# Patient Record
Sex: Female | Born: 1975 | Race: Black or African American | Hispanic: No | Smoking: Former smoker
Health system: Southern US, Community
[De-identification: ages and names within clinical notes are randomized; demographics above are authoritative.]

## PROBLEM LIST (undated history)

## (undated) DIAGNOSIS — G959 Disease of spinal cord, unspecified: Secondary | ICD-10-CM

## (undated) DIAGNOSIS — J189 Pneumonia, unspecified organism: Secondary | ICD-10-CM

## (undated) DIAGNOSIS — M1990 Unspecified osteoarthritis, unspecified site: Secondary | ICD-10-CM

## (undated) DIAGNOSIS — F419 Anxiety disorder, unspecified: Secondary | ICD-10-CM

## (undated) DIAGNOSIS — M5412 Radiculopathy, cervical region: Secondary | ICD-10-CM

## (undated) DIAGNOSIS — M5030 Other cervical disc degeneration, unspecified cervical region: Secondary | ICD-10-CM

## (undated) DIAGNOSIS — I10 Essential (primary) hypertension: Secondary | ICD-10-CM

## (undated) DIAGNOSIS — M797 Fibromyalgia: Secondary | ICD-10-CM

## (undated) DIAGNOSIS — E119 Type 2 diabetes mellitus without complications: Secondary | ICD-10-CM

## (undated) DIAGNOSIS — L501 Idiopathic urticaria: Secondary | ICD-10-CM

## (undated) DIAGNOSIS — G4730 Sleep apnea, unspecified: Secondary | ICD-10-CM

## (undated) DIAGNOSIS — M1380 Other specified arthritis, unspecified site: Secondary | ICD-10-CM

## (undated) DIAGNOSIS — Z9889 Other specified postprocedural states: Secondary | ICD-10-CM

## (undated) DIAGNOSIS — E785 Hyperlipidemia, unspecified: Secondary | ICD-10-CM

## (undated) DIAGNOSIS — M064 Inflammatory polyarthropathy: Secondary | ICD-10-CM

## (undated) DIAGNOSIS — Z8489 Family history of other specified conditions: Secondary | ICD-10-CM

## (undated) DIAGNOSIS — J45909 Unspecified asthma, uncomplicated: Secondary | ICD-10-CM

## (undated) DIAGNOSIS — R112 Nausea with vomiting, unspecified: Secondary | ICD-10-CM

## (undated) DIAGNOSIS — T8859XA Other complications of anesthesia, initial encounter: Secondary | ICD-10-CM

## (undated) DIAGNOSIS — F32A Depression, unspecified: Secondary | ICD-10-CM

## (undated) HISTORY — PX: WISDOM TOOTH EXTRACTION: SHX21

## (undated) HISTORY — PX: ABDOMINAL HYSTERECTOMY: SHX81

## (undated) HISTORY — DX: Inflammatory polyarthropathy: M06.4

## (undated) HISTORY — DX: Unspecified osteoarthritis, unspecified site: M19.90

## (undated) HISTORY — DX: Other specified arthritis, unspecified site: M13.80

## (undated) HISTORY — PX: DILATION AND CURETTAGE OF UTERUS: SHX78

## (undated) HISTORY — DX: Fibromyalgia: M79.7

---

## 1998-09-30 HISTORY — PX: TONSILLECTOMY: SUR1361

## 2014-03-14 DIAGNOSIS — D219 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, unspecified: Secondary | ICD-10-CM

## 2014-03-14 HISTORY — DX: 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, unspecified: D21.9

## 2014-07-25 DIAGNOSIS — F411 Generalized anxiety disorder: Secondary | ICD-10-CM | POA: Insufficient documentation

## 2016-09-30 HISTORY — PX: ABDOMINAL HYSTERECTOMY: SHX81

## 2017-09-28 DIAGNOSIS — D569 Thalassemia, unspecified: Secondary | ICD-10-CM

## 2017-09-28 DIAGNOSIS — I10 Essential (primary) hypertension: Secondary | ICD-10-CM | POA: Insufficient documentation

## 2017-09-28 DIAGNOSIS — J45909 Unspecified asthma, uncomplicated: Secondary | ICD-10-CM | POA: Insufficient documentation

## 2017-09-28 DIAGNOSIS — E119 Type 2 diabetes mellitus without complications: Secondary | ICD-10-CM | POA: Insufficient documentation

## 2017-09-28 HISTORY — DX: Thalassemia, unspecified: D56.9

## 2017-09-29 DIAGNOSIS — R7989 Other specified abnormal findings of blood chemistry: Secondary | ICD-10-CM | POA: Insufficient documentation

## 2017-09-29 DIAGNOSIS — D72829 Elevated white blood cell count, unspecified: Secondary | ICD-10-CM | POA: Insufficient documentation

## 2019-10-11 DIAGNOSIS — M48061 Spinal stenosis, lumbar region without neurogenic claudication: Secondary | ICD-10-CM | POA: Insufficient documentation

## 2019-10-11 DIAGNOSIS — E785 Hyperlipidemia, unspecified: Secondary | ICD-10-CM | POA: Insufficient documentation

## 2019-10-11 DIAGNOSIS — F3341 Major depressive disorder, recurrent, in partial remission: Secondary | ICD-10-CM | POA: Insufficient documentation

## 2019-10-11 DIAGNOSIS — Z794 Long term (current) use of insulin: Secondary | ICD-10-CM | POA: Insufficient documentation

## 2019-10-11 DIAGNOSIS — L501 Idiopathic urticaria: Secondary | ICD-10-CM | POA: Insufficient documentation

## 2019-10-11 DIAGNOSIS — M5136 Other intervertebral disc degeneration, lumbar region: Secondary | ICD-10-CM | POA: Insufficient documentation

## 2019-10-11 DIAGNOSIS — M1990 Unspecified osteoarthritis, unspecified site: Secondary | ICD-10-CM | POA: Insufficient documentation

## 2019-10-11 DIAGNOSIS — E119 Type 2 diabetes mellitus without complications: Secondary | ICD-10-CM | POA: Insufficient documentation

## 2020-06-02 ENCOUNTER — Other Ambulatory Visit: Payer: Self-pay

## 2020-06-02 ENCOUNTER — Telehealth: Payer: Self-pay | Admitting: Registered Nurse

## 2020-06-02 ENCOUNTER — Ambulatory Visit: Payer: Self-pay | Admitting: Medical

## 2020-06-02 ENCOUNTER — Encounter: Payer: Self-pay | Admitting: Registered Nurse

## 2020-06-02 ENCOUNTER — Ambulatory Visit: Payer: Self-pay

## 2020-06-02 VITALS — HR 95 | Resp 16

## 2020-06-02 DIAGNOSIS — J45901 Unspecified asthma with (acute) exacerbation: Secondary | ICD-10-CM

## 2020-06-02 DIAGNOSIS — F411 Generalized anxiety disorder: Secondary | ICD-10-CM

## 2020-06-02 DIAGNOSIS — Z794 Long term (current) use of insulin: Secondary | ICD-10-CM

## 2020-06-02 DIAGNOSIS — F3341 Major depressive disorder, recurrent, in partial remission: Secondary | ICD-10-CM

## 2020-06-02 DIAGNOSIS — Z20822 Contact with and (suspected) exposure to covid-19: Secondary | ICD-10-CM

## 2020-06-02 DIAGNOSIS — O24419 Gestational diabetes mellitus in pregnancy, unspecified control: Secondary | ICD-10-CM | POA: Insufficient documentation

## 2020-06-02 DIAGNOSIS — J019 Acute sinusitis, unspecified: Secondary | ICD-10-CM

## 2020-06-02 DIAGNOSIS — E785 Hyperlipidemia, unspecified: Secondary | ICD-10-CM

## 2020-06-02 DIAGNOSIS — I10 Essential (primary) hypertension: Secondary | ICD-10-CM

## 2020-06-02 DIAGNOSIS — E119 Type 2 diabetes mellitus without complications: Secondary | ICD-10-CM

## 2020-06-02 LAB — POC COVID19 BINAXNOW: SARS Coronavirus 2 Ag: NEGATIVE

## 2020-06-02 MED ORDER — LISINOPRIL 10 MG PO TABS
10.0000 mg | ORAL_TABLET | Freq: Every day | ORAL | 0 refills | Status: DC
Start: 2020-06-02 — End: 2022-07-16

## 2020-06-02 MED ORDER — SALINE SPRAY 0.65 % NA SOLN: 2.0000 | NASAL | 0 refills | Status: DC

## 2020-06-02 MED ORDER — FLUTICASONE PROPIONATE 50 MCG/ACT NA SUSP
1.0000 | Freq: Two times a day (BID) | NASAL | 0 refills | Status: DC
Start: 2020-06-02 — End: 2021-11-12

## 2020-06-02 MED ORDER — SERTRALINE HCL 100 MG PO TABS: 200.0000 mg | ORAL_TABLET | Freq: Every day | ORAL | 0 refills | Status: DC

## 2020-06-02 MED ORDER — ATORVASTATIN CALCIUM 40 MG PO TABS
40.0000 mg | ORAL_TABLET | Freq: Every day | ORAL | 0 refills | Status: DC
Start: 2020-06-02 — End: 2024-03-19

## 2020-06-02 MED ORDER — TRULICITY 0.75 MG/0.5ML ~~LOC~~ SOAJ
0.7500 mg | SUBCUTANEOUS | 0 refills | Status: DC
Start: 2020-06-02 — End: 2021-01-19

## 2020-06-02 NOTE — Progress Notes (Signed)
Subjective:    Patient ID: Eileen Chaney, female    DOB: Jul 22, 1976, 44 y.o.   MRN: [id number]  44y/o female new patient here for covid testing due to symptoms chest tightness, rhinitis, swollen glands, feeling hot, chills, and body aches.  Fully covid vaccinated second pfizer vaccine 12/27/2019 per patient.  Went to UC yesterday due to leg swelling denied fever/rash stated swelling was making it difficult to walk.  Provider felt it was due to hyperglycemia and refilled her metformin but not her trulicity stating provider could not fill injectables.  Patient reported she is also running low on lisinopril, atorvastatin and zoloft and would like refills today if possible.  Labs last completed with [facility] Jan 2021 [facility].  Last Hgba1c 7.4  Not using her continuous glucose monitor as not getting accurate readings per patient. She has not established care with new PCM after starting new job at [employer].  She was scheduled to receive her covid booster vaccine today dose 3 and she is wondering if she should reschedule.  Patient did not take temperature at home could not find thermometer due to recently moved still unpacking.  Felt like she had fever.  Feeling hot/cold chills.  Denied thoughts of HI/SI states her zoloft has been working well and wondering if she can stop it now as not feeling depressed/anxious.  Denied PMHx gout, insect bite, trauma to right leg, clothes touching swollen leg not painful, no rash observed, skin warm dry and pink and only one leg affected.  Patient had rapid POC covid [employer] testing with RN Maisie Fus in parking lot prior to telehealth visit with me.  Per RN Maisie Fus A&Ox3, respirations even and unlabored skin warm dry and pink and patient easily performed nasal swab for covid testing coordination intact.  Per RN Maisie Fus patient out of car in parking lot but she did not observe gait.     Review of Systems  Constitutional: Positive for chills and fever. Negative  for activity change, appetite change, diaphoresis and fatigue.  HENT: Positive for postnasal drip and rhinorrhea. Negative for facial swelling, trouble swallowing and voice change.   Eyes: Negative for photophobia and visual disturbance.  Respiratory: Positive for cough and chest tightness. Negative for shortness of breath, wheezing and stridor.   Cardiovascular: Positive for leg swelling.  Gastrointestinal: Negative for diarrhea, nausea and vomiting.  Endocrine: Negative for polydipsia, polyphagia and polyuria.  Genitourinary: Negative for difficulty urinating.  Musculoskeletal: Positive for myalgias. Negative for arthralgias, gait problem, neck pain and neck stiffness.  Skin: Negative for color change, rash and wound.  Allergic/Immunologic: Positive for immunocompromised state. Negative for food allergies.  Neurological: Negative for dizziness, tremors, seizures, syncope, facial asymmetry, speech difficulty, weakness, light-headedness, numbness and headaches.  Hematological: Positive for adenopathy.  Psychiatric/Behavioral: Negative for agitation, confusion and sleep disturbance.       Objective:   Physical Exam Vitals and nursing note reviewed.  Constitutional:      General: She is awake. She is not in acute distress.    Appearance: Normal appearance. She is well-developed and well-groomed. She is morbidly obese. She is not ill-appearing, toxic-appearing or diaphoretic.  HENT:     Head: Normocephalic and atraumatic.     Jaw: There is normal jaw occlusion.     Right Ear: Hearing and external ear normal.     Left Ear: Hearing and external ear normal.     Nose: Nose normal.     Mouth/Throat:     Mouth: No angioedema.  Pharynx: Oropharynx is clear.  Eyes:     General: Lids are normal. Vision grossly intact. Gaze aligned appropriately. No scleral icterus.       Right eye: No discharge.        Left eye: No discharge.     Extraocular Movements: Extraocular movements intact.      Conjunctiva/sclera: Conjunctivae normal.     Pupils: Pupils are equal, round, and reactive to light.  Neck:     Trachea: Trachea and phonation normal.  Cardiovascular:     Rate and Rhythm: Normal rate and regular rhythm.  Pulmonary:     Effort: Pulmonary effort is normal. No respiratory distress.     Breath sounds: Normal breath sounds and air entry. No stridor or transmitted upper airway sounds. No wheezing.     Comments: Spoke full sentences without difficulty; no cough audible during telephone conversation Musculoskeletal:        General: No deformity or signs of injury. Normal range of motion.     Right shoulder: No swelling, deformity or laceration. Normal range of motion.     Left shoulder: No swelling, deformity or laceration. Normal range of motion.     Right elbow: No swelling, deformity or lacerations. Normal range of motion.     Left elbow: No swelling, deformity or lacerations. Normal range of motion.     Right hand: No swelling, deformity or lacerations. Normal range of motion.     Left hand: No swelling, deformity or lacerations. Normal range of motion.     Cervical back: Normal range of motion. No swelling, edema, deformity, erythema, signs of trauma, lacerations or rigidity. Normal range of motion.     Thoracic back: No swelling, edema, deformity, signs of trauma or lacerations. Normal range of motion.  Skin:    General: Skin is warm and dry.     Coloration: Skin is not ashen, cyanotic, jaundiced, mottled, pale or sallow.     Findings: No abrasion, abscess, acne, bruising, burn, ecchymosis, erythema, signs of injury, laceration, lesion, petechiae, rash or wound.     Nails: There is no clubbing.  Neurological:     General: No focal deficit present.     Mental Status: She is alert and oriented to person, place, and time. Mental status is at baseline.     GCS: GCS eye subscore is 4. GCS verbal subscore is 5. GCS motor subscore is 6.     Cranial Nerves: No cranial nerve  deficit, dysarthria or facial asymmetry.     Motor: Motor function is intact. No weakness, tremor, atrophy, abnormal muscle tone or seizure activity.     Coordination: Coordination is intact. Coordination normal.     Comments: Bilateral hand grasp equal  Psychiatric:        Attention and Perception: Attention and perception normal.        Mood and Affect: Mood and affect normal.        Speech: Speech normal.        Behavior: Behavior normal. Behavior is cooperative.        Thought Content: Thought content normal.        Cognition and Memory: Cognition and memory normal.        Judgment: Judgment normal.      Specimen:  Blood  Ref Range & Units 7 mo ago Comments  Hemoglobin A1c 4.8 - 5.6 % 7.4High      Prediabetes: 5.7 - 6.4      Diabetes: >6.4  Glycemic control for adults with diabetes: <7.0      Resulting Agency  [facility]   Narrative Performed by [facility] Performed at: [address]  [address], [place], [place] [id number]  Lab Director: Rush Farmer MD, Phone: [phone] Specimen Collected: 10/11/19 9:30 AM Last Resulted: 10/12/19 7:37 AM  Received From: [facility]  Result Received: 06/02/20 10:06 AM   Specimen:  Blood  Ref Range & Units 6 mo ago Comments  Na 136 - 146 mmol/L 140        Potassium 3.7 - 5.4 mmol/L 4.1        Cl 97 - 108 mmol/L 103        CO2 20 - 32 mmol/L 26        Glucose 65 - 99 mg/dL 97        BUN 6 - 24 mg/dL 15        Creatinine 0.57 - 1.00 mg/dL 0.73        Ca 8.7 - 10.2 mg/dL 9.9        ALK PHOS 25 - 150 U/L 136        T Bili 0.00 - 1.20 mg/dL 0.19        Total Protein 6.0 - 8.5 gm/dL 8.2        Alb 3.5 - 5.5 gm/dL 4.6        GLOBULIN 1.5 - 4.5 gm/dL 3.6        ALBUMIN/GLOBULIN RATIO 1.1 - 2.5  1.3        BUN/CREAT RATIO 11.0 - 26.0  20.5        ALT 0 - 40 U/L 14        AST 0 - 40 U/L 14        GFR AFRICAN AMERICAN mL/min/1.5m2 115  African-American:  Normal GFR (glomerular  filtration rate) > 60 mL/min/1.73 meters squared. < 60 may include impaired kidney function based on creatinine, age, legal sex, and race normalized to accepted average body surface area      GFR Non African American mL/min/1.62m2 100  Non African American:  Normal GFR (glomerular filtration rate) > 60 mL/min/1.73 meters squared. < 60 may include impaired kidney function based on creatinine, age, legal sex, and race normalized to accepted average body surface area.      AGAP 7 - 16 mmol/L 11        Resulting Agency  [facility]   Specimen Collected: 11/18/19 11:33 PM Last Resulted: 11/19/19 12:09 AM  Received From: [facility]  Result Received: 06/02/20 10:06 AM  Encounter Summary  Recent Data from [facility] to Comprehensive Metabolic Panel Component 17/61/60 10/11/19  Na 140 --  Potassium 4.1 4.4  Cl 103 --  CO2 26 21  Glucose 97 103High  BUN 15 10  Creatinine 0.73 0.72  Ca 9.9 --  ALK PHOS 136 --  T Bili 0.19 --  Total Protein 8.2 7.4  Alb 4.6 --  GLOBULIN 3.6 --  ALBUMIN/GLOBULIN RATIO 1.3 --  BUN/CREAT RATIO 20.5 --  ALT 14 --  AST 14 19  GFR AFRICAN AMERICAN 115  --  GFR Non African American 100  --  AGAP 11 --  ALT (SGPT) -- 12  Albumin, Serum -- 4.4  Albumin/Globulin Ratio -- 1.5  Alkaline Phosphatase -- 140High  BUN/Creatinine Ratio -- 14  CALCIUM -- 9.7  Chloride -- 103  Globulin, Total -- 3.0  Sodium -- 138  Total Bilirubin -- 0.3  eGFR  If African American -- 117  eGFR If NonAfrican American -- 101    Ref Range & Units 7 mo ago  Cholesterol, Total 100 - 199 mg/dL 241High      Triglycerides 0 - 149 mg/dL 114      HDL >39 mg/dL 50      VLDL Cholesterol Cal 5 - 40 mg/dL 20      LDL 0 - 99 mg/dL 171High      Resulting Agency  [facility]  Narrative Performed by [facility] Performed at: [address]  [address], [place], [place] [id number]  Lab Director: Rush Farmer MD, Phone: [phone] Specimen  Collected: 10/11/19 9:30 AM Last Resulted: 10/12/19 7:37 AM  Received From: [facility]  Result Received: 06/02/20 10:06 AM         Assessment & Plan:  A-covid testing, acute rhinosinusitis, hyperlipidemia, hypertension, type II diabetes with insulin use, GAD, depression major, mild intermittent asthma with exacerbation, leg swelling, morbid obesity  P-Patient notified via telephone negative Covid rapid test result.  PCR test ordered, collected under observation by RN Maisie Fus and sent to [place].  Patient to continue quarantine at home due to symptoms, do not return to work at this time as possible fever and URI symptoms in past 24 hours.  Fully vaccinated.  Continue to wear mask when out in public and [place] policy to wear while at work.  Refer to [employer] if further questions regarding covid prevention policies when on [employer].  Monitor for symptoms of covid e.g. runny nose, sore throat, headache, loss of taste/smell, nausea/vomiting/diarrhea, fever/chills, body aches, cough, shortness of breath/dyspnea.  Patient to notify clinic staff if new symptoms develop.  Discussed repeat testing may be indicated if new symptoms develop.  Discussed current research has shown that breakthrough covid infections are occurring with the delta variant but typically do not require hospitalization for fully vaccinated immunocompetent patients.  Discussed with patient expected 8 month covid booster vaccinatations to start late September 2021 but she will not be due until end of November/beginning of December with current guidance.  Patient reported she had already scheduled her covid booster for today but wondering if she needs to reschedule.  Discussed with patient if fever greater than 100.5/chills it is recommended she reschedule vaccination to a day she is afebrile.  Patient has been unable to find thermometer at home so hasn't actually had known fever.  Discussed with patient if feeling hot  this weekend to take her temperature with thermoeter.  Allergic to tylenol may use OTC motrin $RemoveBe'800mg'ofUQiRMXP$  po BID prn pain take with food.  NSAIDS can counteract her blood pressure medication and can cause kidney strain so to use sparingly. Discussed with patient covid spread and incidence in community high.  Continue to protect self with mask wear, hand sanitizing washing, avoid touching face and maintaining social distancing 6 feet or greater.  Exitcare handouts on covid FAQ and covid testing. If patient has any further questions or concerns she can send me message via mychart also.  [facility] open M-F.  Patient verbalized understanding of information/instructions, agreed with plan of care and had no further questions at this time.    Patient may use normal saline nasal spray 2 sprays each nostril q2h wa as needed. flonase 78mcg 1 spray each nostril BID #1 RF0.  Patient denied personal or family history of ENT cancer.  OTC antihistamine of choice zyrtec $RemoveBefor'10mg'hTrzRkEroucN$  po daily.  Avoid triggers if possible.  Shower prior to  bedtime if exposed to triggers.  If allergic dust/dust mites recommend mattress/pillow covers/encasements; washing linens, vacuuming, sweeping, dusting weekly.  Call or return to clinic as needed if these symptoms worsen or fail to improve as anticipated.   Exitcare handouts on allergic rhinitis, nonallergic rhinitis and sinus rinse.  Patient verbalized understanding of instructions, agreed with plan of care and had no further questions at this time.  P2:  Avoidance and hand washing.  Some cough nonproductive and chest tightness prior to cough that relieves with cough.  Consider prednisone burst 10mg  sig 3 tabs daily po with breakfast x 4 days as history of mild intermittent asthma and type II diabetes lower prednisone dose.  Post nasal drip irritation? bronchitis  Discussed possible side effects increased/decreased appetite, difficulty sleeping, increased blood sugar, increased blood  pressure and heart rate.  Take advair diskus 100/37mcg 1 puff BID on regular schedule not prn this week at home doesn't need refill.  Albuterol MDI 17mcg 1-2 puffs po q4-6h prn protracted cough/wheeze (at home doesn't have refill) side effect increased heart rate/hand tremors. Bronchitis simple, community acquired, may have started as viral (probably respiratory syncytial, parainfluenza, influenza, or adenovirus), but now evidence of acute purulent bronchitis with resultant bronchial edema and mucus formation.  Viruses are the most common cause of bronchial inflammation in otherwise healthy adults with acute bronchitis.  The appearance of sputum is not predictive of whether a bacterial infection is present.  Purulent sputum is most often caused by viral infections.  There are a small portion of those caused by non-viral agents being Mycoplama pneumonia.  Microscopic examination or C&S of sputum in the healthy adult with acute bronchitis is generally not helpful (usually negative or normal respiratory flora) other considerations being cough from upper respiratory tract infections, sinusitis or allergic syndromes (mild asthma or viral pneumonia).  Differential Diagnoses:  reactive airway disease (asthma, allergic aspergillosis (eosinophilia), chronic bronchitis, respiratory infection (sinusitis, common cold, pneumonia), congestive heart failure, reflux esophagitis, bronchogenic tumor, aspiration syndromes and/or exposure to pulmonary irritants/smoke.  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  Without high fever, severe dyspnea, lack of physical findings or other risk factors, I will hold on a chest radiograph and CBC at this time.  I discussed that approximately 50% of patients with acute bronchitis have a cough that lasts up to three weeks, and 25% for over a month.  Exitcare handouts on asthma attack prevention and inhaler use.  ER if hemopthysis, SOB, worst chest pain of  life.   Patient instructed to follow up if symptoms worsen.  Patient verbalized agreement and understanding of treatment plan and had no further questions at this time.  P2:  hand washing and cover cough  Continue atorvastatin 40mg  po daily #90 RF0 sent electronically to his pharmacy of choice today.   Discussed need to establish care with new PCM and annual labs due Jan 2022 for lipids.  Patient stopped omega 3/fish oil supplement.  Encouraged lifestyle modification re diet to decrease white starches/sugars e.g. potatoes/bread increase omega 3 sources nuts, fish options add more fruits and vegetables and lean protein choices such as hummus or yogurt. Exitcare handout on high cholesterol. Consider weight loss and increased dietary fiber.  Patient agreed with plan of care and verbalized understanding of information/instructions and had no further questions at this time.   Continue current medications as directed. Electronic Rx lisinopril 10mg  po daily #90 RF0 to her pharmacy of choice.  Establish care with  new PCM.  Consider weight loss.  Consider low sodium/DASH diet and exercise program 150 minutes exercise/activity per week.  Recommend weight loss  Reurn to the clinic if any new symptoms/notify clinic staff if visual changes, frequent headache, chest pain or dyspnea on mild or  minimal exertion. Exitcare handout on managing hypertension.  Patient verbalized agreement and understanding of treatment plan and had no further questions at this time. P2: Diet and Exercise specific for HTN  Refilled zoloft 100mg  take 2 tabs po daily #90 RF0 electronic Rx to her pharmacy of choice.  Establish with new PCM.  Do not stop medication cold [place] must be tapered off.  Patient stated anxiety/depression under good control, does not cause problems at work and able to complete all duties. She is thinking she may be able to stop her medication.  Denied S/I or H/I, plan, or ideation. Patient is reliable. Exitcare handouts on  living with depression and anxiety. Return to the clinic if any new or worsening symptoms. Patient verbalized understanding of information/instructions, agreed with plan of care and had no further questions at this time. P2: Diet and Exercise. Stress reduction.  Follow up overdue for Hgba1c (previous PCM wanted to see her again in April 2021 last appt Jan 2021  Hgba1c 7.4.)  Needs microalbumin and repeat Hgba1c.   Normal renal function, alk phos slightly elevated and history of hepatic steatosis on CT abdomen per chart review. Diabetic eye exam due Aug 2021.  Urgent care recently refilled her metformin but would not refill her injectable trulicity 0.75mg  weekly.  Electronic Rx sent to her pharmacy of choice today.  Patient had stopped using continuous glucose monitor as not working well for her.  To discuss with new PCM other options.  I recommend weight loss over the next year but emphasized healthy diet and activity daily main goals.  Continue metformin XR 500mg   Take 4 tabs po daily. Ensure staying hydrated drink water to keep urine pale yellow clear and voiding every 4 hours when awake.  Avoid dehydration. Avoid added sugars in diet/non-nutritional greater than 25gms/5 teaspoons per day.  Exitcare handout on diabetes sick day management.  Notify optometry provider diabetes diagnosis.  Also notify dentist of diabetes diagnosis as more frequent cleanings/services may be covered by insurance with diabetes.  Apply emollient to feet each night e.g. vaseline/aquaphor/eucerin (squeeze or scoop container not pump as pump has alcohol in it and more drying).  Do visual foot inspection every day when putting on socks/shoes check for redness/sores.  Discussed high blood sugars can affect nerve function/altered sensation and may not noticed hot spots (red areas)/blisters or cuts especially between toes.  Patient verbalized understanding information/instructions, agreed with plan of care and had no further questions at  this time.  Elevate leg when sitting monitor for redness, rash, worsening swelling or pain.  Consider compression stockings.  Seek same day re-evaluation if these symptoms occur red streaks, rash, new or worsening pain/swelling not improving with plan of care.  DDx gout, cellulitis, DVT, varicose veins  Exitcare handouts on DVT, leg swelling. Previous leg [place] negative for DVT on epic care everywhere review. Patient verbalized understanding information/instructions and had no further questions at this time.

## 2020-06-02 NOTE — Patient Instructions (Addendum)
How to Use a Metered Dose Inhaler A metered dose inhaler is a handheld device for taking medicine tha e can be used to deliver a variety of inhaled medicines, including:  Quick relief or rescue medicines, such as bronchodilators.  Controller medicines, such as corticosteroids. The medicine is delivered by pushing down on a metal canister to release a preset amount of spray and medicine. Each device contains the amount of medicine that is needed for a preset number of uses (inhalations). Your health care provider may recommend that you use a spacer with your inhaler to help you take the medicine more effectively. A spacer is a plastic tube with a mouthpiece on one end and an opening that connects to the inhaler on the other end. A spacer holds the medicine in a tube for a short time, which allows you to inhale more medicine. What are the risks? If you do not use your inhaler correctly, medicine might not reach your lungs to help you breathe. Inhaler medicine can cause side effects, such as:  Mouth or throat infection.  Cough.  Hoarseness.  Headache.  Nausea and vomiting.  Lung infection (pneumonia) in people who have a lung condition called COPD. How to use a metered dose inhaler without a spacer  1. Remove the cap from the inhaler. 2. If you are using the inhaler for the first time, shake it for 5 seconds, turn it away from your face, then release 4 puffs into the air. This is called priming. 3. Shake the inhaler for 5 seconds. 4. Position the inhaler so the top of the canister faces up. 5. Put your index finger on the top of the medicine canister. Support the bottom of the inhaler with your thumb. 6. Breathe out normally and as completely as possible, away from the inhaler. 7. Either place the inhaler between your teeth and close your lips tightly around the mouthpiece, or hold the inhaler 1-2 inches (2.5-5 cm) away from your open mouth. Keep your tongue down out of the way. If you  are unsure which technique to use, ask your health care provider. 8. Press the canister down with your index finger to release the medicine, then inhale deeply and slowly through your mouth (not your nose) until your lungs are completely filled. Inhaling should take 4-6 seconds. 9. Hold the medicine in your lungs for 5-10 seconds (10 seconds is best). This helps the medicine get into the small airways of your lungs. 10. With your lips in a tight circle (pursed), breathe out slowly. 11. Repeat steps 3-10 until you have taken the number of puffs that your health care provider directed. Wait about 1 minute between puffs or as directed. 12. Put the cap on the inhaler. 13. If you are using a steroid inhaler, rinse your mouth with water, gargle, and spit out the water. Do not swallow the water. How to use a metered dose inhaler with a spacer  1. Remove the cap from the inhaler. 2. If you are using the inhaler for the first time, shake it for 5 seconds, turn it away from your face, then release 4 puffs into the air. This is called priming. 3. Shake the inhaler for 5 seconds. 4. Place the open end of the spacer onto the inhaler mouthpiece. 5. Position the inhaler so the top of the canister faces up and the spacer mouthpiece faces you. 6. Put your index finger on the top of the medicine canister. Support the bottom of the inhaler and the  spacer with your thumb. 7. Breathe out normally and as completely as possible, away from the spacer. 8. Place the spacer between your teeth and close your lips tightly around it. Keep your tongue down out of the way. 9. Press the canister down with your index finger to release the medicine, then inhale deeply and slowly through your mouth (not your nose) until your lungs are completely filled. Inhaling should take 4-6 seconds. 10. Hold the medicine in your lungs for 5-10 seconds (10 seconds is best). This helps the medicine get into the small airways of your  lungs. 11. With your lips in a tight circle (pursed), breathe out slowly. 12. Repeat steps 3-11 until you have taken the number of puffs that your health care provider directed. Wait about 1 minute between puffs or as directed. 13. Remove the spacer from the inhaler and put the cap on the inhaler. 14. If you are using a steroid inhaler, rinse your mouth with water, gargle, and spit out the water. Do not swallow the water. Follow these instructions at home:  Take your inhaled medicine only as told by your health care provider. Do not use the inhaler more than directed by your health care provider.  Keep all follow-up visits as told by your health care provider. This is important.  If your inhaler has a counter, you can check it to determine how full your inhaler is. If your inhaler does not have a counter, ask your health care provider when you will need to refill your inhaler and write the refill date on a calendar or on your inhaler canister. Note that you cannot know when an inhaler is empty by shaking it.  Follow directions on the package insert for care and cleaning of your inhaler and spacer. Contact a health care provider if:  Symptoms are only partially relieved with your inhaler.  You are having trouble using your inhaler.  You have an increase in phlegm.  You have headaches. Get help right away if:  You feel little or no relief after using your inhaler.  You have dizziness.  You have a fast heart rate.  You have chills or a fever.  You have night sweats.  There is blood in your phlegm. Summary  A metered dose inhaler is a handheld device for taking medicine that must be breathed into the lungs (inhaled).  The medicine is delivered by pushing down on a metal canister to release a preset amount of spray and medicine.  Each device contains the amount of medicine that is needed for a preset number of uses (inhalations). This information is not intended to replace  advice given to you by your health care provider. Make sure you discuss any questions you have with your health care provider. Document Revised: 08/29/2017 Document Reviewed: 08/06/2016 Elsevier Patient Education  2020 [place].  COVID-19: What Your Test Results Mean If you test positive for COVID-19 Take steps to help prevent the spread of COVID-19 Stay home.  Do not leave your home, except to get medical care. Do not visit public areas. Get rest and stay hydrated. Take over-the-counter medicines, such as acetaminophen, to help you feel better. Stay in touch with your doctor. Separate yourself from other people.  As much as possible, stay in a specific room and away from other people and pets in your home. If you test negative for COVID-19  You probably were not infected at the time your sample was collected.  However, that does not mean  you will not get sick.  It is possible that you were very early in your infection when your sample was collected and that you could test positive later. A negative test result does not mean you won't get sick later. [email or web address] 02/27/2019 This information is not intended to replace advice given to you by your health care provider. Make sure you discuss any questions you have with your health care provider. Document Revised: 09/02/2019 Document Reviewed: 09/02/2019 Elsevier Patient Education  [place].  COVID-19 Frequently Asked Questions COVID-19 (coronavirus disease) is an infection that is caused by a large family of viruses. Some viruses cause illness in people and others cause illness in animals like camels, cats, and bats. In some cases, the viruses that cause illness in animals can spread to humans. Where did the coronavirus come from? In December 2019, [place] told the [facility]) of several cases of lung disease (human respiratory illness). These cases were linked to an open seafood and livestock market  in the city of [place]. The link to the seafood and livestock market suggests that the virus may have spread from animals to humans. However, since that first outbreak in December, the virus has also been shown to spread from person to person. What is the name of the disease and the virus? Disease name Early on, this disease was called novel coronavirus. This is because scientists determined that the disease was caused by a new (novel) respiratory virus. The World Health Organization [facility]) has now named the disease COVID-19, or coronavirus disease. Virus name The virus that causes the disease is called severe acute respiratory syndrome coronavirus 2 (SARS-CoV-2). More information on disease and virus naming World Health Organization [facility]): www.who.int/emergencies/diseases/novel-coronavirus-2019/technical-guidance/naming-the-coronavirus-disease-(covid-2019)-and-the-virus-that-causes-it Who is at risk for complications from coronavirus disease? Some people may be at higher risk for complications from coronavirus disease. This includes older adults and people who have chronic diseases, such as heart disease, diabetes, and lung disease. If you are at higher risk for complications, take these extra precautions:  Stay home as much as possible.  Avoid social gatherings and travel.  Avoid close contact with others. Stay at least 6 ft (2 m) away from others, if possible.  Wash your hands often with soap and water for at least 20 seconds.  Avoid touching your face, mouth, nose, or eyes.  Keep supplies on hand at home, such as food, medicine, and cleaning supplies.  If you must go out in public, wear a cloth face covering or face mask. Make sure your mask covers your nose and mouth. How does coronavirus disease spread? The virus that causes coronavirus disease spreads easily from person to person (is contagious). You may catch the virus by:  Breathing in droplets from an infected person. Droplets can  be spread by a person breathing, speaking, singing, coughing, or sneezing.  Touching something, like a table or a doorknob, that was exposed to the virus (contaminated) and then touching your mouth, nose, or eyes. Can I get the virus from touching surfaces or objects? There is still a lot that we do not know about the virus that causes coronavirus disease. Scientists are basing a lot of information on what they know about similar viruses, such as:  Viruses cannot generally survive on surfaces for long. They need a human body (host) to survive.  It is more likely that the virus is spread by close contact with people who are sick (direct contact), such as through: ? Shaking hands or hugging. ? Breathing  in respiratory droplets that travel through the air. Droplets can be spread by a person breathing, speaking, singing, coughing, or sneezing.  It is less likely that the virus is spread when a person touches a surface or object that has the virus on it (indirect contact). The virus may be able to enter the body if the person touches a surface or object and then touches his or her face, eyes, nose, or mouth. Can a person spread the virus without having symptoms of the disease? It may be possible for the virus to spread before a person has symptoms of the disease, but this is most likely not the main way the virus is spreading. It is more likely for the virus to spread by being in close contact with people who are sick and breathing in the respiratory droplets spread by a person breathing, speaking, singing, coughing, or sneezing. What are the symptoms of coronavirus disease? Symptoms vary from person to person and can range from mild to severe. Symptoms may include:  Fever or chills.  Cough.  Difficulty breathing or feeling short of breath.  Headaches, body aches, or muscle aches.  Runny or stuffy (congested) nose.  Sore throat.  New loss of taste or smell.  Nausea, vomiting, or  diarrhea. These symptoms can appear anywhere from 2 to 14 days after you have been exposed to the virus. Some people may not have any symptoms. If you develop symptoms, call your health care provider. People with severe symptoms may need hospital care. Should I be tested for this virus? Your health care provider will decide whether to test you based on your symptoms, history of exposure, and your risk factors. How does a health care provider test for this virus? Health care providers will collect samples to send for testing. Samples may include:  Taking a swab of fluid from the back of your nose and throat, your nose, or your throat.  Taking fluid from the lungs by having you cough up mucus (sputum) into a sterile cup.  Taking a blood sample. Is there a treatment or vaccine for this virus? Currently, there is no vaccine to prevent coronavirus disease. Also, there are no medicines like antibiotics or antivirals to treat the virus. A person who becomes sick is given supportive care, which means rest and fluids. A person may also relieve his or her symptoms by using over-the-counter medicines that treat sneezing, coughing, and runny nose. These are the same medicines that a person takes for the common cold. If you develop symptoms, call your health care provider. People with severe symptoms may need hospital care. What can I do to protect myself and my family from this virus?     You can protect yourself and your family by taking the same actions that you would take to prevent the spread of other viruses. Take the following actions:  Wash your hands often with soap and water for at least 20 seconds. If soap and water are not available, use alcohol-based hand sanitizer.  Avoid touching your face, mouth, nose, or eyes.  Cough or sneeze into a tissue, sleeve, or elbow. Do not cough or sneeze into your hand or the air. ? If you cough or sneeze into a tissue, throw it away immediately and wash your  hands.  Disinfect objects and surfaces that you frequently touch every day.  Stay away from people who are sick.  Avoid going out in public, follow guidance from your state and local health authorities.  Avoid  crowded indoor spaces. Stay at least 6 ft (2 m) away from others.  If you must go out in public, wear a cloth face covering or face mask. Make sure your mask covers your nose and mouth.  Stay home if you are sick, except to get medical care. Call your health care provider before you get medical care. Your health care provider will tell you how long to stay home.  Make sure your vaccines are up to date. Ask your health care provider what vaccines you need. What should I do if I need to travel? Follow travel recommendations from your local health authority, the CDC, and WHO. Travel information and advice  Centers for Disease Control and Prevention (CDC): BodyEditor.hu  World Health Organization [facility]): ThirdIncome.ca Know the risks and take action to protect your health  You are at higher risk of getting coronavirus disease if you are traveling to areas with an outbreak or if you are exposed to travelers from areas with an outbreak.  Wash your hands often and practice good hygiene to lower the risk of catching or spreading the virus. What should I do if I am sick? General instructions to stop the spread of infection  Wash your hands often with soap and water for at least 20 seconds. If soap and water are not available, use alcohol-based hand sanitizer.  Cough or sneeze into a tissue, sleeve, or elbow. Do not cough or sneeze into your hand or the air.  If you cough or sneeze into a tissue, throw it away immediately and wash your hands.  Stay home unless you must get medical care. Call your health care provider or local health authority before you get medical care.  Avoid public  areas. Do not take public transportation, if possible.  If you can, wear a mask if you must go out of the house or if you are in close contact with someone who is not sick. Make sure your mask covers your nose and mouth. Keep your home clean  Disinfect objects and surfaces that are frequently touched every day. This may include: ? Counters and tables. ? Doorknobs and light switches. ? Sinks and faucets. ? Electronics such as phones, remote controls, keyboards, computers, and tablets.  Wash dishes in hot, soapy water or use a dishwasher. Air-dry your dishes.  Wash laundry in hot water. Prevent infecting other household members  Let healthy household members care for children and pets, if possible. If you have to care for children or pets, wash your hands often and wear a mask.  Sleep in a different bedroom or bed, if possible.  Do not share personal items, such as razors, toothbrushes, deodorant, combs, brushes, towels, and washcloths. Where to find more information Centers for Disease Control and Prevention (CDC)  Information and news updates: https://www.butler-gonzalez.com/ World Health Organization [facility])  Information and news updates: MissExecutive.com.ee  Coronavirus health topic: https://www.castaneda.info/  Questions and answers on COVID-19: OpportunityDebt.at  Global tracker: who.sprinklr.com American Academy of Pediatrics (AAP)  Information for families: www.healthychildren.org/English/health-issues/conditions/chest-lungs/Pages/2019-Novel-Coronavirus.aspx The coronavirus situation is changing rapidly. Check your local health authority website or the CDC and [facility] websites for updates and news. When should I contact a health care provider?  Contact your health care provider if you have symptoms of an infection, such as fever or cough, and you: ? Have been near anyone who is known to have  coronavirus disease. ? Have come into contact with a person who is suspected to have coronavirus disease. ? Have traveled to an  area where there is an outbreak of COVID-19. When should I get emergency medical care?  Get help right away by calling your local emergency services (911 in the [place] if you have: ? Trouble breathing. ? Pain or pressure in your chest. ? Confusion. ? Blue-tinged lips and fingernails. ? Difficulty waking from sleep. ? Symptoms that get worse. Let the emergency medical personnel know if you think you have coronavirus disease. Summary  A new respiratory virus is spreading from person to person and causing COVID-19 (coronavirus disease).  The virus that causes COVID-19 appears to spread easily. It spreads from one person to another through droplets from breathing, speaking, singing, coughing, or sneezing.  Older adults and those with chronic diseases are at higher risk of disease. If you are at higher risk for complications, take extra precautions.  There is currently no vaccine to prevent coronavirus disease. There are no medicines, such as antibiotics or antivirals, to treat the virus.  You can protect yourself and your family by washing your hands often, avoiding touching your face, and covering your coughs and sneezes. This information is not intended to replace advice given to you by your health care provider. Make sure you discuss any questions you have with your health care provider. Document Revised: 07/16/2019 Document Reviewed: 01/12/2019 Elsevier Patient Education  [place] Prevention, Adult Although you may not be able to control the fact that you have asthma, you can take actions to prevent episodes of asthma (asthma attacks). These actions include:  Creating a written plan for managing and treating your asthma attacks (asthma action plan).  Monitoring your asthma.  Avoiding things that can irritate your airways or make your  asthma symptoms worse (asthma triggers).  Taking your medicines as directed.  Acting quickly if you have signs or symptoms of an asthma attack. What are some ways to prevent an asthma attack? Create a plan Work with your health care provider to create an asthma action plan. This plan should include:  A list of your asthma triggers and how to avoid them.  A list of symptoms that you experience during an asthma attack.  Information about when to take medicine and how much medicine to take.  Information to help you understand your peak flow measurements.  Contact information for your health care providers.  Daily actions that you can take to control asthma. Monitor your asthma To monitor your asthma:  Use your peak flow meter every morning and every evening for 2-3 weeks. Record the results in a journal. A drop in your peak flow numbers on one or more days may mean that you are starting to have an asthma attack, even if you are not having symptoms.  When you have asthma symptoms, write them down in a journal.  Avoid asthma triggers Work with your health care provider to find out what your asthma triggers are. This can be done by:  Being tested for allergies.  Keeping a journal that notes when asthma attacks occur and what may have contributed to them.  Asking your health care provider whether other medical conditions make your asthma worse. Common asthma triggers include:  Dust.  Smoke. This includes campfire smoke and secondhand smoke from tobacco products.  Pet dander.  Trees, grasses or pollens.  Very cold, dry, or humid air.  Mold.  Foods that contain high amounts of sulfites.  Strong smells.  Engine exhaust and air pollution.  Aerosol sprays and fumes from household cleaners.  Household pests  and their droppings, including dust mites and cockroaches.  Certain medicines, including NSAIDs. Once you have determined your asthma triggers, take steps to avoid  them. Depending on your triggers, you may be able to reduce the chance of an asthma attack by:  Keeping your home clean. Have someone dust and vacuum your home for you 1 or 2 times a week. If possible, have them use a high-efficiency particulate arrestance (HEPA) vacuum.  Washing your sheets weekly in hot water.  Using allergy-proof mattress covers and casings on your bed.  Keeping pets out of your home.  Taking care of mold and water problems in your home.  Avoiding areas where people smoke.  Avoiding using strong perfumes or odor sprays.  Avoid spending a lot of time outdoors when pollen counts are high and on very windy days.  Talking with your health care provider before stopping or starting any new medicines. Medicines Take over-the-counter and prescription medicines only as told by your health care provider. Many asthma attacks can be prevented by carefully following your medicine schedule. Taking your medicines correctly is especially important when you cannot avoid certain asthma triggers. Even if you are doing well, do not stop taking your medicine and do not take less medicine. Act quickly If an asthma attack happens, acting quickly can decrease how severe it is and how long it lasts. Take these actions:  Pay attention to your symptoms. If you are coughing, wheezing, or having difficulty breathing, do not wait to see if your symptoms go away on their own. Follow your asthma action plan.  If you have followed your asthma action plan and your symptoms are not improving, call your health care provider or seek immediate medical care at the nearest hospital. It is important to write down how often you need to use your fast-acting rescue inhaler. You can track how often you use an inhaler in your journal. If you are using your rescue inhaler more often, it may mean that your asthma is not under control. Adjusting your asthma treatment plan may help you to prevent future asthma attacks  and help you to gain better control of your condition. How can I prevent an asthma attack when I exercise? Exercise is a common asthma trigger. To prevent asthma attacks during exercise:  Follow advice from your health care provider about whether you should use your fast-acting inhaler before exercising. Many people with asthma experience exercise-induced bronchoconstriction (EIB). This condition often worsens during vigorous exercise in cold, humid, or dry environments. Usually, people with EIB can stay very active by using a fast-acting inhaler before exercising.  Avoid exercising outdoors in very cold or humid weather.  Avoid exercising outdoors when pollen counts are high.  Warm up and cool down when exercising.  Stop exercising right away if asthma symptoms start. Consider taking part in exercises that are less likely to cause asthma symptoms such as:  Indoor swimming.  Biking.  Walking.  Hiking.  Playing football. This information is not intended to replace advice given to you by your health care provider. Make sure you discuss any questions you have with your health care provider. Document Revised: 08/29/2017 Document Reviewed: 03/02/2016 Elsevier Patient Education  [place]. Deep Vein Thrombosis  Deep vein thrombosis (DVT) is a condition in which a blood clot forms in a deep vein, such as a lower leg, thigh, or arm vein. A clot is blood that has thickened into a gel or solid. This condition is dangerous. It can lead  to serious and even life-threatening complications if the clot travels to the lungs and causes a blockage (pulmonary embolism). It can also damage veins in the leg. This can result in leg pain, swelling, discoloration, and sores (post-thrombotic syndrome). What are the causes? This condition may be caused by:  A slowdown of blood flow.  Damage to a vein.  A condition that causes blood to clot more easily, such as an inherited clotting disorder. What  increases the risk? The following factors may make you more likely to develop this condition:  Being overweight.  Being older, especially over age 14.  Sitting or lying down for more than four hours.  Being in the hospital.  Lack of physical activity (sedentary lifestyle).  Pregnancy, being in childbirth, or having recently given birth.  Taking medicines that contain estrogen, such as medicines to prevent pregnancy.  Smoking.  A history of any of the following: ? Blood clots or a blood clotting disease. ? Peripheral vascular disease. ? Inflammatory bowel disease. ? Cancer. ? Heart disease. ? Genetic conditions that affect how your blood clots, such as Factor V Leiden mutation. ? Neurological diseases that affect your legs (leg paresis). ? A recent injury, such as a car accident. ? Major or lengthy surgery. ? A central line placed inside a large vein. What are the signs or symptoms? Symptoms of this condition include:  Swelling, pain, or tenderness in an arm or leg.  Warmth, redness, or discoloration in an arm or leg. If the clot is in your leg, symptoms may be more noticeable or worse when you stand or walk. Some people may not develop any symptoms. How is this diagnosed? This condition is diagnosed with:  A medical history and physical exam.  Tests, such as: ? Blood tests. These are done to check how well your blood clots. ? Ultrasound. This is done to check for clots. ? Venogram. For this test, contrast dye is injected into a vein and X-rays are taken to check for any clots. How is this treated? Treatment for this condition depends on:  The cause of your DVT.  Your risk for bleeding or developing more clots.  Any other medical conditions that you have. Treatment may include:  Taking a blood thinner (anticoagulant). This type of medicine prevents clots from forming. It may be taken by mouth, injected under the skin, or injected through an IV  (catheter).  Injecting clot-dissolving medicines into the affected vein (catheter-directed thrombolysis).  Having surgery. Surgery may be done to: ? Remove the clot. ? Place a filter in a large vein to catch blood clots before they reach the lungs. Some treatments may be continued for up to six months. Follow these instructions at home: If you are taking blood thinners:  Take the medicine exactly as told by your health care provider. Some blood thinners need to be taken at the same time every day. Do not skip a dose.  Talk with your health care provider before you take any medicines that contain aspirin or NSAIDs. These medicines increase your risk for dangerous bleeding.  Ask your health care provider about foods and drugs that could change the way the medicine works (may interact). Avoid those things if your health care provider tells you to do so.  Blood thinners can cause easy bruising and may make it difficult to stop bleeding. Because of this: ? Be very careful when using knives, scissors, or other sharp objects. ? Use an electric razor instead of a blade. ?  Avoid activities that could cause injury or bruising, and follow instructions about how to prevent falls.  Wear a medical alert bracelet or carry a card that lists what medicines you take. General instructions  Take over-the-counter and prescription medicines only as told by your health care provider.  Return to your normal activities as told by your health care provider. Ask your health care provider what activities are safe for you.  Wear compression stockings if recommended by your health care provider.  Keep all follow-up visits as told by your health care provider. This is important. How is this prevented? To lower your risk of developing this condition again:  For 30 or more minutes every day, do an activity that: ? Involves moving your arms and legs. ? Increases your heart rate.  When traveling for longer than  four hours: ? Exercise your arms and legs every hour. ? Drink plenty of water. ? Avoid drinking alcohol.  Avoid sitting or lying for a long time without moving your legs.  If you have surgery or you are hospitalized, ask about ways to prevent blood clots. These may include taking frequent walks or using anticoagulants.  Stay at a healthy weight.  If you are a woman who is older than age 53, avoid unnecessary use of medicines that contain estrogen, such as some birth control pills.  Do not use any products that contain nicotine or tobacco, such as cigarettes and e-cigarettes. This is especially important if you take estrogen medicines. If you need help quitting, ask your health care provider. Contact a health care provider if:  You miss a dose of your blood thinner.  Your menstrual period is heavier than usual.  You have unusual bruising. Get help right away if:  You have: ? New or increased pain, swelling, or redness in an arm or leg. ? Numbness or tingling in an arm or leg. ? Shortness of breath. ? Chest pain. ? A rapid or irregular heartbeat. ? A severe headache or confusion. ? A cut that will not stop bleeding.  There is blood in your vomit, stool, or urine.  You have a serious fall or accident, or you hit your head.  You feel light-headed or dizzy.  You cough up blood. These symptoms may represent a serious problem that is an emergency. Do not wait to see if the symptoms will go away. Get medical help right away. Call your local emergency services (911 in the [place] Do not drive yourself to the hospital. Summary  Deep vein thrombosis (DVT) is a condition in which a blood clot forms in a deep vein, such as a lower leg, thigh, or arm vein.  Symptoms can include swelling, warmth, pain, and redness in your leg or arm.  This condition may be treated with a blood thinner (anticoagulant medicine), medicine that is injected to dissolve blood clots,compression stockings, or  surgery.  If you are prescribed blood thinners, take them exactly as told. This information is not intended to replace advice given to you by your health care provider. Make sure you discuss any questions you have with your health care provider. Document Revised: 08/29/2017 Document Reviewed: 02/14/2017 Elsevier Patient Education  [place]. Edema  Edema is an abnormal buildup of fluids in the body tissues and under the skin. Swelling of the legs, feet, and ankles is a common symptom that becomes more likely as you get older. Swelling is also common in looser tissues, like around the eyes. When the affected area is  squeezed, the fluid may move out of that spot and leave a dent for a few moments. This dent is called pitting edema. There are many possible causes of edema. Eating too much salt (sodium) and being on your feet or sitting for a long time can cause edema in your legs, feet, and ankles. Hot weather may make edema worse. Common causes of edema include:  Heart failure.  Liver or kidney disease.  Weak leg blood vessels.  Cancer.  An injury.  Pregnancy.  Medicines.  Being obese.  Low protein levels in the blood. Edema is usually painless. Your skin may look swollen or shiny. Follow these instructions at home:  Keep the affected body part raised (elevated) above the level of your heart when you are sitting or lying down.  Do not sit still or stand for long periods of time.  Do not wear tight clothing. Do not wear garters on your upper legs.  Exercise your legs to get your circulation going. This helps to move the fluid back into your blood vessels, and it may help the swelling go down.  Wear elastic bandages or support stockings to reduce swelling as told by your health care provider.  Eat a low-salt (low-sodium) diet to reduce fluid as told by your health care provider.  Depending on the cause of your swelling, you may need to limit how much fluid you drink  (fluid restriction).  Take over-the-counter and prescription medicines only as told by your health care provider. Contact a health care provider if:  Your edema does not get better with treatment.  You have heart, liver, or kidney disease and have symptoms of edema.  You have sudden and unexplained weight gain. Get help right away if:  You develop shortness of breath or chest pain.  You cannot breathe when you lie down.  You develop pain, redness, or warmth in the swollen areas.  You have heart, liver, or kidney disease and suddenly get edema.  You have a fever and your symptoms suddenly get worse. Summary  Edema is an abnormal buildup of fluids in the body tissues and under the skin.  Eating too much salt (sodium) and being on your feet or sitting for a long time can cause edema in your legs, feet, and ankles.  Keep the affected body part raised (elevated) above the level of your heart when you are sitting or lying down. This information is not intended to replace advice given to you by your health care provider. Make sure you discuss any questions you have with your health care provider. Document Revised: 02/03/2019 Document Reviewed: 10/19/2016 Elsevier Patient Education  [place]. Sinusitis, Adult Sinusitis is inflammation of your sinuses. Sinuses are hollow spaces in the bones around your face. Your sinuses are located:  Around your eyes.  In the middle of your forehead.  Behind your nose.  In your cheekbones. Mucus normally drains out of your sinuses. When your nasal tissues become inflamed or swollen, mucus can become trapped or blocked. This allows bacteria, viruses, and fungi to grow, which leads to infection. Most infections of the sinuses are caused by a virus. Sinusitis can develop quickly. It can last for up to 4 weeks (acute) or for more than 12 weeks (chronic). Sinusitis often develops after a cold. What are the causes? This condition is caused by  anything that creates swelling in the sinuses or stops mucus from draining. This includes:  Allergies.  Asthma.  Infection from bacteria or viruses.  Deformities or blockages in your nose or sinuses.  Abnormal growths in the nose (nasal polyps).  Pollutants, such as chemicals or irritants in the air.  Infection from fungi (rare). What increases the risk? You are more likely to develop this condition if you:  Have a weak body defense system (immune system).  Do a lot of swimming or diving.  Overuse nasal sprays.  Smoke. What are the signs or symptoms? The main symptoms of this condition are pain and a feeling of pressure around the affected sinuses. Other symptoms include:  Stuffy nose or congestion.  Thick drainage from your nose.  Swelling and warmth over the affected sinuses.  Headache.  Upper toothache.  A cough that may get worse at night.  Extra mucus that collects in the throat or the back of the nose (postnasal drip).  Decreased sense of smell and taste.  Fatigue.  A fever.  Sore throat.  Bad breath. How is this diagnosed? This condition is diagnosed based on:  Your symptoms.  Your medical history.  A physical exam.  Tests to find out if your condition is acute or chronic. This may include: ? Checking your nose for nasal polyps. ? Viewing your sinuses using a device that has a light (endoscope). ? Testing for allergies or bacteria. ? Imaging tests, such as an MRI or CT scan. In rare cases, a bone biopsy may be done to rule out more serious types of fungal sinus disease. How is this treated? Treatment for sinusitis depends on the cause and whether your condition is chronic or acute.  If caused by a virus, your symptoms should go away on their own within 10 days. You may be given medicines to relieve symptoms. They include: ? Medicines that shrink swollen nasal passages (topical intranasal decongestants). ? Medicines that treat allergies  (antihistamines). ? A spray that eases inflammation of the nostrils (topical intranasal corticosteroids). ? Rinses that help get rid of thick mucus in your nose (nasal saline washes).  If caused by bacteria, your health care provider may recommend waiting to see if your symptoms improve. Most bacterial infections will get better without antibiotic medicine. You may be given antibiotics if you have: ? A severe infection. ? A weak immune system.  If caused by narrow nasal passages or nasal polyps, you may need to have surgery. Follow these instructions at home: Medicines  Take, use, or apply over-the-counter and prescription medicines only as told by your health care provider. These may include nasal sprays.  If you were prescribed an antibiotic medicine, take it as told by your health care provider. Do not stop taking the antibiotic even if you start to feel better. Hydrate and humidify   Drink enough fluid to keep your urine pale yellow. Staying hydrated will help to thin your mucus.  Use a cool mist humidifier to keep the humidity level in your home above 50%.  Inhale steam for 10-15 minutes, 3-4 times a day, or as told by your health care provider. You can do this in the bathroom while a hot shower is running.  Limit your exposure to cool or dry air. Rest  Rest as much as possible.  Sleep with your head raised (elevated).  Make sure you get enough sleep each night. General instructions   Apply a warm, moist washcloth to your face 3-4 times a day or as told by your health care provider. This will help with discomfort.  Wash your hands often with soap and water to reduce  your exposure to germs. If soap and water are not available, use hand sanitizer.  Do not smoke. Avoid being around people who are smoking (secondhand smoke).  Keep all follow-up visits as told by your health care provider. This is important. Contact a health care provider if:  You have a fever.  Your  symptoms get worse.  Your symptoms do not improve within 10 days. Get help right away if:  You have a severe headache.  You have persistent vomiting.  You have severe pain or swelling around your face or eyes.  You have vision problems.  You develop confusion.  Your neck is stiff.  You have trouble breathing. Summary  Sinusitis is soreness and inflammation of your sinuses. Sinuses are hollow spaces in the bones around your face.  This condition is caused by nasal tissues that become inflamed or swollen. The swelling traps or blocks the flow of mucus. This allows bacteria, viruses, and fungi to grow, which leads to infection.  If you were prescribed an antibiotic medicine, take it as told by your health care provider. Do not stop taking the antibiotic even if you start to feel better.  Keep all follow-up visits as told by your health care provider. This is important. This information is not intended to replace advice given to you by your health care provider. Make sure you discuss any questions you have with your health care provider. Document Revised: 02/16/2018 Document Reviewed: 02/16/2018 Elsevier Patient Education  2020 [place]. Nonallergic Rhinitis Nonallergic rhinitis is a condition that causes symptoms that affect the nose, such as a runny nose and a stuffed-up nose (nasal congestion) that can make it hard to breathe through the nose. This condition is different from having an allergy (allergic rhinitis). Allergic rhinitis occurs when the body's defense system (immune system) reacts to a substance that you are allergic to (allergen), such as pollen, pet dander, mold, or dust. Nonallergic rhinitis has many similar symptoms, but it is not caused by allergens. Nonallergic rhinitis can be a short-term or long-term problem. What are the causes? This condition can be caused by many different things. Some common types of nonallergic rhinitis include: Infectious  rhinitis  This is usually due to an infection in the upper respiratory tract. Vasomotor rhinitis  This is the most common type of long-term nonallergic rhinitis.  It is caused by too much blood flow through the nose, which makes the tissue inside of the nose swell.  Symptoms are often triggered by strong odors, cold air, stress, drinking alcohol, cigarette smoke, or changes in the weather. Occupational rhinitis  This type is caused by triggers in the workplace, such as chemicals, dusts, animal dander, or air pollution. Hormonal rhinitis  This type occurs in women as a result of an increase in the female hormone estrogen.  It may occur during pregnancy, puberty, and menstrual cycles.  Symptoms improve when estrogen levels drop. Drug-induced rhinitis Several drugs can cause nonallergic rhinitis, including:  Medicines that are used to treat high blood pressure, heart disease, and Parkinson disease.  Aspirin and NSAIDs.  Over-the-counter nasal decongestant sprays. These can cause a type of nonallergic rhinitis (rhinitis medicamentosa) when they are used for more than a few days. Nonallergic rhinitis with eosinophilia syndrome (NARES)  This type is caused by having too much of a certain type of white blood cell (eosinophil). Nonallergic rhinitis can also be caused by a reaction to eating hot or spicy foods. This does not usually cause long-term symptoms. In some cases,  the cause of nonallergic rhinitis is not known. What increases the risk? You are more likely to develop this condition if:  You are 101-106 years of age.  You are a woman. Women are twice as likely to have this condition. What are the signs or symptoms? Common symptoms of this condition include:  Nasal congestion.  Runny nose.  The feeling of mucus going down the back of the throat (postnasal drip).  Trouble sleeping at night and daytime sleepiness. Less common symptoms  include:  Sneezing.  Coughing.  Itchy nose.  Bloodshot eyes. How is this diagnosed? This condition may be diagnosed based on:  Your symptoms and medical history.  A physical exam.  Allergy testing to rule out allergic rhinitis. You may have skin tests or blood tests. In some cases, the health care provider may take a swab of nasal secretions to look for an increased number of eosinophils. This would be done to confirm a diagnosis of NARES. How is this treated? Treatment for this condition depends on the cause. No single treatment works for everyone. Work with your health care provider to find the best treatment for you. Treatment may include:  Avoiding the things that trigger your symptoms.  Using medicines to relieve congestion, such as: ? Steroid nasal spray. There are many types. You may need to try a few to find out which one works best. ? Decongestant medicine. This may be an oral medicine or a nasal spray. These medicines are only used for a short time.  Using medicines to relieve a runny nose. These may include antihistamine medicines or anticholinergic nasal sprays.  Surgery to remove tissue from inside the nose may be needed in severe cases if the condition has not improved after 6-12 months of medical treatment. Follow these instructions at home:  Take or use over-the-counter and prescription medicines only as told by your health care provider. Do not stop using your medicine even if you start to feel better.  Use salt-water (saline) rinses or other solutions (nasal washes or irrigations) to wash or rinse out the inside of your nose as told by your health care provider.  Do not take NSAIDs or medicines that contain aspirin if they make your symptoms worse.  Do not drink alcohol if it makes your symptoms worse.  Do not use any tobacco products, such as cigarettes, chewing tobacco, and e-cigarettes. If you need help quitting, ask your health care provider.  Avoid  secondhand smoke.  Get some exercise every day. Exercise may help reduce symptoms of nonallergic rhinitis for some people. Ask your health care provider how much exercise and what types of exercise are safe for you.  Sleep with the head of your bed raised (elevated). This may reduce nighttime nasal congestion.  Keep all follow-up visits as told by your health care provider. This is important. Contact a health care provider if:  You have a fever.  Your symptoms are getting worse at home.  Your symptoms are not responding to medicine.  You develop new symptoms, especially a headache or nosebleed. This information is not intended to replace advice given to you by your health care provider. Make sure you discuss any questions you have with your health care provider. Document Revised: 08/29/2017 Document Reviewed: 12/07/2015 Elsevier Patient Education  [place]. Allergic Rhinitis, Adult Allergic rhinitis is an allergic reaction that affects the mucous membrane inside the nose. It causes sneezing, a runny or stuffy nose, and the feeling of mucus going down the  back of the throat (postnasal drip). Allergic rhinitis can be mild to severe. There are two types of allergic rhinitis:  Seasonal. This type is also called hay fever. It happens only during certain seasons.  Perennial. This type can happen at any time of the year. What are the causes? This condition happens when the body's defense system (immune system) responds to certain harmless substances called allergens as though they were germs.  Seasonal allergic rhinitis is triggered by pollen, which can come from grasses, trees, and weeds. Perennial allergic rhinitis may be caused by:  House dust mites.  Pet dander.  Mold spores. What are the signs or symptoms? Symptoms of this condition include:  Sneezing.  Runny or stuffy nose (nasal congestion).  Postnasal drip.  Itchy nose.  Tearing of the eyes.  Trouble  sleeping.  Daytime sleepiness. How is this diagnosed? This condition may be diagnosed based on:  Your medical history.  A physical exam.  Tests to check for related conditions, such as: ? Asthma. ? Pink eye. ? Ear infection. ? Upper respiratory infection.  Tests to find out which allergens trigger your symptoms. These may include skin or blood tests. How is this treated? There is no cure for this condition, but treatment can help control symptoms. Treatment may include:  Taking medicines that block allergy symptoms, such as antihistamines. Medicine may be given as a shot, nasal spray, or pill.  Avoiding the allergen.  Desensitization. This treatment involves getting ongoing shots until your body becomes less sensitive to the allergen. This treatment may be done if other treatments do not help.  If taking medicine and avoiding the allergen does not work, new, stronger medicines may be prescribed. Follow these instructions at home:  Find out what you are allergic to. Common allergens include smoke, dust, and pollen.  Avoid the things you are allergic to. These are some things you can do to help avoid allergens: ? Replace carpet with wood, tile, or vinyl flooring. Carpet can trap dander and dust. ? Do not smoke. Do not allow smoking in your home. ? Change your heating and air conditioning filter at least once a month. ? During allergy season:  Keep windows closed as much as possible.  Plan outdoor activities when pollen counts are lowest. This is usually during the evening hours.  When coming indoors, change clothing and shower before sitting on furniture or bedding.  Take over-the-counter and prescription medicines only as told by your health care provider.  Keep all follow-up visits as told by your health care provider. This is important. Contact a health care provider if:  You have a fever.  You develop a persistent cough.  You make whistling sounds when you  breathe (you wheeze).  Your symptoms interfere with your normal daily activities. Get help right away if:  You have shortness of breath. Summary  This condition can be managed by taking medicines as directed and avoiding allergens.  Contact your health care provider if you develop a persistent cough or fever.  During allergy season, keep windows closed as much as possible. This information is not intended to replace advice given to you by your health care provider. Make sure you discuss any questions you have with your health care provider. Document Revised: 08/29/2017 Document Reviewed: 10/24/2016 Elsevier Patient Education  [place], Adult After being diagnosed with an anxiety disorder, you may be relieved to know why you have felt or behaved a certain way. You may also feel overwhelmed  about the treatment ahead and what it will mean for your life. With care and support, you can manage this condition and recover from it. How to manage lifestyle changes Managing stress and anxiety  Stress is your body's reaction to life changes and events, both good and bad. Most stress will last just a few hours, but stress can be ongoing and can lead to more than just stress. Although stress can play a major role in anxiety, it is not the same as anxiety. Stress is usually caused by something external, such as a deadline, test, or competition. Stress normally passes after the triggering event has ended.  Anxiety is caused by something internal, such as imagining a terrible outcome or worrying that something will go wrong that will devastate you. Anxiety often does not go away even after the triggering event is over, and it can become long-term (chronic) worry. It is important to understand the differences between stress and anxiety and to manage your stress effectively so that it does not lead to an anxious response. Talk with your health care provider or a counselor to learn  more about reducing anxiety and stress. He or she may suggest tension reduction techniques, such as:  Music therapy. This can include creating or listening to music that you enjoy and that inspires you.  Mindfulness-based meditation. This involves being aware of your normal breaths while not trying to control your breathing. It can be done while sitting or walking.  Centering prayer. This involves focusing on a word, phrase, or sacred image that means something to you and brings you peace.  Deep breathing. To do this, expand your stomach and inhale slowly through your nose. Hold your breath for 3-5 seconds. Then exhale slowly, letting your stomach muscles relax.  Self-talk. This involves identifying thought patterns that lead to anxiety reactions and changing those patterns.  Muscle relaxation. This involves tensing muscles and then relaxing them. Choose a tension reduction technique that suits your lifestyle and personality. These techniques take time and practice. Set aside 5-15 minutes a day to do them. Therapists can offer counseling and training in these techniques. The training to help with anxiety may be covered by some insurance plans. Other things you can do to manage stress and anxiety include:  Keeping a stress/anxiety diary. This can help you learn what triggers your reaction and then learn ways to manage your response.  Thinking about how you react to certain situations. You may not be able to control everything, but you can control your response.  Making time for activities that help you relax and not feeling guilty about spending your time in this way.  Visual imagery and yoga can help you stay calm and relax.  Medicines Medicines can help ease symptoms. Medicines for anxiety include:  Anti-anxiety drugs.  Antidepressants. Medicines are often used as a primary treatment for anxiety disorder. Medicines will be prescribed by a health care provider. When used together,  medicines, psychotherapy, and tension reduction techniques may be the most effective treatment. Relationships Relationships can play a big part in helping you recover. Try to spend more time connecting with trusted friends and family members. Consider going to couples counseling, taking family education classes, or going to family therapy. Therapy can help you and others better understand your condition. How to recognize changes in your anxiety Everyone responds differently to treatment for anxiety. Recovery from anxiety happens when symptoms decrease and stop interfering with your daily activities at home or work. This may mean  that you will start to:  Have better concentration and focus. Worry will interfere less in your daily thinking.  Sleep better.  Be less irritable.  Have more energy.  Have improved memory. It is important to recognize when your condition is getting worse. Contact your health care provider if your symptoms interfere with home or work and you feel like your condition is not improving. Follow these instructions at home: Activity  Exercise. Most adults should do the following: ? Exercise for at least 150 minutes each week. The exercise should increase your heart rate and make you sweat (moderate-intensity exercise). ? Strengthening exercises at least twice a week.  Get the right amount and quality of sleep. Most adults need 7-9 hours of sleep each night. Lifestyle   Eat a healthy diet that includes plenty of vegetables, fruits, whole grains, low-fat dairy products, and lean protein. Do not eat a lot of foods that are high in solid fats, added sugars, or salt.  Make choices that simplify your life.  Do not use any products that contain nicotine or tobacco, such as cigarettes, e-cigarettes, and chewing tobacco. If you need help quitting, ask your health care provider.  Avoid caffeine, alcohol, and certain over-the-counter cold medicines. These may make you feel  worse. Ask your pharmacist which medicines to avoid. General instructions  Take over-the-counter and prescription medicines only as told by your health care provider.  Keep all follow-up visits as told by your health care provider. This is important. Where to find support You can get help and support from these sources:  Self-help groups.  Online and [employer].  A trusted spiritual leader.  Couples counseling.  Family education classes.  Family therapy. Where to find more information You may find that joining a support group helps you deal with your anxiety. The following sources can help you locate counselors or support groups near you:  [place]: www.mentalhealthamerica.net  Anxiety and Depression Association of [place] (ADAA): https://www.clark.net/  National Alliance on Mental Illness (NAMI): www.nami.org Contact a health care provider if you:  Have a hard time staying focused or finishing daily tasks.  Spend many hours a day feeling worried about everyday life.  Become exhausted by worry.  Start to have headaches, feel tense, or have nausea.  Urinate more than normal.  Have diarrhea. Get help right away if you have:  A racing heart and shortness of breath.  Thoughts of hurting yourself or others. If you ever feel like you may hurt yourself or others, or have thoughts about taking your own life, get help right away. You can go to your nearest emergency department or call:  Your local emergency services (911 in the [place]  A suicide crisis helpline, such as the [place] at (902)069-6401. This is open 24 hours a day. Summary  Taking steps to learn and use tension reduction techniques can help calm you and help prevent triggering an anxiety reaction.  When used together, medicines, psychotherapy, and tension reduction techniques may be the most effective treatment.  Family, friends, and partners can play a big  part in helping you recover from an anxiety disorder. This information is not intended to replace advice given to you by your health care provider. Make sure you discuss any questions you have with your health care provider. Document Revised: 02/16/2019 Document Reviewed: 02/16/2019 Elsevier Patient Education  [place] With Depression Everyone experiences occasional disappointment, sadness, and loss in their lives. When you are feeling down,  blue, or sad for at least 2 weeks in a row, it may mean that you have depression. Depression can affect your thoughts and feelings, relationships, daily activities, and physical health. It is caused by changes in the way your brain functions. If you receive a diagnosis of depression, your health care provider will tell you which type of depression you have and what treatment options are available to you. If you are living with depression, there are ways to help you recover from it and also ways to prevent it from coming back. How to cope with lifestyle changes Coping with stress     Stress is your body's reaction to life changes and events, both good and bad. Stressful situations may include:  Getting married.  The death of a spouse.  Losing a job.  Retiring.  Having a baby. Stress can last just a few hours or it can be ongoing. Stress can play a major role in depression, so it is important to learn both how to cope with stress and how to think about it differently. Talk with your health care provider or a counselor if you would like to learn more about stress reduction. He or she may suggest some stress reduction techniques, such as:  Music therapy. This can include creating music or listening to music. Choose music that you enjoy and that inspires you.  Mindfulness-based meditation. This kind of meditation can be done while sitting or walking. It involves being aware of your normal breaths, rather than trying to control your  breathing.  Centering prayer. This is a kind of meditation that involves focusing on a spiritual word or phrase. Choose a word, phrase, or sacred image that is meaningful to you and that brings you peace.  Deep breathing. To do this, expand your stomach and inhale slowly through your nose. Hold your breath for 3-5 seconds, then exhale slowly, allowing your stomach muscles to relax.  Muscle relaxation. This involves intentionally tensing muscles then relaxing them. Choose a stress reduction technique that fits your lifestyle and personality. Stress reduction techniques take time and practice to develop. Set aside 5-15 minutes a day to do them. Therapists can offer training in these techniques. The training may be covered by some insurance plans. Other things you can do to manage stress include:  Keeping a stress diary. This can help you learn what triggers your stress and ways to control your response.  Understanding what your limits are and saying no to requests or events that lead to a schedule that is too full.  Thinking about how you respond to certain situations. You may not be able to control everything, but you can control how you react.  Adding humor to your life by watching funny films or TV shows.  Making time for activities that help you relax and not feeling guilty about spending your time this way.  Medicines Your health care provider may suggest certain medicines if he or she feels that they will help improve your condition. Avoid using alcohol and other substances that may prevent your medicines from working properly (may interact). It is also important to:  Talk with your pharmacist or health care provider about all the medicines that you take, their possible side effects, and what medicines are safe to take together.  Make it your goal to take part in all treatment decisions (shared decision-making). This includes giving input on the side effects of medicines. It is best if  shared decision-making with your health care provider is  part of your total treatment plan. If your health care provider prescribes a medicine, you may not notice the full benefits of it for 4-8 weeks. Most people who are treated for depression need to be on medicine for at least 6-12 months after they feel better. If you are taking medicines as part of your treatment, do not stop taking medicines without first talking to your health care provider. You may need to have the medicine slowly decreased (tapered) over time to decrease the risk of harmful side effects. Relationships Your health care provider may suggest family therapy along with individual therapy and drug therapy. While there may not be family problems that are causing you to feel depressed, it is still important to make sure your family learns as much as they can about your mental health. Having your family's support can help make your treatment successful. How to recognize changes in your condition Everyone has a different response to treatment for depression. Recovery from major depression happens when you have not had signs of major depression for two months. This may mean that you will start to:  Have more interest in doing activities.  Feel less hopeless than you did 2 months ago.  Have more energy.  Overeat less often, or have better or improving appetite.  Have better concentration. Your health care provider will work with you to decide the next steps in your recovery. It is also important to recognize when your condition is getting worse. Watch for these signs:  Having fatigue or low energy.  Eating too much or too little.  Sleeping too much or too little.  Feeling restless, agitated, or hopeless.  Having trouble concentrating or making decisions.  Having unexplained physical complaints.  Feeling irritable, angry, or aggressive. Get help as soon as you or your family members notice these symptoms coming back. How  to get support and help from others How to talk with friends and family members about your condition  Talking to friends and family members about your condition can provide you with one way to get support and guidance. Reach out to trusted friends or family members, explain your symptoms to them, and let them know that you are working with a health care provider to treat your depression. Financial resources Not all insurance plans cover mental health care, so it is important to check with your insurance carrier. If paying for co-pays or counseling services is a problem, search for a local or county mental health care center. They may be able to offer public mental health care services at low or no cost when you are not able to see a private health care provider. If you are taking medicine for depression, you may be able to get the generic form, which may be less expensive. Some makers of prescription medicines also offer help to patients who cannot afford the medicines they need. Follow these instructions at home:   Get the right amount and quality of sleep.  Cut down on using caffeine, tobacco, alcohol, and other potentially harmful substances.  Try to exercise, such as walking or lifting small weights.  Take over-the-counter and prescription medicines only as told by your health care provider.  Eat a healthy diet that includes plenty of vegetables, fruits, whole grains, low-fat dairy products, and lean protein. Do not eat a lot of foods that are high in solid fats, added sugars, or salt.  Keep all follow-up visits as told by your health care provider. This is important. Contact a health  care provider if:  You stop taking your antidepressant medicines, and you have any of these symptoms: ? Nausea. ? Headache. ? Feeling lightheaded. ? Chills and body aches. ? Not being able to sleep (insomnia).  You or your friends and family think your depression is getting worse. Get help right away  if:  You have thoughts of hurting yourself or others. If you ever feel like you may hurt yourself or others, or have thoughts about taking your own life, get help right away. You can go to your nearest emergency department or call:  Your local emergency services (911 in the [place]  A suicide crisis helpline, such as the [place] at (442)750-4755. This is open 24-hours a day. Summary  If you are living with depression, there are ways to help you recover from it and also ways to prevent it from coming back.  Work with your health care team to create a management plan that includes counseling, stress management techniques, and healthy lifestyle habits. This information is not intended to replace advice given to you by your health care provider. Make sure you discuss any questions you have with your health care provider. Document Revised: 01/08/2019 Document Reviewed: 08/19/2016 Elsevier Patient Education  [place]. Managing Your Hypertension Hypertension is commonly called high blood pressure. This is when the force of your blood pressing against the walls of your arteries is too strong. Arteries are blood vessels that carry blood from your heart throughout your body. Hypertension forces the heart to work harder to pump blood, and may cause the arteries to become narrow or stiff. Having untreated or uncontrolled hypertension can cause heart attack, stroke, kidney disease, and other problems. What are blood pressure readings? A blood pressure reading consists of a higher number over a lower number. Ideally, your blood pressure should be below 120/80. The first ("top") number is called the systolic pressure. It is a measure of the pressure in your arteries as your heart beats. The second ("bottom") number is called the diastolic pressure. It is a measure of the pressure in your arteries as the heart relaxes. What does my blood pressure reading mean? Blood  pressure is classified into four stages. Based on your blood pressure reading, your health care provider may use the following stages to determine what type of treatment you need, if any. Systolic pressure and diastolic pressure are measured in a unit called mm Hg. Normal  Systolic pressure: below 295.  Diastolic pressure: below 80. Elevated  Systolic pressure: 621-308.  Diastolic pressure: below 80. Hypertension stage 1  Systolic pressure: 657-846.  Diastolic pressure: 96-29. Hypertension stage 2  Systolic pressure: 528 or above.  Diastolic pressure: 90 or above. What health risks are associated with hypertension? Managing your hypertension is an important responsibility. Uncontrolled hypertension can lead to:  A heart attack.  A stroke.  A weakened blood vessel (aneurysm).  Heart failure.  Kidney damage.  Eye damage.  Metabolic syndrome.  Memory and concentration problems. What changes can I make to manage my hypertension? Hypertension can be managed by making lifestyle changes and possibly by taking medicines. Your health care provider will help you make a plan to bring your blood pressure within a normal range. Eating and drinking   Eat a diet that is high in fiber and potassium, and low in salt (sodium), added sugar, and fat. An example eating plan is called the DASH (Dietary Approaches to Stop Hypertension) diet. To eat this way: ? Eat plenty of  fresh fruits and vegetables. Try to fill half of your plate at each meal with fruits and vegetables. ? Eat whole grains, such as whole wheat pasta, brown rice, or whole grain bread. Fill about one quarter of your plate with whole grains. ? Eat low-fat diary products. ? Avoid fatty cuts of meat, processed or cured meats, and poultry with skin. Fill about one quarter of your plate with lean proteins such as fish, chicken without skin, beans, eggs, and tofu. ? Avoid premade and processed foods. These tend to be higher in  sodium, added sugar, and fat.  Reduce your daily sodium intake. Most people with hypertension should eat less than 1,500 mg of sodium a day.  Limit alcohol intake to no more than 1 drink a day for nonpregnant women and 2 drinks a day for men. One drink equals 12 oz of beer, 5 oz of wine, or 1 oz of hard liquor. Lifestyle  Work with your health care provider to maintain a healthy body weight, or to lose weight. Ask what an ideal weight is for you.  Get at least 30 minutes of exercise that causes your heart to beat faster (aerobic exercise) most days of the week. Activities may include walking, swimming, or biking.  Include exercise to strengthen your muscles (resistance exercise), such as weight lifting, as part of your weekly exercise routine. Try to do these types of exercises for 30 minutes at least 3 days a week.  Do not use any products that contain nicotine or tobacco, such as cigarettes and e-cigarettes. If you need help quitting, ask your health care provider.  Control any long-term (chronic) conditions you have, such as high cholesterol or diabetes. Monitoring  Monitor your blood pressure at home as told by your health care provider. Your personal target blood pressure may vary depending on your medical conditions, your age, and other factors.  Have your blood pressure checked regularly, as often as told by your health care provider. Working with your health care provider  Review all the medicines you take with your health care provider because there may be side effects or interactions.  Talk with your health care provider about your diet, exercise habits, and other lifestyle factors that may be contributing to hypertension.  Visit your health care provider regularly. Your health care provider can help you create and adjust your plan for managing hypertension. Will I need medicine to control my blood pressure? Your health care provider may prescribe medicine if lifestyle changes  are not enough to get your blood pressure under control, and if:  Your systolic blood pressure is 130 or higher.  Your diastolic blood pressure is 80 or higher. Take medicines only as told by your health care provider. Follow the directions carefully. Blood pressure medicines must be taken as prescribed. The medicine does not work as well when you skip doses. Skipping doses also puts you at risk for problems. Contact a health care provider if:  You think you are having a reaction to medicines you have taken.  You have repeated (recurrent) headaches.  You feel dizzy.  You have swelling in your ankles.  You have trouble with your vision. Get help right away if:  You develop a severe headache or confusion.  You have unusual weakness or numbness, or you feel faint.  You have severe pain in your chest or abdomen.  You vomit repeatedly.  You have trouble breathing. Summary  Hypertension is when the force of blood pumping through your arteries is  too strong. If this condition is not controlled, it may put you at risk for serious complications.  Your personal target blood pressure may vary depending on your medical conditions, your age, and other factors. For most people, a normal blood pressure is less than 120/80.  Hypertension is managed by lifestyle changes, medicines, or both. Lifestyle changes include weight loss, eating a healthy, low-sodium diet, exercising more, and limiting alcohol. This information is not intended to replace advice given to you by your health care provider. Make sure you discuss any questions you have with your health care provider. Document Revised: 01/08/2019 Document Reviewed: 08/14/2016 Elsevier Patient Education  [place]. High Cholesterol  High cholesterol is a condition in which the blood has high levels of a white, waxy, fat-like substance (cholesterol). The human body needs small amounts of cholesterol. The liver makes all the cholesterol  that the body needs. Extra (excess) cholesterol comes from the food that we eat. Cholesterol is carried from the liver by the blood through the blood vessels. If you have high cholesterol, deposits (plaques) may build up on the walls of your blood vessels (arteries). Plaques make the arteries narrower and stiffer. Cholesterol plaques increase your risk for heart attack and stroke. Work with your health care provider to keep your cholesterol levels in a healthy range. What increases the risk? This condition is more likely to develop in people who:  Eat foods that are high in animal fat (saturated fat) or cholesterol.  Are overweight.  Are not getting enough exercise.  Have a family history of high cholesterol. What are the signs or symptoms? There are no symptoms of this condition. How is this diagnosed? This condition may be diagnosed from the results of a blood test.  If you are older than age 41, your health care provider may check your cholesterol every 4-6 years.  You may be checked more often if you already have high cholesterol or other risk factors for heart disease. The blood test for cholesterol measures:  "Bad" cholesterol (LDL cholesterol). This is the main type of cholesterol that causes heart disease. The desired level for LDL is less than 100.  "Good" cholesterol (HDL cholesterol). This type helps to protect against heart disease by cleaning the arteries and carrying the LDL away. The desired level for HDL is 60 or higher.  Triglycerides. These are fats that the body can store or burn for energy. The desired number for triglycerides is lower than 150.  Total cholesterol. This is a measure of the total amount of cholesterol in your blood, including LDL cholesterol, HDL cholesterol, and triglycerides. A healthy number is less than 200. How is this treated? This condition is treated with diet changes, lifestyle changes, and medicines. Diet changes  This may include eating  more whole grains, fruits, vegetables, nuts, and fish.  This may also include cutting back on red meat and foods that have a lot of added sugar. Lifestyle changes  Changes may include getting at least 40 minutes of aerobic exercise 3 times a week. Aerobic exercises include walking, biking, and swimming. Aerobic exercise along with a healthy diet can help you maintain a healthy weight.  Changes may also include quitting smoking. Medicines  Medicines are usually given if diet and lifestyle changes have failed to reduce your cholesterol to healthy levels.  Your health care provider may prescribe a statin medicine. Statin medicines have been shown to reduce cholesterol, which can reduce the risk of heart disease. Follow these instructions at  home: Eating and drinking If told by your health care provider:  Eat chicken (without skin), fish, veal, shellfish, ground [place] breast, and round or loin cuts of red meat.  Do not eat fried foods or fatty meats, such as hot dogs and salami.  Eat plenty of fruits, such as apples.  Eat plenty of vegetables, such as broccoli, potatoes, and carrots.  Eat beans, peas, and lentils.  Eat grains such as barley, rice, couscous, and bulgur wheat.  Eat pasta without cream sauces.  Use skim or nonfat milk, and eat low-fat or nonfat yogurt and cheeses.  Do not eat or drink whole milk, cream, ice cream, egg yolks, or hard cheeses.  Do not eat stick margarine or tub margarines that contain trans fats (also called partially hydrogenated oils).  Do not eat saturated tropical oils, such as coconut oil and palm oil.  Do not eat cakes, cookies, crackers, or other baked goods that contain trans fats.  General instructions  Exercise as directed by your health care provider. Increase your activity level with activities such as gardening, walking, and taking the stairs.  Take over-the-counter and prescription medicines only as told by your health care  provider.  Do not use any products that contain nicotine or tobacco, such as cigarettes and e-cigarettes. If you need help quitting, ask your health care provider.  Keep all follow-up visits as told by your health care provider. This is important. Contact a health care provider if:  You are struggling to maintain a healthy diet or weight.  You need help to start on an exercise program.  You need help to stop smoking. Get help right away if:  You have chest pain.  You have trouble breathing. This information is not intended to replace advice given to you by your health care provider. Make sure you discuss any questions you have with your health care provider. Document Revised: 09/19/2017 Document Reviewed: 03/16/2016 Elsevier Patient Education  [place]. Diabetes Mellitus and Sick Day Management Blood sugar (glucose) can be difficult to control when you are sick. Common illnesses that can cause problems for people with diabetes (diabetes mellitus) include colds, fever, flu (influenza), nausea, vomiting, and diarrhea. These illnesses can cause stress and loss of body fluids (dehydration), and those issues can cause blood glucose levels to increase. Because of this, it is very important to take your insulin and diabetes medicines and eat some form of carbohydrate when you are sick. You should make a plan for days when you are sick (sick day plan) as part of your diabetes management plan. You and your health care provider should make this plan in advance. The following guidelines are intended to help you manage an illness that lasts for about 24 hours or less. Your health care provider may also give you more specific instructions. What do I need to do to manage my blood glucose?   Check your blood glucose every 2-4 hours, or as often as told by your health care provider.  Know your sick day treatment goals. Your target blood glucose levels may be different when you are sick.  If  you use insulin, take your usual dose. ? If your blood glucose continues to be too high, you may need to take an additional insulin dose as told by your health care provider.  If you use oral diabetes medicine, you may need to stop taking it if you are not able to eat or drink normally. Ask your health care provider about whether  you need to stop taking these medicines while you are sick.  If you use injectable hormone medicines other than insulin to control your diabetes, ask your health care provider about whether you need to stop taking these medicines while you are sick. What else can I do to manage my diabetes when I am sick? Check your ketones  If you have type 1 diabetes, check your urine ketones every 4 hours.  If you have type 2 diabetes, check your urine ketones as often as told by your health care provider. Drink fluids  Drink enough fluid to keep your urine clear or pale yellow. This is especially important if you have a fever, vomiting, or diarrhea. Those symptoms can lead to dehydration.  Follow any instructions from your health care provider about beverages to avoid. ? Do not drink alcohol, caffeine, or drinks that contain a lot of sugar. Take medicines as directed  Take-over-the-counter and prescription medicines only as told by your health care provider.  Check medicine labels for added sugars. Some medicines may contain sugar or types of sugars that can raise your blood glucose level. What foods can I eat when I am sick?  You need to eat some form of carbohydrates when you are sick. You should eat 45-50 grams (45-50 g) of carbohydrates every 3-4 hours until you feel better. All of the food choices below contain about 15 g of carbohydrates. Plan ahead and keep some of these foods around so you have them if you get sick.  4-6 oz (120-177 mL) carbonated beverage that contains sugar, such as regular (not diet) soda. You may be able to drink carbonated beverages more easily  if you open the beverage and let it sit at room temperature for a few minutes before drinking.   of a twin frozen ice pop.  4 oz (120 g) regular gelatin.  4 oz (120 mL) fruit juice.  4 oz (120 g) ice cream or frozen yogurt.  2 oz (60 g) sherbet.  8 oz (240 mL) clear broth or soup.  4 oz (120 g) regular custard.  4 oz (120 g) regular pudding.  8 oz (240 g) plain yogurt.  1 slice bread or toast.  6 saltine crackers.  5 vanilla wafers. Questions to ask your health care provider Consider asking the following questions so you know what to do on days when you are sick:  Should I adjust my diabetes medicines?  How often do I need to check my blood glucose?  What supplies do I need to manage my diabetes at home when I am sick?  What number can I call if I have questions?  What foods and drinks should I avoid? Contact a health care provider if:  You develop symptoms of diabetic ketoacidosis, such as: ? Fatigue. ? Weight loss. ? Excessive thirst. ? Light-headedness. ? Fruity or sweet-smelling breath. ? Excessive urination. ? Vision changes. ? Confusion or irritability. ? Nausea. ? Vomiting. ? Rapid breathing. ? Pain in the abdomen. ? Feeling flushed.  You are unable to drink fluids without vomiting.  You have any of the following for more than 6 hours: ? Nausea. ? Vomiting. ? Diarrhea.  Your blood glucose is at or above 240 mg/dL (13.3 mmol/L), even after you take an additional insulin dose.  You have a change in how you think, feel, or act (mental status).  You develop another serious illness.  You have been sick or have had a fever for 2 days or longer and  you are not getting better. Get help right away if:  Your blood glucose is lower than 54 mg/dL (3.0 mmol/L).  You have difficulty breathing.  You have moderate or high ketone levels in your urine.  You used emergency glucagon to treat low blood glucose. Summary  Blood sugar (glucose) can be  difficult to control when you are sick. Common illnesses that can cause problems for people with diabetes (diabetes mellitus) include colds, fever, flu (influenza), nausea, vomiting, and diarrhea.  Illnesses can cause stress and loss of body fluids (dehydration), and those issues can cause blood glucose levels to increase.  Make a plan for days when you are sick (sick day plan) as part of your diabetes management plan. You and your health care provider should make this plan in advance.  It is very important to take your insulin and diabetes medicines and to eat some form of carbohydrate when you are sick.  Contact your health care provider if have problems managing your blood glucose levels when you are sick, or if you have been sick or had a fever for 2 days or longer and are not getting better. This information is not intended to replace advice given to you by your health care provider. Make sure you discuss any questions you have with your health care provider. Document Revised: 06/14/2016 Document Reviewed: 06/14/2016 Elsevier Patient Education  [place].

## 2020-06-04 ENCOUNTER — Encounter: Payer: Self-pay | Admitting: Registered Nurse

## 2020-06-04 LAB — NOVEL CORONAVIRUS, NAA: SARS-CoV-2, NAA: NOT DETECTED

## 2020-06-04 NOTE — Progress Notes (Signed)
Negative covid PCR NAA test  patient notified via telephone and verbalized understanding.  Fully covid vaccinated.  She reported congestion worse.  Today no fever, less fatigue.  Felt hot yesterday still unable to find her thermometer.  NOT cleared to return to work yet as less than 24 hours since last "fever".  Pain in ankle right, right big toe and left hand joints now though and yesterday rashes developed. Friday glucose 235 fasting and forgot to take her metformin the night before.  Last blood sugar reading yesterday high 120s fasting and nonfasting.  Checking blood sugars once or twice a day.  Discussed healthy eating fresh/frozen fruits/vegetables 5 servings per day, lean protein and keeping added sugars under 25 gms per day.  Take her prescribed medications as written every day.  Drink water.  This rash different from her usual urticaria as it arises generally in response to temperature changes.  She took benadryl yesterday and rashes "red blotches" resolved none today. Discussed may use topical benadryl gel BID prn itching also.   Both legs 8/10 when pain at the worst today. Right leg minimally swollen today and left not at all. She has taken aleve 2 tabs yesterday and gabapentin which is prescribed for her DDD back doesn't take every day only if flare.  Currently 1/10 pain.  Previously right leg swelling Wed/Thursday earlier this week.  Traveled to [place] last week and remembers one of her feet swollen and unable to get her shoe on (air flight) right foot so wore flip flops didn't try left shoe.  Patient stated she remembers normal amount of fluids and food despite travel.  She has insect bites on ankles and itching also.  Denied red streaks from insect bites, purulent discharge or inflamed. Discussed ER or UC for leg [place] if knot/red streak/localized tenderness (rule out DVT).   Chest pressure and cough resolved.  Using nasal saline and flonase and benadryl/zyrtec.  Not using advair diskus or albuterol in  previous 48 hours.  Denied any bullseye rashes, ticks removed in previous month.  Discussed maximum dose aleve 500mg  po BID take with food.  Patient reported urine gold in toilet encouraged her to increase her noncaffeinated no added sugar fluid intake to keep urine pale yellow clear and urinating every 2-4 hours when awake.  Elevate legs when sitting.  Encouraged her to do heel pumps, walk in home hourly when awake.  Discussed symptoms of DVT.  Denied positive homan's sign/pain with flexion or extension of feet in her bilateral calves, denied knot in legs/hot spot/redness/localized tenderness.  Discussed prolonged sitting/lying down can worsen muskuloskeletal pain and gentle AROM/normal ADLS should be maintained.  Discussed pain studies have show back pain worsens with decreased activity.  Discussed may continue benadryl prn OTC or increase zyrtec to 10mg  po BID prn itching.  May use cryotherapy 15 minutes QID prn itching/swelling.  Discussed with patient I recommended re-evaluation this week and repeat covid testing if new or worsening symptoms.  Stay home until fever free off aleve x 24 hours.  Patient unable to still find thermometer at home recently moved.  Reminded patient ESW open tomorrow and PA Ethan working M, Tu, Wed am & Th and I am scheduled Wed pm and Fri this week.  Patient verbalized understanding information/instructions, agreed with plan of care and had no  further questions at this time.

## 2020-08-01 ENCOUNTER — Other Ambulatory Visit: Payer: Self-pay

## 2020-08-01 DIAGNOSIS — Z1231 Encounter for screening mammogram for malignant neoplasm of breast: Secondary | ICD-10-CM

## 2020-08-16 ENCOUNTER — Other Ambulatory Visit (HOSPITAL_COMMUNITY): Payer: Self-pay | Admitting: Podiatry

## 2020-08-16 ENCOUNTER — Other Ambulatory Visit: Payer: Self-pay | Admitting: Podiatry

## 2020-08-16 DIAGNOSIS — M2550 Pain in unspecified joint: Secondary | ICD-10-CM

## 2020-08-16 DIAGNOSIS — M722 Plantar fascial fibromatosis: Secondary | ICD-10-CM

## 2020-08-16 DIAGNOSIS — M25571 Pain in right ankle and joints of right foot: Secondary | ICD-10-CM

## 2020-08-16 DIAGNOSIS — E1142 Type 2 diabetes mellitus with diabetic polyneuropathy: Secondary | ICD-10-CM

## 2020-08-16 DIAGNOSIS — M79671 Pain in right foot: Secondary | ICD-10-CM

## 2020-08-16 DIAGNOSIS — M7671 Peroneal tendinitis, right leg: Secondary | ICD-10-CM

## 2020-08-16 DIAGNOSIS — M7661 Achilles tendinitis, right leg: Secondary | ICD-10-CM

## 2020-08-16 DIAGNOSIS — M792 Neuralgia and neuritis, unspecified: Secondary | ICD-10-CM

## 2020-08-21 ENCOUNTER — Other Ambulatory Visit: Payer: Self-pay

## 2020-08-21 ENCOUNTER — Ambulatory Visit (HOSPITAL_COMMUNITY)
Admission: RE | Admit: 2020-08-21 | Discharge: 2020-08-21 | Disposition: A | Discharge status: Home / Self Care | Payer: BC Managed Care – PPO | Source: Ambulatory Visit | Attending: Podiatry | Admitting: Podiatry

## 2020-08-21 DIAGNOSIS — M722 Plantar fascial fibromatosis: Secondary | ICD-10-CM | POA: Diagnosis present

## 2020-08-21 DIAGNOSIS — M7671 Peroneal tendinitis, right leg: Secondary | ICD-10-CM | POA: Diagnosis present

## 2020-08-21 DIAGNOSIS — M79671 Pain in right foot: Secondary | ICD-10-CM | POA: Diagnosis present

## 2020-08-21 DIAGNOSIS — E1142 Type 2 diabetes mellitus with diabetic polyneuropathy: Secondary | ICD-10-CM

## 2020-08-21 DIAGNOSIS — M792 Neuralgia and neuritis, unspecified: Secondary | ICD-10-CM | POA: Diagnosis present

## 2020-08-21 DIAGNOSIS — M2550 Pain in unspecified joint: Secondary | ICD-10-CM | POA: Diagnosis present

## 2020-08-21 DIAGNOSIS — M25571 Pain in right ankle and joints of right foot: Secondary | ICD-10-CM | POA: Insufficient documentation

## 2020-08-21 DIAGNOSIS — M7661 Achilles tendinitis, right leg: Secondary | ICD-10-CM | POA: Insufficient documentation

## 2020-09-07 ENCOUNTER — Ambulatory Visit: Payer: BC Managed Care – PPO

## 2020-09-21 ENCOUNTER — Ambulatory Visit: Payer: BC Managed Care – PPO

## 2020-09-28 ENCOUNTER — Ambulatory Visit: Payer: BC Managed Care – PPO

## 2020-09-30 HISTORY — PX: ANKLE SURGERY: SHX546

## 2020-10-05 ENCOUNTER — Ambulatory Visit: Payer: BC Managed Care – PPO

## 2020-10-12 ENCOUNTER — Ambulatory Visit: Payer: BC Managed Care – PPO

## 2020-10-19 ENCOUNTER — Ambulatory Visit: Payer: BC Managed Care – PPO

## 2020-10-26 ENCOUNTER — Ambulatory Visit: Payer: BC Managed Care – PPO

## 2020-11-02 ENCOUNTER — Ambulatory Visit: Payer: BC Managed Care – PPO

## 2020-11-09 ENCOUNTER — Other Ambulatory Visit: Payer: Self-pay

## 2020-11-09 ENCOUNTER — Ambulatory Visit: Payer: BC Managed Care – PPO

## 2020-11-09 DIAGNOSIS — M62838 Other muscle spasm: Secondary | ICD-10-CM | POA: Diagnosis present

## 2020-11-09 DIAGNOSIS — M533 Sacrococcygeal disorders, not elsewhere classified: Secondary | ICD-10-CM | POA: Insufficient documentation

## 2020-11-09 DIAGNOSIS — R293 Abnormal posture: Secondary | ICD-10-CM | POA: Diagnosis present

## 2020-11-09 NOTE — Therapy (Addendum)
[place] MAIN [facility] SERVICES [address] [place], [place], [address] Phone: [phone]   Fax:  [phone]  Physical Therapy Evaluation  The patient has been informed of current processes in place at Outpatient Rehab to protect patients from Covid-19 exposure including social distancing, schedule modifications, and new cleaning procedures. After discussing their particular risk with a therapist based on the patient's personal risk factors, the patient has decided to proceed with in-person therapy.   Patient Details  Name: Eileen Chaney MRN: [id number] Date of Birth: Dec 31, 1975 No data recorded  Encounter Date: 11/09/2020   PT End of Session - 11/13/20 0906    Visit Number 1    Number of Visits 12    Date for PT Re-Evaluation 02/02/21    Authorization Type BC[employer]    Authorization Time Period 11/09/20 through 02/02/21    Authorization - Visit Number 1    Authorization - Number of Visits 10    Progress Note Due on Visit 10    PT Start Time 0730    PT Stop Time 0830    PT Time Calculation (min) 60 min    Activity Tolerance Patient tolerated treatment well;No increased pain    Behavior During Therapy [facility] for tasks assessed/performed           No past medical history on file.  Past Surgical History:  Procedure Laterality Date  . ABDOMINAL HYSTERECTOMY     uterine fibroids 2018    There were no vitals filed for this visit.    Pelvic Floor Physical Therapy Evaluation and Assessment  SCREENING  Falls in last 6 mo: no   Red Flags:  Have you had any night sweats? no Unexplained weight loss? no Saddle anesthesia? no Unexplained changes in bowel or bladder habits? no  SUBJECTIVE  Patient reports: She had an abdominal hysterectomy in ~ 2018 due to fibroids and everything seems to have started then. This has limited ow much she drinks, has restricted her water intake to ~ 60 oz. Per day. She "tries to go" ~ every 1.5 to 2  hours between meetings. She is in the process of getting a divorce.   Has been in a boot since September has LBP that she was seen for 1 year ago. Everything feels tight right now. When she is lying down and "rolls a certain way" she has a sharp pain where her c-section scar is. Her R>L posterior hip gets really tight and when she stretches it it helps with the back pain.   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   Recent Procedures/Tests/Findings:  Had surgery on her R foot to debride a joint and re-attach a ligament from repetitive injury from years of playing soccer.  Obstetrical History: Had 3 c-sections.  Gynecological History: Fibroids leading to Abdominal Hysterectomy  Urinary History: Urinating ~ every 1.5 to 2 hours with UUI, Nocturia x1  Gastrointestinal History: Has a BM every 1-2 days but is straining and has hemmorhoids.    Sexual activity/pain: Was having pain with both initial penetration and deeper thrusting. Not currently sexually active.  Location of pain: LBP  Current pain:  2/10  Max pain:  2/10 Least pain:  1/10 Nature of pain: Dull ache   Patient Goals: To not feel tied to a bathroom and be able  to drink adequate water and exercise.   OBJECTIVE  Posture/Observations: Sitting:  feet reaching for the floor Standing: 5'8" and R handed, shoulders level anterior pelvic tilt   Palpation/Segmental Motion/Joint Play: TTP to diaphragm and PSIS B.  Special tests:   Scoliosis: none noted but test limited by Pt. Wearing a boot on LLE  Range of Motion/Flexibilty:  Spine: R SB WNL, L SB ~ 3 fingers from knee with pain on L. R ROT WNL, L ROT ~ 25% reduced. Forward bend fingertips to floor.  Hips:   Strength/MMT:  Deferred to  follow-up LE MMT  LE MMT Left Right  Hip flex:  (L2) /5 /5  Hip ext: /5 /5  Hip abd: /5 /5  Hip add: /5 /5  Hip IR /5 /5  Hip ER /5 /5     Abdominal:  Palpation: TTP at diaphragm Diastasis: ~ 3 finger separation at umbilicus  Pelvic Floor External Exam: Deferred to follow-up Introitus Appears:  Skin integrity:  Palpation: Cough: Prolapse visible?: Scar mobility:  Internal Vaginal Exam: Strength (PERF):  Symmetry: Palpation: Prolapse:   Internal Rectal Exam: Strength (PERF): Symmetry: Palpation: Prolapse:   Gait Analysis: Deferred to follow-up   Pelvic Floor Outcome Measures:  FOTO PFDI Prolapse: 42, Urinary Problem: 46, PFDI Urinary: 42   INTERVENTIONS THIS SESSION: Self-care: Educated on the structure and function of the pelvic floor in relation to their symptoms as well as the POC, and initial HEP in order to set patient expectations and understanding from which we will build on in the future sessions. Also educated on seated posture and self c-section scar release  Total time: 60 min.                  Objective measurements completed on examination: See above findings.                 PT Short Term Goals - 11/13/20 0914      PT SHORT TERM GOAL #1   Title Patient will demonstrate coordinated diaphragmatic breathing with pelvic tilts to demonstrate improved control of diaphragm and TA, to allow for further strengthening of core musculature and decreased pelvic floor spasm.    Baseline poor coordination of deep-core (3 finger diastasis)    Time 6    Period Weeks    Status New    Target Date 12/21/20      PT SHORT TERM GOAL #2   Title Patient will demonstrate improved sitting and standing posture to demonstrate learning and decrease stress on the pelvic floor with functional activity.    Baseline hyperlordotic/anterior pelvic tilt.    Time 6    Period Weeks    Status New    Target Date 12/21/20      PT SHORT TERM GOAL  #3   Title Patient will demonstrate HEP x1 in the clinic to demonstrate understanding and proper form to allow for further improvement.    Baseline Pt. lacks knowledge of which therapeutic exercises will decrease her Sx.    Time 6    Period Weeks    Status New    Target Date 12/21/20             PT Long Term Goals - 11/13/20 0918      PT LONG TERM GOAL #1   Title Patient will describe pain no greater than 1/10 during pelvic exam/intercourse and walking/standing for ~ 1 hr. to demonstrate improved functional ability.    Baseline has LBP with worsens when on  her feet for extended time, has been in a boot for ~ 5 months    Time 12    Period Weeks    Status New    Target Date 02/02/21      PT LONG TERM GOAL #2   Title Patient will report no episodes of UUI over the course of the prior two weeks to demonstrate improved functional ability.    Baseline She is having UUI daily    Time 12    Period Weeks    Status New    Target Date 02/02/21      PT LONG TERM GOAL #3   Title Pt. will improve in FOTO score by 10 points in each category to demonstrate improved function.    Baseline FOTO PFDI Prolapse: 42, Urinary Problem: 46, PFDI Urinary: 42    Time 12    Period Weeks    Status New    Target Date 02/02/21                  Plan - 11/13/20 0906    Clinical Impression Statement Pt. is a 45 y/o female who presents today with cheif c/o UUI and LBP. Her PMH is significant for Abdominal Hysterectomy, HTN, Diabetes, asthma, Lumbar DDD, Idiopathic Urticaria, GAD, HLD, Leukocytosis, Morbid Obesity, Depression, and Spinal stenosis of lumbar region. Her Clinical exam revealed significant c-section scar restriction, andterior pelvic tilt/hyperlordosis and hip-flexors through B hip-flexors and lumbar extensors. Her history also strongly suggests PFM spasms that have been exacerbated by increased stress. She will benefit from skilled pelvic health PT to address the noted deficits and to  continue to assess for and address any other potential causes of Sx.    Personal Factors and Comorbidities Comorbidity 3+    Comorbidities Abdominal Hysterectomy, HTN, Diabetes, asthma, Lumbar DDD, Idiopathic Urticaria, GAD, HLD, Leukocytosis, Morbid Obesity, Depression, Spinal stenosis of lumbar region    Examination-Activity Limitations Toileting;Continence    Examination-Participation Restrictions Interpersonal Relationship;Occupation;Community Activity    Stability/Clinical Decision Making Evolving/Moderate complexity    Clinical Decision Making Moderate    Rehab Potential Good    PT Frequency 1x / week    PT Duration Other (comment)   10 weeks   PT Treatment/Interventions ADLs/Self Care Home Management;Biofeedback;Electrical Stimulation;Moist Heat;Ultrasound;Therapeutic activities;Functional mobility training;Gait training;Therapeutic exercise;Neuromuscular re-education;Patient/family education;Manual techniques;Scar mobilization;Dry needling;Passive range of motion;Taping;Spinal Manipulations;Joint Manipulations    PT Next Visit Plan release B diaphragm and hip-flexors, teach deep-core recruitment, posture in walking and standing.    PT Home Exercise Plan self c-section release, seated posture.    Consulted and Agree with Plan of Care Patient           Patient will benefit from skilled therapeutic intervention in order to improve the following deficits and impairments:  Decreased activity tolerance,Increased fascial restricitons,Improper body mechanics,Pain,Obesity,Postural dysfunction,Increased muscle spasms,Decreased scar mobility,Decreased coordination  Visit Diagnosis: Sacrococcygeal disorders, not elsewhere classified  Abnormal posture  Other muscle spasm     Problem List Patient Active Problem List   Diagnosis Date Noted  . Gestational diabetes 06/02/2020  . Arthritis 10/11/2019  . DDD (degenerative disc disease), lumbar 10/11/2019  . Hyperlipidemia 10/11/2019  .  Idiopathic urticaria 10/11/2019  . Recurrent major depressive disorder, in partial remission ([place]) 10/11/2019  . Spinal stenosis of lumbar region without neurogenic claudication 10/11/2019  . Type 2 diabetes mellitus without complication, with long-term current use of insulin ([place]) 10/11/2019  . Elevated lactic acid level 09/29/2017  . Leukocytosis 09/29/2017  . Diabetes ([place]) 09/28/2017  . Hypertension  09/28/2017  . Mild asthma without complication 41/99/1444  . Morbid obesity ([place]) 09/28/2017  . Thalassemia 09/28/2017  . GAD (generalized anxiety disorder) 07/25/2014  . 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

## 2020-11-09 NOTE — Patient Instructions (Signed)
  When seated, you want to maintain pelvic neutral with the shoulders gently down and back and ears in line with your shoulders. A lumbar roll such as the one below or a home-made towel-roll can be used for this purpose. Even Olympic athletes can only maintain proper seated posture for about 10 minutes without support!    Pictured: The Original [employer] Lumbar Roll    Lay your hand flat with your pinky-side along your scar and scoop gently up toward your head pulling just the skin until you feel a slight discomfort/burning and take deep breaths for at least 2 min. (~ one song) Or until you feel the discomfort release.   Do this daily until you no longer feel discomfort with moderate tension in different directions.

## 2020-11-13 NOTE — Addendum Note (Signed)
Addended by: Letitia Libra T on: 11/13/2020 09:24 AM   Modules accepted: Orders

## 2020-11-14 ENCOUNTER — Ambulatory Visit: Payer: BC Managed Care – PPO

## 2020-11-14 ENCOUNTER — Other Ambulatory Visit: Payer: Self-pay

## 2020-11-14 DIAGNOSIS — M533 Sacrococcygeal disorders, not elsewhere classified: Secondary | ICD-10-CM

## 2020-11-14 DIAGNOSIS — R293 Abnormal posture: Secondary | ICD-10-CM

## 2020-11-14 DIAGNOSIS — M62838 Other muscle spasm: Secondary | ICD-10-CM

## 2020-11-14 NOTE — Therapy (Signed)
[name] [facility] SERVICES [address] [place], [place], [address] Phone: [phone]   Fax:  [phone]  Physical Therapy Treatment  The patient has been informed of current processes in place at Outpatient Rehab to protect patients from Covid-19 exposure including social distancing, schedule modifications, and new cleaning procedures. After discussing their particular risk with a therapist based on the patient's personal risk factors, the patient has decided to proceed with in-person therapy.   Patient Details  Name: Eileen Chaney MRN: [id number] Date of Birth: 23-May-1976 No data recorded  Encounter Date: 11/14/2020   PT End of Session - 11/14/20 0831    Visit Number 2    Number of Visits 12    Date for PT Re-Evaluation 02/02/21    Authorization Type BCBS    Authorization Time Period 11/09/20 through 02/02/21    Authorization - Visit Number 2    Authorization - Number of Visits 10    Progress Note Due on Visit 10    PT Start Time 0730    PT Stop Time 0830    PT Time Calculation (min) 60 min    Activity Tolerance Patient tolerated treatment well;No increased pain    Behavior During Therapy [facility] for tasks assessed/performed           No past medical history on file.  Past Surgical History:  Procedure Laterality Date  . ABDOMINAL HYSTERECTOMY     uterine fibroids 2018    There were no vitals filed for this visit.   Pelvic Floor Physical Therapy Treatment Note  SCREENING  Changes in medications, allergies, or medical history?: none    SUBJECTIVE  Patient reports: No changes. She bought a new office chair and lumbar support, has been doing the c-section scar release and feels release when she does.  Precautions:  Abdominal Hysterectomy, HTN, Diabetes, asthma, Lumbar DDD, Idiopathic Urticaria, GAD, HLD, Leukocytosis, Morbid Obesity, Depression, Spinal stenosis of lumbar region  Sexual activity/pain: Was having  pain with both initial penetration and deeper thrusting. Not currently sexually active.  Location of pain: LBP  Current pain: 2/10  Max pain: 2/10 Least pain: 1/10 Nature of pain:Dull ache   **0/10 pain following treatment.  Patient Goals: To not feel tied to a bathroom and be able to drink adequate water and exercise.   OBJECTIVE  Changes in: Posture/Observations:  ASIS and ankles appear nearly level in supine, further investigation needed.  Range of Motion/Flexibilty:    Strength/MMT:  LE MMT:  Pelvic floor:  Abdominal:   Palpation: TTP to B Iliacus and diaphragm.  Gait Analysis:  INTERVENTIONS THIS SESSION: Manual: Performed TP release and STM to B diaphragm and iliacus to decrease spasm and pain and allow for improved balance of musculature for improved function and decreased symptoms.  Dry-needle: Performed TPDN with a .30x174mm needle and standard approach as described below to decrease spasm and pain and allow for improved balance of musculature for improved function and decreased symptoms.  Therex: educated on and practiced diaphragmatic breathing, exertional breathing and seated pelvic tilts to improve strength of muscles opposing tight musculature to allow reciprocal inhibition to improve balance of musculature surrounding the pelvis and improve overall posture for optimal musculature length-tension relationship and function.  Total time: 60 min.                       Trigger Point Dry Needling - 11/14/20 0001    Consent Given? Yes    Education  Handout Provided No    Muscles Treated Back/Hip Iliacus    Dry Needling Comments Bilateral    Iliacus Response Twitch response elicited;Palpable increased muscle length                  PT Short Term Goals - 11/13/20 0914      PT SHORT TERM GOAL #1   Title Patient will demonstrate coordinated diaphragmatic breathing with pelvic tilts to demonstrate improved control of diaphragm  and TA, to allow for further strengthening of core musculature and decreased pelvic floor spasm.    Baseline poor coordination of deep-core (3 finger diastasis)    Time 6    Period Weeks    Status New    Target Date 12/21/20      PT SHORT TERM GOAL #2   Title Patient will demonstrate improved sitting and standing posture to demonstrate learning and decrease stress on the pelvic floor with functional activity.    Baseline hyperlordotic/anterior pelvic tilt.    Time 6    Period Weeks    Status New    Target Date 12/21/20      PT SHORT TERM GOAL #3   Title Patient will demonstrate HEP x1 in the clinic to demonstrate understanding and proper form to allow for further improvement.    Baseline Pt. lacks knowledge of which therapeutic exercises will decrease her Sx.    Time 6    Period Weeks    Status New    Target Date 12/21/20             PT Long Term Goals - 11/13/20 0918      PT LONG TERM GOAL #1   Title Patient will describe pain no greater than 1/10 during pelvic exam/intercourse and walking/standing for ~ 1 hr. to demonstrate improved functional ability.    Baseline has LBP with worsens when on her feet for extended time, has been in a boot for ~ 5 months    Time 12    Period Weeks    Status New    Target Date 02/02/21      PT LONG TERM GOAL #2   Title Patient will report no episodes of UUI over the course of the prior two weeks to demonstrate improved functional ability.    Baseline She is having UUI daily    Time 12    Period Weeks    Status New    Target Date 02/02/21      PT LONG TERM GOAL #3   Title Pt. will improve in FOTO score by 10 points in each category to demonstrate improved function.    Baseline FOTO PFDI Prolapse: 42, Urinary Problem: 46, PFDI Urinary: 42    Time 12    Period Weeks    Status New    Target Date 02/02/21                 Plan - 11/14/20 0831    Clinical Impression Statement Pt. Responded well to all interventions today,  demonstrating decreased spasm and pain and improved diaphragm excursion and breathing pattern, as well as understanding and correct performance of all education and exercises provided today. They will continue to benefit from skilled physical therapy to work toward remaining goals and maximize function as well as decrease likelihood of symptom increase or recurrence.    PT Next Visit Plan teach deep-core recruitment, posture in walking and standing.    PT Home Exercise Plan self c-section release, seated posture, seated pelvic  tilts, diaphragmatic breathing.    Consulted and Agree with Plan of Care Patient           Patient will benefit from skilled therapeutic intervention in order to improve the following deficits and impairments:     Visit Diagnosis: Sacrococcygeal disorders, not elsewhere classified  Abnormal posture  Other muscle spasm     Problem List Patient Active Problem List   Diagnosis Date Noted  . Gestational diabetes 06/02/2020  . Arthritis 10/11/2019  . DDD (degenerative disc disease), lumbar 10/11/2019  . Hyperlipidemia 10/11/2019  . Idiopathic urticaria 10/11/2019  . Recurrent major depressive disorder, in partial remission ([place]) 10/11/2019  . Spinal stenosis of lumbar region without neurogenic claudication 10/11/2019  . Type 2 diabetes mellitus without complication, with long-term current use of insulin ([place]) 10/11/2019  . Elevated lactic acid level 09/29/2017  . Leukocytosis 09/29/2017  . Diabetes ([place]) 09/28/2017  . Hypertension 09/28/2017  . Mild asthma without complication 75/44/9201  . Morbid obesity ([place]) 09/28/2017  . Thalassemia 09/28/2017  . GAD (generalized anxiety disorder) 07/25/2014  . 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 [place], [place], [address] Phone:  [phone]   Fax:  [phone]  Name: EVER HALBERG MRN: [id number] Date of Birth: [birth year]

## 2020-11-14 NOTE — Patient Instructions (Signed)
   Do 2 sets of 15 tilts per day. Breathe in when you tilt forward (A) and out when you tuck under (B).  Stabilization: Diaphragmatic Breathing    Lie with knees bent, feet flat. Place one hand on stomach, other on chest. Breathe deeply through nose, lifting belly hand without any motion of hand on chest. Practice for ~ 5 min. Per night and throughout the day if you feel yourself getting tense or notice that you are chest breathing.

## 2020-11-16 ENCOUNTER — Ambulatory Visit: Payer: BC Managed Care – PPO

## 2020-11-23 ENCOUNTER — Ambulatory Visit: Payer: BC Managed Care – PPO

## 2020-11-28 ENCOUNTER — Ambulatory Visit: Payer: BC Managed Care – PPO

## 2020-11-28 ENCOUNTER — Other Ambulatory Visit: Payer: Self-pay

## 2020-11-28 DIAGNOSIS — M62838 Other muscle spasm: Secondary | ICD-10-CM | POA: Diagnosis present

## 2020-11-28 DIAGNOSIS — R293 Abnormal posture: Secondary | ICD-10-CM | POA: Insufficient documentation

## 2020-11-28 DIAGNOSIS — M533 Sacrococcygeal disorders, not elsewhere classified: Secondary | ICD-10-CM

## 2020-11-28 NOTE — Therapy (Signed)
[place] MAIN [facility] SERVICES [address] [place], [place], [address] Phone: [phone]   Fax:  [phone]  Physical Therapy Treatment  The patient has been informed of current processes in place at Outpatient Rehab to protect patients from Covid-19 exposure including social distancing, schedule modifications, and new cleaning procedures. After discussing their particular risk with a therapist based on the patient's personal risk factors, the patient has decided to proceed with in-person therapy.   Patient Details  Name: Eileen Chaney MRN: [id number] Date of Birth: [birth year] No data recorded  Encounter Date: 11/28/2020   PT End of Session - 11/28/20 1221    Visit Number 3    Number of Visits 12    Date for PT Re-Evaluation 02/02/21    Authorization Type BCBS    Authorization Time Period 11/09/20 through 02/02/21    Authorization - Visit Number 3    Authorization - Number of Visits 10    Progress Note Due on Visit 10    PT Start Time 6812    PT Stop Time 0835    PT Time Calculation (min) 58 min    Activity Tolerance Patient tolerated treatment well;No increased pain    Behavior During Therapy [facility] for tasks assessed/performed           No past medical history on file.  Past Surgical History:  Procedure Laterality Date  . ABDOMINAL HYSTERECTOMY     uterine fibroids 2018    There were no vitals filed for this visit.   Pelvic Floor Physical Therapy Treatment Note  SCREENING  Changes in medications, allergies, or medical history?: none    SUBJECTIVE  Patient reports: Was having less urgency, went a whole week without having to wear a pad and then Sunday and Monday of this week have started to be an issue again. Got a cortisone shot in her shoulder over the weekend for her shoulder. She has had some return in difficulty of breathing.  Precautions:  Abdominal Hysterectomy, HTN, Diabetes, asthma, Lumbar DDD, Idiopathic  Urticaria, GAD, HLD, Leukocytosis, Morbid Obesity, Depression, Spinal stenosis of lumbar region  Sexual activity/pain: Was having pain with both initial penetration and deeper thrusting. Not currently sexually active.  Location of pain: LBP  Current pain: 0/10  Max pain: 2/10 Least pain: 0/10 Nature of pain:Dull ache   **0/10 pain following treatment in LB.  Patient Goals: To not feel tied to a bathroom and be able to drink adequate water and exercise.   OBJECTIVE  Changes in: Posture/Observations:  ASIS and ankles appear nearly level in supine, further investigation needed.  TODAY: hyperlordotic, PSIS appear level in standing  Range of Motion/Flexibilty:    Strength/MMT:  LE MMT:  Pelvic floor:  Abdominal:   Palpation: TTP to B diaphragm  Gait Analysis:  INTERVENTIONS THIS SESSION: Manual: Performed TP release to B diaphragm to decrease spasm and pain and allow for improved balance of musculature for improved function and decreased symptoms.  Theract: educated on the connection between stress/energy and PFM tension and how to use body mechanics to prevent storing of energy as muscular tension, how to do progressive relaxation to intentionally release extra energy to prevent pain/spasm and urinary frequency.  Therex: reviewed diaphragmatic breathing,and educated on 3-way wall stretch To maintain and improve muscle length and allow for improved balance of musculature for long-term symptom relief. Educated on how to walk on a treadmill or use ellyptical without increasing lordosis and negative impact on the LB and PFM.  Total time: 60 min.                               PT Short Term Goals - 11/13/20 0914      PT SHORT TERM GOAL #1   Title Patient will demonstrate coordinated diaphragmatic breathing with pelvic tilts to demonstrate improved control of diaphragm and TA, to allow for further strengthening of core musculature and  decreased pelvic floor spasm.    Baseline poor coordination of deep-core (3 finger diastasis)    Time 6    Period Weeks    Status New    Target Date 12/21/20      PT SHORT TERM GOAL #2   Title Patient will demonstrate improved sitting and standing posture to demonstrate learning and decrease stress on the pelvic floor with functional activity.    Baseline hyperlordotic/anterior pelvic tilt.    Time 6    Period Weeks    Status New    Target Date 12/21/20      PT SHORT TERM GOAL #3   Title Patient will demonstrate HEP x1 in the clinic to demonstrate understanding and proper form to allow for further improvement.    Baseline Pt. lacks knowledge of which therapeutic exercises will decrease her Sx.    Time 6    Period Weeks    Status New    Target Date 12/21/20             PT Long Term Goals - 11/13/20 0918      PT LONG TERM GOAL #1   Title Patient will describe pain no greater than 1/10 during pelvic exam/intercourse and walking/standing for ~ 1 hr. to demonstrate improved functional ability.    Baseline has LBP with worsens when on her feet for extended time, has been in a boot for ~ 5 months    Time 12    Period Weeks    Status New    Target Date 02/02/21      PT LONG TERM GOAL #2   Title Patient will report no episodes of UUI over the course of the prior two weeks to demonstrate improved functional ability.    Baseline She is having UUI daily    Time 12    Period Weeks    Status New    Target Date 02/02/21      PT LONG TERM GOAL #3   Title Pt. will improve in FOTO score by 10 points in each category to demonstrate improved function.    Baseline FOTO PFDI Prolapse: 42, Urinary Problem: 46, PFDI Urinary: 42    Time 12    Period Weeks    Status New    Target Date 02/02/21                 Plan - 11/28/20 1221    Clinical Impression Statement Pt. Responded well to all interventions today, demonstrating improved gait mechanics, ease of breathing, decreased  spasm, as well as understanding and correct performance of all education and exercises provided today. They will continue to benefit from skilled physical therapy to work toward remaining goals and maximize function as well as decrease likelihood of symptom increase or recurrence.    PT Next Visit Plan teach deep-core recruitment, posture in walking and standing.    PT Home Exercise Plan self c-section release, seated posture, seated pelvic tilts, diaphragmatic breathing,posture in walking and standing, back-relax, 3-way wall stretch, progressive relaxation technique.  Consulted and Agree with Plan of Care Patient           Patient will benefit from skilled therapeutic intervention in order to improve the following deficits and impairments:     Visit Diagnosis: Sacrococcygeal disorders, not elsewhere classified  Abnormal posture  Other muscle spasm     Problem List Patient Active Problem List   Diagnosis Date Noted  . Gestational diabetes 06/02/2020  . Arthritis 10/11/2019  . DDD (degenerative disc disease), lumbar 10/11/2019  . Hyperlipidemia 10/11/2019  . Idiopathic urticaria 10/11/2019  . Recurrent major depressive disorder, in partial remission ([place]) 10/11/2019  . Spinal stenosis of lumbar region without neurogenic claudication 10/11/2019  . Type 2 diabetes mellitus without complication, with long-term current use of insulin ([place]) 10/11/2019  . Elevated lactic acid level 09/29/2017  . Leukocytosis 09/29/2017  . Diabetes ([place]) 09/28/2017  . Hypertension 09/28/2017  . Mild asthma without complication 04/79/9872  . Morbid obesity ([place]) 09/28/2017  . Thalassemia 09/28/2017  . GAD (generalized anxiety disorder) 07/25/2014  . 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 [place], [place],  [address] Phone: [phone]   Fax:  [phone]  Name: ASHTYNN BERKE MRN: [id number] Date of Birth: Dec 09, 1975

## 2020-11-28 NOTE — Patient Instructions (Signed)
  Your "usual posture" looks like the kyphotic-lordotic picture above, we want it to look more like the first picture labeled "normal" Where the earlobe, tip of the shoulder, hip, and knee are all in a straight line.  Make sure that your knees are un-locked but not bent.  Draw up as if an imaginary string were tied to the crown of your head stretching you up tall. Gently pull shoulders back and down without "pushing" chest out. Pull chin in slightly as if you were a turtle pulling into your shell until ears line up with tips of shoulders.  On a treadmill, etc. Make sure you hinge forward at the hip to account for the angle of incline. DON'T HOLD ON!  3-Way Wall Stretches for Pelvic Floor Lengthening   Bring bottom close to the wall and gently press straighten knees to feel a stretch down the back of you thighs. Hold while taking 5 deep belly breaths and feeling the pelvic floor relax and lower on each inhale.    Let your legs fall to the side to feel a stretch on the inside of your thighs. If the stretch is too intense you can use a pillow to take some of the weight off by wedging it on the outside of your hips. Hold while taking 5 deep belly breaths and feeling the pelvic floor relax and lower on each inhale.    Slide feet down the wall and move hips slightly away from the wall and then let your knees fall to the sides so you feel a stretch on the inside of the thighs near your groin. Hold while taking 5 deep belly breaths and feeling the pelvic floor relax and lower on each inhale.     *Perform each stretch in sequence 3 times, once a day  Progressive relaxation technique:  Start by tensing all the muscles in your right foot, then calf, then upper leg, glute, low back and hold for 2 deep breaths, release the tension and let the limb melt into the table. Next start with the left leg and do the same progression, foot, lower leg, upper leg, glute, low back, then release. Then the Right hand  make a fist, tense the forearm, the upper arm, shoulder, hold for two breaths, and release letting it melt into the ground, repeat on the left. Finally, start with both feet, both calves, both upper legs, both glutes, lower abdomen, upper abdomen, make fists, tighten forearms, upper arms, shoulders, face. Hold for two breaths and then completely melt into the floor.

## 2020-12-07 ENCOUNTER — Ambulatory Visit: Payer: BC Managed Care – PPO

## 2020-12-14 ENCOUNTER — Other Ambulatory Visit: Payer: Self-pay

## 2020-12-14 ENCOUNTER — Ambulatory Visit: Payer: BC Managed Care – PPO

## 2020-12-14 DIAGNOSIS — R293 Abnormal posture: Secondary | ICD-10-CM

## 2020-12-14 DIAGNOSIS — M533 Sacrococcygeal disorders, not elsewhere classified: Secondary | ICD-10-CM

## 2020-12-14 DIAGNOSIS — M62838 Other muscle spasm: Secondary | ICD-10-CM

## 2020-12-14 NOTE — Therapy (Addendum)
[place] MAIN [facility] SERVICES [address] [place], [place], [address] Phone: [phone]   Fax:  [phone]  Physical Therapy Treatment  The patient has been informed of current processes in place at Outpatient Rehab to protect patients from Covid-19 exposure including social distancing, schedule modifications, and new cleaning procedures. After discussing their particular risk with a therapist based on the patient's personal risk factors, the patient has decided to proceed with in-person therapy.   Patient Details  Name: Eileen Chaney MRN: [id number] Date of Birth: 1976/08/09 No data recorded  Encounter Date: 12/14/2020   PT End of Session - 12/14/20 0821    Visit Number 4    Number of Visits 12    Date for PT Re-Evaluation 02/02/21    Authorization Type BCBS    Authorization Time Period 11/09/20 through 02/02/21    Authorization - Visit Number 4    Authorization - Number of Visits 10    Progress Note Due on Visit 10    PT Start Time 0745    PT Stop Time 0830    PT Time Calculation (min) 45 min    Activity Tolerance Patient tolerated treatment well;No increased pain    Behavior During Therapy [facility] for tasks assessed/performed           No past medical history on file.  Past Surgical History:  Procedure Laterality Date  . ABDOMINAL HYSTERECTOMY     uterine fibroids 2018    There were no vitals filed for this visit.  Pelvic Floor Physical Therapy Treatment Note  SCREENING  Changes in medications, allergies, or medical history?: none    SUBJECTIVE  Patient reports: Still not as bad as the worst point but not doing great. Has not increased water intake. She had a sinus infection and was drinking other things. Going to the bathroom ~ every 1.5 hours. Stress levels have been similar but she is managing them better. She is taking [place]hwagandah.  Precautions:  Abdominal Hysterectomy, HTN, Diabetes, asthma, Lumbar DDD,  Idiopathic Urticaria, GAD, HLD, Leukocytosis, Morbid Obesity, Depression, Spinal stenosis of lumbar region  Sexual activity/pain: Was having pain with both initial penetration and deeper thrusting. Not currently sexually active.  Location of pain: LBP (shoulder) Current pain: 0/10 (4/10) Max pain: 2/10 (5/10) Least pain: 0/10 (0/10) Nature of pain:stiffness (sharp, achy)  **0/10 pain following treatment in LB.  Patient Goals: To not feel tied to a bathroom and be able to drink adequate water and exercise.   OBJECTIVE  Changes in: Posture/Observations:  ASIS and ankles appear nearly level in supine, further investigation needed.  TODAY: hyperlordotic, PSIS appear level in standing  Range of Motion/Flexibilty:    Strength/MMT:  LE MMT:  Pelvic floor:  Abdominal:   Palpation: TTP to R supra and infraspinatus and B multifidus at T12-L1 and L5 bilaterally.   Gait Analysis:  INTERVENTIONS THIS SESSION: Manual: Performed TP release to R supra and infraspinatus and B lumbar multifidus/thoracic multifidus at T-12-L1 and L5 bilaterally to decrease spasm and pain and allow for improved balance of musculature for improved function and decreased symptoms.  Dry-needle: Performed TPDN with a .30x1mm needle and standard approach as described below to decrease spasm and pain and allow for improved balance of musculature for improved function and decreased symptoms.  Therex: Educated on and practiced low back stretch and seated chin-tucks and scapular retractions.  Total time: 45 min.  Trigger Point Dry Needling - 12/14/20 0001    Consent Given? Yes    Education Handout Provided No    Muscles Treated Upper Quadrant Supraspinatus;Infraspinatus    Muscles Treated Back/Hip Lumbar multifidi;Thoracic multifidi                  PT Short Term Goals - 11/13/20 0914      PT SHORT TERM GOAL #1   Title Patient will demonstrate  coordinated diaphragmatic breathing with pelvic tilts to demonstrate improved control of diaphragm and TA, to allow for further strengthening of core musculature and decreased pelvic floor spasm.    Baseline poor coordination of deep-core (3 finger diastasis)    Time 6    Period Weeks    Status New    Target Date 12/21/20      PT SHORT TERM GOAL #2   Title Patient will demonstrate improved sitting and standing posture to demonstrate learning and decrease stress on the pelvic floor with functional activity.    Baseline hyperlordotic/anterior pelvic tilt.    Time 6    Period Weeks    Status New    Target Date 12/21/20      PT SHORT TERM GOAL #3   Title Patient will demonstrate HEP x1 in the clinic to demonstrate understanding and proper form to allow for further improvement.    Baseline Pt. lacks knowledge of which therapeutic exercises will decrease her Sx.    Time 6    Period Weeks    Status New    Target Date 12/21/20             PT Long Term Goals - 11/13/20 0918      PT LONG TERM GOAL #1   Title Patient will describe pain no greater than 1/10 during pelvic exam/intercourse and walking/standing for ~ 1 hr. to demonstrate improved functional ability.    Baseline has LBP with worsens when on her feet for extended time, has been in a boot for ~ 5 months    Time 12    Period Weeks    Status New    Target Date 02/02/21      PT LONG TERM GOAL #2   Title Patient will report no episodes of UUI over the course of the prior two weeks to demonstrate improved functional ability.    Baseline She is having UUI daily    Time 12    Period Weeks    Status New    Target Date 02/02/21      PT LONG TERM GOAL #3   Title Pt. will improve in FOTO score by 10 points in each category to demonstrate improved function.    Baseline FOTO PFDI Prolapse: 42, Urinary Problem: 46, PFDI Urinary: 42    Time 12    Period Weeks    Status New    Target Date 02/02/21                 Plan -  12/14/20 2376    Clinical Impression Statement Pt. Responded well to all interventions today, demonstrating decreased spasm and TTP as well as understanding and correct performance of all education and exercises provided today. They will continue to benefit from skilled physical therapy to work toward remaining goals and maximize function as well as decrease likelihood of symptom increase or recurrence.    PT Next Visit Plan teach deep-core recruitment, posture in walking and standing.    PT Home Exercise Plan self c-section release, seated posture,  seated pelvic tilts, diaphragmatic breathing,posture in walking and standing, back-relax, 3-way wall stretch, progressive relaxation technique, low back stretch, scapular retractions and chin-tucks.    Consulted and Agree with Plan of Care Patient           Patient will benefit from skilled therapeutic intervention in order to improve the following deficits and impairments:     Visit Diagnosis: Sacrococcygeal disorders, not elsewhere classified  Abnormal posture  Other muscle spasm     Problem List Patient Active Problem List   Diagnosis Date Noted  . Gestational diabetes 06/02/2020  . Arthritis 10/11/2019  . DDD (degenerative disc disease), lumbar 10/11/2019  . Hyperlipidemia 10/11/2019  . Idiopathic urticaria 10/11/2019  . Recurrent major depressive disorder, in partial remission ([place]) 10/11/2019  . Spinal stenosis of lumbar region without neurogenic claudication 10/11/2019  . Type 2 diabetes mellitus without complication, with long-term current use of insulin ([place]) 10/11/2019  . Elevated lactic acid level 09/29/2017  . Leukocytosis 09/29/2017  . Diabetes ([place]) 09/28/2017  . Hypertension 09/28/2017  . Mild asthma without complication 89/38/1017  . Morbid obesity ([place]) 09/28/2017  . Thalassemia 09/28/2017  . GAD (generalized anxiety disorder) 07/25/2014  . 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

## 2020-12-14 NOTE — Patient Instructions (Addendum)
   Tuck your hips under, then keep the tuck as you lean forward so you feel a stretch through your low back. Hold for 5 deep breaths, repeat 2-3 times, 1-2 times per day.   Shoulder Retraction and Downward Rotation   Rotate the shoulder blades back and down as if you had to hold a pencil between them, holding for 1 full second each time. Repeat this _3x10_ times _1-2_ times per day.      Start with Shoulder Retraction and Downward Rotation pictures above, holding the position while pulling the chin straight back as if trying to make a "double chin".  Breathe in forward and breathe out as you pull back, repeating this _10x3__ times _1-2___ times per day.   Use just the weight of your upper arm to gently press the lower arm toward the bed. Hold for 2 min. While breathing deeply.

## 2020-12-21 ENCOUNTER — Ambulatory Visit: Payer: BC Managed Care – PPO

## 2020-12-21 ENCOUNTER — Other Ambulatory Visit: Payer: Self-pay

## 2020-12-21 DIAGNOSIS — R293 Abnormal posture: Secondary | ICD-10-CM

## 2020-12-21 DIAGNOSIS — M533 Sacrococcygeal disorders, not elsewhere classified: Secondary | ICD-10-CM

## 2020-12-21 DIAGNOSIS — M62838 Other muscle spasm: Secondary | ICD-10-CM

## 2020-12-21 NOTE — Therapy (Signed)
[place] MAIN [facility] SERVICES [address] [place], [place], [address] Phone: [phone]   Fax:  [phone]  Physical Therapy Treatment  The patient has been informed of current processes in place at Outpatient Rehab to protect patients from Covid-19 exposure including social distancing, schedule modifications, and new cleaning procedures. After discussing their particular risk with a therapist based on the patient's personal risk factors, the patient has decided to proceed with in-person therapy.  Patient Details  Name: Eileen Chaney MRN: [id number] Date of Birth: [birth year] No data recorded  Encounter Date: 12/21/2020   PT End of Session - 12/21/20 0812    Visit Number 5    Number of Visits 12    Date for PT Re-Evaluation 02/02/21    Authorization Type BCBS    Authorization Time Period 11/09/20 through 02/02/21    Authorization - Visit Number 5    Authorization - Number of Visits 10    Progress Note Due on Visit 10    PT Start Time 0733    PT Stop Time 0833    PT Time Calculation (min) 60 min    Activity Tolerance Patient tolerated treatment well;No increased pain    Behavior During Therapy [facility] for tasks assessed/performed           No past medical history on file.  Past Surgical History:  Procedure Laterality Date  . ABDOMINAL HYSTERECTOMY     uterine fibroids 2018    There were no vitals filed for this visit.  Pelvic Floor Physical Therapy Treatment Note  SCREENING  Changes in medications, allergies, or medical history?: none    SUBJECTIVE  Patient reports: She is having worse pain in her LB and shoulders as well as a lot of digestive "stuff" she has realized that she cannot do caffeine. Things felt great at the end of our last session but by the weekend it picked back up. She has inflammatory arthritis and it seems like when that is flared up that so is everything else. Her diet has changed ~ 3 weeks ago and so  she has had very little appetite. She has been eating less animal protein. Has been drinking a lot of smoothies and pea protein drinks and yogurt. She was also diagnosed with IBS/IBD.  Stools have been lose over the last week.  Precautions:  Has an inflammatory arthritis, Abdominal Hysterectomy, HTN, Diabetes, asthma, Lumbar DDD, Idiopathic Urticaria, GAD, HLD, Leukocytosis, Morbid Obesity, Depression, Spinal stenosis of lumbar region  Sexual activity/pain: Was having pain with both initial penetration and deeper thrusting. Not currently sexually active.  Location of pain: LBP (shoulder) Current pain: 6/10 (6/10) Max pain: 6/10 (6/10) Least pain: 0/10 (0/10) Nature of pain:stiffness (sharp, achy)  **0/10 pain following treatment in LB.  Patient Goals: To not feel tied to a bathroom and be able to drink adequate water and exercise.   OBJECTIVE  Changes in: Posture/Observations:  ASIS and ankles appear nearly level in supine, further investigation needed.  12-14-20: hyperlordotic, PSIS appear level in standing  TODAY:   Range of Motion/Flexibilty:  Decreased fascial mobility through entire back with greatest restriction at B lateral waist folds and midline in lumbar region.  Strength/MMT:  LE MMT:  Pelvic floor:  Abdominal:   Palpation: 12-14-20: TTP to R supra and infraspinatus and B multifidus at T12-L1 and L5 bilaterally.   TODAY: TTP to B lumbar erector spinae and multifidus at L4-5 and L serratus anterior.  Gait Analysis:  INTERVENTIONS THIS SESSION:  Manual: Performed TP release to B lumbar erector spinae and multifidus at L4-5 and L serratus anterior as well as MFR using static and dynamic cupping from ~T7 down and laterally to midline to decrease spasm and pain and allow for improved balance of musculature for improved function and decreased symptoms.  Dry-needle: Performed TPDN with a .30x121mm needle and standard approach as described below to decrease  spasm and pain and allow for improved balance of musculature for improved function and decreased symptoms.  Self-care: discussed keeping a food diary and pain diary to determine whether there is a food intolerance or other activity triggering an inflammatory response. Recommended Pt. See a functional medicine provider to determine cause of inflammation.   Therex: Given cat-cow with emphasis on pelvic tilt to maintain LB ROM.  Total time: 60 min.                       Trigger Point Dry Needling - 12/21/20 0001    Consent Given? Yes    Education Handout Provided No    Muscles Treated Back/Hip Lumbar multifidi;Erector spinae    Erector spinae Response Twitch response elicited;Palpable increased muscle length    Lumbar multifidi Response Twitch response elicited;Palpable increased muscle length                  PT Short Term Goals - 11/13/20 0914      PT SHORT TERM GOAL #1   Title Patient will demonstrate coordinated diaphragmatic breathing with pelvic tilts to demonstrate improved control of diaphragm and TA, to allow for further strengthening of core musculature and decreased pelvic floor spasm.    Baseline poor coordination of deep-core (3 finger diastasis)    Time 6    Period Weeks    Status New    Target Date 12/21/20      PT SHORT TERM GOAL #2   Title Patient will demonstrate improved sitting and standing posture to demonstrate learning and decrease stress on the pelvic floor with functional activity.    Baseline hyperlordotic/anterior pelvic tilt.    Time 6    Period Weeks    Status New    Target Date 12/21/20      PT SHORT TERM GOAL #3   Title Patient will demonstrate HEP x1 in the clinic to demonstrate understanding and proper form to allow for further improvement.    Baseline Pt. lacks knowledge of which therapeutic exercises will decrease her Sx.    Time 6    Period Weeks    Status New    Target Date 12/21/20             PT Long Term  Goals - 11/13/20 0918      PT LONG TERM GOAL #1   Title Patient will describe pain no greater than 1/10 during pelvic exam/intercourse and walking/standing for ~ 1 hr. to demonstrate improved functional ability.    Baseline has LBP with worsens when on her feet for extended time, has been in a boot for ~ 5 months    Time 12    Period Weeks    Status New    Target Date 02/02/21      PT LONG TERM GOAL #2   Title Patient will report no episodes of UUI over the course of the prior two weeks to demonstrate improved functional ability.    Baseline She is having UUI daily    Time 12    Period Weeks    Status  New    Target Date 02/02/21      PT LONG TERM GOAL #3   Title Pt. will improve in FOTO score by 10 points in each category to demonstrate improved function.    Baseline FOTO PFDI Prolapse: 42, Urinary Problem: 46, PFDI Urinary: 42    Time 12    Period Weeks    Status New    Target Date 02/02/21                 Plan - 12/21/20 1700    Clinical Impression Statement Pt. Responded well to all interventions today, demonstrating decreased spasm and TTP and increased fascial ROM, as well as understanding and correct performance of all education and exercises provided today. They will continue to benefit from skilled physical therapy to work toward remaining goals and maximize function as well as decrease likelihood of symptom increase or recurrence.    PT Next Visit Plan teach deep-core recruitment, posture in walking and standing.    PT Home Exercise Plan self c-section release, seated posture, seated pelvic tilts, diaphragmatic breathing,posture in walking and standing, back-relax, 3-way wall stretch, progressive relaxation technique, low back stretch, scapular retractions and chin-tucks.    Consulted and Agree with Plan of Care Patient           Patient will benefit from skilled therapeutic intervention in order to improve the following deficits and impairments:     Visit  Diagnosis: Sacrococcygeal disorders, not elsewhere classified  Abnormal posture  Other muscle spasm     Problem List Patient Active Problem List   Diagnosis Date Noted  . Gestational diabetes 06/02/2020  . Arthritis 10/11/2019  . DDD (degenerative disc disease), lumbar 10/11/2019  . Hyperlipidemia 10/11/2019  . Idiopathic urticaria 10/11/2019  . Recurrent major depressive disorder, in partial remission ([place]) 10/11/2019  . Spinal stenosis of lumbar region without neurogenic claudication 10/11/2019  . Type 2 diabetes mellitus without complication, with long-term current use of insulin ([place]) 10/11/2019  . Elevated lactic acid level 09/29/2017  . Leukocytosis 09/29/2017  . Diabetes ([place]) 09/28/2017  . Hypertension 09/28/2017  . Mild asthma without complication 17/49/4496  . Morbid obesity ([place]) 09/28/2017  . Thalassemia 09/28/2017  . GAD (generalized anxiety disorder) 07/25/2014  . 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 [place], [place], [address] Phone: [phone]   Fax:  [phone]  Name: Eileen Chaney MRN: [id number] Date of Birth: Aug 09, 1976

## 2020-12-21 NOTE — Patient Instructions (Signed)
Cat / Cow Flow    Exhale, press spine toward ceiling like a Halloween cat. Keeping strength in arms and abdominals, Inhale to soften spine through neutral and into cow pose. Open chest and arch back. Initiate movement between cat and cow at tailbone, one vertebrae at a time. Repeat __30__ times.

## 2020-12-28 ENCOUNTER — Other Ambulatory Visit: Payer: Self-pay

## 2020-12-28 ENCOUNTER — Ambulatory Visit: Payer: BC Managed Care – PPO

## 2020-12-28 DIAGNOSIS — M533 Sacrococcygeal disorders, not elsewhere classified: Secondary | ICD-10-CM | POA: Diagnosis not present

## 2020-12-28 DIAGNOSIS — M62838 Other muscle spasm: Secondary | ICD-10-CM

## 2020-12-28 DIAGNOSIS — R293 Abnormal posture: Secondary | ICD-10-CM

## 2020-12-28 NOTE — Therapy (Signed)
[address] MAIN [facility] SERVICES [address] [place], [place], [address] Phone: [phone]   Fax:  [phone]  Physical Therapy Treatment  The patient has been informed of current processes in place at Outpatient Rehab to protect patients from Covid-19 exposure including social distancing, schedule modifications, and new cleaning procedures. After discussing their particular risk with a therapist based on the patient's personal risk factors, the patient has decided to proceed with in-person therapy.  Patient Details  Name: Eileen Chaney MRN: [id number] Date of Birth: 1976/08/27 No data recorded  Encounter Date: 12/28/2020   PT End of Session - 12/28/20 1139    Visit Number 6    Number of Visits 12    Date for PT Re-Evaluation 02/02/21    Authorization Type BCBS    Authorization Time Period 11/09/20 through 02/02/21    Authorization - Visit Number 6    Authorization - Number of Visits 10    Progress Note Due on Visit 10    PT Start Time 0740    PT Stop Time 0840    PT Time Calculation (min) 60 min    Activity Tolerance Patient tolerated treatment well;No increased pain    Behavior During Therapy [facility] for tasks assessed/performed           No past medical history on file.  Past Surgical History:  Procedure Laterality Date  . ABDOMINAL HYSTERECTOMY     uterine fibroids 2018    There were no vitals filed for this visit.  Pelvic Floor Physical Therapy Treatment Note  SCREENING  Changes in medications, allergies, or medical history?: none    SUBJECTIVE  Patient reports: She felt pretty good after the last session but pain has increased some again over the last few days. UI is better than it was 6 months ago.  Precautions:  Has an inflammatory arthritis, Abdominal Hysterectomy, HTN, Diabetes, asthma, Lumbar DDD, Idiopathic Urticaria, GAD, HLD, Leukocytosis, Morbid Obesity, Depression, Spinal stenosis of lumbar  region  Sexual activity/pain: Was having pain with both initial penetration and deeper thrusting. Not currently sexually active.  Location of pain: LBP (shoulder) Current pain: 3/10 (3/10) Max pain: 3/10 (3/10) Least pain: 0/10 (0/10) Nature of pain:stiffness (sharp, achy)  **0/10 pain following treatment in LB but "achy" from TPDN.  Patient Goals: To not feel tied to a bathroom and be able to drink adequate water and exercise.   OBJECTIVE  Changes in: Posture/Observations:  ASIS and ankles appear nearly level in supine, further investigation needed.  12-14-20: hyperlordotic, PSIS appear level in standing  TODAY:  hyperlordotic, PSIS appear level in standing  Range of Motion/Flexibilty:  12/21/20: Decreased fascial mobility through entire back with greatest restriction at B lateral waist folds and midline in lumbar region.  TODAY: decreased fascial mobility and pain with MFR near R>L glute med. Region.  Strength/MMT:  LE MMT:  Pelvic floor:  Abdominal:   Palpation: 12-14-20: TTP to R supra and infraspinatus and B multifidus at T12-L1 and L5 bilaterally.   TODAY: TTP to B lumbar multifidus at L4-5 and B OI externally.  Gait Analysis:  INTERVENTIONS THIS SESSION: Manual: Performed TP release to B lumbar multifidus at L4-5 and B OI externally as well as MFR using static and dynamic cupping through B superior and lateral glute region to decrease spasm and pain and allow for improved balance of musculature for improved function and decreased symptoms.  Dry-needle: Performed TPDN with a .30x21mm needle and standard approach as described below to  decrease spasm and pain and allow for improved balance of musculature for improved function and decreased symptoms.  Therex: Educated on and practiced child's pose and happy-baby To maintain and improve muscle length and allow for improved balance of musculature for long-term symptom relief.  Total time: 60  min.                               PT Short Term Goals - 11/13/20 0914      PT SHORT TERM GOAL #1   Title Patient will demonstrate coordinated diaphragmatic breathing with pelvic tilts to demonstrate improved control of diaphragm and TA, to allow for further strengthening of core musculature and decreased pelvic floor spasm.    Baseline poor coordination of deep-core (3 finger diastasis)    Time 6    Period Weeks    Status New    Target Date 12/21/20      PT SHORT TERM GOAL #2   Title Patient will demonstrate improved sitting and standing posture to demonstrate learning and decrease stress on the pelvic floor with functional activity.    Baseline hyperlordotic/anterior pelvic tilt.    Time 6    Period Weeks    Status New    Target Date 12/21/20      PT SHORT TERM GOAL #3   Title Patient will demonstrate HEP x1 in the clinic to demonstrate understanding and proper form to allow for further improvement.    Baseline Pt. lacks knowledge of which therapeutic exercises will decrease her Sx.    Time 6    Period Weeks    Status New    Target Date 12/21/20             PT Long Term Goals - 11/13/20 0918      PT LONG TERM GOAL #1   Title Patient will describe pain no greater than 1/10 during pelvic exam/intercourse and walking/standing for ~ 1 hr. to demonstrate improved functional ability.    Baseline has LBP with worsens when on her feet for extended time, has been in a boot for ~ 5 months    Time 12    Period Weeks    Status New    Target Date 02/02/21      PT LONG TERM GOAL #2   Title Patient will report no episodes of UUI over the course of the prior two weeks to demonstrate improved functional ability.    Baseline She is having UUI daily    Time 12    Period Weeks    Status New    Target Date 02/02/21      PT LONG TERM GOAL #3   Title Pt. will improve in FOTO score by 10 points in each category to demonstrate improved function.    Baseline  FOTO PFDI Prolapse: 42, Urinary Problem: 46, PFDI Urinary: 42    Time 12    Period Weeks    Status New    Target Date 02/02/21                 Plan - 12/28/20 1139    Clinical Impression Statement Pt. Responded well to all interventions today, demonstrating decreased TTP and fascial restriction through the low back and posterior hips, as well as understanding and correct performance of all education and exercises provided today. They will continue to benefit from skilled physical therapy to work toward remaining goals and maximize function as well as decrease  likelihood of symptom increase or recurrence.    PT Next Visit Plan Review mini-marches and progress PRN, Assess internally, posture in walking and standing.    PT Home Exercise Plan self c-section release, seated posture, seated pelvic tilts, diaphragmatic breathing,posture in walking and standing, back-relax, 3-way wall stretch, progressive relaxation technique, low back stretch, scapular retractions and chin-tucks, mini-marches, Happy-baby/child's pose.    Consulted and Agree with Plan of Care Patient           Patient will benefit from skilled therapeutic intervention in order to improve the following deficits and impairments:     Visit Diagnosis: Sacrococcygeal disorders, not elsewhere classified  Abnormal posture  Other muscle spasm     Problem List Patient Active Problem List   Diagnosis Date Noted  . Gestational diabetes 06/02/2020  . Arthritis 10/11/2019  . DDD (degenerative disc disease), lumbar 10/11/2019  . Hyperlipidemia 10/11/2019  . Idiopathic urticaria 10/11/2019  . Recurrent major depressive disorder, in partial remission ([place]) 10/11/2019  . Spinal stenosis of lumbar region without neurogenic claudication 10/11/2019  . Type 2 diabetes mellitus without complication, with long-term current use of insulin ([place]) 10/11/2019  . Elevated lactic acid level 09/29/2017  . Leukocytosis 09/29/2017  .  Diabetes ([place]) 09/28/2017  . Hypertension 09/28/2017  . Mild asthma without complication 46/27/0350  . Morbid obesity ([place]) 09/28/2017  . Thalassemia 09/28/2017  . GAD (generalized anxiety disorder) 07/25/2014  . 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

## 2020-12-28 NOTE — Patient Instructions (Signed)
Mini-Marches    Exhale, drawing the lower tummy (TA) in toward the back bone and hold contraction while you lift one foot ~ 2 inches off the mat, then the other foot before relaxing and resetting. Try to keep your hips from rocking, using your hands to sense whether they are staying even as pictured.      Perform __10_ repetitions for _2__ sets. Do this _1-2_ times per day.  Child's Pose Pelvic Floor Lengthening    Sit in knee-chest position and reach arms forward. Separate knees for comfort. Hold position for _5__ breaths. Repeat _2-3__ times. Do _1-2__ times per day.  OR  Hold for 5 deep breaths, repeat 2-3 times, once daily.

## 2021-01-02 ENCOUNTER — Other Ambulatory Visit: Payer: Self-pay

## 2021-01-02 ENCOUNTER — Ambulatory Visit: Payer: BC Managed Care – PPO

## 2021-01-02 DIAGNOSIS — M533 Sacrococcygeal disorders, not elsewhere classified: Secondary | ICD-10-CM | POA: Insufficient documentation

## 2021-01-02 DIAGNOSIS — M62838 Other muscle spasm: Secondary | ICD-10-CM | POA: Diagnosis present

## 2021-01-02 DIAGNOSIS — R293 Abnormal posture: Secondary | ICD-10-CM

## 2021-01-02 NOTE — Therapy (Signed)
[place] MAIN [facility] SERVICES [address] [place], [place], [address] Phone: [phone]   Fax:  [phone]  Physical Therapy Treatment  The patient has been informed of current processes in place at Outpatient Rehab to protect patients from Covid-19 exposure including social distancing, schedule modifications, and new cleaning procedures. After discussing their particular risk with a therapist based on the patient's personal risk factors, the patient has decided to proceed with in-person therapy.  Patient Details  Name: Eileen Chaney MRN: [id number] Date of Birth: [birth year] No data recorded  Encounter Date: 01/02/2021    No past medical history on file.  Past Surgical History:  Procedure Laterality Date  . ABDOMINAL HYSTERECTOMY     uterine fibroids 2018    There were no vitals filed for this visit.   Pelvic Floor Physical Therapy Treatment Note  SCREENING  Changes in medications, allergies, or medical history?: none    SUBJECTIVE  Patient reports: She has been doing worse starting on ~ Saturday. She is having increased pain and tension. Has taken pain medicine so she is managing today but it has not been great. Immediately after last session felt great.  Precautions:  Has an inflammatory arthritis, Abdominal Hysterectomy, HTN, Diabetes, asthma, Lumbar DDD, Idiopathic Urticaria, GAD, HLD, Leukocytosis, Morbid Obesity, Depression, Spinal stenosis of lumbar region  Sexual activity/pain: Was having pain with both initial penetration and deeper thrusting. Not currently sexually active.  Location of pain: LBP/tailbone (shoulder) Current pain: 6/10 (4/10) Max pain: 8/10 (7/10) Least pain: 0/10 (0/10) Nature of pain:stiffness (sharp, achy)  **4/10 "a lot better, not sharp, more achy" following treatment.  Patient Goals: To not feel tied to a bathroom and be able to drink adequate water and  exercise.   OBJECTIVE  Changes in: Posture/Observations:  ASIS and ankles appear nearly level in supine, further investigation needed.  12-14-20: hyperlordotic, PSIS appear level in standing  TODAY:  hyperlordotic, PSIS appear level in standing  Range of Motion/Flexibilty:  12/21/20: Decreased fascial mobility through entire back with greatest restriction at B lateral waist folds and midline in lumbar region.  TODAY: decreased fascial mobility and pain with MFR near R glute med. Region.  Strength/MMT:  LE MMT:  Pelvic floor:  Abdominal:   Palpation: 12-14-20: TTP to R supra and infraspinatus and B multifidus at T12-L1 and L5 bilaterally.   TODAY: TTP to B lumbar multifidus at L5 and R sacral base.  Gait Analysis:  INTERVENTIONS THIS SESSION: Manual: Performed manual traction with legs in a 90/90 position  And 10 sec. On and 3 sec. Off for ~ 15 min. To decrease bulge and take pressure off of the nerve root causing pain and PFM dysfunction. Performed TP release to B lumbar multifidus at L5 and R sacral base as well as MFR using static and dynamic cupping through R superior and lateral glute region to decrease spasm and pain and allow for improved balance of musculature for improved function and decreased symptoms.  Self-care: Educated on the mechanism of pain from a herniated disk and how to maintain a neutral spine and self-traction as well as ice and anti-inflammatory as appropriate to help continue to reduce pain. Educated on how to use a tennis ball to perform self TP release to lumbar extensors and the importance of not going into flexion for a few days to prevent the bulge from worsening during the inflammatory phase.  Total time: 60 min.  PT Short Term Goals - 11/13/20 0914      PT SHORT TERM GOAL #1   Title Patient will demonstrate coordinated diaphragmatic breathing with pelvic tilts to demonstrate improved control of  diaphragm and TA, to allow for further strengthening of core musculature and decreased pelvic floor spasm.    Baseline poor coordination of deep-core (3 finger diastasis)    Time 6    Period Weeks    Status New    Target Date 12/21/20      PT SHORT TERM GOAL #2   Title Patient will demonstrate improved sitting and standing posture to demonstrate learning and decrease stress on the pelvic floor with functional activity.    Baseline hyperlordotic/anterior pelvic tilt.    Time 6    Period Weeks    Status New    Target Date 12/21/20      PT SHORT TERM GOAL #3   Title Patient will demonstrate HEP x1 in the clinic to demonstrate understanding and proper form to allow for further improvement.    Baseline Pt. lacks knowledge of which therapeutic exercises will decrease her Sx.    Time 6    Period Weeks    Status New    Target Date 12/21/20             PT Long Term Goals - 11/13/20 0918      PT LONG TERM GOAL #1   Title Patient will describe pain no greater than 1/10 during pelvic exam/intercourse and walking/standing for ~ 1 hr. to demonstrate improved functional ability.    Baseline has LBP with worsens when on her feet for extended time, has been in a boot for ~ 5 months    Time 12    Period Weeks    Status New    Target Date 02/02/21      PT LONG TERM GOAL #2   Title Patient will report no episodes of UUI over the course of the prior two weeks to demonstrate improved functional ability.    Baseline She is having UUI daily    Time 12    Period Weeks    Status New    Target Date 02/02/21      PT LONG TERM GOAL #3   Title Pt. will improve in FOTO score by 10 points in each category to demonstrate improved function.    Baseline FOTO PFDI Prolapse: 42, Urinary Problem: 46, PFDI Urinary: 42    Time 12    Period Weeks    Status New    Target Date 02/02/21                  Patient will benefit from skilled therapeutic intervention in order to improve the following  deficits and impairments:     Visit Diagnosis: No diagnosis found.     Problem List Patient Active Problem List   Diagnosis Date Noted  . Gestational diabetes 06/02/2020  . Arthritis 10/11/2019  . DDD (degenerative disc disease), lumbar 10/11/2019  . Hyperlipidemia 10/11/2019  . Idiopathic urticaria 10/11/2019  . Recurrent major depressive disorder, in partial remission ([place]) 10/11/2019  . Spinal stenosis of lumbar region without neurogenic claudication 10/11/2019  . Type 2 diabetes mellitus without complication, with long-term current use of insulin ([place]) 10/11/2019  . Elevated lactic acid level 09/29/2017  . Leukocytosis 09/29/2017  . Diabetes ([place]) 09/28/2017  . Hypertension 09/28/2017  . Mild asthma without complication 32/99/2426  . Morbid obesity ([place]) 09/28/2017  . Thalassemia 09/28/2017  .  GAD (generalized anxiety disorder) 07/25/2014  . 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

## 2021-01-04 ENCOUNTER — Ambulatory Visit: Payer: BC Managed Care – PPO

## 2021-01-11 ENCOUNTER — Ambulatory Visit: Payer: BC Managed Care – PPO

## 2021-01-11 ENCOUNTER — Other Ambulatory Visit: Payer: Self-pay

## 2021-01-11 DIAGNOSIS — M533 Sacrococcygeal disorders, not elsewhere classified: Secondary | ICD-10-CM

## 2021-01-11 DIAGNOSIS — R293 Abnormal posture: Secondary | ICD-10-CM

## 2021-01-11 DIAGNOSIS — M62838 Other muscle spasm: Secondary | ICD-10-CM

## 2021-01-11 NOTE — Therapy (Signed)
[place] MAIN [facility] SERVICES [address] [place], [place], [address] Phone: [phone]   Fax:  [phone]  Physical Therapy Treatment  The patient has been informed of current processes in place at Outpatient Rehab to protect patients from Covid-19 exposure including social distancing, schedule modifications, and new cleaning procedures. After discussing their particular risk with a therapist based on the patient's personal risk factors, the patient has decided to proceed with in-person therapy.   Patient Details  Name: Eileen Chaney MRN: [id number] Date of Birth: October 12, 1975 No data recorded  Encounter Date: 01/11/2021   PT End of Session - 01/11/21 1309    Visit Number 8    Number of Visits 12    Date for PT Re-Evaluation 02/02/21    Authorization Type BCBS    Authorization Time Period 11/09/20 through 02/02/21    Authorization - Visit Number 8    Authorization - Number of Visits 10    Progress Note Due on Visit 10    PT Start Time 0740    PT Stop Time 4481    PT Time Calculation (min) 55 min    Activity Tolerance Patient tolerated treatment well;No increased pain    Behavior During Therapy [facility] for tasks assessed/performed           No past medical history on file.  Past Surgical History:  Procedure Laterality Date  . ABDOMINAL HYSTERECTOMY     uterine fibroids 2018    There were no vitals filed for this visit.  Pelvic Floor Physical Therapy Treatment Note  SCREENING  Changes in medications, allergies, or medical history?: none    SUBJECTIVE  Patient reports: She is on a lot of pain medicine and muscle relaxers. Sitting right now she feels ok but being up seems to make it work. She got a great massage and did a float tank. She has had leakage every single day. She has been getting a lot of cramping through the groin B.   Precautions:  Has an inflammatory arthritis, Abdominal Hysterectomy, HTN, Diabetes, asthma,  Lumbar DDD, Idiopathic Urticaria, GAD, HLD, Leukocytosis, Morbid Obesity, Depression, Spinal stenosis of lumbar region  Sexual activity/pain: Was having pain with both initial penetration and deeper thrusting. Not currently sexually active.  Location of pain: LBP/tailbone (shoulder) Current pain: 0/10 (4/10) Max pain: 7/10 (5/10) Least pain: 0/10 (0/10) Nature of pain:stiffness (sharp, achy)  **no pain in the LB/hips following treatment  But some mid-back pain upon standing  Patient Goals: To not feel tied to a bathroom and be able to drink adequate water and exercise.   OBJECTIVE  Changes in: Posture/Observations:  ASIS and ankles appear nearly level in supine, further investigation needed.  12-14-20: hyperlordotic, PSIS appear level in standing  01/02/21:  hyperlordotic, PSIS appear level in standing  Range of Motion/Flexibilty:  12/21/20: Decreased fascial mobility through entire back with greatest restriction at B lateral waist folds and midline in lumbar region.  TODAY: decreased fascial mobility and pain with MFR near R glute med. Region.  Strength/MMT:  LE MMT:  Pelvic floor:  Abdominal:   Palpation: 12-14-20: TTP to R supra and infraspinatus and B multifidus at T12-L1 and L5 bilaterally.   TODAY: TTP to B Iliacus, sartorius and TFL.  Gait Analysis:  INTERVENTIONS THIS SESSION: Manual: Performed TP release and STM to B iliacus, Sartorius and TFL to decrease spasm and pain and allow for improved balance of musculature for improved function and decreased symptoms.  Self-care: Educated on the  potential signs of MCAD and given information on how to determine whether she feels that this aligns with what she feels and has noticed over the years.  Therex: Educated on prone press-ups in prone and standing to help decrease LBP from the bulging disk and on standing quad stretch to help maintain decreased tension through the TFL and sartorius and help prevent return of  spasm/pain.  Total time: 60 min.                                  PT Short Term Goals - 11/13/20 0914      PT SHORT TERM GOAL #1   Title Patient will demonstrate coordinated diaphragmatic breathing with pelvic tilts to demonstrate improved control of diaphragm and TA, to allow for further strengthening of core musculature and decreased pelvic floor spasm.    Baseline poor coordination of deep-core (3 finger diastasis)    Time 6    Period Weeks    Status New    Target Date 12/21/20      PT SHORT TERM GOAL #2   Title Patient will demonstrate improved sitting and standing posture to demonstrate learning and decrease stress on the pelvic floor with functional activity.    Baseline hyperlordotic/anterior pelvic tilt.    Time 6    Period Weeks    Status New    Target Date 12/21/20      PT SHORT TERM GOAL #3   Title Patient will demonstrate HEP x1 in the clinic to demonstrate understanding and proper form to allow for further improvement.    Baseline Pt. lacks knowledge of which therapeutic exercises will decrease her Sx.    Time 6    Period Weeks    Status New    Target Date 12/21/20             PT Long Term Goals - 11/13/20 0918      PT LONG TERM GOAL #1   Title Patient will describe pain no greater than 1/10 during pelvic exam/intercourse and walking/standing for ~ 1 hr. to demonstrate improved functional ability.    Baseline has LBP with worsens when on her feet for extended time, has been in a boot for ~ 5 months    Time 12    Period Weeks    Status New    Target Date 02/02/21      PT LONG TERM GOAL #2   Title Patient will report no episodes of UUI over the course of the prior two weeks to demonstrate improved functional ability.    Baseline She is having UUI daily    Time 12    Period Weeks    Status New    Target Date 02/02/21      PT LONG TERM GOAL #3   Title Pt. will improve in FOTO score by 10 points in each category to  demonstrate improved function.    Baseline FOTO PFDI Prolapse: 42, Urinary Problem: 46, PFDI Urinary: 42    Time 12    Period Weeks    Status New    Target Date 02/02/21                 Plan - 01/11/21 1310    Clinical Impression Statement Pt. Responded well to all interventions today, demonstrating decreased spasm, TTP, and pain as well as understanding and correct performance of all education and exercises provided today. They will continue  to benefit from skilled physical therapy to work toward remaining goals and maximize function as well as decrease likelihood of symptom increase or recurrence.    PT Next Visit Plan Review mini-marches and progress PRN, Assess internally, posture in walking and standing.    PT Home Exercise Plan self c-section release, seated posture, seated pelvic tilts, diaphragmatic breathing,posture in walking and standing, back-relax, 3-way wall stretch, progressive relaxation technique, low back stretch, scapular retractions and chin-tucks, mini-marches, Happy-baby/child's pose, standing quad stretch, Prone press ups, MCAD info.    Consulted and Agree with Plan of Care Patient           Patient will benefit from skilled therapeutic intervention in order to improve the following deficits and impairments:     Visit Diagnosis: Sacrococcygeal disorders, not elsewhere classified  Abnormal posture  Other muscle spasm     Problem List Patient Active Problem List   Diagnosis Date Noted  . Gestational diabetes 06/02/2020  . Arthritis 10/11/2019  . DDD (degenerative disc disease), lumbar 10/11/2019  . Hyperlipidemia 10/11/2019  . Idiopathic urticaria 10/11/2019  . Recurrent major depressive disorder, in partial remission ([place]) 10/11/2019  . Spinal stenosis of lumbar region without neurogenic claudication 10/11/2019  . Type 2 diabetes mellitus without complication, with long-term current use of insulin ([place]) 10/11/2019  . Elevated lactic acid level  09/29/2017  . Leukocytosis 09/29/2017  . Diabetes ([place]) 09/28/2017  . Hypertension 09/28/2017  . Mild asthma without complication 78/67/6720  . Morbid obesity ([place]) 09/28/2017  . Thalassemia 09/28/2017  . GAD (generalized anxiety disorder) 07/25/2014  . 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 [place], [place], [address] Phone: [phone]   Fax:  [phone]  Name: Eileen Chaney MRN: [id number] Date of Birth: Jan 28, 1976

## 2021-01-11 NOTE — Patient Instructions (Signed)
  Hold for ~ 5 deep breaths, repeat 2-3 times on each side. (can do this in standing if unable to relax hips/back well enough) Keep the hips relaxed and press up just to the point of discomfort 9 times the on the last on e press all the way up and exhale letting the hips drop toward the table.

## 2021-01-16 ENCOUNTER — Other Ambulatory Visit: Payer: Self-pay

## 2021-01-16 ENCOUNTER — Ambulatory Visit: Payer: BC Managed Care – PPO

## 2021-01-16 DIAGNOSIS — M62838 Other muscle spasm: Secondary | ICD-10-CM

## 2021-01-16 DIAGNOSIS — M533 Sacrococcygeal disorders, not elsewhere classified: Secondary | ICD-10-CM | POA: Diagnosis not present

## 2021-01-16 DIAGNOSIS — R293 Abnormal posture: Secondary | ICD-10-CM

## 2021-01-16 NOTE — Patient Instructions (Signed)
    Sit at the end of the foam roller or towel roll and lie back with your spine along the length of it. Bring your arms out to the side, palms up, and take _10__ belly breaths.  Next, slide arms up overhead doing a "snow angel" motion as you breathe in and down toward your hips as you breathe out. Do this for _10__ belly breaths.

## 2021-01-16 NOTE — Therapy (Signed)
[place] MAIN [facility] SERVICES [address] [place], [place], [address] Phone: [phone]   Fax:  [phone]  Physical Therapy Treatment  The patient has been informed of current processes in place at Outpatient Rehab to protect patients from Covid-19 exposure including social distancing, schedule modifications, and new cleaning procedures. After discussing their particular risk with a therapist based on the patient's personal risk factors, the patient has decided to proceed with in-person therapy.  Patient Details  Name: Eileen Chaney MRN: [id number] Date of Birth: Jul 28, 1976 No data recorded  Encounter Date: 01/16/2021   PT End of Session - 01/18/21 0735    Visit Number 9    Number of Visits 12    Date for PT Re-Evaluation 02/02/21    Authorization Type BCBS    Authorization Time Period 11/09/20 through 02/02/21    Authorization - Visit Number 9    Authorization - Number of Visits 10    Progress Note Due on Visit 10    PT Start Time 1630    PT Stop Time 1730    PT Time Calculation (min) 60 min    Activity Tolerance Patient tolerated treatment well;No increased pain    Behavior During Therapy [facility] for tasks assessed/performed           No past medical history on file.  Past Surgical History:  Procedure Laterality Date  . ABDOMINAL HYSTERECTOMY     uterine fibroids 2018    There were no vitals filed for this visit.   Pelvic Floor Physical Therapy Treatment Note  SCREENING  Changes in medications, allergies, or medical history?: none    SUBJECTIVE  Patient reports: She has days where the pain is better and days where it is worse. She can use less pain medicine than last week, occasionally can take none. Pain is mostly very low in the back on R>L and in the upper back which feels more like a burning pain Vs. Stabbing in the lower. Still not making it to the bathroom daily but still not as bad as last week.    Precautions:  Has an inflammatory arthritis, Abdominal Hysterectomy, HTN, Diabetes, asthma, Lumbar DDD, Idiopathic Urticaria, GAD, HLD, Leukocytosis, Morbid Obesity, Depression, Spinal stenosis of lumbar region  Sexual activity/pain: Was having pain with both initial penetration and deeper thrusting. Not currently sexually active.  Location of pain: LBP/tailbone (shoulder) Current pain: 4/10 (4/10) Max pain: 7/10 (4/10) Least pain: 0/10 (0/10) Nature of pain:stiffness (sharp, achy)  **some increased pain near mid-back from [facility] following treatment.  Patient Goals: To not feel tied to a bathroom and be able to drink adequate water and exercise.   OBJECTIVE  Changes in: Posture/Observations:  ASIS and ankles appear nearly level in supine, further investigation needed.  12-14-20: hyperlordotic, PSIS appear level in standing  01/02/21:  hyperlordotic, PSIS appear level in standing  Range of Motion/Flexibilty:  12/21/20: Decreased fascial mobility through entire back with greatest restriction at B lateral waist folds and midline in lumbar region.  01/11/21: decreased fascial mobility and pain with MFR near R glute med. Region.  TODAY: decreased spinal mobility and increased sensitivity with PIVM greatest at ~ T7  Strength/MMT:  LE MMT:  Pelvic floor:  Abdominal:   Palpation: 12-14-20: TTP to R supra and infraspinatus and B multifidus at T12-L1 and L5 bilaterally.   01/11/21: TTP to B Iliacus, sartorius and TFL.  TODAY: TTP to multifidus B at ~T7  Gait Analysis:  INTERVENTIONS THIS SESSION: Manual: Performed  TP release and STM to  multifidus B at ~T7 followed by grade 2-4 PA mobs along the thoracic spine to decrease spasm and pain and allow for improved balance of musculature for improved function and decreased symptoms.  Dry-needle: Performed TPDN with a .30x57mm needle and standard approach as described below to decrease spasm and pain and allow for improved balance  of musculature for improved function and decreased symptoms.  Therex: Educated on chest stretch along longitudinal foam roller/towel to gently mobilize the thoracic spine and help improve spinal alignment and To maintain and improve muscle length and allow for improved balance of musculature for long-term symptom relief.   Total time: 60 min.                             PT Short Term Goals - 11/13/20 0914      PT SHORT TERM GOAL #1   Title Patient will demonstrate coordinated diaphragmatic breathing with pelvic tilts to demonstrate improved control of diaphragm and TA, to allow for further strengthening of core musculature and decreased pelvic floor spasm.    Baseline poor coordination of deep-core (3 finger diastasis)    Time 6    Period Weeks    Status New    Target Date 12/21/20      PT SHORT TERM GOAL #2   Title Patient will demonstrate improved sitting and standing posture to demonstrate learning and decrease stress on the pelvic floor with functional activity.    Baseline hyperlordotic/anterior pelvic tilt.    Time 6    Period Weeks    Status New    Target Date 12/21/20      PT SHORT TERM GOAL #3   Title Patient will demonstrate HEP x1 in the clinic to demonstrate understanding and proper form to allow for further improvement.    Baseline Pt. lacks knowledge of which therapeutic exercises will decrease her Sx.    Time 6    Period Weeks    Status New    Target Date 12/21/20             PT Long Term Goals - 11/13/20 0918      PT LONG TERM GOAL #1   Title Patient will describe pain no greater than 1/10 during pelvic exam/intercourse and walking/standing for ~ 1 hr. to demonstrate improved functional ability.    Baseline has LBP with worsens when on her feet for extended time, has been in a boot for ~ 5 months    Time 12    Period Weeks    Status New    Target Date 02/02/21      PT LONG TERM GOAL #2   Title Patient will report no episodes  of UUI over the course of the prior two weeks to demonstrate improved functional ability.    Baseline She is having UUI daily    Time 12    Period Weeks    Status New    Target Date 02/02/21      PT LONG TERM GOAL #3   Title Pt. will improve in FOTO score by 10 points in each category to demonstrate improved function.    Baseline FOTO PFDI Prolapse: 42, Urinary Problem: 46, PFDI Urinary: 42    Time 12    Period Weeks    Status New    Target Date 02/02/21                 Plan -  01/18/21 0735    Clinical Impression Statement Pt. has had a complex response to treatment as it has shown improvement immediately with a delayed increase in pain following treatment by 1-2 days. It is likely due to increased inflammatory response from whatever is causing her urticaria. 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 She Will reqire more frequent treatment with less aggressive interventions to keep from over-stimulating the nervous system and increasing the inflammatory response through to much release of toxins with manual release. We will switch to 2x/week to allow for more frequent, less intense sessions as to not over-stimulate the sustem moving forwrd.    Personal Factors and Comorbidities Comorbidity 3+    Comorbidities Abdominal Hysterectomy, HTN, Diabetes, asthma, Lumbar DDD, Idiopathic Urticaria, GAD, HLD, Leukocytosis, Morbid Obesity, Depression, Spinal stenosis of lumbar region    Examination-Activity Limitations Toileting;Continence    Examination-Participation Restrictions Interpersonal Relationship;Occupation;Community Activity    Stability/Clinical Decision Making Evolving/Moderate complexity    Clinical Decision Making Moderate    Rehab Potential Good    PT Frequency 2x / week    PT Duration 12 weeks   12 weeks   PT Treatment/Interventions ADLs/Self Care Home Management;Biofeedback;Electrical Stimulation;Moist Heat;Ultrasound;Therapeutic  activities;Functional mobility training;Gait training;Therapeutic exercise;Neuromuscular re-education;Patient/family education;Manual techniques;Scar mobilization;Dry needling;Passive range of motion;Taping;Spinal Manipulations;Joint Manipulations    PT Next Visit Plan Review mini-marches and progress PRN, Assess internally, posture in walking and standing.    PT Home Exercise Plan self c-section release, seated posture, seated pelvic tilts, diaphragmatic breathing,posture in walking and standing, back-relax, 3-way wall stretch, progressive relaxation technique, low back stretch, scapular retractions and chin-tucks, mini-marches, Happy-baby/child's pose, standing quad stretch, Prone press ups, MCAD info, longitudinal chest stretch on foam roller.    Consulted and Agree with Plan of Care Patient           Patient will benefit from skilled therapeutic intervention in order to improve the following deficits and impairments:  Decreased activity tolerance,Increased fascial restricitons,Improper body mechanics,Pain,Obesity,Postural dysfunction,Increased muscle spasms,Decreased scar mobility,Decreased coordination  Visit Diagnosis: Sacrococcygeal disorders, not elsewhere classified  Abnormal posture  Other muscle spasm     Problem List Patient Active Problem List   Diagnosis Date Noted  . Gestational diabetes 06/02/2020  . Arthritis 10/11/2019  . DDD (degenerative disc disease), lumbar 10/11/2019  . Hyperlipidemia 10/11/2019  . Idiopathic urticaria 10/11/2019  . Recurrent major depressive disorder, in partial remission ([place]) 10/11/2019  . Spinal stenosis of lumbar region without neurogenic claudication 10/11/2019  . Type 2 diabetes mellitus without complication, with long-term current use of insulin ([place]) 10/11/2019  . Elevated lactic acid level 09/29/2017  . Leukocytosis 09/29/2017  . Diabetes ([place]) 09/28/2017  . Hypertension 09/28/2017  . Mild asthma without complication 38/93/7342  .  Morbid obesity ([place]) 09/28/2017  . Thalassemia 09/28/2017  . GAD (generalized anxiety disorder) 07/25/2014  . 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

## 2021-01-18 ENCOUNTER — Other Ambulatory Visit: Payer: Self-pay

## 2021-01-18 ENCOUNTER — Ambulatory Visit: Payer: BC Managed Care – PPO

## 2021-01-18 DIAGNOSIS — M533 Sacrococcygeal disorders, not elsewhere classified: Secondary | ICD-10-CM | POA: Diagnosis not present

## 2021-01-18 DIAGNOSIS — M62838 Other muscle spasm: Secondary | ICD-10-CM

## 2021-01-18 DIAGNOSIS — R293 Abnormal posture: Secondary | ICD-10-CM

## 2021-01-18 NOTE — Therapy (Signed)
[place] MAIN [facility] SERVICES [address] [place], [place], [address] Phone: [phone]   Fax:  [phone]  Physical Therapy Treatment  The patient has been informed of current processes in place at Outpatient Rehab to protect patients from Covid-19 exposure including social distancing, schedule modifications, and new cleaning procedures. After discussing their particular risk with a therapist based on the patient's personal risk factors, the patient has decided to proceed with in-person therapy.  Patient Details  Name: Eileen Chaney MRN: [id number] Date of Birth: September 19, 1976 No data recorded  Encounter Date: 01/18/2021   PT End of Session - 01/18/21 1227    Visit Number 10    Number of Visits 34    Date for PT Re-Evaluation 04/12/21    Authorization Type BCBS    Authorization Time Period 01/16/21 through 04/12/21    Authorization - Visit Number 10    Authorization - Number of Visits 10    Progress Note Due on Visit 10    PT Start Time 1030    PT Stop Time 1130    PT Time Calculation (min) 60 min    Activity Tolerance Patient tolerated treatment well;No increased pain    Behavior During Therapy [facility] for tasks assessed/performed           No past medical history on file.  Past Surgical History:  Procedure Laterality Date  . ABDOMINAL HYSTERECTOMY     uterine fibroids 2018    There were no vitals filed for this visit.  Pelvic Floor Physical Therapy Treatment Note  SCREENING  Changes in medications, allergies, or medical history?: none    SUBJECTIVE  Patient reports: She has had elevated pain that feels like it is everywhere from her shoulders to her knees. She got a massage on Wednesday and she could barely tolerate any pressure.   Precautions:  Has an inflammatory arthritis, Abdominal Hysterectomy, HTN, Diabetes, asthma, Lumbar DDD, Idiopathic Urticaria, GAD, HLD, Leukocytosis, Morbid Obesity, Depression, Spinal  stenosis of lumbar region  Sexual activity/pain: Was having pain with both initial penetration and deeper thrusting. Not currently sexually active.  Location of pain: LBP/tailbone (shoulder) Current pain: 7/10 (6/10) Max pain: 7/10 (6/10) Least pain: 0/10 (0/10) Nature of pain:stiffness (sharp, achy)  **Following treatment Pt. Reports "much less" pain but is feeling nausea and dizziness.  Patient Goals: To not feel tied to a bathroom and be able to drink adequate water and exercise.   OBJECTIVE  Changes in: Posture/Observations:  ASIS and ankles appear nearly level in supine, further investigation needed.  12-14-20: hyperlordotic, PSIS appear level in standing  01/02/21:  hyperlordotic, PSIS appear level in standing  Range of Motion/Flexibilty:  12/21/20: Decreased fascial mobility through entire back with greatest restriction at B lateral waist folds and midline in lumbar region.  01/11/21: decreased fascial mobility and pain with MFR near R glute med. Region.  01/16/21: decreased spinal mobility and increased sensitivity with PIVM greatest at ~ T7  TODAY: decreased mobility in L to R dicection through the cervical spine. Decreased PIVM in PA direction near the T/L junction.  Strength/MMT:  LE MMT:  Pelvic floor:  Abdominal:   Palpation: 12-14-20: TTP to R supra and infraspinatus and B multifidus at T12-L1 and L5 bilaterally.   01/11/21: TTP to B Iliacus, sartorius and TFL.  01/16/21: TTP to multifidus B at ~T7  TODAY: TTP to L>R cervical extensors and sub-occipitals.   Gait Analysis:  INTERVENTIONS THIS SESSION: Manual: Performed TP release  L>R cervical  extensors and sub-occipitals followed by grade 2-3 PA mobs surrounding the T/L junction and L to R mobs at C2-4 transverse process along the thoracic spine to decrease spasm and pain and allow for improved balance of musculature for improved function and decreased symptoms.  Therex: Educated on chest stretch  along longitudinal foam roller/towel to gently mobilize the thoracic spine and help improve spinal alignment and To maintain and improve muscle length and allow for improved balance of musculature for long-term symptom relief.   Total time: 60 min.                               PT Short Term Goals - 11/13/20 0914      PT SHORT TERM GOAL #1   Title Patient will demonstrate coordinated diaphragmatic breathing with pelvic tilts to demonstrate improved control of diaphragm and TA, to allow for further strengthening of core musculature and decreased pelvic floor spasm.    Baseline poor coordination of deep-core (3 finger diastasis)    Time 6    Period Weeks    Status New    Target Date 12/21/20      PT SHORT TERM GOAL #2   Title Patient will demonstrate improved sitting and standing posture to demonstrate learning and decrease stress on the pelvic floor with functional activity.    Baseline hyperlordotic/anterior pelvic tilt.    Time 6    Period Weeks    Status New    Target Date 12/21/20      PT SHORT TERM GOAL #3   Title Patient will demonstrate HEP x1 in the clinic to demonstrate understanding and proper form to allow for further improvement.    Baseline Pt. lacks knowledge of which therapeutic exercises will decrease her Sx.    Time 6    Period Weeks    Status New    Target Date 12/21/20             PT Long Term Goals - 11/13/20 0918      PT LONG TERM GOAL #1   Title Patient will describe pain no greater than 1/10 during pelvic exam/intercourse and walking/standing for ~ 1 hr. to demonstrate improved functional ability.    Baseline has LBP with worsens when on her feet for extended time, has been in a boot for ~ 5 months    Time 12    Period Weeks    Status New    Target Date 02/02/21      PT LONG TERM GOAL #2   Title Patient will report no episodes of UUI over the course of the prior two weeks to demonstrate improved functional ability.     Baseline She is having UUI daily    Time 12    Period Weeks    Status New    Target Date 02/02/21      PT LONG TERM GOAL #3   Title Pt. will improve in FOTO score by 10 points in each category to demonstrate improved function.    Baseline FOTO PFDI Prolapse: 42, Urinary Problem: 46, PFDI Urinary: 42    Time 12    Period Weeks    Status New    Target Date 02/02/21                 Plan - 01/18/21 1229    Clinical Impression Statement Pt. demonstrated significant reduction in pain from tpday's treatment but demonstrated a sympathetic response that  made her feel nausea and dizziness following treatment. Following rest in a dark room, diaphragmatic breathing, and consuming a mint she demonstrated decreased nausea and dizziness and was able to leave. Today's treatment was designed to be as low-impact as possible but stimulated the vagal nerve and likely triggered her inflammatory response further but she is expected to feel better as she continues to calm the nervous system through the discussed methods of breathing, avoiding triggers such as bright light or noise, and surround herself with "safety" through music and other calming enviroonmental factors.    PT Next Visit Plan Review mini-marches and progress PRN, Assess internally, posture in walking and standing.    PT Home Exercise Plan self c-section release, seated posture, seated pelvic tilts, diaphragmatic breathing,posture in walking and standing, back-relax, 3-way wall stretch, progressive relaxation technique, low back stretch, scapular retractions and chin-tucks, mini-marches, Happy-baby/child's pose, standing quad stretch, Prone press ups, MCAD info, longitudinal chest stretch on foam roller.    Consulted and Agree with Plan of Care Patient           Patient will benefit from skilled therapeutic intervention in order to improve the following deficits and impairments:     Visit Diagnosis: Sacrococcygeal disorders, not  elsewhere classified  Abnormal posture  Other muscle spasm     Problem List Patient Active Problem List   Diagnosis Date Noted  . Gestational diabetes 06/02/2020  . Arthritis 10/11/2019  . DDD (degenerative disc disease), lumbar 10/11/2019  . Hyperlipidemia 10/11/2019  . Idiopathic urticaria 10/11/2019  . Recurrent major depressive disorder, in partial remission ([place]) 10/11/2019  . Spinal stenosis of lumbar region without neurogenic claudication 10/11/2019  . Type 2 diabetes mellitus without complication, with long-term current use of insulin ([place]) 10/11/2019  . Elevated lactic acid level 09/29/2017  . Leukocytosis 09/29/2017  . Diabetes ([place]) 09/28/2017  . Hypertension 09/28/2017  . Mild asthma without complication 18/56/3149  . Morbid obesity ([place]) 09/28/2017  . Thalassemia 09/28/2017  . GAD (generalized anxiety disorder) 07/25/2014  . 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

## 2021-01-19 ENCOUNTER — Other Ambulatory Visit: Payer: Self-pay

## 2021-01-19 ENCOUNTER — Encounter: Payer: Self-pay | Admitting: Dietician

## 2021-01-19 ENCOUNTER — Encounter: Payer: BC Managed Care – PPO | Attending: Infectious Diseases | Admitting: Dietician

## 2021-01-19 VITALS — Ht 68.0 in | Wt 267.7 lb

## 2021-01-19 DIAGNOSIS — E119 Type 2 diabetes mellitus without complications: Secondary | ICD-10-CM | POA: Diagnosis not present

## 2021-01-19 DIAGNOSIS — Z794 Long term (current) use of insulin: Secondary | ICD-10-CM

## 2021-01-19 DIAGNOSIS — Z6841 Body Mass Index (BMI) 40.0 and over, adult: Secondary | ICD-10-CM | POA: Diagnosis not present

## 2021-01-19 NOTE — Patient Instructions (Addendum)
   Reduce some fat intake by limiting amount of creamy/ cheese sauces, gravies.  Shift to drinking more water and reducing sugar sweetened drinks.   Consider limiting potentially irritating high-fodmap foods and adopting Mediterranean style eating pattern.

## 2021-01-19 NOTE — Progress Notes (Signed)
Medical Nutrition Therapy: Visit start time: 1000  end time: 1100  Assessment:  Diagnosis: Type 2 DM, obesity Past medical history: HTN, IBS/ GI distress Psychosocial issues/ stress concerns: history of depression controlled with medication; high stress level and feels she is not dealing well with stress  Preferred learning method:  . Auditory   Current weight: 267.7lbs Height: 5'8" Medications, supplements: reconciled list in medical record  Progress and evaluation:   Patient reports her diabetes diagnosis was about 2 years ago; recent HbA1C was 7.4% recent HBGM results in normal range  Reports frequent GI symptoms of nausea, loose stools; unable to narrow down specific food/ cause. 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   Patient reports having Lactose intolerance, able to tolerate yogurt and cheese. Some intolerance to raw veg at times. Reactions to foods like crackers, cereal ie refined grains better when eating protein with the food.   Does not like most fruits, but does eat vegetables   Has had candidiasis issues for multiple years and limits refined grains/ yeast  Her goal is weight loss; she gained about 10lbs while wearing boot after ankle injury last fall, followed by surgery.  Physical activity: stretching 6x a week, gradually increasing activity as tolerated after ankle surgery  Dietary Intake:  Usual eating pattern includes 3 meals and 1-2 snacks per day. Dining out frequency: 8 meals per week.  Breakfast: protein shake, does not like breakfast Snack:  Lunch: about 1pm -- varies from full meal (2x a week) chicken/ fish + salad, to protein shake and yogurt, maybe fruit; protein bar cheese sticks; hummus Snack:  Supper: feels like less healthy choices-- some takeout food (tired) ie pizza; salad; tacos; if preparing a meal at home (usu daughter) -- pasta/ potatoes + chicken/  salmon + veg; often has dinner meetings with meals similar to lunch; 4/21 grilled chicken + 2 boiled eggs + vegetable drink Snack: recently weaned off gummy bears; often something unhealthy, stress related eating Beverages: water, some sweet tea, lemonade, juice  Nutrition Care Education: Topics covered:  Basic nutrition: basic food groups, appropriate nutrient balance, appropriate meal and snack schedule, general nutrition guidelines    Weight control: importance of low sugar and low fat choices; appropriate portions; provided guidance for balanced meals Diabetes: appropriate meal and snack schedule, appropriate carb intake and balance, healthy carb choices, role of fiber, protein, fat; Mediterranean eating pattern for BG control, heart health, anti-inflammatory benefit, and weight control. Other: fodmap diet for IBS/ GI symptoms; low fiber/ fat intake to control GI symptoms and promote GI healing   Nutritional Diagnosis:  [place]-2.2 Altered nutrition-related laboratory As related to Type 2 diabetes.  As evidenced by elevated HbA1C. [place]-1.4 Altered GI function As related to epioses of nausea and loose stools.  As evidenced by patient reported symptoms. [place]-3.3 Overweight/obesity As related to history of excess calories, inadequate physical activity, stress.  As evidenced by patient with current BMI of 40.6.  Intervention:  . Instruction and discussion as noted above. . Patient has been making positive diet changes gradually and is motivated to continue. . Established goals for additional change, with direction from patient.  Education Materials given:  . General diet guidelines for Diabetes . Plate Planner with food lists, sample meal pattern . Fodmap handout  . Get Healthy with Mediterranean Style Eating . Visit summary with goals/ instructions   Learner/ who was taught:  . Patient  Level of understanding: Marland Kitchen Verbalizes/ demonstrates competency  Demonstrated degree of understanding  via:   Teach back Learning barriers: . None  Willingness to learn/ readiness for change: . Eager, change in progress   Monitoring and Evaluation:  Dietary intake, exercise, BG control, GI symptoms, and body weight      follow up: 03/02/21 at 11:30am

## 2021-01-23 ENCOUNTER — Ambulatory Visit: Payer: BC Managed Care – PPO

## 2021-01-23 ENCOUNTER — Other Ambulatory Visit: Payer: Self-pay

## 2021-01-23 DIAGNOSIS — R293 Abnormal posture: Secondary | ICD-10-CM

## 2021-01-23 DIAGNOSIS — M62838 Other muscle spasm: Secondary | ICD-10-CM

## 2021-01-23 DIAGNOSIS — M533 Sacrococcygeal disorders, not elsewhere classified: Secondary | ICD-10-CM | POA: Diagnosis not present

## 2021-01-23 NOTE — Therapy (Signed)
[place] MAIN [facility] SERVICES [address] [place], [place], [address] Phone: [phone]   Fax:  [phone]  Physical Therapy Treatment  The patient has been informed of current processes in place at Outpatient Rehab to protect patients from Covid-19 exposure including social distancing, schedule modifications, and new cleaning procedures. After discussing their particular risk with a therapist based on the patient's personal risk factors, the patient has decided to proceed with in-person therapy.  Patient Details  Name: Eileen Chaney MRN: [id number] Date of Birth: [birth year] No data recorded  Encounter Date: 01/23/2021   PT End of Session - 01/25/21 1223    Visit Number 11    Number of Visits 34    Date for PT Re-Evaluation 04/12/21    Authorization Type BCBS    Authorization Time Period 01/16/21 through 04/12/21    Authorization - Visit Number 1    Authorization - Number of Visits 24    Progress Note Due on Visit 20    PT Start Time 1030    PT Stop Time 1130    PT Time Calculation (min) 60 min    Activity Tolerance Patient tolerated treatment well;No increased pain    Behavior During Therapy [facility] for tasks assessed/performed           No past medical history on file.  Past Surgical History:  Procedure Laterality Date  . ABDOMINAL HYSTERECTOMY     uterine fibroids 2018    There were no vitals filed for this visit.  Pelvic Floor Physical Therapy Treatment Note  SCREENING  Changes in medications, allergies, or medical history?: none    SUBJECTIVE  Patient reports: She is not having as much nausea but it lasted through Sunday. She is back to the baseline pain that was already elevated from where it was a couple months ago. 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 Today she has had one cup of water and a protein shake and she has not had a lot. The pain from the neck to knees did return but was gone  until at least mid-day on Friday.  Precautions:  Has an inflammatory arthritis, Abdominal Hysterectomy, HTN, Diabetes, asthma, Lumbar DDD, Idiopathic Urticaria, GAD, HLD, Leukocytosis, Morbid Obesity, Depression, Spinal stenosis of lumbar region  Sexual activity/pain: Was having pain with both initial penetration and deeper thrusting. Not currently sexually active.  Location of pain: LBP/tailbone (shoulder) Current pain: 7/10 (6/10) Max pain: 7/10 (6/10) Least pain: 0/10 (0/10) Nature of pain:stiffness (sharp, achy)  ** Pt. Reports less sensitivity to touch through the mid/upper back following treatment.  Patient Goals: To not feel tied to a bathroom and be able to drink adequate water and exercise.   OBJECTIVE  Changes in: Posture/Observations:  ASIS and ankles appear nearly level in supine, further investigation needed.  12-14-20: hyperlordotic, PSIS appear level in standing  01/02/21:  hyperlordotic, PSIS appear level in standing  Range of Motion/Flexibilty:  12/21/20: Decreased fascial mobility through entire back with greatest restriction at B lateral waist folds and midline in lumbar region.  01/11/21: decreased fascial mobility and pain with MFR near R glute med. Region.  01/16/21: decreased spinal mobility and increased sensitivity with PIVM greatest at ~ T7  01/18/21: decreased mobility in L to R dicection through the cervical spine. Decreased PIVM in PA direction near the T/L junction.  Strength/MMT:  LE MMT:  Pelvic floor:  Abdominal:   Palpation: 12-14-20: TTP to R supra and infraspinatus and B multifidus at T12-L1  and L5 bilaterally.   01/11/21: TTP to B Iliacus, sartorius and TFL.  01/16/21: TTP to multifidus B at ~T7  01/18/21: TTP to L>R cervical extensors and sub-occipitals.   TODAY: highly sensitive to T8-L2 region with poor tolerance to medium pressure near paraspinals and multifidus.   **following treatment Pt. Able to tolerate medium pressure  with far less sensitivity.  Gait Analysis:  INTERVENTIONS THIS SESSION: Manual: Performed gentle TP release to B paraspinals and MFR using silicone cupping near T-L junction to help decrease sensitivity without causing another flare to allow for decreased spasm and pain.   Self-care: Reviewed MCAD tool and discussed how she can begin to affect some change in her body using a graded approach and finding what her triggers are while she continues to work with her medical team to find the medical cause of her pain if it is still present. Discussed potential to use Heart math or Transcranial TENS as possible anxiety treatments that do not involve medication.  Total time: 60 min.                                PT Short Term Goals - 01/18/21 2043      PT SHORT TERM GOAL #1   Title Patient will demonstrate coordinated diaphragmatic breathing with pelvic tilts to demonstrate improved control of diaphragm and TA, to allow for further strengthening of core musculature and decreased pelvic floor spasm.    Baseline poor coordination of deep-core (3 finger diastasis)    Time 6    Period Weeks    Status On-going    Target Date 12/21/20      PT SHORT TERM GOAL #2   Title Patient will demonstrate improved sitting and standing posture to demonstrate learning and decrease stress on the pelvic floor with functional activity.    Baseline hyperlordotic/anterior pelvic tilt.    Time 6    Period Weeks    Status On-going    Target Date 12/21/20      PT SHORT TERM GOAL #3   Title Patient will demonstrate HEP x1 in the clinic to demonstrate understanding and proper form to allow for further improvement.    Baseline Pt. lacks knowledge of which therapeutic exercises will decrease her Sx.    Time 6    Period Weeks    Status Achieved    Target Date 12/21/20             PT Long Term Goals - 01/18/21 2045      PT LONG TERM GOAL #1   Title Patient will describe pain no  greater than 1/10 during pelvic exam/intercourse and walking/standing for ~ 1 hr. to demonstrate improved functional ability.    Baseline has LBP with worsens when on her feet for extended time, has been in a boot for ~ 5 months    Time 12    Period Weeks    Status On-going    Target Date 04/12/21      PT LONG TERM GOAL #2   Title Patient will report no episodes of UUI over the course of the prior two weeks to demonstrate improved functional ability.    Baseline She is having UUI daily    Time 12    Period Weeks    Status On-going    Target Date 04/12/21      PT LONG TERM GOAL #3   Title Pt. will improve in FOTO score  by 10 points in each category to demonstrate improved function.    Baseline FOTO PFDI Prolapse: 42, Urinary Problem: 46, PFDI Urinary: 42    Time 12    Period Weeks    Status On-going    Target Date 04/12/21                 Plan - 01/25/21 1226    Clinical Impression Statement Pt. Responded well to all interventions today, demonstrating improved sensitivity along the mid-back, as well as understanding and correct performance of all education and exercises provided today. They will continue to benefit from skilled physical therapy to work toward remaining goals and maximize function as well as decrease likelihood of symptom increase or recurrence.    PT Next Visit Plan Review mini-marches and progress PRN, release diaphragm and fascia surrounding hips, abdomen, andback. Assess internally, posture in walking and standing.    PT Home Exercise Plan self c-section release, seated posture, seated pelvic tilts, diaphragmatic breathing,posture in walking and standing, back-relax, 3-way wall stretch, progressive relaxation technique, low back stretch, scapular retractions and chin-tucks, mini-marches, Happy-baby/child's pose, standing quad stretch, Prone press ups, MCAD info, longitudinal chest stretch on foam roller.    Consulted and Agree with Plan of Care Patient            Patient will benefit from skilled therapeutic intervention in order to improve the following deficits and impairments:     Visit Diagnosis: Sacrococcygeal disorders, not elsewhere classified  Abnormal posture  Other muscle spasm     Problem List Patient Active Problem List   Diagnosis Date Noted  . Gestational diabetes 06/02/2020  . Arthritis 10/11/2019  . DDD (degenerative disc disease), lumbar 10/11/2019  . Hyperlipidemia 10/11/2019  . Idiopathic urticaria 10/11/2019  . Recurrent major depressive disorder, in partial remission ([place]) 10/11/2019  . Spinal stenosis of lumbar region without neurogenic claudication 10/11/2019  . Type 2 diabetes mellitus without complication, with long-term current use of insulin ([place]) 10/11/2019  . Elevated lactic acid level 09/29/2017  . Leukocytosis 09/29/2017  . Diabetes ([place]) 09/28/2017  . Hypertension 09/28/2017  . Mild asthma without complication 11/94/1740  . Morbid obesity ([place]) 09/28/2017  . Thalassemia 09/28/2017  . GAD (generalized anxiety disorder) 07/25/2014  . 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 [place], [place], [address] Phone: [phone]   Fax:  [phone]  Name: TIKESHA MORT MRN: [id number] Date of Birth: 09-Jan-1976

## 2021-01-25 ENCOUNTER — Ambulatory Visit: Payer: BC Managed Care – PPO

## 2021-01-25 DIAGNOSIS — M533 Sacrococcygeal disorders, not elsewhere classified: Secondary | ICD-10-CM

## 2021-01-25 DIAGNOSIS — M62838 Other muscle spasm: Secondary | ICD-10-CM

## 2021-01-25 DIAGNOSIS — R293 Abnormal posture: Secondary | ICD-10-CM

## 2021-01-25 NOTE — Therapy (Signed)
[place] MAIN [facility] SERVICES [address] [place], [place], [address] Phone: [phone]   Fax:  [phone]  Physical Therapy Treatment  The patient has been informed of current processes in place at Outpatient Rehab to protect patients from Covid-19 exposure including social distancing, schedule modifications, and new cleaning procedures. After discussing their particular risk with a therapist based on the patient's personal risk factors, the patient has decided to proceed with in-person therapy.   Patient Details  Name: Eileen Chaney MRN: [id number] Date of Birth: 1976-08-29 No data recorded  Encounter Date: 01/25/2021   PT End of Session - 01/25/21 1238    Visit Number 12    Number of Visits 34    Date for PT Re-Evaluation 04/12/21    Authorization Type BCBS    Authorization Time Period 01/16/21 through 04/12/21    Authorization - Visit Number 2    Authorization - Number of Visits 24    Progress Note Due on Visit 20    PT Start Time 1030    PT Stop Time 1130    PT Time Calculation (min) 60 min    Activity Tolerance Patient tolerated treatment well;No increased pain    Behavior During Therapy [facility] for tasks assessed/performed           No past medical history on file.  Past Surgical History:  Procedure Laterality Date  . ABDOMINAL HYSTERECTOMY     uterine fibroids 2018    There were no vitals filed for this visit.   Pelvic Floor Physical Therapy Treatment Note  SCREENING  Changes in medications, allergies, or medical history?: none    SUBJECTIVE  Patient reports: She is still taking Ibuprofen, has not taken any yet today. She is feeling a lot better than she was, not having pain at rest but does have pain in both the shoulder and back with motion. She decided to cancel her appointment with the rheumatologist until she can see functional medicine next week.   Precautions:  Has an inflammatory arthritis, Abdominal  Hysterectomy, HTN, Diabetes, asthma, Lumbar DDD, Idiopathic Urticaria, GAD, HLD, Leukocytosis, Morbid Obesity, Depression, Spinal stenosis of lumbar region  Sexual activity/pain: Was having pain with both initial penetration and deeper thrusting. Not currently sexually active.  Location of pain: LBP/tailbone (shoulder) Current pain: 0/10 (0/10) [4/10 with motion in either] Max pain: 4/10 (4/10) Least pain: 0/10 (0/10) Nature of pain:stiffness (sharp, achy)  **No increased nausea or pain at end of session.  Patient Goals: To not feel tied to a bathroom and be able to drink adequate water and exercise.   OBJECTIVE  Changes in: Posture/Observations:  ASIS and ankles appear nearly level in supine, further investigation needed.  12-14-20: hyperlordotic, PSIS appear level in standing  01/02/21:  hyperlordotic, PSIS appear level in standing  Range of Motion/Flexibilty:  12/21/20: Decreased fascial mobility through entire back with greatest restriction at B lateral waist folds and midline in lumbar region.  01/11/21: decreased fascial mobility and pain with MFR near R glute med. Region.  01/16/21: decreased spinal mobility and increased sensitivity with PIVM greatest at ~ T7  01/18/21: decreased mobility in L to R dicection through the cervical spine. Decreased PIVM in PA direction near the T/L junction.  TODAY: decreased fascial mobility along B lateral and anterior thighs and through R>L abdomen.  Strength/MMT:  LE MMT:  Pelvic floor:  Abdominal:   Palpation: 12-14-20: TTP to R supra and infraspinatus and B multifidus at T12-L1 and L5 bilaterally.  01/11/21: TTP to B Iliacus, sartorius and TFL.  01/16/21: TTP to multifidus B at ~T7  01/18/21: TTP to L>R cervical extensors and sub-occipitals.   01/23/21: highly sensitive to T8-L2 region with poor tolerance to medium pressure near paraspinals and multifidus.  **following treatment Pt. Able to tolerate medium pressure with far  less sensitivity.  TODAY: TTP to R diaphragm and superficial abdominal fascia/muscles.  Gait Analysis:  INTERVENTIONS THIS SESSION: Manual: Attempted to perform TP release to R diaphragm but tissue was too reactive causing nausea. Able to perform gentle MFR with open palm drawing fascia medially along inferior aspect of R ribcage gently with tolerable increase in discomfort/nausea that eventually faded. Performed static cupping along B anterolateral thighs to help decrease anterior pelvic tilt and decrease tension in the whole fascial system to allow for decreased pain and sensitivity.  Self-care: educated on potential for self cupping treatment and on how the whole fascial system can affect her pain and dysfunction.    Total time: 60 min.                              PT Short Term Goals - 01/18/21 2043      PT SHORT TERM GOAL #1   Title Patient will demonstrate coordinated diaphragmatic breathing with pelvic tilts to demonstrate improved control of diaphragm and TA, to allow for further strengthening of core musculature and decreased pelvic floor spasm.    Baseline poor coordination of deep-core (3 finger diastasis)    Time 6    Period Weeks    Status On-going    Target Date 12/21/20      PT SHORT TERM GOAL #2   Title Patient will demonstrate improved sitting and standing posture to demonstrate learning and decrease stress on the pelvic floor with functional activity.    Baseline hyperlordotic/anterior pelvic tilt.    Time 6    Period Weeks    Status On-going    Target Date 12/21/20      PT SHORT TERM GOAL #3   Title Patient will demonstrate HEP x1 in the clinic to demonstrate understanding and proper form to allow for further improvement.    Baseline Pt. lacks knowledge of which therapeutic exercises will decrease her Sx.    Time 6    Period Weeks    Status Achieved    Target Date 12/21/20             PT Long Term Goals - 01/18/21 2045       PT LONG TERM GOAL #1   Title Patient will describe pain no greater than 1/10 during pelvic exam/intercourse and walking/standing for ~ 1 hr. to demonstrate improved functional ability.    Baseline has LBP with worsens when on her feet for extended time, has been in a boot for ~ 5 months    Time 12    Period Weeks    Status On-going    Target Date 04/12/21      PT LONG TERM GOAL #2   Title Patient will report no episodes of UUI over the course of the prior two weeks to demonstrate improved functional ability.    Baseline She is having UUI daily    Time 12    Period Weeks    Status On-going    Target Date 04/12/21      PT LONG TERM GOAL #3   Title Pt. will improve in FOTO score by 10 points in  each category to demonstrate improved function.    Baseline FOTO PFDI Prolapse: 42, Urinary Problem: 46, PFDI Urinary: 42    Time 12    Period Weeks    Status On-going    Target Date 04/12/21                 Plan - 01/25/21 1238    Clinical Impression Statement Pt. Responded well to all interventions today, demonstrating improved tolerance to interventions and decreased fascial restriction, as well as understanding and correct performance of all education and exercises provided today. They will continue to benefit from skilled physical therapy to work toward remaining goals and maximize function as well as decrease likelihood of symptom increase or recurrence.    PT Next Visit Plan Review mini-marches and progress PRN, continue to gently release diaphragm and fascia surrounding hips, abdomen, and back. Assess internally, posture in walking and standing.    PT Home Exercise Plan self c-section release, seated posture, seated pelvic tilts, diaphragmatic breathing,posture in walking and standing, back-relax, 3-way wall stretch, progressive relaxation technique, low back stretch, scapular retractions and chin-tucks, mini-marches, Happy-baby/child's pose, standing quad stretch, Prone press ups,  MCAD info, longitudinal chest stretch on foam roller, self fascial release.    Consulted and Agree with Plan of Care Patient           Patient will benefit from skilled therapeutic intervention in order to improve the following deficits and impairments:     Visit Diagnosis: Sacrococcygeal disorders, not elsewhere classified  Abnormal posture  Other muscle spasm     Problem List Patient Active Problem List   Diagnosis Date Noted  . Gestational diabetes 06/02/2020  . Arthritis 10/11/2019  . DDD (degenerative disc disease), lumbar 10/11/2019  . Hyperlipidemia 10/11/2019  . Idiopathic urticaria 10/11/2019  . Recurrent major depressive disorder, in partial remission ([place]) 10/11/2019  . Spinal stenosis of lumbar region without neurogenic claudication 10/11/2019  . Type 2 diabetes mellitus without complication, with long-term current use of insulin ([place]) 10/11/2019  . Elevated lactic acid level 09/29/2017  . Leukocytosis 09/29/2017  . Diabetes ([place]) 09/28/2017  . Hypertension 09/28/2017  . Mild asthma without complication 16/60/6301  . Morbid obesity ([place]) 09/28/2017  . Thalassemia 09/28/2017  . GAD (generalized anxiety disorder) 07/25/2014  . 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 [place], [place], [address] Phone: [phone]   Fax:  [phone]  Name: HEPHZIBAH STREHLE MRN: [id number] Date of Birth: Jan 12, 1976

## 2021-01-30 ENCOUNTER — Other Ambulatory Visit: Payer: Self-pay

## 2021-01-30 ENCOUNTER — Ambulatory Visit: Payer: BC Managed Care – PPO

## 2021-01-30 DIAGNOSIS — M533 Sacrococcygeal disorders, not elsewhere classified: Secondary | ICD-10-CM | POA: Diagnosis not present

## 2021-01-30 DIAGNOSIS — R293 Abnormal posture: Secondary | ICD-10-CM | POA: Insufficient documentation

## 2021-01-30 DIAGNOSIS — M62838 Other muscle spasm: Secondary | ICD-10-CM | POA: Diagnosis present
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
## 2021-02-01 ENCOUNTER — Ambulatory Visit: Payer: BC Managed Care – PPO

## 2021-02-01 ENCOUNTER — Other Ambulatory Visit: Payer: Self-pay

## 2021-02-01 DIAGNOSIS — M533 Sacrococcygeal disorders, not elsewhere classified: Secondary | ICD-10-CM | POA: Diagnosis not present

## 2021-02-01 DIAGNOSIS — R293 Abnormal posture: Secondary | ICD-10-CM

## 2021-02-01 DIAGNOSIS — M62838 Other muscle spasm: Secondary | ICD-10-CM

## 2021-02-01 NOTE — Therapy (Signed)
[place] MAIN [facility] SERVICES [address] [place], [place], [address] Phone: [phone]   Fax:  [phone]  Physical Therapy Treatment  The patient has been informed of current processes in place at Outpatient Rehab to protect patients from Covid-19 exposure including social distancing, schedule modifications, and new cleaning procedures. After discussing their particular risk with a therapist based on the patient's personal risk factors, the patient has decided to proceed with in-person therapy.  Patient Details  Name: Eileen Chaney MRN: [id number] Date of Birth: 07-22-1976 No data recorded  Encounter Date: 02/01/2021   PT End of Session - 02/01/21 0733    Visit Number 14    Number of Visits 34    Date for PT Re-Evaluation 04/12/21    Authorization Type BCBS    Authorization Time Period 01/16/21 through 04/12/21    Authorization - Visit Number 4    Authorization - Number of Visits 24    Progress Note Due on Visit 20    PT Start Time 0734    Activity Tolerance Patient tolerated treatment well;No increased pain    Behavior During Therapy [facility] for tasks assessed/performed           No past medical history on file.  Past Surgical History:  Procedure Laterality Date  . ABDOMINAL HYSTERECTOMY     uterine fibroids 2018    There were no vitals filed for this visit.  Pelvic Floor Physical Therapy Treatment Note  SCREENING  Changes in medications, allergies, or medical history?: none    SUBJECTIVE  Patient reports: Wilburn Mylar was difficult but not terrible. She was sore globally but definitely more in the hip. Her pain was highest at night and when she woke up yesterday. The ribs are ~ 4/10 today. The spot under the bra strap is still hurting. She also had some stomach pain and loose stools after last treatment even though she had not eaten anything. She was very tired and went to bed early.   Precautions:  Has an inflammatory  arthritis, Abdominal Hysterectomy, HTN, Diabetes, asthma, Lumbar DDD, Idiopathic Urticaria, GAD, HLD, Leukocytosis, Morbid Obesity, Depression, Spinal stenosis of lumbar region  Sexual activity/pain: Was having pain with both initial penetration and deeper thrusting. Not currently sexually active.  Location of pain: LBP/tailbone (shoulder/torso) Current pain: 3/10 (0/10) [2/10 with motion in either] Max pain: 5/10 (6/10) Least pain: 0/10 (0/10) Nature of pain:stiffness (sharp, achy)  **No pain in the ribs following treatment.  Patient Goals: To not feel tied to a bathroom and be able to drink adequate water and exercise.   OBJECTIVE  Changes in: Posture/Observations:  ASIS and ankles appear nearly level in supine, further investigation needed.  12-14-20: hyperlordotic, PSIS appear level in standing  01/02/21:  hyperlordotic, PSIS appear level in standing  Range of Motion/Flexibilty:  12/21/20: Decreased fascial mobility through entire back with greatest restriction at B lateral waist folds and midline in lumbar region.  01/11/21: decreased fascial mobility and pain with MFR near R glute med. Region.  01/16/21: decreased spinal mobility and increased sensitivity with PIVM greatest at ~ T7  01/18/21: decreased mobility in L to R dicection through the cervical spine. Decreased PIVM in PA direction near the T/L junction.  01/25/21: decreased fascial mobility along B lateral and anterior thighs and through R>L abdomen.  TODAY: decreased fascial mobility through the R lateral abdomen along T10-12 nerve distribution.  Strength/MMT:  LE MMT:  Pelvic floor:  Abdominal:   Palpation: 12-14-20: TTP to R  supra and infraspinatus and B multifidus at T12-L1 and L5 bilaterally.   01/11/21: TTP to B Iliacus, sartorius and TFL.  01/16/21: TTP to multifidus B at ~T7  01/18/21: TTP to L>R cervical extensors and sub-occipitals.   01/23/21: highly sensitive to T8-L2 region with poor tolerance  to medium pressure near paraspinals and multifidus.  **following treatment Pt. Able to tolerate medium pressure with far less sensitivity.  01/25/21: TTP to R diaphragm and superficial abdominal fascia/muscles.  TODAY: TTP to multifidus and erector spinae near T1-12 with radiation of Sx along the lateral ribcage.  Gait Analysis:  INTERVENTIONS THIS SESSION: Manual: Performed TP release  to multifidus and erector spinae near T1-12 as well as MFR using static and skin-rolling manual techniques to the T10-12 nerve distribution along the lateral ribcage to decrease spasm and pain and allow for improved balance of musculature for improved function and decreased symptoms.  Self-care: Discussed Pt's appointment with functional medicine and the plan as well as how we will be as aggressive as is appropriate without causing extreme flares that would be anti-productive in her healing.    Total time: 60 min.                              PT Short Term Goals - 01/18/21 2043      PT SHORT TERM GOAL #1   Title Patient will demonstrate coordinated diaphragmatic breathing with pelvic tilts to demonstrate improved control of diaphragm and TA, to allow for further strengthening of core musculature and decreased pelvic floor spasm.    Baseline poor coordination of deep-core (3 finger diastasis)    Time 6    Period Weeks    Status On-going    Target Date 12/21/20      PT SHORT TERM GOAL #2   Title Patient will demonstrate improved sitting and standing posture to demonstrate learning and decrease stress on the pelvic floor with functional activity.    Baseline hyperlordotic/anterior pelvic tilt.    Time 6    Period Weeks    Status On-going    Target Date 12/21/20      PT SHORT TERM GOAL #3   Title Patient will demonstrate HEP x1 in the clinic to demonstrate understanding and proper form to allow for further improvement.    Baseline Pt. lacks knowledge of which therapeutic  exercises will decrease her Sx.    Time 6    Period Weeks    Status Achieved    Target Date 12/21/20             PT Long Term Goals - 01/18/21 2045      PT LONG TERM GOAL #1   Title Patient will describe pain no greater than 1/10 during pelvic exam/intercourse and walking/standing for ~ 1 hr. to demonstrate improved functional ability.    Baseline has LBP with worsens when on her feet for extended time, has been in a boot for ~ 5 months    Time 12    Period Weeks    Status On-going    Target Date 04/12/21      PT LONG TERM GOAL #2   Title Patient will report no episodes of UUI over the course of the prior two weeks to demonstrate improved functional ability.    Baseline She is having UUI daily    Time 12    Period Weeks    Status On-going    Target Date 04/12/21  PT LONG TERM GOAL #3   Title Pt. will improve in FOTO score by 10 points in each category to demonstrate improved function.    Baseline FOTO PFDI Prolapse: 42, Urinary Problem: 46, PFDI Urinary: 42    Time 12    Period Weeks    Status On-going    Target Date 04/12/21                 Plan - 02/01/21 1221    Clinical Impression Statement Pt. Responded well to all interventions today, demonstrating improved fascial mobility, decreased spasm, and resolution of rib-cage pain, as well as understanding and correct performance of all education and exercises provided today. They will continue to benefit from skilled physical therapy to work toward remaining goals and maximize function as well as decrease likelihood of symptom increase or recurrence.    PT Next Visit Plan Review mini-marches and progress PRN, continue to gently release diaphragm and fascia surrounding hips, abdomen, and back. Assess internally, posture in walking and standing.    PT Home Exercise Plan self c-section release, seated posture, seated pelvic tilts, diaphragmatic breathing,posture in walking and standing, back-relax, 3-way wall stretch,  progressive relaxation technique, low back stretch, scapular retractions and chin-tucks, mini-marches, Happy-baby/child's pose, standing quad stretch, Prone press ups, MCAD info, longitudinal chest stretch on foam roller, self fascial release.    Consulted and Agree with Plan of Care Patient           Patient will benefit from skilled therapeutic intervention in order to improve the following deficits and impairments:     Visit Diagnosis: Sacrococcygeal disorders, not elsewhere classified  Abnormal posture  Other muscle spasm     Problem List Patient Active Problem List   Diagnosis Date Noted  . Gestational diabetes 06/02/2020  . Arthritis 10/11/2019  . DDD (degenerative disc disease), lumbar 10/11/2019  . Hyperlipidemia 10/11/2019  . Idiopathic urticaria 10/11/2019  . Recurrent major depressive disorder, in partial remission ([place]) 10/11/2019  . Spinal stenosis of lumbar region without neurogenic claudication 10/11/2019  . Type 2 diabetes mellitus without complication, with long-term current use of insulin ([place]) 10/11/2019  . Elevated lactic acid level 09/29/2017  . Leukocytosis 09/29/2017  . Diabetes ([place]) 09/28/2017  . Hypertension 09/28/2017  . Mild asthma without complication 16/55/3748  . Morbid obesity ([place]) 09/28/2017  . Thalassemia 09/28/2017  . GAD (generalized anxiety disorder) 07/25/2014  . 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

## 2021-02-05 ENCOUNTER — Other Ambulatory Visit: Payer: Self-pay

## 2021-02-05 ENCOUNTER — Ambulatory Visit: Payer: BC Managed Care – PPO

## 2021-02-05 DIAGNOSIS — M533 Sacrococcygeal disorders, not elsewhere classified: Secondary | ICD-10-CM

## 2021-02-05 DIAGNOSIS — M62838 Other muscle spasm: Secondary | ICD-10-CM

## 2021-02-05 DIAGNOSIS — R293 Abnormal posture: Secondary | ICD-10-CM

## 2021-02-05 NOTE — Therapy (Signed)
[place] MAIN [facility] SERVICES [address] [place], [place], [address] Phone: [phone]   Fax:  [phone]  Physical Therapy Treatment  The patient has been informed of current processes in place at Outpatient Rehab to protect patients from Covid-19 exposure including social distancing, schedule modifications, and new cleaning procedures. After discussing their particular risk with a therapist based on the patient's personal risk factors, the patient has decided to proceed with in-person therapy.  Patient Details  Name: Eileen Chaney MRN: [id number] Date of Birth: 11-07-1975 No data recorded  Encounter Date: 02/05/2021   PT End of Session - 02/05/21 1631    Visit Number 15    Number of Visits 34    Date for PT Re-Evaluation 04/12/21    Authorization Type BCBS    Authorization Time Period 01/16/21 through 04/12/21    Authorization - Visit Number 5    Authorization - Number of Visits 24    Progress Note Due on Visit 20    PT Start Time 2706    PT Stop Time 1630    PT Time Calculation (min) 60 min    Activity Tolerance Patient tolerated treatment well;No increased pain    Behavior During Therapy [facility] for tasks assessed/performed           No past medical history on file.  Past Surgical History:  Procedure Laterality Date  . ABDOMINAL HYSTERECTOMY     uterine fibroids 2018    There were no vitals filed for this visit.  Pelvic Floor Physical Therapy Treatment Note  SCREENING  Changes in medications, allergies, or medical history?: none    SUBJECTIVE  Patient reports: The ribs felt better until about Saturday. Everything hurts today. The things that have been the worst are the hips/torso and LB. Does feel like stress was increased on Friday afternoon. Feeling like she is getting better at recognizing   Precautions:  Has an inflammatory arthritis, Abdominal Hysterectomy, HTN, Diabetes, asthma, Lumbar DDD, Idiopathic  Urticaria, GAD, HLD, Leukocytosis, Morbid Obesity, Depression, Spinal stenosis of lumbar region  Sexual activity/pain: Was having pain with both initial penetration and deeper thrusting. Not currently sexually active.  Location of pain: LBP/hips (shoulder/torso) Current pain: 3/10 (6/10) [2/10 with motion in either] Max pain: 7/10 (7/10) Least pain: 0/10 (0/10) Nature of pain:stiffness (sharp, achy)  **2/10 through torso/LB following treatment but some increased nausea noted.  Patient Goals: To not feel tied to a bathroom and be able to drink adequate water and exercise.   OBJECTIVE  Changes in: Posture/Observations:  ASIS and ankles appear nearly level in supine, further investigation needed.  12-14-20: hyperlordotic, PSIS appear level in standing  01/02/21:  hyperlordotic, PSIS appear level in standing  Range of Motion/Flexibilty:  12/21/20: Decreased fascial mobility through entire back with greatest restriction at B lateral waist folds and midline in lumbar region.  01/11/21: decreased fascial mobility and pain with MFR near R glute med. Region.  01/16/21: decreased spinal mobility and increased sensitivity with PIVM greatest at ~ T7  01/18/21: decreased mobility in L to R dicection through the cervical spine. Decreased PIVM in PA direction near the T/L junction.  01/25/21: decreased fascial mobility along B lateral and anterior thighs and through R>L abdomen.  02/01/21: decreased fascial mobility through the R lateral abdomen along T10-12 nerve distribution.  TODAY: decreased fascial mobility through lower half of back with greatest at R iliac crest posteriorly.  Strength/MMT:  LE MMT:  Pelvic floor:  Abdominal:   Palpation:  12-14-20: TTP to R supra and infraspinatus and B multifidus at T12-L1 and L5 bilaterally.   01/11/21: TTP to B Iliacus, sartorius and TFL.  01/16/21: TTP to multifidus B at ~T7  01/18/21: TTP to L>R cervical extensors and sub-occipitals.    01/23/21: highly sensitive to T8-L2 region with poor tolerance to medium pressure near paraspinals and multifidus.  **following treatment Pt. Able to tolerate medium pressure with far less sensitivity.  01/25/21: TTP to R diaphragm and superficial abdominal fascia/muscles.  02/01/21: TTP to multifidus and erector spinae near T1-12 with radiation of Sx along the lateral ribcage.  TODAY: TTP to multifidus and erector spinae near T1-12 on R>L and R>L QL  Gait Analysis:  INTERVENTIONS THIS SESSION: Manual: Performed TP release  to multifidus and erector spinae near T1-12 on R and R>L QL as well as MFR using static and dynamic cupping from ~ T7 down to S1 with greatest restriction at R posterior iliac crest to decrease spasm and pain and allow for improved balance of musculature for improved function and decreased symptoms.  Total time: 60 min.                              PT Short Term Goals - 01/18/21 2043      PT SHORT TERM GOAL #1   Title Patient will demonstrate coordinated diaphragmatic breathing with pelvic tilts to demonstrate improved control of diaphragm and TA, to allow for further strengthening of core musculature and decreased pelvic floor spasm.    Baseline poor coordination of deep-core (3 finger diastasis)    Time 6    Period Weeks    Status On-going    Target Date 12/21/20      PT SHORT TERM GOAL #2   Title Patient will demonstrate improved sitting and standing posture to demonstrate learning and decrease stress on the pelvic floor with functional activity.    Baseline hyperlordotic/anterior pelvic tilt.    Time 6    Period Weeks    Status On-going    Target Date 12/21/20      PT SHORT TERM GOAL #3   Title Patient will demonstrate HEP x1 in the clinic to demonstrate understanding and proper form to allow for further improvement.    Baseline Pt. lacks knowledge of which therapeutic exercises will decrease her Sx.    Time 6    Period Weeks     Status Achieved    Target Date 12/21/20             PT Long Term Goals - 01/18/21 2045      PT LONG TERM GOAL #1   Title Patient will describe pain no greater than 1/10 during pelvic exam/intercourse and walking/standing for ~ 1 hr. to demonstrate improved functional ability.    Baseline has LBP with worsens when on her feet for extended time, has been in a boot for ~ 5 months    Time 12    Period Weeks    Status On-going    Target Date 04/12/21      PT LONG TERM GOAL #2   Title Patient will report no episodes of UUI over the course of the prior two weeks to demonstrate improved functional ability.    Baseline She is having UUI daily    Time 12    Period Weeks    Status On-going    Target Date 04/12/21      PT LONG TERM GOAL #  3   Title Pt. will improve in FOTO score by 10 points in each category to demonstrate improved function.    Baseline FOTO PFDI Prolapse: 42, Urinary Problem: 46, PFDI Urinary: 42    Time 12    Period Weeks    Status On-going    Target Date 04/12/21                 Plan - 02/06/21 0726    Clinical Impression Statement Pt. Responded well to all interventions today, demonstrating improved fascial mobility, decreased spasm and pain reduction from 6/10 to 2/10, as well as understanding and correct performance of all education and exercises provided today. They will continue to benefit from skilled physical therapy to work toward remaining goals and maximize function as well as decrease likelihood of symptom increase or recurrence.    PT Next Visit Plan Review mini-marches and progress PRN, continue to gently release diaphragm and fascia surrounding hips, abdomen, and back. Assess internally, posture in walking and standing.    PT Home Exercise Plan self c-section release, seated posture, seated pelvic tilts, diaphragmatic breathing,posture in walking and standing, back-relax, 3-way wall stretch, progressive relaxation technique, low back stretch,  scapular retractions and chin-tucks, mini-marches, Happy-baby/child's pose, standing quad stretch, Prone press ups, MCAD info, longitudinal chest stretch on foam roller, self fascial release.    Consulted and Agree with Plan of Care Patient           Patient will benefit from skilled therapeutic intervention in order to improve the following deficits and impairments:     Visit Diagnosis: Sacrococcygeal disorders, not elsewhere classified  Abnormal posture  Other muscle spasm     Problem List Patient Active Problem List   Diagnosis Date Noted  . Gestational diabetes 06/02/2020  . Arthritis 10/11/2019  . DDD (degenerative disc disease), lumbar 10/11/2019  . Hyperlipidemia 10/11/2019  . Idiopathic urticaria 10/11/2019  . Recurrent major depressive disorder, in partial remission ([place]) 10/11/2019  . Spinal stenosis of lumbar region without neurogenic claudication 10/11/2019  . Type 2 diabetes mellitus without complication, with long-term current use of insulin ([place]) 10/11/2019  . Elevated lactic acid level 09/29/2017  . Leukocytosis 09/29/2017  . Diabetes ([place]) 09/28/2017  . Hypertension 09/28/2017  . Mild asthma without complication 26/83/4196  . Morbid obesity ([place]) 09/28/2017  . Thalassemia 09/28/2017  . GAD (generalized anxiety disorder) 07/25/2014  . 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 [place], [place], [address] Phone: [phone]   Fax:  [phone]  Name: YASLENE LINDAMOOD MRN: [id number] Date of Birth: 1976-08-01

## 2021-02-06 ENCOUNTER — Ambulatory Visit: Payer: BC Managed Care – PPO

## 2021-02-06 DIAGNOSIS — M62838 Other muscle spasm: Secondary | ICD-10-CM

## 2021-02-06 DIAGNOSIS — R293 Abnormal posture: Secondary | ICD-10-CM

## 2021-02-06 DIAGNOSIS — M533 Sacrococcygeal disorders, not elsewhere classified: Secondary | ICD-10-CM

## 2021-02-06 NOTE — Therapy (Signed)
[place] MAIN [facility] SERVICES [address] [place], [place], [address] Phone: [phone]   Fax:  [phone]  Physical Therapy Treatment  The patient has been informed of current processes in place at Outpatient Rehab to protect patients from Covid-19 exposure including social distancing, schedule modifications, and new cleaning procedures. After discussing their particular risk with a therapist based on the patient's personal risk factors, the patient has decided to proceed with in-person therapy.  Patient Details  Name: Eileen Chaney MRN: [id number] Date of Birth: [birth year] No data recorded  Encounter Date: 02/06/2021   PT End of Session - 02/13/21 1113    Visit Number 16    Number of Visits 34    Date for PT Re-Evaluation 04/12/21    Authorization Type BCBS    Authorization Time Period 01/16/21 through 04/12/21    Authorization - Visit Number 6    Authorization - Number of Visits 24    Progress Note Due on Visit 20    PT Start Time 1630    PT Stop Time 1730    PT Time Calculation (min) 60 min    Activity Tolerance Patient tolerated treatment well;No increased pain    Behavior During Therapy [facility] for tasks assessed/performed           No past medical history on file.  Past Surgical History:  Procedure Laterality Date  . ABDOMINAL HYSTERECTOMY     uterine fibroids 2018    There were no vitals filed for this visit.   Pelvic Floor Physical Therapy Treatment Note  SCREENING  Changes in medications, allergies, or medical history?: none    SUBJECTIVE  Patient reports: Her hip/ribs are feeling a lot better but her neck and tailbone are hurting.   Precautions:  Has an inflammatory arthritis, Abdominal Hysterectomy, HTN, Diabetes, asthma, Lumbar DDD, Idiopathic Urticaria, GAD, HLD, Leukocytosis, Morbid Obesity, Depression, Spinal stenosis of lumbar region  Sexual activity/pain: Was having pain with both initial  penetration and deeper thrusting. Not currently sexually active.  Location of pain: LBP/hips (shoulder/torso) Current pain: 3/10 (0/10) [3/10 with motion in either] Max pain: 7/10 (3/10) Least pain: 0/10 (0/10) Nature of pain:stiffness (sharp, achy)  **2/10 through torso/LB following treatment but some increased nausea noted.  Patient Goals: To not feel tied to a bathroom and be able to drink adequate water and exercise.   OBJECTIVE  Changes in: Posture/Observations:  ASIS and ankles appear nearly level in supine, further investigation needed.  12-14-20: hyperlordotic, PSIS appear level in standing  01/02/21:  hyperlordotic, PSIS appear level in standing  Range of Motion/Flexibilty:  12/21/20: Decreased fascial mobility through entire back with greatest restriction at B lateral waist folds and midline in lumbar region.  01/11/21: decreased fascial mobility and pain with MFR near R glute med. Region.  01/16/21: decreased spinal mobility and increased sensitivity with PIVM greatest at ~ T7  01/18/21: decreased mobility in L to R dicection through the cervical spine. Decreased PIVM in PA direction near the T/L junction.  01/25/21: decreased fascial mobility along B lateral and anterior thighs and through R>L abdomen.  02/01/21: decreased fascial mobility through the R lateral abdomen along T10-12 nerve distribution.  02/05/21: decreased fascial mobility through lower half of back with greatest at R iliac crest posteriorly.  Strength/MMT:  LE MMT:  Pelvic floor:  Abdominal:   Palpation: 12-14-20: TTP to R supra and infraspinatus and B multifidus at T12-L1 and L5 bilaterally.   01/11/21: TTP to B Iliacus, sartorius and  TFL.  01/16/21: TTP to multifidus B at ~T7  01/18/21: TTP to L>R cervical extensors and sub-occipitals.   01/23/21: highly sensitive to T8-L2 region with poor tolerance to medium pressure near paraspinals and multifidus.  **following treatment Pt. Able to tolerate  medium pressure with far less sensitivity.  01/25/21: TTP to R diaphragm and superficial abdominal fascia/muscles.  02/01/21: TTP to multifidus and erector spinae near T1-12 with radiation of Sx along the lateral ribcage.  02/05/21: TTP to multifidus and erector spinae near T1-12 on R>L and R>L QL  TODAY: TTP to B upper trapezius and sacral multifidus.  Gait Analysis:  INTERVENTIONS THIS SESSION: Manual: Performed TP release  to B upper Trapezius and sacral multifidus to decrease spasm and pain and allow for improved balance of musculature for improved function and decreased symptoms.  Self-care: Educated on the power of breakin gthe pain loop and using heat/ice/tens etc. rather than being "tough" to help decrease neural sensitization. Educated on lumbar support for flight/car ride.  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   Total time: 60 min.                             PT Short Term Goals - 01/18/21 2043      PT SHORT TERM GOAL #1   Title Patient will demonstrate coordinated diaphragmatic breathing with pelvic tilts to demonstrate improved control of diaphragm and TA, to allow for further strengthening of core musculature and decreased pelvic floor spasm.    Baseline poor coordination of deep-core (3 finger diastasis)    Time 6    Period Weeks    Status On-going    Target Date 12/21/20      PT SHORT TERM GOAL #2   Title Patient will demonstrate improved sitting and standing posture to demonstrate learning and decrease stress on the pelvic floor with functional activity.    Baseline hyperlordotic/anterior pelvic tilt.    Time 6    Period Weeks    Status On-going    Target Date 12/21/20      PT SHORT TERM GOAL #3   Title Patient will demonstrate HEP x1 in the clinic to demonstrate understanding and proper form  to allow for further improvement.    Baseline Pt. lacks knowledge of which therapeutic exercises will decrease her Sx.    Time 6    Period Weeks    Status Achieved    Target Date 12/21/20             PT Long Term Goals - 01/18/21 2045      PT LONG TERM GOAL #1   Title Patient will describe pain no greater than 1/10 during pelvic exam/intercourse and walking/standing for ~ 1 hr. to demonstrate improved functional ability.    Baseline has LBP with worsens when on her feet for extended time, has been in a boot for ~ 5 months    Time 12    Period Weeks    Status On-going    Target Date 04/12/21      PT LONG TERM GOAL #2   Title Patient will report no episodes of UUI over the course of the prior two weeks to demonstrate improved functional ability.    Baseline She is having UUI daily    Time 12    Period Weeks    Status  On-going    Target Date 04/12/21      PT LONG TERM GOAL #3   Title Pt. will improve in FOTO score by 10 points in each category to demonstrate improved function.    Baseline FOTO PFDI Prolapse: 42, Urinary Problem: 46, PFDI Urinary: 42    Time 12    Period Weeks    Status On-going    Target Date 04/12/21                 Plan - 02/13/21 1114    Clinical Impression Statement Pt. Responded well to all educational interventions today, with some increased nausea with manual release especially near the sub-occipitals. Nausea was improved when positioned in side-lying. She will continue to benefit from skilled physical therapy to work toward remaining goals and maximize function as well as decrease likelihood of symptom increase or recurrence.    PT Next Visit Plan Review mini-marches and progress PRN, continue to gently release diaphragm and fascia surrounding hips, abdomen, and back. Assess internally, posture in walking and standing.    PT Home Exercise Plan self c-section release, seated posture, seated pelvic tilts, diaphragmatic breathing,posture in walking  and standing, back-relax, 3-way wall stretch, progressive relaxation technique, low back stretch, scapular retractions and chin-tucks, mini-marches, Happy-baby/child's pose, standing quad stretch, Prone press ups, MCAD info, longitudinal chest stretch on foam roller, self fascial release.    Consulted and Agree with Plan of Care Patient           Patient will benefit from skilled therapeutic intervention in order to improve the following deficits and impairments:     Visit Diagnosis: Sacrococcygeal disorders, not elsewhere classified  Abnormal posture  Other muscle spasm     Problem List Patient Active Problem List   Diagnosis Date Noted  . Gestational diabetes 06/02/2020  . Arthritis 10/11/2019  . DDD (degenerative disc disease), lumbar 10/11/2019  . Hyperlipidemia 10/11/2019  . Idiopathic urticaria 10/11/2019  . Recurrent major depressive disorder, in partial remission ([place]) 10/11/2019  . Spinal stenosis of lumbar region without neurogenic claudication 10/11/2019  . Type 2 diabetes mellitus without complication, with long-term current use of insulin ([place]) 10/11/2019  . Elevated lactic acid level 09/29/2017  . Leukocytosis 09/29/2017  . Diabetes ([place]) 09/28/2017  . Hypertension 09/28/2017  . Mild asthma without complication 93/57/0177  . Morbid obesity ([place]) 09/28/2017  . Thalassemia 09/28/2017  . GAD (generalized anxiety disorder) 07/25/2014  . 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

## 2021-02-13 ENCOUNTER — Encounter: Payer: BC Managed Care – PPO | Admitting: Physical Therapy

## 2021-02-19 ENCOUNTER — Encounter: Payer: BC Managed Care – PPO | Admitting: Physical Therapy

## 2021-02-22 ENCOUNTER — Ambulatory Visit: Payer: BC Managed Care – PPO

## 2021-02-22 ENCOUNTER — Other Ambulatory Visit: Payer: Self-pay

## 2021-02-22 DIAGNOSIS — M62838 Other muscle spasm: Secondary | ICD-10-CM

## 2021-02-22 DIAGNOSIS — M533 Sacrococcygeal disorders, not elsewhere classified: Secondary | ICD-10-CM

## 2021-02-22 DIAGNOSIS — R293 Abnormal posture: Secondary | ICD-10-CM

## 2021-02-22 NOTE — Therapy (Signed)
[place] MAIN [facility] SERVICES [address] [place], [place], [address] Phone: [phone]   Fax:  [phone]  Physical Therapy Treatment  The patient has been informed of current processes in place at Outpatient Rehab to protect patients from Covid-19 exposure including social distancing, schedule modifications, and new cleaning procedures. After discussing their particular risk with a therapist based on the patient's personal risk factors, the patient has decided to proceed with in-person therapy.   Patient Details  Name: Eileen Chaney MRN: [id number] Date of Birth: [birth year] No data recorded  Encounter Date: 02/22/2021   PT End of Session - 02/22/21 1200    Visit Number 17    Number of Visits 34    Date for PT Re-Evaluation 04/12/21    Authorization Type BCBS    Authorization Time Period 01/16/21 through 04/12/21    Authorization - Visit Number 7    Authorization - Number of Visits 24    Progress Note Due on Visit 20    PT Start Time 0730    PT Stop Time 0830    PT Time Calculation (min) 60 min    Activity Tolerance Patient tolerated treatment well;No increased pain    Behavior During Therapy [facility] for tasks assessed/performed           No past medical history on file.  Past Surgical History:  Procedure Laterality Date  . ABDOMINAL HYSTERECTOMY     uterine fibroids 2018    There were no vitals filed for this visit.    Pelvic Floor Physical Therapy Treatment Note  SCREENING  Changes in medications, allergies, or medical history?: none    SUBJECTIVE  Patient reports: Her whole torso and the LB/hip area hurts today. She had a great trip to [facility] but had to be on prednisone because her jaw locked up the week before she went so she was on Prednisone the whole week. She is feeling now almost like her pain is worse than it was before the prednisone. She had had an allergic reaction to a band that they was around her  tooth. She had had a similar procedure the week before without the allergic reaction. She has been doing the scar release, stretches and mini-marches mostly.   Precautions:  Has an inflammatory arthritis, Abdominal Hysterectomy, HTN, Diabetes, asthma, Lumbar DDD, Idiopathic Urticaria, GAD, HLD, Leukocytosis, Morbid Obesity, Depression, Spinal stenosis of lumbar region  Sexual activity/pain: Was having pain with both initial penetration and deeper thrusting. Not currently sexually active.  Location of pain: LBP/hips (shoulder/torso) Current pain: 0/10 (0/10) [5/10 with motion in either] Max pain: 6/10 (3/10) Least pain: 0/10 (0/10) Nature of pain:stiffness (sharp, achy)  **3/10 through ribs/torso following treatment but some increased nausea noted. No pain in tailbone following treatment.  Patient Goals: To not feel tied to a bathroom and be able to drink adequate water and exercise.   OBJECTIVE  Changes in: Posture/Observations:  ASIS and ankles appear nearly level in supine, further investigation needed.  12-14-20: hyperlordotic, PSIS appear level in standing  01/02/21:  hyperlordotic, PSIS appear level in standing  02/22/21: L PSIS high, LLE long in supine, short in sitting  Range of Motion/Flexibilty:  12/21/20: Decreased fascial mobility through entire back with greatest restriction at B lateral waist folds and midline in lumbar region.  01/11/21: decreased fascial mobility and pain with MFR near R glute med. Region.  01/16/21: decreased spinal mobility and increased sensitivity with PIVM greatest at ~ T7  01/18/21: decreased mobility  in L to R dicection through the cervical spine. Decreased PIVM in PA direction near the T/L junction.  01/25/21: decreased fascial mobility along B lateral and anterior thighs and through R>L abdomen.  02/01/21: decreased fascial mobility through the R lateral abdomen along T10-12 nerve distribution.  02/05/21: decreased fascial mobility through  lower half of back with greatest at R iliac crest posteriorly.  Strength/MMT:  LE MMT:  Pelvic floor:  Abdominal:   Palpation: 12-14-20: TTP to R supra and infraspinatus and B multifidus at T12-L1 and L5 bilaterally.   01/11/21: TTP to B Iliacus, sartorius and TFL.  01/16/21: TTP to multifidus B at ~T7  01/18/21: TTP to L>R cervical extensors and sub-occipitals.   01/23/21: highly sensitive to T8-L2 region with poor tolerance to medium pressure near paraspinals and multifidus.  **following treatment Pt. Able to tolerate medium pressure with far less sensitivity.  01/25/21: TTP to R diaphragm and superficial abdominal fascia/muscles.  02/01/21: TTP to multifidus and erector spinae near T1-12 with radiation of Sx along the lateral ribcage.  02/05/21: TTP to multifidus and erector spinae near T1-12 on R>L and R>L QL  TODAY: TTP to B upper trapezius and sacral multifidus.  Gait Analysis:  INTERVENTIONS THIS SESSION: Manual: Performed TP release  to B upper Trapezius and sacral multifidus to decrease spasm and pain and allow for improved balance of musculature for improved function and decreased symptoms.  Self-care: Educated on the power of breakin gthe pain loop and using heat/ice/tens etc. rather than being "tough" to help decrease neural sensitization. Educated on lumbar support for flight/car ride.  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   Total time: 60 min.                               PT Short Term Goals - 01/18/21 2043      PT SHORT TERM GOAL #1   Title Patient will demonstrate coordinated diaphragmatic breathing with pelvic tilts to demonstrate improved control of diaphragm and TA, to allow for further strengthening of core musculature and decreased pelvic floor spasm.    Baseline poor coordination of  deep-core (3 finger diastasis)    Time 6    Period Weeks    Status On-going    Target Date 12/21/20      PT SHORT TERM GOAL #2   Title Patient will demonstrate improved sitting and standing posture to demonstrate learning and decrease stress on the pelvic floor with functional activity.    Baseline hyperlordotic/anterior pelvic tilt.    Time 6    Period Weeks    Status On-going    Target Date 12/21/20      PT SHORT TERM GOAL #3   Title Patient will demonstrate HEP x1 in the clinic to demonstrate understanding and proper form to allow for further improvement.    Baseline Pt. lacks knowledge of which therapeutic exercises will decrease her Sx.    Time 6    Period Weeks    Status Achieved    Target Date 12/21/20             PT Long Term Goals - 01/18/21 2045      PT LONG TERM GOAL #1   Title Patient will describe pain no greater than 1/10 during pelvic exam/intercourse and walking/standing for ~ 1 hr. to demonstrate improved  functional ability.    Baseline has LBP with worsens when on her feet for extended time, has been in a boot for ~ 5 months    Time 12    Period Weeks    Status On-going    Target Date 04/12/21      PT LONG TERM GOAL #2   Title Patient will report no episodes of UUI over the course of the prior two weeks to demonstrate improved functional ability.    Baseline She is having UUI daily    Time 12    Period Weeks    Status On-going    Target Date 04/12/21      PT LONG TERM GOAL #3   Title Pt. will improve in FOTO score by 10 points in each category to demonstrate improved function.    Baseline FOTO PFDI Prolapse: 42, Urinary Problem: 46, PFDI Urinary: 42    Time 12    Period Weeks    Status On-going    Target Date 04/12/21                 Plan - 02/22/21 1201    Clinical Impression Statement Pt. Responded well to all interventions today, demonstrating improved pelvic alignment, decreased spasm and resolution of LBP with decreased torso pain  as well as understanding and correct performance of all education and exercises provided today. They will continue to benefit from skilled physical therapy to work toward remaining goals and maximize function as well as decrease likelihood of symptom increase or recurrence.    PT Next Visit Plan Review mini-marches and progress PRN, continue to gently release diaphragm and fascia surrounding hips, abdomen, and back. Assess internally, posture in walking and standing.    PT Home Exercise Plan self c-section release, seated posture, seated pelvic tilts, diaphragmatic breathing,posture in walking and standing, back-relax, 3-way wall stretch, progressive relaxation technique, low back stretch, scapular retractions and chin-tucks, mini-marches, Happy-baby/child's pose, standing quad stretch, Prone press ups, MCAD info, longitudinal chest stretch on foam roller, self fascial release, MET for L anterior rotation.    Consulted and Agree with Plan of Care Patient           Patient will benefit from skilled therapeutic intervention in order to improve the following deficits and impairments:     Visit Diagnosis: Sacrococcygeal disorders, not elsewhere classified  Abnormal posture  Other muscle spasm     Problem List Patient Active Problem List   Diagnosis Date Noted  . Gestational diabetes 06/02/2020  . Arthritis 10/11/2019  . DDD (degenerative disc disease), lumbar 10/11/2019  . Hyperlipidemia 10/11/2019  . Idiopathic urticaria 10/11/2019  . Recurrent major depressive disorder, in partial remission ([place]) 10/11/2019  . Spinal stenosis of lumbar region without neurogenic claudication 10/11/2019  . Type 2 diabetes mellitus without complication, with long-term current use of insulin ([place]) 10/11/2019  . Elevated lactic acid level 09/29/2017  . Leukocytosis 09/29/2017  . Diabetes ([place]) 09/28/2017  . Hypertension 09/28/2017  . Mild asthma without complication 24/73/1924  . Morbid obesity ([place])  09/28/2017  . Thalassemia 09/28/2017  . GAD (generalized anxiety disorder) 07/25/2014  . 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

## 2021-03-01 ENCOUNTER — Ambulatory Visit: Payer: BC Managed Care – PPO

## 2021-03-01 ENCOUNTER — Other Ambulatory Visit: Payer: Self-pay

## 2021-03-01 DIAGNOSIS — M62838 Other muscle spasm: Secondary | ICD-10-CM | POA: Insufficient documentation

## 2021-03-01 DIAGNOSIS — R293 Abnormal posture: Secondary | ICD-10-CM

## 2021-03-01 DIAGNOSIS — M533 Sacrococcygeal disorders, not elsewhere classified: Secondary | ICD-10-CM | POA: Insufficient documentation

## 2021-03-01 NOTE — Patient Instructions (Signed)
  Exhale, engage deep core and then lift and extend one  Leg while keeping the LB pulled into the mat through the whole motion and only relaxing the core once the foot is firmly placed back on the mat. Repeat on the other side. Do 2x15 for each.

## 2021-03-01 NOTE — Therapy (Signed)
[place] MAIN [facility] SERVICES [address] [place], [place], [address] Phone: [phone]   Fax:  [phone]  Physical Therapy Treatment  The patient has been informed of current processes in place at Outpatient Rehab to protect patients from Covid-19 exposure including social distancing, schedule modifications, and new cleaning procedures. After discussing their particular risk with a therapist based on the patient's personal risk factors, the patient has decided to proceed with in-person therapy.  Patient Details  Name: Eileen Chaney MRN: [id number] Date of Birth: 12-16-1975 No data recorded  Encounter Date: 03/01/2021   PT End of Session - 03/01/21 0843    Visit Number 18    Number of Visits 34    Date for PT Re-Evaluation 04/12/21    Authorization Type BCBS    Authorization Time Period 01/16/21 through 04/12/21    Authorization - Visit Number 8    Authorization - Number of Visits 24    Progress Note Due on Visit 20    PT Start Time 0740    PT Stop Time 0840    PT Time Calculation (min) 60 min    Activity Tolerance Patient tolerated treatment well;No increased pain    Behavior During Therapy [facility] for tasks assessed/performed           No past medical history on file.  Past Surgical History:  Procedure Laterality Date  . ABDOMINAL HYSTERECTOMY     uterine fibroids 2018    There were no vitals filed for this visit.  Pelvic Floor Physical Therapy Treatment Note  SCREENING  Changes in medications, allergies, or medical history?: did her gut mapping Bacteriodis fragilis, bifido bacterium SPP,  Basilis SPP, morganella SPP, staphilococcus, streptococchus, citrobacter frundii, zonulin    SUBJECTIVE  Patient reports: She had been doing better over the weekend (averaging ~ 3/10) and was hopeful she was on the other side of things. She was abl to do more physical stuff. She did some gardening, walking up a small hill in her  parents yard etc. but then she has had a bad couple days for LBP/hip pain. Yesterday was really rough. Sitting in a "zero gravity chair" seems to help. Stress levels are still high, has been grinding her teeth. Her husband is trying to change custody. She feels like when things happened with her ankle it threw everything off. Before that she was walking more etc. Before. She had an infection that she had to be treated with antibiotics for and then she had an allergic reaction to the antibiotics. She got her GI map results and she had high levels of bacteria that correlate with ankylosing spondylitis and rheumatoid arthritis and leaky gut. She felt like her UI was getting better but has had several bad days that correlate with her increased LBP.  Precautions:  Has an inflammatory arthritis, Abdominal Hysterectomy, HTN, Diabetes, asthma, Lumbar DDD, Idiopathic Urticaria, GAD, HLD, Leukocytosis, Morbid Obesity, Depression, Spinal stenosis of lumbar region  Sexual activity/pain: Was having pain with both initial penetration and deeper thrusting. Not currently sexually active.  Location of pain: LBP/hips (shoulder/torso) Current pain: 5/10 (0/10) [6/10 with motion in either] Max pain: 8/10 (6/10) Least pain: 0/10 (0/10) Nature of pain:stiffness (sharp, achy)  **4/10 in LB following treatment "definitely less"  Patient Goals: To not feel tied to a bathroom and be able to drink adequate water and exercise.   OBJECTIVE  Changes in: Posture/Observations:  ASIS and ankles appear nearly level in supine, further investigation needed.  12-14-20:  hyperlordotic, PSIS appear level in standing  01/02/21:  hyperlordotic, PSIS appear level in standing  02/22/21: L PSIS high, LLE long in supine, short in sitting  Range of Motion/Flexibilty:  12/21/20: Decreased fascial mobility through entire back with greatest restriction at B lateral waist folds and midline in lumbar region.  01/11/21: decreased  fascial mobility and pain with MFR near R glute med. Region.  01/16/21: decreased spinal mobility and increased sensitivity with PIVM greatest at ~ T7  01/18/21: decreased mobility in L to R dicection through the cervical spine. Decreased PIVM in PA direction near the T/L junction.  01/25/21: decreased fascial mobility along B lateral and anterior thighs and through R>L abdomen.  02/01/21: decreased fascial mobility through the R lateral abdomen along T10-12 nerve distribution.  02/05/21: decreased fascial mobility through lower half of back with greatest at R iliac crest posteriorly.  Strength/MMT:  LE MMT:  Pelvic floor:  Abdominal:   Palpation: 12-14-20: TTP to R supra and infraspinatus and B multifidus at T12-L1 and L5 bilaterally.   01/11/21: TTP to B Iliacus, sartorius and TFL.  01/16/21: TTP to multifidus B at ~T7  01/18/21: TTP to L>R cervical extensors and sub-occipitals.   01/23/21: highly sensitive to T8-L2 region with poor tolerance to medium pressure near paraspinals and multifidus.  **following treatment Pt. Able to tolerate medium pressure with far less sensitivity.  01/25/21: TTP to R diaphragm and superficial abdominal fascia/muscles.  02/01/21: TTP to multifidus and erector spinae near T1-12 with radiation of Sx along the lateral ribcage.  02/05/21: TTP to multifidus and erector spinae near T1-12 on R>L and R>L QL  02/22/21: TTP to B upper trapezius and sacral multifidus.  TODAY:TTP to R>L QL and lumbar extensors with radiating pain into R hip anteriorly and posteriorly.  Gait Analysis:  INTERVENTIONS THIS SESSION: Manual: Performed TP release  to R QL and lumbar erector spinae to decrease spasm and pain and allow for improved balance of musculature for improved function and decreased symptoms. Attempted TP release to L multifidus at ~ L4-5 but Pt. Reported feeling like an allergic reaction was coming on/itching so we discontinued.  Self-care: Educated on the normal  healing timeline for a surgery and that it may be an indicator that there is something not right with her ankle that it is still swollen and painful. Discussed recent gut-mapping and importance of bringing up the ankle when she meets with the functional doc next week.   Theract: Educated when to/not to use heat and/or ice and what exercises to do when the LB pain starts incrementally increasing (disc issues) Vs. Which ones she does for "regular maintenance"   Therex: Reviewed and practiced mini-marches with cues to begin exhale just before starting the motion and to keep the LB pulled into the mat then up-graded to leg extension version.  Total time: 60 min.                               PT Short Term Goals - 01/18/21 2043      PT SHORT TERM GOAL #1   Title Patient will demonstrate coordinated diaphragmatic breathing with pelvic tilts to demonstrate improved control of diaphragm and TA, to allow for further strengthening of core musculature and decreased pelvic floor spasm.    Baseline poor coordination of deep-core (3 finger diastasis)    Time 6    Period Weeks    Status On-going    Target Date  12/21/20      PT SHORT TERM GOAL #2   Title Patient will demonstrate improved sitting and standing posture to demonstrate learning and decrease stress on the pelvic floor with functional activity.    Baseline hyperlordotic/anterior pelvic tilt.    Time 6    Period Weeks    Status On-going    Target Date 12/21/20      PT SHORT TERM GOAL #3   Title Patient will demonstrate HEP x1 in the clinic to demonstrate understanding and proper form to allow for further improvement.    Baseline Pt. lacks knowledge of which therapeutic exercises will decrease her Sx.    Time 6    Period Weeks    Status Achieved    Target Date 12/21/20             PT Long Term Goals - 01/18/21 2045      PT LONG TERM GOAL #1   Title Patient will describe pain no greater than 1/10 during  pelvic exam/intercourse and walking/standing for ~ 1 hr. to demonstrate improved functional ability.    Baseline has LBP with worsens when on her feet for extended time, has been in a boot for ~ 5 months    Time 12    Period Weeks    Status On-going    Target Date 04/12/21      PT LONG TERM GOAL #2   Title Patient will report no episodes of UUI over the course of the prior two weeks to demonstrate improved functional ability.    Baseline She is having UUI daily    Time 12    Period Weeks    Status On-going    Target Date 04/12/21      PT LONG TERM GOAL #3   Title Pt. will improve in FOTO score by 10 points in each category to demonstrate improved function.    Baseline FOTO PFDI Prolapse: 42, Urinary Problem: 46, PFDI Urinary: 42    Time 12    Period Weeks    Status On-going    Target Date 04/12/21                 Plan - 03/01/21 0844    Clinical Impression Statement Pt. Responded well to all interventions today, demonstrating improved coordination with deep-core, decreased LBP and spasm, as well as understanding and correct performance of all education and exercises provided today. They will continue to benefit from skilled physical therapy to work toward remaining goals and maximize function as well as decrease likelihood of symptom increase or recurrence.    PT Next Visit Plan Assess foot mechanics and begin ROM/strengthening. Review and expand/practice walking mechanics. Ask about functional medicine visit. continue to gently release diaphragm and fascia surrounding hips, abdomen, and back. Assess internally, posture in walking and standing.    PT Home Exercise Plan self c-section release, seated posture, seated pelvic tilts, diaphragmatic breathing,posture in walking and standing, back-relax, 3-way wall stretch, progressive relaxation technique, low back stretch, scapular retractions and chin-tucks, mini-marches, Happy-baby/child's pose, standing quad stretch, Prone press ups,  MCAD info, longitudinal chest stretch on foam roller, self fascial release, MET for L anterior rotation, mini-march with leg extensions.    Consulted and Agree with Plan of Care Patient           Patient will benefit from skilled therapeutic intervention in order to improve the following deficits and impairments:     Visit Diagnosis: Sacrococcygeal disorders, not elsewhere classified  Abnormal posture  Other muscle spasm     Problem List Patient Active Problem List   Diagnosis Date Noted  . Gestational diabetes 06/02/2020  . Arthritis 10/11/2019  . DDD (degenerative disc disease), lumbar 10/11/2019  . Hyperlipidemia 10/11/2019  . Idiopathic urticaria 10/11/2019  . Recurrent major depressive disorder, in partial remission ([place]) 10/11/2019  . Spinal stenosis of lumbar region without neurogenic claudication 10/11/2019  . Type 2 diabetes mellitus without complication, with long-term current use of insulin ([place]) 10/11/2019  . Elevated lactic acid level 09/29/2017  . Leukocytosis 09/29/2017  . Diabetes ([place]) 09/28/2017  . Hypertension 09/28/2017  . Mild asthma without complication 22/17/9810  . Morbid obesity ([place]) 09/28/2017  . Thalassemia 09/28/2017  . GAD (generalized anxiety disorder) 07/25/2014  . 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

## 2021-03-02 ENCOUNTER — Ambulatory Visit: Payer: BC Managed Care – PPO | Admitting: Dietician

## 2021-03-05 ENCOUNTER — Encounter: Payer: BC Managed Care – PPO | Admitting: Physical Therapy

## 2021-03-08 ENCOUNTER — Ambulatory Visit: Payer: BC Managed Care – PPO

## 2021-03-08 ENCOUNTER — Other Ambulatory Visit: Payer: Self-pay

## 2021-03-08 DIAGNOSIS — M533 Sacrococcygeal disorders, not elsewhere classified: Secondary | ICD-10-CM | POA: Diagnosis not present

## 2021-03-08 DIAGNOSIS — R293 Abnormal posture: Secondary | ICD-10-CM

## 2021-03-08 DIAGNOSIS — M62838 Other muscle spasm: Secondary | ICD-10-CM

## 2021-03-08 NOTE — Therapy (Addendum)
[place] MAIN [facility] SERVICES [address] [place], [place], [address] Phone: [phone]   Fax:  [phone]  Physical Therapy Treatment  The patient has been informed of current processes in place at Outpatient Rehab to protect patients from Covid-19 exposure including social distancing, schedule modifications, and new cleaning procedures. After discussing their particular risk with a therapist based on the patient's personal risk factors, the patient has decided to proceed with in-person therapy.  Patient Details  Name: Eileen Chaney MRN: [id number] Date of Birth: [birth year] No data recorded  Encounter Date: 03/08/2021   PT End of Session - 03/08/21 0844     Visit Number 19    Number of Visits 34    Date for PT Re-Evaluation 04/12/21    Authorization Type BCBS    Authorization Time Period 01/16/21 through 04/12/21    Authorization - Visit Number 9    Authorization - Number of Visits 24    Progress Note Due on Visit 20    PT Start Time 0730    PT Stop Time 0830    PT Time Calculation (min) 60 min    Activity Tolerance Patient tolerated treatment well;No increased pain    Behavior During Therapy [facility] for tasks assessed/performed             No past medical history on file.  Past Surgical History:  Procedure Laterality Date   ABDOMINAL HYSTERECTOMY     uterine fibroids 2018    There were no vitals filed for this visit.  Pelvic Floor Physical Therapy Treatment Note  SCREENING  Changes in medications, allergies, or medical history?: did her gut mapping Bacteriodis fragilis, bifido bacterium SPP,  Basilis SPP, morganella SPP, staphilococcus, streptococchus, citrobacter frundii, zonulin. On low-dose naltraxone (LDN)   SUBJECTIVE  Patient reports: She has noticed some things are a little better, others are not. She has felt like her overall pain is about a point lower. Her joint pain seems to be a little higher the last few  days. She had a yeast infection over the weekend and had to use an applicator to apply medication. It was very painful to insert and she has had increased urinary frequency this week. Her ankle and hand joints are what seems to be bothering her.   Precautions:  Has an inflammatory arthritis, Abdominal Hysterectomy, HTN, Diabetes, asthma, Lumbar DDD, Idiopathic Urticaria, GAD, HLD, Leukocytosis, Morbid Obesity, Depression, Spinal stenosis of lumbar region  Sexual activity/pain: Was having pain with both initial penetration and deeper thrusting. Not currently sexually active.   Location of pain: LBP/hips (shoulder/torso) Current pain:  3/10 (0/10) [2/10 with motion in either] Max pain:  4/10 (4/10) Least pain:  0/10 (0/10) Nature of pain: stiffness (sharp, achy)  ** 2/10 cramping following treatment. No L hip pain.  Patient Goals: To not feel tied to a bathroom and be able to drink adequate water and exercise.   OBJECTIVE  Changes in: Posture/Observations:  ASIS and ankles appear nearly level in supine, further investigation needed.  12-14-20: hyperlordotic, PSIS appear level in standing  01/02/21:  hyperlordotic, PSIS appear level in standing  02/22/21: L PSIS high, LLE long in supine, short in sitting  Range of Motion/Flexibilty:  12/21/20: Decreased fascial mobility through entire back with greatest restriction at B lateral waist folds and midline in lumbar region.  01/11/21: decreased fascial mobility and pain with MFR near R glute med. Region.  01/16/21: decreased spinal mobility and increased sensitivity with PIVM greatest at ~  T7  01/18/21: decreased mobility in L to R dicection through the cervical spine. Decreased PIVM in PA direction near the T/L junction.  01/25/21: decreased fascial mobility along B lateral and anterior thighs and through R>L abdomen.  02/01/21: decreased fascial mobility through the R lateral abdomen along T10-12 nerve distribution.  02/05/21: decreased  fascial mobility through lower half of back with greatest at R iliac crest posteriorly.  Strength/MMT:  LE MMT:   Pelvic Floor External Exam: Introitus Appears: elevated Skin integrity: dry Palpation: TTP to all externally except R IC Cough: minimal motion Prolapse visible?: no Scar mobility: restriction noted at posterior fourchette.  Internal Vaginal Exam: Strength (PERF):  Symmetry: Palpation: TTP to anterior PR/PC on R (others not assessed d/t time) Prolapse:   Internal Rectal Exam: Strength (PERF): Symmetry: Palpation: Prolapse:   Abdominal:   Palpation: 12-14-20: TTP to R supra and infraspinatus and B multifidus at T12-L1 and L5 bilaterally.   01/11/21: TTP to B Iliacus, sartorius and TFL.  01/16/21: TTP to multifidus B at ~T7  01/18/21: TTP to L>R cervical extensors and sub-occipitals.   01/23/21: highly sensitive to T8-L2 region with poor tolerance to medium pressure near paraspinals and multifidus.  **following treatment Pt. Able to tolerate medium pressure with far less sensitivity.  01/25/21: TTP to R diaphragm and superficial abdominal fascia/muscles.  02/01/21: TTP to multifidus and erector spinae near T1-12 with radiation of Sx along the lateral ribcage.  02/05/21: TTP to multifidus and erector spinae near T1-12 on R>L and R>L QL  02/22/21: TTP to B upper trapezius and sacral multifidus.  03/01/21:TTP to R>L QL and lumbar extensors with radiating pain into R hip anteriorly and posteriorly.  TODAY: TTP to B adductors.  Gait Analysis:  INTERVENTIONS THIS SESSION: Manual: Assessed and released 1st and 2nd layer PFM as well as R anterior PR/PC to decrease spasm and pain and allow for improved balance of musculature for improved function and decreased symptoms.  Self-care: Educated on false UTI Sx. And PFM tension as well as vaginal dryness causes and potential treatment, encouraged to ask physician about checking hormones or trial of premarin etc. Educated on  use of coconut/Vit E suppositories as option.  Total time: 60 min.                              PT Short Term Goals - 01/18/21 2043       PT SHORT TERM GOAL #1   Title Patient will demonstrate coordinated diaphragmatic breathing with pelvic tilts to demonstrate improved control of diaphragm and TA, to allow for further strengthening of core musculature and decreased pelvic floor spasm.    Baseline poor coordination of deep-core (3 finger diastasis)    Time 6    Period Weeks    Status On-going    Target Date 12/21/20      PT SHORT TERM GOAL #2   Title Patient will demonstrate improved sitting and standing posture to demonstrate learning and decrease stress on the pelvic floor with functional activity.    Baseline hyperlordotic/anterior pelvic tilt.    Time 6    Period Weeks    Status On-going    Target Date 12/21/20      PT SHORT TERM GOAL #3   Title Patient will demonstrate HEP x1 in the clinic to demonstrate understanding and proper form to allow for further improvement.    Baseline Pt. lacks knowledge of which therapeutic exercises will decrease her  Sx.    Time 6    Period Weeks    Status Achieved    Target Date 12/21/20               PT Long Term Goals - 01/18/21 2045       PT LONG TERM GOAL #1   Title Patient will describe pain no greater than 1/10 during pelvic exam/intercourse and walking/standing for ~ 1 hr. to demonstrate improved functional ability.    Baseline has LBP with worsens when on her feet for extended time, has been in a boot for ~ 5 months    Time 12    Period Weeks    Status On-going    Target Date 04/12/21      PT LONG TERM GOAL #2   Title Patient will report no episodes of UUI over the course of the prior two weeks to demonstrate improved functional ability.    Baseline She is having UUI daily    Time 12    Period Weeks    Status On-going    Target Date 04/12/21      PT LONG TERM GOAL #3   Title Pt. will  improve in FOTO score by 10 points in each category to demonstrate improved function.    Baseline FOTO PFDI Prolapse: 42, Urinary Problem: 46, PFDI Urinary: 42    Time 12    Period Weeks    Status On-going    Target Date 04/12/21                   Plan - 03/08/21 0844     Clinical Impression Statement Pt. Responded well to initial release through the PFM demonstrating decreased spasm and pain, improved PFM and lower abdominal relaxation. Unfortunately, her body exhibited a delayed guarding/trauma  response that has caused increased spasms and pain through the PFM as well as the hip-flexors and LB extensors. I believe she would benefit from vaginal valium used before her treatments to allow for an appropriate response without the delayed rebound pain. She demonstrated understanding and correct performance of all education and exercises provided today. They will continue to benefit from skilled physical therapy to work toward remaining goals and maximize function as well as decrease likelihood of symptom increase or recurrence.     PT Next Visit Plan Assess foot mechanics and begin ROM/strengthening. Review and expand/practice walking mechanics. Ask about functional medicine visit. continue to gently release diaphragm and fascia surrounding hips, abdomen, and back. Assess internally, posture in walking and standing.    PT Home Exercise Plan self c-section release, seated posture, seated pelvic tilts, diaphragmatic breathing,posture in walking and standing, back-relax, 3-way wall stretch, progressive relaxation technique, low back stretch, scapular retractions and chin-tucks, mini-marches, Happy-baby/child's pose, standing quad stretch, Prone press ups, MCAD info, longitudinal chest stretch on foam roller, self fascial release, MET for L anterior rotation, mini-march with leg extensions.    Consulted and Agree with Plan of Care Patient             Patient will benefit from skilled  therapeutic intervention in order to improve the following deficits and impairments:     Visit Diagnosis: Sacrococcygeal disorders, not elsewhere classified  Abnormal posture  Other muscle spasm     Problem List Patient Active Problem List   Diagnosis Date Noted   Gestational diabetes 06/02/2020   Arthritis 10/11/2019   DDD (degenerative disc disease), lumbar 10/11/2019   Hyperlipidemia 10/11/2019   Idiopathic urticaria 10/11/2019   Recurrent  major depressive disorder, in partial remission ([place]) 10/11/2019   Spinal stenosis of lumbar region without neurogenic claudication 10/11/2019   Type 2 diabetes mellitus without complication, with long-term current use of insulin ([place]) 10/11/2019   Elevated lactic acid level 09/29/2017   Leukocytosis 09/29/2017   Diabetes ([place]) 09/28/2017   Hypertension 09/28/2017   Mild asthma without complication 73/22/0254   Morbid obesity ([place]) 09/28/2017   Thalassemia 09/28/2017   GAD (generalized anxiety disorder) 07/25/2014   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

## 2021-03-08 NOTE — Patient Instructions (Signed)
   Look into female hormone panel. Ask about premarin etc.

## 2021-03-13 ENCOUNTER — Encounter: Payer: BC Managed Care – PPO | Admitting: Physical Therapy

## 2021-03-15 ENCOUNTER — Ambulatory Visit: Payer: BC Managed Care – PPO

## 2021-03-15 ENCOUNTER — Other Ambulatory Visit: Payer: Self-pay

## 2021-03-15 DIAGNOSIS — M62838 Other muscle spasm: Secondary | ICD-10-CM

## 2021-03-15 DIAGNOSIS — M533 Sacrococcygeal disorders, not elsewhere classified: Secondary | ICD-10-CM | POA: Diagnosis not present

## 2021-03-15 DIAGNOSIS — R293 Abnormal posture: Secondary | ICD-10-CM

## 2021-03-15 NOTE — Therapy (Signed)
[place] MAIN [facility] SERVICES [address] [place], [place], [address] Phone: [phone]   Fax:  [phone]  Physical Therapy Treatment  The patient has been informed of current processes in place at Outpatient Rehab to protect patients from Covid-19 exposure including social distancing, schedule modifications, and new cleaning procedures. After discussing their particular risk with a therapist based on the patient's personal risk factors, the patient has decided to proceed with in-person therapy.   Patient Details  Name: Eileen Chaney MRN: [id number] Date of Birth: November 12, 1975 No data recorded  Encounter Date: 03/15/2021   PT End of Session - 03/15/21 0825     Visit Number 20    Number of Visits 34    Date for PT Re-Evaluation 04/12/21    Authorization Type BCBS    Authorization Time Period 01/16/21 through 04/12/21    Authorization - Visit Number 10    Authorization - Number of Visits 24    Progress Note Due on Visit 20    PT Start Time 0730    PT Stop Time 0830    PT Time Calculation (min) 60 min    Activity Tolerance Patient tolerated treatment well;No increased pain    Behavior During Therapy [facility] for tasks assessed/performed             No past medical history on file.  Past Surgical History:  Procedure Laterality Date   ABDOMINAL HYSTERECTOMY     uterine fibroids 2018    There were no vitals filed for this visit.  Pelvic Floor Physical Therapy Treatment Note  SCREENING  Changes in medications, allergies, or medical history?: did her gut mapping Bacteriodis fragilis, bifido bacterium SPP,  Basilis SPP, morganella SPP, staphilococcus, streptococchus, citrobacter frundii, zonulin. On low-dose naltraxone (LDN)   SUBJECTIVE  Patient reports: Today is a high pain day, yesterday was too. She had been doing well Saturday and Sunday. Had an ear infection/went to the doctor on Monday. Started feeling better by the next  day. She has continued to have UUI and it worsened (leakage without sensory awareness) when the PFM were in spasm following the internal work last week but then normalized.  Precautions:  Has an inflammatory arthritis, Abdominal Hysterectomy, HTN, Diabetes, asthma, Lumbar DDD, Idiopathic Urticaria, GAD, HLD, Leukocytosis, Morbid Obesity, Depression, Spinal stenosis of lumbar region  Sexual activity/pain: Was having pain with both initial penetration and deeper thrusting. Not currently sexually active.   Location of pain: LBP/hips (shoulder/torso) Current pain:  8/10 (0/10) [6/10 with motion in either] Max pain:  8/10 (6/10) Least pain:  0/10 (0/10) Nature of pain: stiffness (sharp, achy)  ** 6/10 "achy not sharp now" pain following treatment.   Patient Goals: To not feel tied to a bathroom and be able to drink adequate water and exercise.   OBJECTIVE  Changes in: Posture/Observations:  ASIS and ankles appear nearly level in supine, further investigation needed.  12-14-20: hyperlordotic, PSIS appear level in standing  01/02/21:  hyperlordotic, PSIS appear level in standing  02/22/21: L PSIS high, LLE long in supine, short in sitting  Range of Motion/Flexibilty:  12/21/20: Decreased fascial mobility through entire back with greatest restriction at B lateral waist folds and midline in lumbar region.  01/11/21: decreased fascial mobility and pain with MFR near R glute med. Region.  01/16/21: decreased spinal mobility and increased sensitivity with PIVM greatest at ~ T7  01/18/21: decreased mobility in L to R dicection through the cervical spine. Decreased PIVM in PA direction  near the T/L junction.  01/25/21: decreased fascial mobility along B lateral and anterior thighs and through R>L abdomen.  02/01/21: decreased fascial mobility through the R lateral abdomen along T10-12 nerve distribution.  02/05/21: decreased fascial mobility through lower half of back with greatest at R iliac crest  posteriorly.  Strength/MMT:  LE MMT:   Pelvic Floor External Exam: Introitus Appears: elevated Skin integrity: dry Palpation: TTP to all externally except R IC Cough: minimal motion Prolapse visible?: no Scar mobility: restriction noted at posterior fourchette.  Internal Vaginal Exam: Strength (PERF):  Symmetry: Palpation: TTP to anterior PR/PC on R (others not assessed d/t time) Prolapse:   Internal Rectal Exam: Strength (PERF): Symmetry: Palpation: Prolapse:   Abdominal:   Palpation: 12-14-20: TTP to R supra and infraspinatus and B multifidus at T12-L1 and L5 bilaterally.   01/11/21: TTP to B Iliacus, sartorius and TFL.  01/16/21: TTP to multifidus B at ~T7  01/18/21: TTP to L>R cervical extensors and sub-occipitals.   01/23/21: highly sensitive to T8-L2 region with poor tolerance to medium pressure near paraspinals and multifidus.  **following treatment Pt. Able to tolerate medium pressure with far less sensitivity.  01/25/21: TTP to R diaphragm and superficial abdominal fascia/muscles.  02/01/21: TTP to multifidus and erector spinae near T1-12 with radiation of Sx along the lateral ribcage.  02/05/21: TTP to multifidus and erector spinae near T1-12 on R>L and R>L QL  02/22/21: TTP to B upper trapezius and sacral multifidus.  03/01/21:TTP to R>L QL and lumbar extensors with radiating pain into R hip anteriorly and posteriorly.  03/08/21: TTP to B adductors.  TODAY: TTP to B iliacus, psoas, lumbar and thoracic multifidus worst near T/L and L/S junctions.  Gait Analysis:  INTERVENTIONS THIS SESSION: Manual: Performed TP release and STM to B iliacus and multifidus near the T/L and L/S junctions to decrease spasm and pain and allow for improved balance of musculature for improved function and decreased symptoms.  Dry-needle: Performed TPDN with a .30x189m and a .30x716mneedle and standard approach as described below to decrease spasm and pain and allow for improved  balance of musculature for improved function and decreased symptoms.  Therex: reviewed and practiced posterior pelvic tilts, hip-flexor stretches and seated posture and explained why it is important for her to be practicing them regularly to prevent return of spasms.  Total time: 60 min.                         Trigger Point Dry Needling - 03/15/21 0001     Consent Given? Yes    Education Handout Provided No    Muscles Treated Back/Hip Lumbar multifidi;Iliacus    Iliacus Response Twitch response elicited;Palpable increased muscle length    Erector spinae Response Twitch response elicited;Palpable increased muscle length    Lumbar multifidi Response Twitch response elicited;Palpable increased muscle length                    PT Short Term Goals - 03/15/21 0825       PT SHORT TERM GOAL #1   Title Patient will demonstrate coordinated diaphragmatic breathing with pelvic tilts to demonstrate improved control of diaphragm and TA, to allow for further strengthening of core musculature and decreased pelvic floor spasm.    Baseline poor coordination of deep-core (3 finger diastasis)    Time 6    Period Weeks    Status Achieved    Target Date 12/21/20  PT SHORT TERM GOAL #2   Title Patient will demonstrate improved sitting and standing posture to demonstrate learning and decrease stress on the pelvic floor with functional activity.    Baseline hyperlordotic/anterior pelvic tilt.    Time 6    Period Weeks    Status Achieved    Target Date 12/21/20      PT SHORT TERM GOAL #3   Title Patient will demonstrate HEP x1 in the clinic to demonstrate understanding and proper form to allow for further improvement.    Baseline Pt. lacks knowledge of which therapeutic exercises will decrease her Sx.    Time 6    Period Weeks    Status Achieved    Target Date 12/21/20               PT Long Term Goals - 03/15/21 0826       PT LONG TERM GOAL #1   Title  Patient will describe pain no greater than 1/10 during pelvic exam/intercourse and walking/standing for ~ 1 hr. to demonstrate improved functional ability.    Baseline has LBP with worsens when on her feet for extended time, has been in a boot for ~ 5 months    Time 12    Period Weeks    Status On-going    Target Date 06/07/21      PT LONG TERM GOAL #2   Title Patient will report no episodes of UUI over the course of the prior two weeks to demonstrate improved functional ability.    Baseline She is having UUI daily    Time 12    Period Weeks    Status On-going    Target Date 06/07/21      PT LONG TERM GOAL #3   Title Pt. will improve in FOTO score by 10 points in each category to demonstrate improved function.    Baseline FOTO PFDI Prolapse: 42, Urinary Problem: 46, PFDI Urinary: 42    Time 12    Period Weeks    Status On-going    Target Date 06/07/21                   Plan - 03/15/21 0839     Clinical Impression Statement Pt. has met short-term goals and is making slow but steady progress toward her long-term goals as well. She had 2 days this week with little to no pain and was able to tolerate an internal exam last week though she did have rebound pain 2 days following the treatment. She responded well to all interventions today with no nausea and pain decreased from 8/10 to 3/10. The chronic and comlex nature of her systemic and musculoskeletal conditions are forcing a longer plan of care than originally expected but she is making progress and will continue to benefit from skilled pelvic health PT on a weekly basis for 12 more weeks.    Personal Factors and Comorbidities Comorbidity 3+    Comorbidities Abdominal Hysterectomy, HTN, Diabetes, asthma, Lumbar DDD, Idiopathic Urticaria, GAD, HLD, Leukocytosis, Morbid Obesity, Depression, Spinal stenosis of lumbar region    Examination-Activity Limitations Toileting;Continence    Examination-Participation Restrictions  Interpersonal Relationship;Occupation;Community Activity    Stability/Clinical Decision Making Unstable/Unpredictable    Clinical Decision Making High    Rehab Potential Good    PT Frequency 1x / week    PT Duration 12 weeks    PT Treatment/Interventions ADLs/Self Care Home Management;Biofeedback;Electrical Stimulation;Moist Heat;Ultrasound;Therapeutic activities;Functional mobility training;Gait training;Therapeutic exercise;Neuromuscular re-education;Patient/family education;Manual techniques;Scar mobilization;Dry needling;Passive range  of motion;Taping;Spinal Manipulations;Joint Manipulations    PT Next Visit Plan Assess foot mechanics and begin ROM/strengthening. Review and expand/practice walking mechanics. continue to gently release diaphragm and fascia surrounding hips, abdomen, and back. re-assess internally, review posture in walking and standing.    PT Home Exercise Plan self c-section release, seated posture, seated pelvic tilts, diaphragmatic breathing,posture in walking and standing, back-relax, 3-way wall stretch, progressive relaxation technique, low back stretch, scapular retractions and chin-tucks, mini-marches, Happy-baby/child's pose, standing quad stretch, Prone press ups, MCAD info, longitudinal chest stretch on foam roller, self fascial release, MET for L anterior rotation, mini-march with leg extensions.    Consulted and Agree with Plan of Care Patient             Patient will benefit from skilled therapeutic intervention in order to improve the following deficits and impairments:  Decreased activity tolerance, Increased fascial restricitons, Improper body mechanics, Pain, Obesity, Postural dysfunction, Increased muscle spasms, Decreased scar mobility, Decreased coordination  Visit Diagnosis: Sacrococcygeal disorders, not elsewhere classified  Abnormal posture  Other muscle spasm     Problem List Patient Active Problem List   Diagnosis Date Noted   Gestational  diabetes 06/02/2020   Arthritis 10/11/2019   DDD (degenerative disc disease), lumbar 10/11/2019   Hyperlipidemia 10/11/2019   Idiopathic urticaria 10/11/2019   Recurrent major depressive disorder, in partial remission ([place]) 10/11/2019   Spinal stenosis of lumbar region without neurogenic claudication 10/11/2019   Type 2 diabetes mellitus without complication, with long-term current use of insulin ([place]) 10/11/2019   Elevated lactic acid level 09/29/2017   Leukocytosis 09/29/2017   Diabetes ([place]) 09/28/2017   Hypertension 09/28/2017   Mild asthma without complication 93/73/4287   Morbid obesity ([place]) 09/28/2017   Thalassemia 09/28/2017   GAD (generalized anxiety disorder) 07/25/2014   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

## 2021-03-19 ENCOUNTER — Encounter: Payer: BC Managed Care – PPO | Admitting: Physical Therapy

## 2021-03-22 ENCOUNTER — Other Ambulatory Visit: Payer: Self-pay

## 2021-03-22 ENCOUNTER — Ambulatory Visit: Payer: BC Managed Care – PPO

## 2021-03-22 DIAGNOSIS — M62838 Other muscle spasm: Secondary | ICD-10-CM

## 2021-03-22 DIAGNOSIS — M533 Sacrococcygeal disorders, not elsewhere classified: Secondary | ICD-10-CM

## 2021-03-22 DIAGNOSIS — R293 Abnormal posture: Secondary | ICD-10-CM

## 2021-03-22 NOTE — Therapy (Signed)
[place] [facility] SERVICES [address] [place], [place], [address] Phone: [phone]   Fax:  [phone]  Physical Therapy Treatment  The patient has been informed of current processes in place at Outpatient Rehab to protect patients from Covid-19 exposure including social distancing, schedule modifications, and new cleaning procedures. After discussing their particular risk with a therapist based on the patient's personal risk factors, the patient has decided to proceed with in-person therapy.   Patient Details  Name: Eileen Chaney MRN: [id number] Date of Birth: 08-Apr-1976 No data recorded  Encounter Date: 03/22/2021   PT End of Session - 03/22/21 0851     Visit Number 21    Number of Visits 34    Date for PT Re-Evaluation 04/12/21    Authorization Type [employer]    Authorization Time Period 03/15/21 through 05/08/21    Authorization - Visit Number 1    Authorization - Number of Visits 12    Progress Note Due on Visit 30    PT Start Time 8466    PT Stop Time 0835    PT Time Calculation (min) 60 min    Activity Tolerance Patient tolerated treatment well;No increased pain    Behavior During Therapy [facility] for tasks assessed/performed             No past medical history on file.  Past Surgical History:  Procedure Laterality Date   ABDOMINAL HYSTERECTOMY     uterine fibroids 2018    There were no vitals filed for this visit.   Pelvic Floor Physical Therapy Treatment Note  SCREENING  Changes in medications, allergies, or medical history?: did her gut mapping Bacteriodis fragilis, bifido bacterium SPP,  Basilis SPP, morganella SPP, staphilococcus, streptococchus, citrobacter frundii, zonulin. On low-dose naltraxone (LDN)   SUBJECTIVE  Patient reports: Her RLE, LB and mid-back were hurting this week, she took more ibuprofen this week. She had a lot of stomach issues this week, having diarrhea. Stress is higher this week. Her  foot pain ranges from 2 to 5.   Precautions:  Has an inflammatory arthritis, Abdominal Hysterectomy, HTN, Diabetes, asthma, Lumbar DDD, Idiopathic Urticaria, GAD, HLD, Leukocytosis, Morbid Obesity, Depression, Spinal stenosis of lumbar region  Sexual activity/pain: Was having pain with both initial penetration and deeper thrusting. Not currently sexually active.   Location of pain: LBP/hips (shoulder/torso) Current pain:  0/10 (0/10) [4/10 with motion in either] Max pain:  8/10 (6/10) Least pain:  0/10 (0/10) Nature of pain: stiffness (sharp, achy)  ** not having pain in the hips in standing/with motion following treatment.  Patient Goals: To not feel tied to a bathroom and be able to drink adequate water and exercise.   OBJECTIVE  Changes in: Posture/Observations:  ASIS and ankles appear nearly level in supine, further investigation needed.  12-14-20: hyperlordotic, PSIS appear level in standing  01/02/21:  hyperlordotic, PSIS appear level in standing  02/22/21: L PSIS high, LLE long in supine, short in sitting  Range of Motion/Flexibilty:  12/21/20: Decreased fascial mobility through entire back with greatest restriction at B lateral waist folds and midline in lumbar region.  01/11/21: decreased fascial mobility and pain with MFR near R glute med. Region.  01/16/21: decreased spinal mobility and increased sensitivity with PIVM greatest at ~ T7  01/18/21: decreased mobility in L to R dicection through the cervical spine. Decreased PIVM in PA direction near the T/L junction.  01/25/21: decreased fascial mobility along B lateral and anterior thighs and through R>L  abdomen.  02/01/21: decreased fascial mobility through the R lateral abdomen along T10-12 nerve distribution.  02/05/21: decreased fascial mobility through lower half of back with greatest at R iliac crest posteriorly.  TODAY: Pt. Demonstrates compensatory motion to achieve "v-sit" stretch due to adductor  tightness.  **following treatment reports much less tension and demonstrates greater hip ABD ROM.  Strength/MMT:  LE MMT:   Pelvic Floor External Exam: Introitus Appears: elevated Skin integrity: dry Palpation: TTP to all externally except R IC Cough: minimal motion Prolapse visible?: no Scar mobility: restriction noted at posterior fourchette.  Internal Vaginal Exam: Strength (PERF):  Symmetry: Palpation: TTP to anterior PR/PC on R (others not assessed d/t time) Prolapse:   Internal Rectal Exam: Strength (PERF): Symmetry: Palpation: Prolapse:   Abdominal:  Pt. Was using breathing paradoxically with  mini-march exercise and not engaging the TA.  **able to demonstrate with appropriate coordination following review  Palpation: 12-14-20: TTP to R supra and infraspinatus and B multifidus at T12-L1 and L5 bilaterally.   01/11/21: TTP to B Iliacus, sartorius and TFL.  01/16/21: TTP to multifidus B at ~T7  01/18/21: TTP to L>R cervical extensors and sub-occipitals.   01/23/21: highly sensitive to T8-L2 region with poor tolerance to medium pressure near paraspinals and multifidus.  **following treatment Pt. Able to tolerate medium pressure with far less sensitivity.  01/25/21: TTP to R diaphragm and superficial abdominal fascia/muscles.  02/01/21: TTP to multifidus and erector spinae near T1-12 with radiation of Sx along the lateral ribcage.  02/05/21: TTP to multifidus and erector spinae near T1-12 on R>L and R>L QL  02/22/21: TTP to B upper trapezius and sacral multifidus.  03/01/21:TTP to R>L QL and lumbar extensors with radiating pain into R hip anteriorly and posteriorly.  03/08/21: TTP to B adductors.  03/15/21: TTP to B iliacus, psoas, lumbar and thoracic multifidus worst near T/L and L/S junctions.  TODAY: Highly TTP to B adductors.  Gait Analysis:  INTERVENTIONS THIS SESSION: Manual: Performed TP release and STM to B adductor magnus and gracilis to decrease spasm  and pain and allow for improved balance of musculature for improved function and decreased symptoms.  Dry-needle: Performed TPDN with a .30x16mm needle and standard approach as described below to decrease spasm and pain and allow for improved balance of musculature for improved function and decreased symptoms.  Therex: reviewed and practiced mini-marches and emphasized the TA recruitment and exhale on exertion as PT. Was not performing correctly at home. Reviewed options for adductor stretches and how to isolate them in v-sit. Educated on and practiced side-lying hip ABD with exhale starting just before lift to improve timing and recruitment of the deep-core to prevent over-use of lumbar extensors and to down-regulate adductors via reciprocal inhibition.  Total time: 60 min.                                PT Short Term Goals - 03/15/21 0825       PT SHORT TERM GOAL #1   Title Patient will demonstrate coordinated diaphragmatic breathing with pelvic tilts to demonstrate improved control of diaphragm and TA, to allow for further strengthening of core musculature and decreased pelvic floor spasm.    Baseline poor coordination of deep-core (3 finger diastasis)    Time 6    Period Weeks    Status Achieved    Target Date 12/21/20      PT SHORT TERM GOAL #2  Title Patient will demonstrate improved sitting and standing posture to demonstrate learning and decrease stress on the pelvic floor with functional activity.    Baseline hyperlordotic/anterior pelvic tilt.    Time 6    Period Weeks    Status Achieved    Target Date 12/21/20      PT SHORT TERM GOAL #3   Title Patient will demonstrate HEP x1 in the clinic to demonstrate understanding and proper form to allow for further improvement.    Baseline Pt. lacks knowledge of which therapeutic exercises will decrease her Sx.    Time 6    Period Weeks    Status Achieved    Target Date 12/21/20               PT  Long Term Goals - 03/15/21 0826       PT LONG TERM GOAL #1   Title Patient will describe pain no greater than 1/10 during pelvic exam/intercourse and walking/standing for ~ 1 hr. to demonstrate improved functional ability.    Baseline has LBP with worsens when on her feet for extended time, has been in a boot for ~ 5 months    Time 12    Period Weeks    Status On-going    Target Date 06/07/21      PT LONG TERM GOAL #2   Title Patient will report no episodes of UUI over the course of the prior two weeks to demonstrate improved functional ability.    Baseline She is having UUI daily    Time 12    Period Weeks    Status On-going    Target Date 06/07/21      PT LONG TERM GOAL #3   Title Pt. will improve in FOTO score by 10 points in each category to demonstrate improved function.    Baseline FOTO PFDI Prolapse: 42, Urinary Problem: 46, PFDI Urinary: 42    Time 12    Period Weeks    Status On-going    Target Date 06/07/21                   Plan - 03/22/21 0852     Clinical Impression Statement Pt. Responded well to all interventions today, demonstrating improved deep-core recruitment, timing, and coordination, decreased spasm and pain, as well as understanding and correct performance of all education and exercises provided today. They will continue to benefit from skilled physical therapy to work toward remaining goals and maximize function as well as decrease likelihood of symptom increase or recurrence.     PT Next Visit Plan review HEP and streamline, Assess foot mechanics and begin ROM/strengthening. Review and expand/practice walking mechanics. continue to gently release diaphragm and fascia surrounding hips, inner thighs, abdomen, and back. re-assess internally, review posture in walking and standing.    PT Home Exercise Plan self c-section release, seated posture, seated pelvic tilts, diaphragmatic breathing,posture in walking and standing, back-relax, 3-way wall stretch,  progressive relaxation technique, low back stretch, scapular retractions and chin-tucks, mini-marches, Happy-baby/child's pose, standing quad stretch, Prone press ups, MCAD info, longitudinal chest stretch on foam roller, self fascial release, MET for L anterior rotation, mini-march with leg extensions, side-lying hip ABD.    Consulted and Agree with Plan of Care Patient             Patient will benefit from skilled therapeutic intervention in order to improve the following deficits and impairments:     Visit Diagnosis: Sacrococcygeal disorders, not elsewhere classified  Abnormal posture  Other muscle spasm     Problem List Patient Active Problem List   Diagnosis Date Noted   Gestational diabetes 06/02/2020   Arthritis 10/11/2019   DDD (degenerative disc disease), lumbar 10/11/2019   Hyperlipidemia 10/11/2019   Idiopathic urticaria 10/11/2019   Recurrent major depressive disorder, in partial remission ([place]) 10/11/2019   Spinal stenosis of lumbar region without neurogenic claudication 10/11/2019   Type 2 diabetes mellitus without complication, with long-term current use of insulin ([place]) 10/11/2019   Elevated lactic acid level 09/29/2017   Leukocytosis 09/29/2017   Diabetes ([place]) 09/28/2017   Hypertension 09/28/2017   Mild asthma without complication 39/08/2582   Morbid obesity ([place]) 09/28/2017   Thalassemia 09/28/2017   GAD (generalized anxiety disorder) 07/25/2014   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 [place], [place], [address] Phone: [phone]   Fax:  [phone]  Name: Eileen Chaney MRN: [id number] Date of Birth: [birth year]

## 2021-03-22 NOTE — Patient Instructions (Addendum)
  Sit with a tall spine and slowly hinge the torso toward the floor. Hold for 5 deep belly breaths and repeat 2-3 times.   Hip Abduction: Side Leg Lift - Side-Lying    Lie on side. Exhale and draw lower tummy muscle (TA) and pelvic floor in, Lift top leg until you feel strong contraction of muscle on the side of the hip. Keep top leg straight with body, toes pointing forward. Do not let your hip roll back! Do _10__ reps per set, __3_ sets per day

## 2021-03-26 ENCOUNTER — Encounter: Payer: BC Managed Care – PPO | Admitting: Physical Therapy

## 2021-03-29 ENCOUNTER — Other Ambulatory Visit: Payer: Self-pay

## 2021-03-29 ENCOUNTER — Ambulatory Visit: Payer: BC Managed Care – PPO

## 2021-03-29 DIAGNOSIS — M62838 Other muscle spasm: Secondary | ICD-10-CM

## 2021-03-29 DIAGNOSIS — M533 Sacrococcygeal disorders, not elsewhere classified: Secondary | ICD-10-CM

## 2021-03-29 DIAGNOSIS — R293 Abnormal posture: Secondary | ICD-10-CM

## 2021-03-29 NOTE — Therapy (Signed)
[place] MAIN [facility] SERVICES [address] [place], [place], [address] Phone: [phone]   Fax:  [phone]  Physical Therapy Treatment  The patient has been informed of current processes in place at Outpatient Rehab to protect patients from Covid-19 exposure including social distancing, schedule modifications, and new cleaning procedures. After discussing their particular risk with a therapist based on the patient's personal risk factors, the patient has decided to proceed with in-person therapy.  Patient Details  Name: Eileen Chaney MRN: [id number] Date of Birth: 14-Jul-1976 No data recorded  Encounter Date: 03/29/2021   PT End of Session - 03/30/21 0738     Visit Number 22    Number of Visits 34    Date for PT Re-Evaluation 04/12/21    Authorization Type BCBS    Authorization Time Period 03/15/21 through 05/08/21    Authorization - Visit Number 2    Authorization - Number of Visits 12    Progress Note Due on Visit 30    PT Start Time 1230    PT Stop Time 1330    PT Time Calculation (min) 60 min    Activity Tolerance Patient tolerated treatment well;No increased pain    Behavior During Therapy [facility] for tasks assessed/performed             No past medical history on file.  Past Surgical History:  Procedure Laterality Date   ABDOMINAL HYSTERECTOMY     uterine fibroids 2018    There were no vitals filed for this visit.  Pelvic Floor Physical Therapy Treatment Note  SCREENING  Changes in medications, allergies, or medical history?: Has started the next phase of her GI reset which is killing off the bad bacteria in her gut.   SUBJECTIVE  Patient reports: Friday she was rushing to the bathroom and the shoe flipped off the back of her foot so she was going into a split and then caught herself with her L hand and her l leg out in front and twisted the L ankle. She had immediate pain in the L HS and feels like she heard  something like a pop. Was limping around on that ankle for a couple days. Pain started ramping up globally within a day. Has ~ 5/10 pain in the L HS when sitting or getting in/out of car. Starting on Saturday and increasing through Monday, her systemic pain and inflammatory Sx. Have been elevated. Taking the vaginal valium did take some of the edge of the pain away.  Precautions:  Has an inflammatory arthritis, Abdominal Hysterectomy, HTN, Diabetes, asthma, Lumbar DDD, Idiopathic Urticaria, GAD, HLD, Leukocytosis, Morbid Obesity, Depression, Spinal stenosis of lumbar region  Sexual activity/pain: Was having pain with both initial penetration and deeper thrusting. Not currently sexually active.   Location of pain: LBP/hips (shoulder/torso) Current pain:  7/10 (0/10) [7/10 with motion in either] Max pain:  8/10 (6/10) Least pain:  0/10 (0/10) Nature of pain: stiffness (sharp, achy)  ** 5/10 pain in the LB following treatment. Still having upper/mid back pain (TPDN still "working" at end of session)   Patient Goals: To not feel tied to a bathroom and be able to drink adequate water and exercise.   OBJECTIVE  Changes in: Posture/Observations:  ASIS and ankles appear nearly level in supine, further investigation needed.  12-14-20: hyperlordotic, PSIS appear level in standing  01/02/21:  hyperlordotic, PSIS appear level in standing  02/22/21: L PSIS high, LLE long in supine, short in sitting  TODAY:  L PSIS high, R knee slightly bent. L long in lying, short in sitting (L anterior Rotation)   **following treatment PSIS appear level in standing, ASIS and ankles level in supine.   Range of Motion/Flexibilty:  12/21/20: Decreased fascial mobility through entire back with greatest restriction at B lateral waist folds and midline in lumbar region.  01/11/21: decreased fascial mobility and pain with MFR near R glute med. Region.  01/16/21: decreased spinal mobility and increased sensitivity with  PIVM greatest at ~ T7  01/18/21: decreased mobility in L to R dicection through the cervical spine. Decreased PIVM in PA direction near the T/L junction.  01/25/21: decreased fascial mobility along B lateral and anterior thighs and through R>L abdomen.  02/01/21: decreased fascial mobility through the R lateral abdomen along T10-12 nerve distribution.  02/05/21: decreased fascial mobility through lower half of back with greatest at R iliac crest posteriorly.  6/23: Pt. Demonstrates compensatory motion to achieve "v-sit" stretch due to adductor tightness.  **following treatment reports much less tension and demonstrates greater hip ABD ROM.  TODAY: Pt. Unable to comfortably lay LLE flat in supine due to L HS spasm and R iliacus spasm  **Pt. Reports some dull pain but able to tolerate supine following treatment.  Strength/MMT:  LE MMT:   Pelvic Floor External Exam: Introitus Appears: elevated Skin integrity: dry Palpation: TTP to all externally except R IC Cough: minimal motion Prolapse visible?: no Scar mobility: restriction noted at posterior fourchette.  Internal Vaginal Exam: Strength (PERF):  Symmetry: Palpation: TTP to anterior PR/PC on R (others not assessed d/t time) Prolapse:   Internal Rectal Exam: Strength (PERF): Symmetry: Palpation: Prolapse:   Abdominal:  Pt. Was using breathing paradoxically with  mini-march exercise and not engaging the TA.  **able to demonstrate with appropriate coordination following review  Palpation: 12-14-20: TTP to R supra and infraspinatus and B multifidus at T12-L1 and L5 bilaterally.   01/11/21: TTP to B Iliacus, sartorius and TFL.  01/16/21: TTP to multifidus B at ~T7  01/18/21: TTP to L>R cervical extensors and sub-occipitals.   01/23/21: highly sensitive to T8-L2 region with poor tolerance to medium pressure near paraspinals and multifidus.  **following treatment Pt. Able to tolerate medium pressure with far less  sensitivity.  01/25/21: TTP to R diaphragm and superficial abdominal fascia/muscles.  02/01/21: TTP to multifidus and erector spinae near T1-12 with radiation of Sx along the lateral ribcage.  02/05/21: TTP to multifidus and erector spinae near T1-12 on R>L and R>L QL  02/22/21: TTP to B upper trapezius and sacral multifidus.  03/01/21:TTP to R>L QL and lumbar extensors with radiating pain into R hip anteriorly and posteriorly.  03/08/21: TTP to B adductors.  03/15/21: TTP to B iliacus, psoas, lumbar and thoracic multifidus worst near T/L and L/S junctions.  03/22/21: Highly TTP to B adductors.  Gait Analysis:  INTERVENTIONS THIS SESSION: Manual: Performed TP release and STM to L HS, R Iliacus, and B multifidus at the T/L and L/S junctions followed by MET correction x3 for L anterior innominate rotation to decrease spasm and pain and allow for improved balance of musculature for improved function and decreased symptoms.  Dry-needle: Performed TPDN with a .30x23mm needle and standard approach as described below to decrease spasm and pain and allow for improved balance of musculature for improved function and decreased symptoms.  Self-care: Explained the likelihood that she partially tore a HS muscle on the LLE due to being pre-disposed with the L anterior innominate rotation and  high resting tension/stress making it easy to tear with a smaller amount of force. Recommended not stretching and using self-cupping to help improve blood flow for faster healing. Discussed that her gut cleanse is likely partially to blame for the increased systemic effects around the same time and that it is normal and will ease off.  Total time: 60 min.                       Trigger Point Dry Needling - 03/29/21 0001     Consent Given? Yes    Education Handout Provided No    Muscles Treated Back/Hip Lumbar multifidi;Thoracic multifidi;Iliacus    Dry Needling Comments R iliacus, B multifidus.     Other Dry Needling T/L and L/S junctions    Iliacus Response Twitch response elicited;Palpable increased muscle length    Lumbar multifidi Response Twitch response elicited;Palpable increased muscle length    Thoracic multifidi response Twitch response elicited;Palpable increased muscle length                    PT Short Term Goals - 03/15/21 0825       PT SHORT TERM GOAL #1   Title Patient will demonstrate coordinated diaphragmatic breathing with pelvic tilts to demonstrate improved control of diaphragm and TA, to allow for further strengthening of core musculature and decreased pelvic floor spasm.    Baseline poor coordination of deep-core (3 finger diastasis)    Time 6    Period Weeks    Status Achieved    Target Date 12/21/20      PT SHORT TERM GOAL #2   Title Patient will demonstrate improved sitting and standing posture to demonstrate learning and decrease stress on the pelvic floor with functional activity.    Baseline hyperlordotic/anterior pelvic tilt.    Time 6    Period Weeks    Status Achieved    Target Date 12/21/20      PT SHORT TERM GOAL #3   Title Patient will demonstrate HEP x1 in the clinic to demonstrate understanding and proper form to allow for further improvement.    Baseline Pt. lacks knowledge of which therapeutic exercises will decrease her Sx.    Time 6    Period Weeks    Status Achieved    Target Date 12/21/20               PT Long Term Goals - 03/15/21 0826       PT LONG TERM GOAL #1   Title Patient will describe pain no greater than 1/10 during pelvic exam/intercourse and walking/standing for ~ 1 hr. to demonstrate improved functional ability.    Baseline has LBP with worsens when on her feet for extended time, has been in a boot for ~ 5 months    Time 12    Period Weeks    Status On-going    Target Date 06/07/21      PT LONG TERM GOAL #2   Title Patient will report no episodes of UUI over the course of the prior two weeks to  demonstrate improved functional ability.    Baseline She is having UUI daily    Time 12    Period Weeks    Status On-going    Target Date 06/07/21      PT LONG TERM GOAL #3   Title Pt. will improve in FOTO score by 10 points in each category to demonstrate improved function.    Baseline  FOTO PFDI Prolapse: 42, Urinary Problem: 46, PFDI Urinary: 42    Time 12    Period Weeks    Status On-going    Target Date 06/07/21                   Plan - 03/30/21 0739     Clinical Impression Statement Pt. Responded well to all interventions today, demonstrating improved pelvic alignment, decreased LBP and spasm, as well as understanding and correct performance of all education and exercises provided today. They will continue to benefit from skilled physical therapy to work toward remaining goals and maximize function as well as decrease likelihood of symptom increase or recurrence.     PT Next Visit Plan review HEP and streamline, Assess foot mechanics and begin ROM/strengthening. Review and expand/practice walking mechanics. continue to gently release diaphragm and fascia surrounding hips, inner thighs, abdomen, and back. re-assess internally, review posture in walking and standing.    PT Home Exercise Plan self c-section release, seated posture, seated pelvic tilts, diaphragmatic breathing,posture in walking and standing, back-relax, 3-way wall stretch, progressive relaxation technique, low back stretch, scapular retractions and chin-tucks, mini-marches, Happy-baby/child's pose, standing quad stretch, Prone press ups, MCAD info, longitudinal chest stretch on foam roller, self fascial release, MET for L anterior rotation, mini-march with leg extensions, side-lying hip ABD.    Consulted and Agree with Plan of Care Patient             Patient will benefit from skilled therapeutic intervention in order to improve the following deficits and impairments:     Visit Diagnosis: Sacrococcygeal  disorders, not elsewhere classified  Abnormal posture  Other muscle spasm     Problem List Patient Active Problem List   Diagnosis Date Noted   Gestational diabetes 06/02/2020   Arthritis 10/11/2019   DDD (degenerative disc disease), lumbar 10/11/2019   Hyperlipidemia 10/11/2019   Idiopathic urticaria 10/11/2019   Recurrent major depressive disorder, in partial remission ([place]) 10/11/2019   Spinal stenosis of lumbar region without neurogenic claudication 10/11/2019   Type 2 diabetes mellitus without complication, with long-term current use of insulin ([place]) 10/11/2019   Elevated lactic acid level 09/29/2017   Leukocytosis 09/29/2017   Diabetes ([place]) 09/28/2017   Hypertension 09/28/2017   Mild asthma without complication 80/99/8338   Morbid obesity ([place]) 09/28/2017   Thalassemia 09/28/2017   GAD (generalized anxiety disorder) 07/25/2014   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 [place], [place], [address] Phone: [phone]   Fax:  [phone]  Name: Eileen Chaney MRN: [id number] Date of Birth: Jun 06, 1976

## 2021-04-05 ENCOUNTER — Ambulatory Visit: Payer: BC Managed Care – PPO

## 2021-04-05 ENCOUNTER — Other Ambulatory Visit: Payer: Self-pay

## 2021-04-05 DIAGNOSIS — M533 Sacrococcygeal disorders, not elsewhere classified: Secondary | ICD-10-CM | POA: Insufficient documentation

## 2021-04-05 DIAGNOSIS — M62838 Other muscle spasm: Secondary | ICD-10-CM

## 2021-04-05 DIAGNOSIS — R293 Abnormal posture: Secondary | ICD-10-CM | POA: Insufficient documentation

## 2021-04-05 NOTE — Therapy (Signed)
[place] MAIN [facility] SERVICES [address] [place], [place], [address] Phone: [phone]   Fax:  [phone]  Physical Therapy Treatment  The patient has been informed of current processes in place at Outpatient Rehab to protect patients from Covid-19 exposure including social distancing, schedule modifications, and new cleaning procedures. After discussing their particular risk with a therapist based on the patient's personal risk factors, the patient has decided to proceed with in-person therapy.   Patient Details  Name: Eileen Chaney MRN: [id number] Date of Birth: April 23, 1976 No data recorded  Encounter Date: 04/05/2021   PT End of Session - 04/05/21 1310     Visit Number 23    Number of Visits 34    Date for PT Re-Evaluation 04/12/21    Authorization Type BCBS    Authorization Time Period 03/15/21 through 05/08/21    Authorization - Visit Number 3    Authorization - Number of Visits 12    Progress Note Due on Visit 30    PT Start Time 6962    PT Stop Time 0835    PT Time Calculation (min) 60 min    Activity Tolerance Patient tolerated treatment well;No increased pain    Behavior During Therapy [facility] for tasks assessed/performed             No past medical history on file.  Past Surgical History:  Procedure Laterality Date   ABDOMINAL HYSTERECTOMY     uterine fibroids 2018    There were no vitals filed for this visit.  Pelvic Floor Physical Therapy Treatment Note  SCREENING  Changes in medications, allergies, or medical history?: She has gone up to 4.5 on her LDN as of Friday.   SUBJECTIVE  Patient reports: The middle part of her back feels a lot better today than it has, she has had a hard time with the die-off process but started to feel like it is getting a little better as of yesterday. The upper torso and B upper thighs, especially the back part are hurting today.   Precautions:  Has an inflammatory  arthritis, Abdominal Hysterectomy, HTN, Diabetes, asthma, Lumbar DDD, Idiopathic Urticaria, GAD, HLD, Leukocytosis, Morbid Obesity, Depression, Spinal stenosis of lumbar region  Sexual activity/pain: Was having pain with both initial penetration and deeper thrusting. Not currently sexually active.   Location of pain: LBP/hips (shoulder/torso) Current pain:  5/10 (0/10) [5/10 with motion in either] Max pain:  7/10 (7/10) Least pain:  0/10 (0/10) Nature of pain: stiffness (sharp, achy)  ** Pt. Reports feeling "definitely better" following treatment  Patient Goals: To not feel tied to a bathroom and be able to drink adequate water and exercise.   OBJECTIVE  Changes in: Posture/Observations:  ASIS and ankles appear nearly level in supine, further investigation needed.  12-14-20: hyperlordotic, PSIS appear level in standing  01/02/21:  hyperlordotic, PSIS appear level in standing  02/22/21: L PSIS high, LLE long in supine, short in sitting  03/29/32: L PSIS high, R knee slightly bent. L long in lying, short in sitting (L anterior Rotation)   **following treatment PSIS appear level in standing, ASIS and ankles level in supine.   Range of Motion/Flexibilty:  12/21/20: Decreased fascial mobility through entire back with greatest restriction at B lateral waist folds and midline in lumbar region.  01/11/21: decreased fascial mobility and pain with MFR near R glute med. Region.  01/16/21: decreased spinal mobility and increased sensitivity with PIVM greatest at ~ T7  01/18/21: decreased mobility  in L to R dicection through the cervical spine. Decreased PIVM in PA direction near the T/L junction.  01/25/21: decreased fascial mobility along B lateral and anterior thighs and through R>L abdomen.  02/01/21: decreased fascial mobility through the R lateral abdomen along T10-12 nerve distribution.  02/05/21: decreased fascial mobility through lower half of back with greatest at R iliac crest  posteriorly.  6/23: Pt. Demonstrates compensatory motion to achieve "v-sit" stretch due to adductor tightness.  **following treatment reports much less tension and demonstrates greater hip ABD ROM.  03/29/21: Pt. Unable to comfortably lay LLE flat in supine due to L HS spasm and R iliacus spasm  **Pt. Reports some dull pain but able to tolerate supine following treatment.  Strength/MMT:  LE MMT:   Pelvic Floor External Exam: Introitus Appears: elevated Skin integrity: dry Palpation: TTP to all externally except R IC Cough: minimal motion Prolapse visible?: no Scar mobility: restriction noted at posterior fourchette.  Internal Vaginal Exam: Strength (PERF):  Symmetry: Palpation: TTP to anterior PR/PC on R (others not assessed d/t time) Prolapse:   Internal Rectal Exam: Strength (PERF): Symmetry: Palpation: Prolapse:   Abdominal:  Pt. Was using breathing paradoxically with  mini-march exercise and not engaging the TA.  **able to demonstrate with appropriate coordination following review  Palpation: 12-14-20: TTP to R supra and infraspinatus and B multifidus at T12-L1 and L5 bilaterally.   01/11/21: TTP to B Iliacus, sartorius and TFL.  01/16/21: TTP to multifidus B at ~T7  01/18/21: TTP to L>R cervical extensors and sub-occipitals.   01/23/21: highly sensitive to T8-L2 region with poor tolerance to medium pressure near paraspinals and multifidus.  **following treatment Pt. Able to tolerate medium pressure with far less sensitivity.  01/25/21: TTP to R diaphragm and superficial abdominal fascia/muscles.  02/01/21: TTP to multifidus and erector spinae near T1-12 with radiation of Sx along the lateral ribcage.  02/05/21: TTP to multifidus and erector spinae near T1-12 on R>L and R>L QL  02/22/21: TTP to B upper trapezius and sacral multifidus.  03/01/21:TTP to R>L QL and lumbar extensors with radiating pain into R hip anteriorly and posteriorly.  03/08/21: TTP to B  adductors.  03/15/21: TTP to B iliacus, psoas, lumbar and thoracic multifidus worst near T/L and L/S junctions.  03/22/21: Highly TTP to B adductors.  03/29/21: TTP to L HS, R Iliacus, and B multifidus at the T/L and L/S junctions  04/05/21: Highly TTP to B adductors, VMO, and rectus femoris.  Gait Analysis:  INTERVENTIONS THIS SESSION: Manual: Performed TP release to B adductors, VMO, and rectus femoris along with MFR using static cupping to B anterior and medial thighs to decrease spasm and pain, improve fascial mobility, and allow for improved balance of musculature for improved function and decreased symptoms.   Total time: 60 min.                                PT Short Term Goals - 03/15/21 0825       PT SHORT TERM GOAL #1   Title Patient will demonstrate coordinated diaphragmatic breathing with pelvic tilts to demonstrate improved control of diaphragm and TA, to allow for further strengthening of core musculature and decreased pelvic floor spasm.    Baseline poor coordination of deep-core (3 finger diastasis)    Time 6    Period Weeks    Status Achieved    Target Date 12/21/20  PT SHORT TERM GOAL #2   Title Patient will demonstrate improved sitting and standing posture to demonstrate learning and decrease stress on the pelvic floor with functional activity.    Baseline hyperlordotic/anterior pelvic tilt.    Time 6    Period Weeks    Status Achieved    Target Date 12/21/20      PT SHORT TERM GOAL #3   Title Patient will demonstrate HEP x1 in the clinic to demonstrate understanding and proper form to allow for further improvement.    Baseline Pt. lacks knowledge of which therapeutic exercises will decrease her Sx.    Time 6    Period Weeks    Status Achieved    Target Date 12/21/20               PT Long Term Goals - 03/15/21 0826       PT LONG TERM GOAL #1   Title Patient will describe pain no greater than 1/10 during pelvic  exam/intercourse and walking/standing for ~ 1 hr. to demonstrate improved functional ability.    Baseline has LBP with worsens when on her feet for extended time, has been in a boot for ~ 5 months    Time 12    Period Weeks    Status On-going    Target Date 06/07/21      PT LONG TERM GOAL #2   Title Patient will report no episodes of UUI over the course of the prior two weeks to demonstrate improved functional ability.    Baseline She is having UUI daily    Time 12    Period Weeks    Status On-going    Target Date 06/07/21      PT LONG TERM GOAL #3   Title Pt. will improve in FOTO score by 10 points in each category to demonstrate improved function.    Baseline FOTO PFDI Prolapse: 42, Urinary Problem: 46, PFDI Urinary: 42    Time 12    Period Weeks    Status On-going    Target Date 06/07/21                   Plan - 04/05/21 1311     Clinical Impression Statement Pt. Responded well to all interventions today, demonstrating improved fascial mobility, decreased spasm and pain, as well as understanding and correct performance of all education and exercises provided today. They will continue to benefit from skilled physical therapy to work toward remaining goals and maximize function as well as decrease likelihood of symptom increase or recurrence.     PT Next Visit Plan re-assess PFM and release internally if tolerated. TPDN to adductors if TTP remains. Review HEP and streamline, Assess foot mechanics and begin ROM/strengthening. Review and expand/practice walking mechanics. continue to gently release diaphragm and fascia surrounding hips, inner thighs, abdomen, and back. re-assess internally, review posture in walking and standing.    PT Home Exercise Plan self c-section release, seated posture, seated pelvic tilts, diaphragmatic breathing,posture in walking and standing, back-relax, 3-way wall stretch, progressive relaxation technique, low back stretch, scapular retractions and  chin-tucks, mini-marches, Happy-baby/child's pose, standing quad stretch, Prone press ups, MCAD info, longitudinal chest stretch on foam roller, self fascial release, MET for L anterior rotation, mini-march with leg extensions, side-lying hip ABD.    Consulted and Agree with Plan of Care Patient             Patient will benefit from skilled therapeutic intervention in order to improve  the following deficits and impairments:     Visit Diagnosis: Sacrococcygeal disorders, not elsewhere classified  Abnormal posture  Other muscle spasm     Problem List Patient Active Problem List   Diagnosis Date Noted   Gestational diabetes 06/02/2020   Arthritis 10/11/2019   DDD (degenerative disc disease), lumbar 10/11/2019   Hyperlipidemia 10/11/2019   Idiopathic urticaria 10/11/2019   Recurrent major depressive disorder, in partial remission ([place]) 10/11/2019   Spinal stenosis of lumbar region without neurogenic claudication 10/11/2019   Type 2 diabetes mellitus without complication, with long-term current use of insulin ([place]) 10/11/2019   Elevated lactic acid level 09/29/2017   Leukocytosis 09/29/2017   Diabetes ([place]) 09/28/2017   Hypertension 09/28/2017   Mild asthma without complication 00/97/9499   Morbid obesity ([place]) 09/28/2017   Thalassemia 09/28/2017   GAD (generalized anxiety disorder) 07/25/2014   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

## 2021-04-12 ENCOUNTER — Ambulatory Visit: Payer: BC Managed Care – PPO

## 2021-04-12 ENCOUNTER — Encounter: Payer: Self-pay | Admitting: Dietician

## 2021-04-12 ENCOUNTER — Other Ambulatory Visit: Payer: Self-pay

## 2021-04-12 DIAGNOSIS — M533 Sacrococcygeal disorders, not elsewhere classified: Secondary | ICD-10-CM

## 2021-04-12 DIAGNOSIS — R293 Abnormal posture: Secondary | ICD-10-CM

## 2021-04-12 DIAGNOSIS — M62838 Other muscle spasm: Secondary | ICD-10-CM

## 2021-04-12 NOTE — Therapy (Signed)
[place] [facility] SERVICES [address] [place], [place], [address] Phone: [phone]   Fax:  [phone]  Physical Therapy Treatment  The patient has been informed of current processes in place at [facility]patient Rehab to protect patients from Covid-19 exposure including social distancing, schedule modifications, and new cleaning procedures. After discussing their particular risk with a therapist based on the patient's personal risk factors, the patient has decided to proceed with in-person therapy.  Patient Details  Name: Eileen Chaney MRN: [id number] Date of Birth: Nov 02, 1975 No data recorded  Encounter Date: 04/12/2021   PT End of Session - 04/12/21 1228     Visit Number 24    Number of Visits 34    Date for PT Re-Evaluation 04/12/21    Authorization Type [employer]    Authorization Time Period 03/15/21 through 05/08/21    Authorization - Visit Number 4    Authorization - Number of Visits 12    Progress Note Due on Visit 30    PT Start Time 1324    PT Stop Time 0835    PT Time Calculation (min) 60 min    Activity Tolerance Patient tolerated treatment well;No increased pain    Behavior During Therapy [facility] for tasks assessed/performed             No past medical history on file.  Past Surgical History:  Procedure Laterality Date   ABDOMINAL HYSTERECTOMY     uterine fibroids 2018    There were no vitals filed for this visit.  Pelvic Floor Physical Therapy Treatment Note  SCREENING  Changes in medications, allergies, or medical history?: She has gone up to 4.5 on her LDN as of Friday.   SUBJECTIVE  Patient reports: She is doing "OK" the lower back is worse than the upper part but she has taken ibuprofen and a suppository this morning. She feels her back the most when trying to shower and get dressed because of the bending. She has kept stretching the inner thighs but it is still feeling better. She had a body scan (zytoscan)  yesterday which showed T6-7 and s2-3  Precautions:  Has an inflammatory arthritis, Abdominal Hysterectomy, HTN, Diabetes, asthma, Lumbar DDD, Idiopathic Urticaria, GAD, HLD, Leukocytosis, Morbid Obesity, Depression, Spinal stenosis of lumbar region  Sexual activity/pain: Was having pain with both initial penetration and deeper thrusting. Not currently sexually active.   Location of pain: LBP/hips (shoulder/torso) Current pain:  4/10 (0/10) [3/10 with motion in either] Max pain:  5/10 5/10) Least pain:  0/10 (0/10) Nature of pain: stiffness (sharp, achy)  ** Pt. Reports no increased pain from treatment, less LBP following treatment.  Patient Goals: To not feel tied to a bathroom and be able to drink adequate water and exercise.   OBJECTIVE  Changes in: Posture/Observations:  ASIS and ankles appear nearly level in supine, further investigation needed.  12-14-20: hyperlordotic, PSIS appear level in standing  01/02/21:  hyperlordotic, PSIS appear level in standing  02/22/21: L PSIS high, LLE long in supine, short in sitting  03/29/32: L PSIS high, R knee slightly bent. L long in lying, short in sitting (L anterior Rotation)   **following treatment PSIS appear level in standing, ASIS and ankles level in supine.   Range of Motion/Flexibilty:  12/21/20: Decreased fascial mobility through entire back with greatest restriction at B lateral waist folds and midline in lumbar region.  01/11/21: decreased fascial mobility and pain with MFR near R glute med. Region.  01/16/21: decreased  spinal mobility and increased sensitivity with PIVM greatest at ~ T7  01/18/21: decreased mobility in L to R dicection through the cervical spine. Decreased PIVM in PA direction near the T/L junction.  01/25/21: decreased fascial mobility along B lateral and anterior thighs and through R>L abdomen.  02/01/21: decreased fascial mobility through the R lateral abdomen along T10-12 nerve distribution.  02/05/21:  decreased fascial mobility through lower half of back with greatest at R iliac crest posteriorly.  6/23: Pt. Demonstrates compensatory motion to achieve "v-sit" stretch due to adductor tightness.  **following treatment reports much less tension and demonstrates greater hip ABD ROM.  03/29/21: Pt. Unable to comfortably lay LLE flat in supine due to L HS spasm and R iliacus spasm  **Pt. Reports some dull pain but able to tolerate supine following treatment.  Strength/MMT:  LE MMT:   Pelvic Floor External Exam: Introitus Appears: elevated Skin integrity: dry Palpation: TTP to all externally except R IC Cough: minimal motion Prolapse visible?: no Scar mobility: restriction noted at posterior fourchette.  Internal Vaginal Exam: Strength (PERF):  Symmetry: Palpation: TTP to anterior PR/PC on R (others not assessed d/t time) Prolapse:  TODAY: TTP to posterior fourchette and B PR/PC posteriorly with decreased fascial mobility.   Abdominal:  Pt. Was using breathing paradoxically with  mini-march exercise and not engaging the TA.  **able to demonstrate with appropriate coordination following review  Palpation: 12-14-20: TTP to R supra and infraspinatus and B multifidus at T12-L1 and L5 bilaterally.   01/11/21: TTP to B Iliacus, sartorius and TFL.  01/16/21: TTP to multifidus B at ~T7  01/18/21: TTP to L>R cervical extensors and sub-occipitals.   01/23/21: highly sensitive to T8-L2 region with poor tolerance to medium pressure near paraspinals and multifidus.  **following treatment Pt. Able to tolerate medium pressure with far less sensitivity.  01/25/21: TTP to R diaphragm and superficial abdominal fascia/muscles.  02/01/21: TTP to multifidus and erector spinae near T1-12 with radiation of Sx along the lateral ribcage.  02/05/21: TTP to multifidus and erector spinae near T1-12 on R>L and R>L QL  02/22/21: TTP to B upper trapezius and sacral multifidus.  03/01/21:TTP to R>L QL and  lumbar extensors with radiating pain into R hip anteriorly and posteriorly.  03/08/21: TTP to B adductors.  03/15/21: TTP to B iliacus, psoas, lumbar and thoracic multifidus worst near T/L and L/S junctions.  03/22/21: Highly TTP to B adductors.  03/29/21: TTP to L HS, R Iliacus, and B multifidus at the T/L and L/S junctions  04/05/21: Highly TTP to B adductors, VMO, and rectus femoris.  Gait Analysis:  INTERVENTIONS THIS SESSION: Manual: Performed TP and scar release to posterior fourchette and B posterior PR/PC to decrease spasm and pain, improve fascial mobility, and allow for improved balance of musculature for improved function and decreased symptoms.  Therex: Reviewed importance of child's pose or Happy baby as well as an adductor stretch with emphasis on belly breathing and lengthening of the PFM To maintain and improve muscle length and allow for improved balance of musculature for long-term symptom relief.   Total time: 60 min.                              PT Short Term Goals - 03/15/21 0825       PT SHORT TERM GOAL #1   Title Patient will demonstrate coordinated diaphragmatic breathing with pelvic tilts to demonstrate improved control of diaphragm and  TA, to allow for further strengthening of core musculature and decreased pelvic floor spasm.    Baseline poor coordination of deep-core (3 finger diastasis)    Time 6    Period Weeks    Status Achieved    Target Date 12/21/20      PT SHORT TERM GOAL #2   Title Patient will demonstrate improved sitting and standing posture to demonstrate learning and decrease stress on the pelvic floor with functional activity.    Baseline hyperlordotic/anterior pelvic tilt.    Time 6    Period Weeks    Status Achieved    Target Date 12/21/20      PT SHORT TERM GOAL #3   Title Patient will demonstrate HEP x1 in the clinic to demonstrate understanding and proper form to allow for further improvement.    Baseline Pt.  lacks knowledge of which therapeutic exercises will decrease her Sx.    Time 6    Period Weeks    Status Achieved    Target Date 12/21/20               PT Long Term Goals - 03/15/21 0826       PT LONG TERM GOAL #1   Title Patient will describe pain no greater than 1/10 during pelvic exam/intercourse and walking/standing for ~ 1 hr. to demonstrate improved functional ability.    Baseline has LBP with worsens when on her feet for extended time, has been in a boot for ~ 5 months    Time 12    Period Weeks    Status On-going    Target Date 06/07/21      PT LONG TERM GOAL #2   Title Patient will report no episodes of UUI over the course of the prior two weeks to demonstrate improved functional ability.    Baseline She is having UUI daily    Time 12    Period Weeks    Status On-going    Target Date 06/07/21      PT LONG TERM GOAL #3   Title Pt. will improve in FOTO score by 10 points in each category to demonstrate improved function.    Baseline FOTO PFDI Prolapse: 42, Urinary Problem: 46, PFDI Urinary: 42    Time 12    Period Weeks    Status On-going    Target Date 06/07/21                   Plan - 04/12/21 1228     Clinical Impression Statement Pt. Responded well to all interventions today, demonstrating decreased sensitivity of vaginal tissue near the introitus as well as understanding and correct performance of all education and exercises provided today. They will continue to benefit from skilled physical therapy to work toward remaining goals and maximize function as well as decrease likelihood of symptom increase or recurrence.     PT Next Visit Plan re-assess PFM and perform further release internally if tolerated, ask about coconut suppository use. TPDN to adductors if TTP remains. Review HEP and streamline, Assess foot mechanics and begin ROM/strengthening. Review and expand/practice walking mechanics. continue to gently release diaphragm and fascia  surrounding hips, inner thighs, abdomen, and back. re-assess internally, review posture in walking and standing.    PT Home Exercise Plan self c-section release, seated posture, seated pelvic tilts, diaphragmatic breathing,posture in walking and standing, back-relax, 3-way wall stretch, progressive relaxation technique, low back stretch, scapular retractions and chin-tucks, mini-marches, Happy-baby/child's pose, standing quad stretch, Prone  press ups, MCAD info, longitudinal chest stretch on foam roller, self fascial release, MET for L anterior rotation, mini-march with leg extensions, side-lying hip ABD.    Consulted and Agree with Plan of Care Patient             Patient will benefit from skilled therapeutic intervention in order to improve the following deficits and impairments:     Visit Diagnosis: Sacrococcygeal disorders, not elsewhere classified  Abnormal posture  Other muscle spasm     Problem List Patient Active Problem List   Diagnosis Date Noted   Gestational diabetes 06/02/2020   Arthritis 10/11/2019   DDD (degenerative disc disease), lumbar 10/11/2019   Hyperlipidemia 10/11/2019   Idiopathic urticaria 10/11/2019   Recurrent major depressive disorder, in partial remission ([place]) 10/11/2019   Spinal stenosis of lumbar region without neurogenic claudication 10/11/2019   Type 2 diabetes mellitus without complication, with long-term current use of insulin ([place]) 10/11/2019   Elevated lactic acid level 09/29/2017   Leukocytosis 09/29/2017   Diabetes ([place]) 09/28/2017   Hypertension 09/28/2017   Mild asthma without complication 04/59/1368   Morbid obesity ([place]) 09/28/2017   Thalassemia 09/28/2017   GAD (generalized anxiety disorder) 07/25/2014   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

## 2021-04-12 NOTE — Progress Notes (Signed)
Have not heard back from patient to reschedule her cancelled appointment from 03/02/21. Sent notification to referring provider.

## 2021-04-19 ENCOUNTER — Other Ambulatory Visit: Payer: Self-pay

## 2021-04-19 ENCOUNTER — Ambulatory Visit: Payer: BC Managed Care – PPO

## 2021-04-19 DIAGNOSIS — M533 Sacrococcygeal disorders, not elsewhere classified: Secondary | ICD-10-CM | POA: Diagnosis not present

## 2021-04-19 DIAGNOSIS — M62838 Other muscle spasm: Secondary | ICD-10-CM

## 2021-04-19 DIAGNOSIS — R293 Abnormal posture: Secondary | ICD-10-CM

## 2021-04-19 NOTE — Therapy (Addendum)
[place] [facility] SERVICES [address] [place], [place], [address] Phone: [phone]   Fax:  [phone]  Physical Therapy Treatment  The patient has been informed of current processes in place at Outpatient Rehab to protect patients from Covid-19 exposure including social distancing, schedule modifications, and new cleaning procedures. After discussing their particular risk with a therapist based on the patient's personal risk factors, the patient has decided to proceed with in-person therapy.  Patient Details  Name: Eileen Chaney MRN: [id number] Date of Birth: Jun 23, 1976 No data recorded  Encounter Date: 04/19/2021   PT End of Session - 04/19/21 0853     Visit Number 25    Number of Visits 34    Date for PT Re-Evaluation 04/12/21    Authorization Type [employer]    Authorization Time Period 03/15/21 through 05/08/21    Authorization - Visit Number 5    Authorization - Number of Visits 12    Progress Note Due on Visit 30    PT Start Time 0735    PT Stop Time 0845    PT Time Calculation (min) 70 min    Activity Tolerance Patient tolerated treatment well;No increased pain    Behavior During Therapy [facility] for tasks assessed/performed             No past medical history on file.  Past Surgical History:  Procedure Laterality Date   ABDOMINAL HYSTERECTOMY     uterine fibroids 2018    There were no vitals filed for this visit.  Pelvic Floor Physical Therapy Treatment Note  SCREENING  Changes in medications, allergies, or medical history?: none   SUBJECTIVE  Patient reports: She had a massage after her last session and it was very painful, her legs have been pretty bad but her pain is better this morning than it has been. It has been pretty rough all week. She has not used a valium today because they make her very tired and she cant today. The most pain today is in the low bottom and thighs B but she also has been having B  shoulder pain when trying to sleep that feels better if she brings her top arm behind her back when in side-lying. She has been feeling "period cramps" this week regardless of not having a uterus. She has had pain in the back with descending stairs that switches to the posterior thighs when she tries to engage the core like we discussed at last session.  Precautions:  Has an inflammatory arthritis, Abdominal Hysterectomy, HTN, Diabetes, asthma, Lumbar DDD, Idiopathic Urticaria, GAD, HLD, Leukocytosis, Morbid Obesity, Depression, Spinal stenosis of lumbar region  Sexual activity/pain: Was having pain with both initial penetration and deeper thrusting. Not currently sexually active.   Location of pain: LBP/hips (shoulder/torso) Current pain:  4/10 (0/10) [3/10 with motion in either] Max pain:  5/10 5/10) Least pain:  0/10 (0/10) Nature of pain: stiffness (sharp, achy)  ** Pt. Demonstrated improved ability to don her pants and descend stairs without increased pain following treatment. Some increased soreness from manual treatment.  Patient Goals: To not feel tied to a bathroom and be able to drink adequate water and exercise.   OBJECTIVE  Changes in: Posture/Observations:  ASIS and ankles appear nearly level in supine, further investigation needed.  12-14-20: hyperlordotic, PSIS appear level in standing  01/02/21:  hyperlordotic, PSIS appear level in standing  02/22/21: L PSIS high, LLE long in supine, short in sitting  03/29/32: L PSIS high,  R knee slightly bent. L long in lying, short in sitting (L anterior Rotation)   **following treatment PSIS appear level in standing, ASIS and ankles level in supine.   Range of Motion/Flexibilty:  12/21/20: Decreased fascial mobility through entire back with greatest restriction at B lateral waist folds and midline in lumbar region.  01/11/21: decreased fascial mobility and pain with MFR near R glute med. Region.  01/16/21: decreased spinal  mobility and increased sensitivity with PIVM greatest at ~ T7  01/18/21: decreased mobility in L to R dicection through the cervical spine. Decreased PIVM in PA direction near the T/L junction.  01/25/21: decreased fascial mobility along B lateral and anterior thighs and through R>L abdomen.  02/01/21: decreased fascial mobility through the R lateral abdomen along T10-12 nerve distribution.  02/05/21: decreased fascial mobility through lower half of back with greatest at R iliac crest posteriorly.  6/23: Pt. Demonstrates compensatory motion to achieve "v-sit" stretch due to adductor tightness.  **following treatment reports much less tension and demonstrates greater hip ABD ROM.  03/29/21: Pt. Unable to comfortably lay LLE flat in supine due to L HS spasm and R iliacus spasm  **Pt. Reports some dull pain but able to tolerate supine following treatment.  Strength/MMT:  LE MMT:   Pelvic Floor External Exam: Introitus Appears: elevated Skin integrity: dry Palpation: TTP to all externally except R IC Cough: minimal motion Prolapse visible?: no Scar mobility: restriction noted at posterior fourchette.  Internal Vaginal Exam: Strength (PERF):  Symmetry: Palpation: TTP to anterior PR/PC on R (others not assessed d/t time) Prolapse:  04/12/21: TTP to posterior fourchette and B PR/PC posteriorly with decreased fascial mobility.   Abdominal:  Pt. Was using breathing paradoxically with  mini-march exercise and not engaging the TA.  **able to demonstrate with appropriate coordination following review (from  previous session)  Palpation: 12-14-20: TTP to R supra and infraspinatus and B multifidus at T12-L1 and L5 bilaterally.   01/11/21: TTP to B Iliacus, sartorius and TFL.  01/16/21: TTP to multifidus B at ~T7  01/18/21: TTP to L>R cervical extensors and sub-occipitals.   01/23/21: highly sensitive to T8-L2 region with poor tolerance to medium pressure near paraspinals and multifidus.   **following treatment Pt. Able to tolerate medium pressure with far less sensitivity.  01/25/21: TTP to R diaphragm and superficial abdominal fascia/muscles.  02/01/21: TTP to multifidus and erector spinae near T1-12 with radiation of Sx along the lateral ribcage.  02/05/21: TTP to multifidus and erector spinae near T1-12 on R>L and R>L QL  02/22/21: TTP to B upper trapezius and sacral multifidus.  03/01/21:TTP to R>L QL and lumbar extensors with radiating pain into R hip anteriorly and posteriorly.  03/08/21: TTP to B adductors.  03/15/21: TTP to B iliacus, psoas, lumbar and thoracic multifidus worst near T/L and L/S junctions.  03/22/21: Highly TTP to B adductors.  03/29/21: TTP to L HS, R Iliacus, and B multifidus at the T/L and L/S junctions  04/05/21: Highly TTP to B adductors, VMO, and rectus femoris.  TODAY: TTP to B multifidus at C4/5 and from L3-S1B cervical semispinalis, and to L semimembranosus   Gait Analysis:  INTERVENTIONS THIS SESSION: Manual: Performed TP release and STM to B multifidus at C4/5 and from L3-S1, B cervical semispinalis, and to L semimembranosus to decrease spasm and pain and allow for improved balance of musculature for improved function and decreased symptoms.  Theract: Educated on use of cervical traction as a tool to decrease nausea and dizziness  caused by cervical tension. Reviewed the importance of chin-tucks as a tool to help improve cervical stability to prevent Sx. Long-term.  Dry-needle: Performed TPDN with a .30x75 (lumbar), .30x30 (cervical and .30x124m (hamstring) needle and standard approach as described below to decrease spasm and pain and allow for improved balance of musculature for improved function and decreased symptoms.  E-stim: Used sensory-level E-stim in combination with TPND through the L3-S1 region with two ipsilateral channels and a frequency of 100 to help decrease spasm and neurological sensitivity through the region to help decrease  pain and cramping in down-stream tissues.     Total time: 60 min.                       Trigger Point Dry Needling - 04/19/21 0001     Consent Given? Yes    Education Handout Provided No    Muscles Treated Head and Neck Cervical multifidi;Semispinalis capitus    Muscles Treated Lower Quadrant Hamstring    Muscles Treated Back/Hip Lumbar multifidi    Semispinalis capitus Response Twitch reponse elicited;Palpable increased muscle length    Cervical multifidi Response Twitch reponse elicited;Palpable increased muscle length    Hamstring Response Twitch response elicited;Palpable increased muscle length    Lumbar multifidi Response Twitch response elicited;Palpable increased muscle length                    PT Short Term Goals - 03/15/21 0825       PT SHORT TERM GOAL #1   Title Patient will demonstrate coordinated diaphragmatic breathing with pelvic tilts to demonstrate improved control of diaphragm and TA, to allow for further strengthening of core musculature and decreased pelvic floor spasm.    Baseline poor coordination of deep-core (3 finger diastasis)    Time 6    Period Weeks    Status Achieved    Target Date 12/21/20      PT SHORT TERM GOAL #2   Title Patient will demonstrate improved sitting and standing posture to demonstrate learning and decrease stress on the pelvic floor with functional activity.    Baseline hyperlordotic/anterior pelvic tilt.    Time 6    Period Weeks    Status Achieved    Target Date 12/21/20      PT SHORT TERM GOAL #3   Title Patient will demonstrate HEP x1 in the clinic to demonstrate understanding and proper form to allow for further improvement.    Baseline Pt. lacks knowledge of which therapeutic exercises will decrease her Sx.    Time 6    Period Weeks    Status Achieved    Target Date 12/21/20               PT Long Term Goals - 03/15/21 0826       PT LONG TERM GOAL #1   Title Patient will  describe pain no greater than 1/10 during pelvic exam/intercourse and walking/standing for ~ 1 hr. to demonstrate improved functional ability.    Baseline has LBP with worsens when on her feet for extended time, has been in a boot for ~ 5 months    Time 12    Period Weeks    Status On-going    Target Date 06/07/21      PT LONG TERM GOAL #2   Title Patient will report no episodes of UUI over the course of the prior two weeks to demonstrate improved functional ability.    Baseline She is  having UUI daily    Time 12    Period Weeks    Status On-going    Target Date 06/07/21      PT LONG TERM GOAL #3   Title Pt. will improve in FOTO score by 10 points in each category to demonstrate improved function.    Baseline FOTO PFDI Prolapse: 42, Urinary Problem: 46, PFDI Urinary: 42    Time 12    Period Weeks    Status On-going    Target Date 06/07/21                   Plan - 04/19/21 0854     Clinical Impression Statement Pt. Responded well to all interventions today, demonstrating decreased spasm and TTP and improved ability to dress and descend stairs without increased pain, as well as understanding and correct performance of all education and exercises provided today. They will continue to benefit from skilled physical therapy to work toward remaining goals and maximize function as well as decrease likelihood of symptom increase or recurrence.    PT Next Visit Plan re-assess PFM and perform further release internally if tolerated, ask about coconut suppository use. TPDN to adductors if TTP remains. Review HEP and streamline, Assess foot mechanics and begin ROM/strengthening. Review and expand/practice walking mechanics. continue to gently release diaphragm and fascia surrounding hips, inner thighs, abdomen, and back. re-assess internally, review posture in walking and standing.    PT Home Exercise Plan self c-section release, seated posture, seated pelvic tilts, diaphragmatic  breathing,posture in walking and standing, back-relax, 3-way wall stretch, progressive relaxation technique, low back stretch, scapular retractions and chin-tucks, mini-marches, Happy-baby/child's pose, standing quad stretch, Prone press ups, MCAD info, longitudinal chest stretch on foam roller, self fascial release, MET for L anterior rotation, mini-march with leg extensions, side-lying hip ABD.    Consulted and Agree with Plan of Care Patient             Patient will benefit from skilled therapeutic intervention in order to improve the following deficits and impairments:     Visit Diagnosis: Sacrococcygeal disorders, not elsewhere classified  Abnormal posture  Other muscle spasm     Problem List Patient Active Problem List   Diagnosis Date Noted   Gestational diabetes 06/02/2020   Arthritis 10/11/2019   DDD (degenerative disc disease), lumbar 10/11/2019   Hyperlipidemia 10/11/2019   Idiopathic urticaria 10/11/2019   Recurrent major depressive disorder, in partial remission ([place]) 10/11/2019   Spinal stenosis of lumbar region without neurogenic claudication 10/11/2019   Type 2 diabetes mellitus without complication, with long-term current use of insulin ([place]) 10/11/2019   Elevated lactic acid level 09/29/2017   Leukocytosis 09/29/2017   Diabetes ([place]) 09/28/2017   Hypertension 09/28/2017   Mild asthma without complication 44/96/7591   Morbid obesity ([place]) 09/28/2017   Thalassemia 09/28/2017   GAD (generalized anxiety disorder) 07/25/2014   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

## 2021-04-26 ENCOUNTER — Ambulatory Visit: Payer: BC Managed Care – PPO

## 2021-04-26 ENCOUNTER — Other Ambulatory Visit: Payer: Self-pay

## 2021-04-26 DIAGNOSIS — M533 Sacrococcygeal disorders, not elsewhere classified: Secondary | ICD-10-CM

## 2021-04-26 DIAGNOSIS — M62838 Other muscle spasm: Secondary | ICD-10-CM

## 2021-04-26 DIAGNOSIS — R293 Abnormal posture: Secondary | ICD-10-CM

## 2021-04-26 NOTE — Therapy (Signed)
[place] MAIN [facility] SERVICES [address] [place], [place], [address] Phone: [phone]   Fax:  [phone]  Physical Therapy Treatment and Discharge Summary  The patient has been informed of current processes in place at Outpatient Rehab to protect patients from Covid-19 exposure including social distancing, schedule modifications, and new cleaning procedures. After discussing their particular risk with a therapist based on the patient's personal risk factors, the patient has decided to proceed with in-person therapy.   Patient Details  Name: EDELIN FRYER MRN: [id number] Date of Birth: December 20, 1975 No data recorded  Encounter Date: 04/26/2021   PT End of Session - 04/26/21 1302     Visit Number 26    Number of Visits 34    Date for PT Re-Evaluation 04/12/21    Authorization Type [employer]    Authorization Time Period 03/15/21 through 05/08/21    Authorization - Visit Number 6    Authorization - Number of Visits 12    Progress Note Due on Visit 30    PT Start Time 0736    PT Stop Time 0836    PT Time Calculation (min) 60 min    Activity Tolerance Patient tolerated treatment well;No increased pain    Behavior During Therapy [facility] for tasks assessed/performed             No past medical history on file.  Past Surgical History:  Procedure Laterality Date   ABDOMINAL HYSTERECTOMY     uterine fibroids 2018    There were no vitals filed for this visit.  Pelvic Floor Physical Therapy Treatment Note  SCREENING  Changes in medications, allergies, or medical history?: none   SUBJECTIVE  Patient reports: She woke up from pain at 5 am. She had a hard time going to sleep, she was not having a lot of pain but had just could not get to sleep until ~ 2:30 am. m  Precautions:  Has an inflammatory arthritis, Abdominal Hysterectomy, HTN, Diabetes, asthma, Lumbar DDD, Idiopathic Urticaria, GAD, HLD, Leukocytosis, Morbid Obesity,  Depression, Spinal stenosis of lumbar region  Sexual activity/pain: Was having pain with both initial penetration and deeper thrusting. Not currently sexually active.   Location of pain: LBP/hips (shoulder/torso) Current pain:  4/10 (0/10) [3/10 with motion in either] Max pain:  5/10 5/10) Least pain:  0/10 (0/10) Nature of pain: stiffness (sharp, achy)  ** Pt. Reports feeling "definitely better" following treatment.  Patient Goals: To not feel tied to a bathroom and be able to drink adequate water and exercise.   OBJECTIVE  Changes in: Posture/Observations:  ASIS and ankles appear nearly level in supine, further investigation needed.  12-14-20: hyperlordotic, PSIS appear level in standing  01/02/21:  hyperlordotic, PSIS appear level in standing  02/22/21: L PSIS high, LLE long in supine, short in sitting  03/29/32: L PSIS high, R knee slightly bent. L long in lying, short in sitting (L anterior Rotation)   **following treatment PSIS appear level in standing, ASIS and ankles level in supine.   Range of Motion/Flexibilty:  12/21/20: Decreased fascial mobility through entire back with greatest restriction at B lateral waist folds and midline in lumbar region.  01/11/21: decreased fascial mobility and pain with MFR near R glute med. Region.  01/16/21: decreased spinal mobility and increased sensitivity with PIVM greatest at ~ T7  01/18/21: decreased mobility in L to R dicection through the cervical spine. Decreased PIVM in PA direction near the T/L junction.  01/25/21: decreased fascial mobility along  B lateral and anterior thighs and through R>L abdomen.  02/01/21: decreased fascial mobility through the R lateral abdomen along T10-12 nerve distribution.  02/05/21: decreased fascial mobility through lower half of back with greatest at R iliac crest posteriorly.  6/23: Pt. Demonstrates compensatory motion to achieve "v-sit" stretch due to adductor tightness.  **following treatment  reports much less tension and demonstrates greater hip ABD ROM.  03/29/21: Pt. Unable to comfortably lay LLE flat in supine due to L HS spasm and R iliacus spasm  **Pt. Reports some dull pain but able to tolerate supine following treatment.  Strength/MMT:  LE MMT:   Pelvic Floor External Exam: Introitus Appears: elevated Skin integrity: dry Palpation: TTP to all externally except R IC Cough: minimal motion Prolapse visible?: no Scar mobility: restriction noted at posterior fourchette.  Internal Vaginal Exam: Strength (PERF):  Symmetry: Palpation: TTP to anterior PR/PC on R (others not assessed d/t time) Prolapse:  04/12/21: TTP to posterior fourchette and B PR/PC posteriorly with decreased fascial mobility.   Abdominal:  Pt. Was using breathing paradoxically with  mini-march exercise and not engaging the TA.  **able to demonstrate with appropriate coordination following review (from  previous session)  Palpation: 12-14-20: TTP to R supra and infraspinatus and B multifidus at T12-L1 and L5 bilaterally.   01/11/21: TTP to B Iliacus, sartorius and TFL.  01/16/21: TTP to multifidus B at ~T7  01/18/21: TTP to L>R cervical extensors and sub-occipitals.   01/23/21: highly sensitive to T8-L2 region with poor tolerance to medium pressure near paraspinals and multifidus.  **following treatment Pt. Able to tolerate medium pressure with far less sensitivity.  01/25/21: TTP to R diaphragm and superficial abdominal fascia/muscles.  02/01/21: TTP to multifidus and erector spinae near T1-12 with radiation of Sx along the lateral ribcage.  02/05/21: TTP to multifidus and erector spinae near T1-12 on R>L and R>L QL  02/22/21: TTP to B upper trapezius and sacral multifidus.  03/01/21:TTP to R>L QL and lumbar extensors with radiating pain into R hip anteriorly and posteriorly.  03/08/21: TTP to B adductors.  03/15/21: TTP to B iliacus, psoas, lumbar and thoracic multifidus worst near T/L and L/S  junctions.  03/22/21: Highly TTP to B adductors.  03/29/21: TTP to L HS, R Iliacus, and B multifidus at the T/L and L/S junctions  04/05/21: Highly TTP to B adductors, VMO, and rectus femoris.  04/19/21: TTP to B multifidus at C4/5 and from L3-S1B cervical semispinalis, and to L semimembranosus   Gait Analysis:  INTERVENTIONS THIS SESSION: Manual: Performed MFR using static cupping across the lumbar spine into the lateral abdomen near from T12-L3 region as well as performed cupping to the posterior calf and TP release to the R gastroc, fibularis and FDL to decrease fascial tension along the deep back fascia chain to allow for decreased back pain.   Self-care: Reviewed and expanded on how her inflammation, lumbar disc issues, stress and posture all are interwoven in their ability to cause her increased pain on a given day and how to use this knowledge to help her self-manage pain by recognizing which one she can treat to help decrease the others. Educated on how the lymphatic system is also integral in this equation when you are dealing with inflammatory issues and how she can use self lymphatic massage to buy her some band-width regarding pain.  Total time: 60 min.  PT Short Term Goals - 04/26/21 1303       PT SHORT TERM GOAL #1   Title Patient will demonstrate coordinated diaphragmatic breathing with pelvic tilts to demonstrate improved control of diaphragm and TA, to allow for further strengthening of core musculature and decreased pelvic floor spasm.    Baseline poor coordination of deep-core (3 finger diastasis)    Time 6    Period Weeks    Status Achieved    Target Date 12/21/20      PT SHORT TERM GOAL #2   Title Patient will demonstrate improved sitting and standing posture to demonstrate learning and decrease stress on the pelvic floor with functional activity.    Baseline hyperlordotic/anterior pelvic tilt.    Time 6     Period Weeks    Status Achieved    Target Date 12/21/20      PT SHORT TERM GOAL #3   Title Patient will demonstrate HEP x1 in the clinic to demonstrate understanding and proper form to allow for further improvement.    Baseline Pt. lacks knowledge of which therapeutic exercises will decrease her Sx.    Time 6    Period Weeks    Status Achieved    Target Date 12/21/20               PT Long Term Goals - 04/26/21 1303       PT LONG TERM GOAL #1   Title Patient will describe pain no greater than 1/10 during pelvic exam/intercourse and walking/standing for ~ 1 hr. to demonstrate improved functional ability.    Baseline has LBP with worsens when on her feet for extended time, has been in a boot for ~ 5 months    Time 12    Period Weeks    Status Not Met    Target Date 06/07/21      PT LONG TERM GOAL #2   Title Patient will report no episodes of UUI over the course of the prior two weeks to demonstrate improved functional ability.    Baseline She is having UUI daily    Time 12    Period Weeks    Status Not Met    Target Date 06/07/21      PT LONG TERM GOAL #3   Title Pt. will improve in FOTO score by 10 points in each category to demonstrate improved function.    Baseline FOTO PFDI Prolapse: 42, Urinary Problem: 46, PFDI Urinary: 42    Time 12    Period Weeks    Status Not Met    Target Date 06/07/21                   Plan - 04/26/21 1302     Clinical Impression Statement Pt. is making slow but steady progress and has relapsing and remitting Sx. which fluctuate in correlation with increased stress and inflammation periods. She responded well to all intervenetions today, demonstrating decreased spasm and improved fascial mobility as well as understanding of how all of the individual issues she is dealing with compound on one another to lead to increased periods of pain. We will D/C at this time due to Pt. preference but she will contiue to benefit from skilled  pelvic health PT to address the remaining goals and help Pt. learn more about how she can self-manage to facilitate long-term health and wellbeing.    Personal Factors and Comorbidities Comorbidity 3+    Comorbidities Abdominal Hysterectomy, HTN, Diabetes, asthma, Lumbar  DDD, Idiopathic Urticaria, GAD, HLD, Leukocytosis, Morbid Obesity, Depression, Spinal stenosis of lumbar region    Examination-Activity Limitations Toileting;Continence    Examination-Participation Restrictions Interpersonal Relationship;Occupation;Community Activity    Stability/Clinical Decision Making Unstable/Unpredictable    Clinical Decision Making High    Rehab Potential Good    PT Treatment/Interventions ADLs/Self Care Home Management;Biofeedback;Electrical Stimulation;Moist Heat;Ultrasound;Therapeutic activities;Functional mobility training;Gait training;Therapeutic exercise;Neuromuscular re-education;Patient/family education;Manual techniques;Scar mobilization;Dry needling;Passive range of motion;Taping;Spinal Manipulations;Joint Manipulations    PT Next Visit Plan D/C    PT Home Exercise Plan self c-section release, seated posture, seated pelvic tilts, diaphragmatic breathing,posture in walking and standing, back-relax, 3-way wall stretch, progressive relaxation technique, low back stretch, scapular retractions and chin-tucks, mini-marches, Happy-baby/child's pose, standing quad stretch, Prone press ups, MCAD info, longitudinal chest stretch on foam roller, self fascial release, MET for L anterior rotation, mini-march with leg extensions, side-lying hip ABD.    Consulted and Agree with Plan of Care Patient             Patient will benefit from skilled therapeutic intervention in order to improve the following deficits and impairments:  Decreased activity tolerance, Increased fascial restricitons, Improper body mechanics, Pain, Obesity, Postural dysfunction, Increased muscle spasms, Decreased scar mobility, Decreased  coordination  Visit Diagnosis: Sacrococcygeal disorders, not elsewhere classified  Abnormal posture  Other muscle spasm     Problem List Patient Active Problem List   Diagnosis Date Noted   Gestational diabetes 06/02/2020   Arthritis 10/11/2019   DDD (degenerative disc disease), lumbar 10/11/2019   Hyperlipidemia 10/11/2019   Idiopathic urticaria 10/11/2019   Recurrent major depressive disorder, in partial remission ([place]) 10/11/2019   Spinal stenosis of lumbar region without neurogenic claudication 10/11/2019   Type 2 diabetes mellitus without complication, with long-term current use of insulin ([place]) 10/11/2019   Elevated lactic acid level 09/29/2017   Leukocytosis 09/29/2017   Diabetes ([place]) 09/28/2017   Hypertension 09/28/2017   Mild asthma without complication 28/78/6767   Morbid obesity ([place]) 09/28/2017   Thalassemia 09/28/2017   GAD (generalized anxiety disorder) 07/25/2014   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

## 2021-07-04 ENCOUNTER — Ambulatory Visit: Payer: Self-pay

## 2021-07-05 ENCOUNTER — Other Ambulatory Visit: Payer: Self-pay

## 2021-07-05 ENCOUNTER — Ambulatory Visit: Payer: BC Managed Care – PPO | Admitting: Nurse Practitioner

## 2021-07-05 ENCOUNTER — Encounter: Payer: Self-pay | Admitting: Nurse Practitioner

## 2021-07-05 VITALS — BP 122/76 | HR 88 | Temp 98.4°F | Resp 16

## 2021-07-05 DIAGNOSIS — J069 Acute upper respiratory infection, unspecified: Secondary | ICD-10-CM

## 2021-07-05 LAB — POC COVID19 BINAXNOW: SARS Coronavirus 2 Ag: NEGATIVE

## 2021-07-05 NOTE — Progress Notes (Signed)
   Subjective:    Patient ID: Eileen Chaney, female    DOB: 12/19/1975, 45 y.o.   MRN: [id number]  Pt presents with c/o Covid contact.  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 Pt developed congestion, body aches and fatigue 2 days ago.  Pt is vaccinated and boosted for Covid 19.    Today's Vitals   07/05/21 1505  BP: 122/76  Pulse: 88  Resp: 16  Temp: 98.4 F (36.9 C)  TempSrc: Tympanic  SpO2: 99%   There is no height or weight on file to calculate BMI.    Review of Systems  Constitutional:  Positive for fatigue.  HENT:  Positive for congestion.   Eyes: Negative.   Respiratory: Negative.    Cardiovascular: Negative.   Musculoskeletal:  Positive for myalgias.      Objective:   Physical Exam Constitutional:      Appearance: Normal appearance.  HENT:     Right Ear: Tympanic membrane normal.     Left Ear: Tympanic membrane normal.     Nose: Nose normal.     Mouth/Throat:     Mouth: Mucous membranes are moist.  Cardiovascular:     Rate and Rhythm: Normal rate and regular rhythm.  Pulmonary:     Effort: Pulmonary effort is normal.     Breath sounds: Normal breath sounds.  Skin:    General: Skin is warm.  Neurological:     Mental Status: She is alert.    Recent Results (from the past 2160 hour(s))  POC COVID-19     Status: Normal   Collection Time: 07/05/21  3:32 PM  Result Value Ref Range   SARS Coronavirus 2 Ag Negative Negative         Assessment & Plan:   1. Viral upper respiratory tract infection - POC COVID-19 - Novel Coronavirus, NAA ([facility])   Will follow up with PCR.  Advised to stay out of work until results return.  Manage sxs with otc medications.  Discussed immune support.

## 2021-07-06 LAB — NOVEL CORONAVIRUS, NAA: SARS-CoV-2, NAA: NOT DETECTED

## 2021-07-06 LAB — SARS-COV-2, NAA 2 DAY TAT

## 2021-07-09 ENCOUNTER — Other Ambulatory Visit: Payer: Self-pay

## 2021-07-09 ENCOUNTER — Ambulatory Visit: Payer: BC Managed Care – PPO | Admitting: Medical

## 2021-07-09 DIAGNOSIS — M791 Myalgia, unspecified site: Secondary | ICD-10-CM

## 2021-07-09 DIAGNOSIS — R051 Acute cough: Secondary | ICD-10-CM

## 2021-07-09 DIAGNOSIS — R509 Fever, unspecified: Secondary | ICD-10-CM

## 2021-07-09 DIAGNOSIS — H9203 Otalgia, bilateral: Secondary | ICD-10-CM

## 2021-07-09 MED ORDER — AMOXICILLIN 875 MG PO TABS
875.0000 mg | ORAL_TABLET | Freq: Two times a day (BID) | ORAL | 0 refills | Status: AC
Start: 2021-07-09 — End: 2021-07-19

## 2021-07-09 NOTE — Patient Instructions (Signed)
Bland Diet A bland diet consists of foods that are often soft and do not have a lot of fat, fiber, or extra seasonings. Foods without fat, fiber, or seasoning are easier for the body to digest. They are also less likely to irritate your mouth, throat, stomach, and other parts of your digestive system. A bland diet is sometimes called a BRAT diet. What is my plan? Your health care provider or food and nutrition specialist (dietitian) may recommend specific changes to your diet to prevent symptoms or to treat your symptoms. These changes may include: Eating small meals often. Cooking food until it is soft enough to chew easily. Chewing your food well. Drinking fluids slowly. Not eating foods that are very spicy, sour, or fatty. Not eating citrus fruits, such as oranges and grapefruit. What do I need to know about this diet? Eat a variety of foods from the bland diet food list. Do not follow a bland diet longer than needed. Ask your health care provider whether you should take vitamins or supplements. What foods can I eat? Grains Hot cereals, such as cream of wheat. Rice. Bread, crackers, or tortillas made from refined white flour. Vegetables Canned or cooked vegetables. Mashed or boiled potatoes. Fruits Bananas. Applesauce. Other types of cooked or canned fruit with the skin and seeds removed, such as canned peaches or pears. Meats and other proteins Scrambled eggs. Creamy peanut butter or other nut butters. Lean, well-cooked meats, such as chicken or fish. Tofu. Soups or broths. Dairy Low-fat dairy products, such as milk, cottage cheese, or yogurt. Beverages Water. Herbal tea. Apple juice. Fats and oils Mild salad dressings. Canola or olive oil. Sweets and desserts Pudding. Custard. Fruit gelatin. Ice cream. The items listed above may not be a complete list of recommended foods and beverages. Contact a dietitian for more options. What foods are not recommended? Grains Whole grain  breads and cereals. Vegetables Raw vegetables. Fruits Raw fruits, especially citrus, berries, or dried fruits. Dairy Whole fat dairy foods. Beverages Caffeinated drinks. Alcohol. Seasonings and condiments Strongly flavored seasonings or condiments. Hot sauce. Salsa. Other foods Spicy foods. Fried foods. Sour foods, such as pickled or fermented foods. Foods with high sugar content. Foods high in fiber. The items listed above may not be a complete list of foods and beverages to avoid. Contact a dietitian for more information. Summary A bland diet consists of foods that are often soft and do not have a lot of fat, fiber, or extra seasonings. Foods without fat, fiber, or seasoning are easier for the body to digest. Check with your health care provider to see how long you should follow this diet plan. It is not meant to be followed for long periods. This information is not intended to replace advice given to you by your health care provider. Make sure you discuss any questions you have with your health care provider. Document Revised: 10/15/2017 Document Reviewed: 10/15/2017 Elsevier Patient Education  2022 [place]. Otitis Media, Adult Otitis media is a condition in which the middle ear is red and swollen (inflamed) and full of fluid. The middle ear is the part of the ear that contains bones for hearing as well as air that helps send sounds to the brain. The condition usually goes away on its own. What are the causes? This condition is caused by a blockage in the eustachian tube. This tube connects the middle ear to the back of the nose. It normally allows air into the middle ear. The blockage is  caused by fluid or swelling. Problems that can cause blockage include: A cold or infection that affects the nose, mouth, or throat. Allergies. An irritant, such as tobacco smoke. Adenoids that have become large. The adenoids are soft tissue located in the back of the throat, behind the nose and  the roof of the mouth. Growth or swelling in the upper part of the throat, just behind the nose (nasopharynx). Damage to the ear caused by a change in pressure. This is called barotrauma. What increases the risk? You are more likely to develop this condition if you: Smoke or are exposed to tobacco smoke. Have an opening in the roof of your mouth (cleft palate). Have acid reflux. Have problems in your body's defense system (immune system). What are the signs or symptoms? Symptoms of this condition include: Ear pain. Fever. Problems with hearing. Being tired. Fluid leaking from the ear. Ringing in the ear. How is this treated? This condition can go away on its own within 3-5 days. But if the condition is caused by germs (bacteria) and does not go away on its own, or if it keeps coming back, your doctor may: Give you antibiotic medicines. Give you medicines for pain. Follow these instructions at home: Take over-the-counter and prescription medicines only as told by your doctor. If you were prescribed an antibiotic medicine, take it as told by your doctor. Do not stop taking it even if you start to feel better. Keep all follow-up visits. Contact a doctor if: You have bleeding from your nose. There is a lump on your neck. You are not feeling better in 5 days. You feel worse instead of better. Get help right away if: You have pain that is not helped with medicine. You have swelling, redness, or pain around your ear. You get a stiff neck. You cannot move part of your face (paralysis). You notice that the bone behind your ear hurts when you touch it. You get a very bad headache. Summary Otitis media means that the middle ear is red, swollen, and full of fluid. This condition usually goes away on its own. If the problem does not go away, treatment may be needed. You may be given medicines to treat the infection or to treat your pain. If you were prescribed an antibiotic medicine, take  it as told by your doctor. Do not stop taking it even if you start to feel better. Keep all follow-up visits. This information is not intended to replace advice given to you by your health care provider. Make sure you discuss any questions you have with your health care provider. Document Revised: 12/25/2020 Document Reviewed: 12/25/2020 Elsevier Patient Education  2022 [place]: What to Do if You Are Sick If you test positive and are an older adult or someone who is at high risk of getting very sick from COVID-19, treatment may be available. Contact a healthcare provider right away after a positive test to determine if you are eligible, even if your symptoms are mild right now. You can also visit a Test to Treat location and, if eligible, receive a prescription from a provider. Don't delay: Treatment must be started within the first few days to be effective. If you have a fever, cough, or other symptoms, you might have COVID-19. Most people have mild illness and are able to recover at home. If you are sick: Keep track of your symptoms. If you have an emergency warning sign (including trouble breathing), call 911. Steps to help prevent  the spread of COVID-19 if you are sick If you are sick with COVID-19 or think you might have COVID-19, follow the steps below to care for yourself and to help protect other people in your home and community. Stay home except to get medical care Stay home. Most people with COVID-19 have mild illness and can recover at home without medical care. Do not leave your home, except to get medical care. Do not visit public areas and do not go to places where you are unable to wear a mask. Take care of yourself. Get rest and stay hydrated. Take over-the-counter medicines, such as acetaminophen, to help you feel better. Stay in touch with your doctor. Call before you get medical care. Be sure to get care if you have trouble breathing, or have any other emergency  warning signs, or if you think it is an emergency. Avoid public transportation, ride-sharing, or taxis if possible. Get tested If you have symptoms of COVID-19, get tested. While waiting for test results, stay away from others, including staying apart from those living in your household. Get tested as soon as possible after your symptoms start. Treatments may be available for people with COVID-19 who are at risk for becoming very sick. Don't delay: Treatment must be started early to be effective--some treatments must begin within 5 days of your first symptoms. Contact your healthcare provider right away if your test result is positive to determine if you are eligible. Self-tests are one of several options for testing for the virus that causes COVID-19 and may be more convenient than laboratory-based tests and point-of-care tests. Ask your healthcare provider or your local health department if you need help interpreting your test results. You can visit your state, tribal, local, and territorial health department's website to look for the latest local information on testing sites. Separate yourself from other people As much as possible, stay in a specific room and away from other people and pets in your home. If possible, you should use a separate bathroom. If you need to be around other people or animals in or outside of the home, wear a well-fitting mask. Tell your close contacts that they may have been exposed to COVID-19. An infected person can spread COVID-19 starting 48 hours (or 2 days) before the person has any symptoms or tests positive. By letting your close contacts know they may have been exposed to COVID-19, you are helping to protect everyone. See COVID-19 and Animals if you have questions about pets. If you are diagnosed with COVID-19, someone from the health department may call you. Answer the call to slow the spread. Monitor your symptoms Symptoms of COVID-19 include fever, cough, or  other symptoms. Follow care instructions from your healthcare provider and local health department. Your local health authorities may give instructions on checking your symptoms and reporting information. When to seek emergency medical attention Look for emergency warning signs* for COVID-19. If someone is showing any of these signs, seek emergency medical care immediately: Trouble breathing Persistent pain or pressure in the chest New confusion Inability to wake or stay awake Pale, gray, or blue-colored skin, lips, or nail beds, depending on skin tone *This list is not all possible symptoms. Please call your medical provider for any other symptoms that are severe or concerning to you. Call 911 or call ahead to your local emergency facility: Notify the operator that you are seeking care for someone who has or may have COVID-19. Call ahead before visiting your doctor Call ahead.  Many medical visits for routine care are being postponed or done by phone or telemedicine. If you have a medical appointment that cannot be postponed, call your doctor's office, and tell them you have or may have COVID-19. This will help the office protect themselves and other patients. If you are sick, wear a well-fitting mask You should wear a mask if you must be around other people or animals, including pets (even at home). Wear a mask with the best fit, protection, and comfort for you. You don't need to wear the mask if you are alone. If you can't put on a mask (because of trouble breathing, for example), cover your coughs and sneezes in some other way. Try to stay at least 6 feet away from other people. This will help protect the people around you. Masks should not be placed on young children under age 2 years, anyone who has trouble breathing, or anyone who is not able to remove the mask without help. Cover your coughs and sneezes Cover your mouth and nose with a tissue when you cough or sneeze. Throw away used  tissues in a lined trash can. Immediately wash your hands with soap and water for at least 20 seconds. If soap and water are not available, clean your hands with an alcohol-based hand sanitizer that contains at least 60% alcohol. Clean your hands often Wash your hands often with soap and water for at least 20 seconds. This is especially important after blowing your nose, coughing, or sneezing; going to the bathroom; and before eating or preparing food. Use hand sanitizer if soap and water are not available. Use an alcohol-based hand sanitizer with at least 60% alcohol, covering all surfaces of your hands and rubbing them together until they feel dry. Soap and water are the best option, especially if hands are visibly dirty. Avoid touching your eyes, nose, and mouth with unwashed hands. Handwashing Tips Avoid sharing personal household items Do not share dishes, drinking glasses, cups, eating utensils, towels, or bedding with other people in your home. Wash these items thoroughly after using them with soap and water or put in the dishwasher. Clean surfaces in your home regularly Clean and disinfect high-touch surfaces (for example, doorknobs, tables, handles, light switches, and countertops) in your "sick room" and bathroom. In shared spaces, you should clean and disinfect surfaces and items after each use by the person who is ill. If you are sick and cannot clean, a caregiver or other person should only clean and disinfect the area around you (such as your bedroom and bathroom) on an as needed basis. Your caregiver/other person should wait as long as possible (at least several hours) and wear a mask before entering, cleaning, and disinfecting shared spaces that you use. Clean and disinfect areas that may have blood, stool, or body fluids on them. Use household cleaners and disinfectants. Clean visible dirty surfaces with household cleaners containing soap or detergent. Then, use a household  disinfectant. Use a product from [employer] List N: Disinfectants for Coronavirus (KCLEX-51). Be sure to follow the instructions on the label to ensure safe and effective use of the product. Many products recommend keeping the surface wet with a disinfectant for a certain period of time (look at "contact time" on the product label). You may also need to wear personal protective equipment, such as gloves, depending on the directions on the product label. Immediately after disinfecting, wash your hands with soap and water for 20 seconds. For completed guidance on cleaning and disinfecting  your home, visit Complete Disinfection Guidance. Take steps to improve ventilation at home Improve ventilation (air flow) at home to help prevent from spreading COVID-19 to other people in your household. Clear out COVID-19 virus particles in the air by opening windows, using air filters, and turning on fans in your home. Use this interactive tool to learn how to improve air flow in your home. When you can be around others after being sick with COVID-19 Deciding when you can be around others is different for different situations. Find out when you can safely end home isolation. For any additional questions about your care, contact your healthcare provider or state or local health department. 12/19/2020 Content source: [facility] ([facility]), Division of Viral Diseases This information is not intended to replace advice given to you by your health care provider. Make sure you discuss any questions you have with your health care provider. Document Revised: 02/01/2021 Document Reviewed: 02/01/2021 Elsevier Patient Education  [place].

## 2021-07-09 NOTE — Progress Notes (Signed)
   Subjective:    Patient ID: Eileen Chaney, female    DOB: 01/16/1976, 45 y.o.   MRN: [id number]  HPI 45 yo female in non acute distress consents to telemedicine appointment.Symptoms seemed to be improving until yesterday. She states she still is not feeling well and has had a low grade fever of  100 degrees. She is taking ibuprofen for the fever. Yesterday she had abdominal pain but it has resolved today. Other symptoms include bodyaches, nasal congestion, ear pain bilateral R>L, HA , dizziness and nausea . Her original RTW date was 07/10/21.    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  HENT:  Positive for congestion, ear pain (R>L), postnasal drip and sore throat.   Respiratory:  Positive for cough. Negative for shortness of breath.   Cardiovascular:  Negative for chest pain.  Gastrointestinal:  Positive for abdominal pain (yesterday only) and nausea. Negative for diarrhea.  Neurological:  Positive for dizziness and headaches. Negative for syncope and light-headedness.  Drinking protein shakes. Overall decreased appetitie.    Objective:   Physical Exam [id number]  No acute distress noted on phone call. No physical exam performed due to telemedicine appointment.       Assessment & Plan:  Cough, fever, ear pain  R>L  Meds ordered this encounter  Medications   amoxicillin (AMOXIL) 875 MG tablet    Sig: Take 1 tablet (875 mg total) by mouth 2 (two) times daily for 10 days.    Dispense:  20 tablet    Refill:  0    Myalgias nausea,  dizziness. Bland diet. Will send updated work note to HR, Patient for f/u appointment [facility] on Wednesday 07/11/21.

## 2021-07-11 ENCOUNTER — Other Ambulatory Visit: Payer: Self-pay

## 2021-07-11 ENCOUNTER — Ambulatory Visit: Payer: BC Managed Care – PPO | Admitting: Nurse Practitioner

## 2021-07-11 DIAGNOSIS — J069 Acute upper respiratory infection, unspecified: Secondary | ICD-10-CM

## 2021-07-11 NOTE — Progress Notes (Signed)
See previous notes. Patient was seen for COVID exposure with symptoms. Rapid and PCR negative   Recent Results (from the past 2160 hour(s))  POC COVID-19     Status: Normal   Collection Time: 07/05/21  3:32 PM  Result Value Ref Range   SARS Coronavirus 2 Ag Negative Negative  Novel Coronavirus, NAA ([facility])     Status: None   Collection Time: 07/05/21  3:33 PM   Specimen: Nasopharyngeal(NP) swabs in vial transport medium   Nasopharynge  Previous  Result Value Ref Range   SARS-CoV-2, NAA Not Detected Not Detected    Comment: This nucleic acid amplification test was developed and its performance characteristics determined by [employer]. Nucleic acid amplification tests include RT-PCR and TMA. This test has not been FDA cleared or approved. This test has been authorized by FDA under an Emergency Use Authorization (EUA). This test is only authorized for the duration of time the declaration that circumstances exist justifying the authorization of the emergency use of in vitro diagnostic tests for detection of SARS-CoV-2 virus and/or diagnosis of COVID-19 infection under section 564(b)(1) of the Act, 21 U.S.C. 076KGS-8(P) (1), unless the authorization is terminated or revoked sooner. When diagnostic testing is negative, the possibility of a false negative result should be considered in the context of a patient's recent exposures and the presence of clinical signs and symptoms consistent with COVID-19. An individual without symptoms of COVID-19 and who is not shedding SARS-CoV-2 virus wo uld expect to have a negative (not detected) result in this assay.   SARS-COV-2, NAA 2 DAY TAT     Status: None   Collection Time: 07/05/21  3:33 PM   Nasopharynge  Previous  Result Value Ref Range   SARS-CoV-2, NAA 2 DAY TAT Performed      HPI Phone call follow up- patient consents to telehealth appointment. Is not back on campus yet.  Patient states she still had a Fever yesterday  Has  been using  Ibuprofen   Nausea resolved is resolved as of now  Major complaint is that her Fatigue continues   Assessment and plan: Patient is to remain out of office until fever free for 24 hours.  She will work remote as able.  Advised to return to clinic by Monday if symptoms including fatigue persist to discuss bloodwork for further workup.  She will continue to manage symptoms with over the counter medications, seek medical attention for any acutely worsening symptoms as discussed

## 2021-09-07 ENCOUNTER — Other Ambulatory Visit: Payer: Self-pay

## 2021-09-07 ENCOUNTER — Encounter: Payer: Self-pay | Admitting: Nurse Practitioner

## 2021-09-07 ENCOUNTER — Ambulatory Visit: Payer: BC Managed Care – PPO | Admitting: Nurse Practitioner

## 2021-09-07 VITALS — BP 120/84 | HR 98 | Temp 97.0°F | Resp 18

## 2021-09-07 DIAGNOSIS — Z20822 Contact with and (suspected) exposure to covid-19: Secondary | ICD-10-CM

## 2021-09-07 DIAGNOSIS — U071 COVID-19: Secondary | ICD-10-CM

## 2021-09-07 LAB — POC COVID19 BINAXNOW: SARS Coronavirus 2 Ag: POSITIVE — AB

## 2021-09-07 MED ORDER — ALBUTEROL SULFATE HFA 108 (90 BASE) MCG/ACT IN AERS: INHALATION_SPRAY | RESPIRATORY_TRACT | 1 refills | Status: DC

## 2021-09-07 MED ORDER — FLUTICASONE-SALMETEROL 100-50 MCG/ACT IN AEPB: 1.0000 | INHALATION_SPRAY | Freq: Two times a day (BID) | RESPIRATORY_TRACT | 1 refills | Status: DC

## 2021-09-07 MED ORDER — MOLNUPIRAVIR EUA 200MG CAPSULE: 4.0000 | ORAL_CAPSULE | Freq: Two times a day (BID) | ORAL | 0 refills | Status: AC

## 2021-09-07 NOTE — Progress Notes (Signed)
Subjective:    Patient ID: Eileen Chaney, female    DOB: 06/01/1976, 45 y.o.   MRN: [id number]  HPI  45 year old female presenting to [facility] with complaints of nasal congestion, fever and headache with body aches that have been worsening over the past 2 days. She denies a history of COVID in the past, has had all COVID vaccines including recent booster.   PMH includes reactive airway, HTN, immunocompromised   Today's Vitals   09/07/21 1206  BP: 120/84  Pulse: 98  Resp: 18  Temp: (!) 97 F (36.1 C)  TempSrc: Tympanic  SpO2: 99%   There is no height or weight on file to calculate BMI.   Review of Systems  Constitutional:  Positive for fatigue and fever.  HENT:  Positive for congestion and postnasal drip.   Respiratory:  Positive for cough.   Cardiovascular: Negative.   Genitourinary: Negative.   Musculoskeletal:  Positive for myalgias.  Neurological:  Positive for headaches.    Current Outpatient Medications  Medication Instructions   albuterol (VENTOLIN HFA) 108 (90 Base) MCG/ACT inhaler 1-2 puffs every 4-6 hours as needed for cough or wheezing. Rinse mouth after use   atorvastatin (LIPITOR) 40 MG tablet 1 tablet, Oral, Daily at bedtime   atorvastatin (LIPITOR) 40 mg, Oral, Daily   Baclofen 5 MG TABS baclofen 5 mg tablet   Blood Glucose Monitoring Suppl (CONTOUR NEXT MONITOR) w/Device KIT See admin instructions   cetirizine (ZYRTEC) 10 MG tablet 1 tablet, Oral, Daily   CONTOUR NEXT TEST test strip 1 each, 3 times daily   Dulaglutide (TRULICITY) 1.5 IW/9.7LG SOPN Trulicity 1.5 XQ/1.1 mL subcutaneous pen injector  ADMINISTER 1.5 MG UNDER THE SKIN EVERY 7 DAYS   EPINEPHrine 0.3 mg/0.3 mL IJ SOAJ injection Intramuscular   fluticasone (FLONASE) 50 MCG/ACT nasal spray 1 spray, Each Nare, 2 times daily   fluticasone-salmeterol (ADVAIR) 100-50 MCG/ACT AEPB 1 puff, Inhalation, 2 times daily   Fluticasone-Salmeterol (ADVAIR) 100-50 MCG/DOSE AEPB 1  puff, 2 times daily PRN   gabapentin (NEURONTIN) 300 mg, 3 times daily PRN   hydroxychloroquine (PLAQUENIL) 200 mg, Oral, 2 times daily   ibuprofen (ADVIL) 600 MG tablet Oral   lisinopril (ZESTRIL) 10 MG tablet 1 tablet, Oral, Daily   lisinopril (ZESTRIL) 10 mg, Oral, Daily   metFORMIN (GLUCOPHAGE) 1,000 mg, Oral, 2 times daily   metFORMIN (GLUCOPHAGE-XR) 2,000 mg, Oral, Daily   molnupiravir EUA (LAGEVRIO) 800 mg, Oral, 2 times daily   Multiple Vitamin (MULTI-VITAMIN) tablet 1 tablet, Oral, Daily   sertraline (ZOLOFT) 100 MG tablet Oral   sertraline (ZOLOFT) 200 mg, Oral, Daily   spironolactone (ALDACTONE) 50 mg, Oral, Daily   TURMERIC PO Oral   UNABLE TO FIND Oral, probiotic   UNABLE TO FIND Med Name: Ashwaganda       Objective:   Physical Exam Constitutional:      Appearance: She is ill-appearing.  HENT:     Head: Normocephalic.     Mouth/Throat:     Mouth: Mucous membranes are moist.  Eyes:     Pupils: Pupils are equal, round, and reactive to light.  Cardiovascular:     Rate and Rhythm: Normal rate and regular rhythm.  Pulmonary:     Effort: Pulmonary effort is normal.     Breath sounds: Normal breath sounds.  Musculoskeletal:     Cervical back: Normal range of motion.  Skin:    General: Skin is warm.  Neurological:  Mental Status: She is alert.    Recent Results (from the past 2160 hour(s))  POC COVID-19     Status: Normal   Collection Time: 07/05/21  3:32 PM  Result Value Ref Range   SARS Coronavirus 2 Ag Negative Negative  Novel Coronavirus, NAA ([facility])     Status: None   Collection Time: 07/05/21  3:33 PM   Specimen: Nasopharyngeal(NP) swabs in vial transport medium   Nasopharynge  Previous  Result Value Ref Range   SARS-CoV-2, NAA Not Detected Not Detected    Comment: This nucleic acid amplification test was developed and its performance characteristics determined by [employer]. Nucleic acid amplification tests include RT-PCR and TMA. This  test has not been FDA cleared or approved. This test has been authorized by FDA under an Emergency Use Authorization (EUA). This test is only authorized for the duration of time the declaration that circumstances exist justifying the authorization of the emergency use of in vitro diagnostic tests for detection of SARS-CoV-2 virus and/or diagnosis of COVID-19 infection under section 564(b)(1) of the Act, 21 U.S.C. 048GQB-1(Q) (1), unless the authorization is terminated or revoked sooner. When diagnostic testing is negative, the possibility of a false negative result should be considered in the context of a patient's recent exposures and the presence of clinical signs and symptoms consistent with COVID-19. An individual without symptoms of COVID-19 and who is not shedding SARS-CoV-2 virus wo uld expect to have a negative (not detected) result in this assay.   SARS-COV-2, NAA 2 DAY TAT     Status: None   Collection Time: 07/05/21  3:33 PM   Nasopharynge  Previous  Result Value Ref Range   SARS-CoV-2, NAA 2 DAY TAT Performed   POC COVID-19     Status: Abnormal   Collection Time: 09/07/21 12:09 PM  Result Value Ref Range   SARS Coronavirus 2 Ag Positive (A) Negative    Comment: Patient aware of poc results. Has appt with Marigene Ehlers FNP         Assessment & Plan:  1. Encounter for screening laboratory testing for COVID-19 virus  - POC COVID-19 (positive)   2. COVID-19  - albuterol (VENTOLIN HFA) 108 (90 Base) MCG/ACT inhaler; 1-2 puffs every 4-6 hours as needed for cough or wheezing. Rinse mouth after use  Dispense: 1 each; Refill: 1 - fluticasone-salmeterol (ADVAIR) 100-50 MCG/ACT AEPB; Inhale 1 puff into the lungs 2 (two) times daily.  Dispense: 1 each; Refill: 1 - molnupiravir EUA (LAGEVRIO) 200 mg CAPS capsule; Take 4 capsules (800 mg total) by mouth 2 (two) times daily for 5 days.  Dispense: 40 capsule; Refill: 0    Continue management of symptoms with OTC medications for  cough/congestion and fever as discussed. Follow up with new or worsening symptoms   Take anti-viral with food  Stop with any SE  May return to work 09/10/21 and mask through 09/14/21

## 2021-09-07 NOTE — Patient Instructions (Signed)
You are being prescribed MOLNUPIRAVIR for COVID-19 infection.  ° ° °Please call the pharmacy or go through the drive through vs going inside if you are picking up the mediation yourself to prevent further spread. If prescribed to a  affiliated pharmacy, a pharmacist will bring the medication out to your car. ° ° °ADMINISTRATION INSTRUCTIONS: °Take with or without food. Swallow the tablets whole. Don't chew, crush, or break the medications because it might not work as well ° °For each dose of the medication, you should be taking FOUR tablets at one time, TWICE a day  ° °Finish your full five-day course of Molnupiravir even if you feel better before you're done. Stopping this medication too early can make it less effective to prevent severe illness related to COVID19.   ° °Molnupiravir is prescribed for YOU ONLY. Don't share it with others, even if they have similar symptoms as you. This medication might not be right for everyone.  ° °Make sure to take steps to protect yourself and others while you're taking this medication in order to get well soon and to prevent others from getting sick with COVID-19. ° ° °**If you are of childbearing potential (any gender) - it is advised to not get pregnant while taking this medication and recommended that condoms are used for female partners the next 3 months after taking the medication out of extreme caution  ° ° °COMMON SIDE EFFECTS: °Diarrhea °Nausea  °Dizziness ° ° ° °If your COVID-19 symptoms get worse, get medical help right away. Call 911 if you experience symptoms such as worsening cough, trouble breathing, chest pain that doesn't go away, confusion, a hard time staying awake, and pale or blue-colored skin. °This medication won't prevent all COVID-19 cases from getting worse.  ° ° °

## 2021-09-11 ENCOUNTER — Encounter: Payer: Self-pay | Admitting: Medical

## 2021-09-11 ENCOUNTER — Other Ambulatory Visit: Payer: Self-pay

## 2021-09-11 ENCOUNTER — Ambulatory Visit: Payer: BC Managed Care – PPO | Admitting: Medical

## 2021-09-11 DIAGNOSIS — G9331 Postviral fatigue syndrome: Secondary | ICD-10-CM

## 2021-09-11 DIAGNOSIS — J988 Other specified respiratory disorders: Secondary | ICD-10-CM

## 2021-09-11 DIAGNOSIS — B9689 Other specified bacterial agents as the cause of diseases classified elsewhere: Secondary | ICD-10-CM

## 2021-09-11 DIAGNOSIS — J0110 Acute frontal sinusitis, unspecified: Secondary | ICD-10-CM

## 2021-09-11 MED ORDER — AMOXICILLIN 875 MG PO TABS: 875.0000 mg | ORAL_TABLET | Freq: Two times a day (BID) | ORAL | 0 refills | Status: AC

## 2021-09-11 NOTE — Patient Instructions (Signed)
Amoxicillin Capsules or Tablets What is this medication? AMOXICILLIN (a mox i SIL in) treats infections caused by bacteria. It belongs to a group of medications called penicillin antibiotics. It will not treat colds, the flu, or infections caused by viruses. This medicine may be used for other purposes; ask your health care provider or pharmacist if you have questions. COMMON BRAND NAME(S): Amoxil, Moxilin, Sumox, Trimox What should I tell my care team before I take this medication? They need to know if you have any of these conditions: Kidney disease An unusual or allergic reaction to amoxicillin, other penicillins, cephalosporin antibiotics, other medications, foods, dyes, or preservatives Pregnant or trying to get pregnant Breast-feeding How should I use this medication? Take this medication by mouth with a glass of water. Follow the directions on your prescription label. You can take it with or without food. If it upsets your stomach, take it with food. Take your medication at regular intervals. Do not take your medication more often than directed. Take all of your medication as directed even if you think you are better. Do not skip doses or stop your medication early. Talk to your pediatrician regarding the use of this medication in children. While this medication may be prescribed for selected conditions, precautions do apply. Overdosage: If you think you have taken too much of this medicine contact a poison control center or emergency room at once. NOTE: This medicine is only for you. Do not share this medicine with others. What if I miss a dose? If you miss a dose, take it as soon as you can. If it is almost time for your next dose, take only that dose. Do not take double or extra doses. What may interact with this medication? Allopurinol Birth control pills Certain antibiotics like chloramphenicol, erythromycin, sulfamethoxazole, tetracycline Certain medications that treat or prevent  blood clots like warfarin This list may not describe all possible interactions. Give your health care provider a list of all the medicines, herbs, non-prescription drugs, or dietary supplements you use. Also tell them if you smoke, drink alcohol, or use illegal drugs. Some items may interact with your medicine. What should I watch for while using this medication? Tell your care team if your symptoms do not start to get better or if they get worse. Do not treat diarrhea with over the counter products. Contact your health care professional if you have diarrhea that lasts more than 2 days or if it is severe and watery. If you have diabetes, you may get a false-positive result for sugar in your urine. Check with your health care professional. Birth control may not work properly while you are taking this medication. Talk to your health care professional about using an extra method of birth control. This medication may cause serious skin reactions. They can happen weeks to months after starting the medication. Contact your health care provider right away if you notice fevers or flu-like symptoms with a rash. The rash may be red or purple and then turn into blisters or peeling of the skin. Or, you might notice a red rash with swelling of the face, lips or lymph nodes in your neck or under your arms. What side effects may I notice from receiving this medication? Side effects that you should report to your care team as soon as possible: Allergic reactions--skin rash, itching, hives, swelling of the face, lips, tongue, or throat Redness, blistering, peeling, or loosening of the skin, including inside the mouth Severe diarrhea, fever Unusual vaginal discharge,  itching, or odor Side effects that usually do not require medical attention (report to your care team if they continue or are bothersome): Diarrhea Headache Nausea Vomiting This list may not describe all possible side effects. Call your doctor for  medical advice about side effects. You may report side effects to FDA at 1-800-FDA-1088. Where should I keep my medication? Keep out of the reach of children. Store at room temperature between 20 and 25 degrees C (68 and 77 degrees F). Throw away any unused medication after the expiration date. NOTE: This sheet is a summary. It may not cover all possible information. If you have questions about this medicine, talk to your doctor, pharmacist, or health care provider.  2022 Elsevier/Gold Standard (2020-09-26 00:00:00) Criss Rosales Diet A bland diet consists of foods that are often soft and do not have a lot of fat, fiber, or extra seasonings. Foods without fat, fiber, or seasoning are easier for the body to digest. They are also less likely to irritate your mouth, throat, stomach, and other parts of your digestive system. A bland diet is sometimes called a BRAT diet. What is my plan? Your health care provider or food and nutrition specialist (dietitian) may recommend specific changes to your diet to prevent symptoms or to treat your symptoms. These changes may include: Eating small meals often. Cooking food until it is soft enough to chew easily. Chewing your food well. Drinking fluids slowly. Not eating foods that are very spicy, sour, or fatty. Not eating citrus fruits, such as oranges and grapefruit. What do I need to know about this diet? Eat a variety of foods from the bland diet food list. Do not follow a bland diet longer than needed. Ask your health care provider whether you should take vitamins or supplements. What foods can I eat? Grains Hot cereals, such as cream of wheat. Rice. Bread, crackers, or tortillas made from refined white flour. Vegetables Canned or cooked vegetables. Mashed or boiled potatoes. Fruits Bananas. Applesauce. Other types of cooked or canned fruit with the skin and seeds removed, such as canned peaches or pears. Meats and other proteins Scrambled eggs. Creamy  peanut butter or other nut butters. Lean, well-cooked meats, such as chicken or fish. Tofu. Soups or broths. Dairy Low-fat dairy products, such as milk, cottage cheese, or yogurt. Beverages Water. Herbal tea. Apple juice. Fats and oils Mild salad dressings. Canola or olive oil. Sweets and desserts Pudding. Custard. Fruit gelatin. Ice cream. The items listed above may not be a complete list of recommended foods and beverages. Contact a dietitian for more options. What foods are not recommended? Grains Whole grain breads and cereals. Vegetables Raw vegetables. Fruits Raw fruits, especially citrus, berries, or dried fruits. Dairy Whole fat dairy foods. Beverages Caffeinated drinks. Alcohol. Seasonings and condiments Strongly flavored seasonings or condiments. Hot sauce. Salsa. Other foods Spicy foods. Fried foods. Sour foods, such as pickled or fermented foods. Foods with high sugar content. Foods high in fiber. The items listed above may not be a complete list of foods and beverages to avoid. Contact a dietitian for more information. Summary A bland diet consists of foods that are often soft and do not have a lot of fat, fiber, or extra seasonings. Foods without fat, fiber, or seasoning are easier for the body to digest. Check with your health care provider to see how long you should follow this diet plan. It is not meant to be followed for long periods. This information is not intended to replace advice  given to you by your health care provider. Make sure you discuss any questions you have with your health care provider. Document Revised: 10/15/2017 Document Reviewed: 10/15/2017 Elsevier Patient Education  2022 [employer].

## 2021-09-11 NOTE — Progress Notes (Signed)
   Subjective:    Patient ID: Eileen Chaney, female    DOB: 02/25/1976, 45 y.o.   MRN: [id number]  HPI 45 yo female in non acute distress, consents to telemedicine appointment. She was Diagnosed with a viral infection (Covid-19) and placed on  Molnupiravir EUA on 09/07/2021. Other symptoms include HA, dizziness,  bodyahces and nasal congestion with yellow discharge. The inhalers she was prescribe are helping some ( Albuterol MDI and Advair). Denies SOB or CP. She continues to have a cough,  that is productive yellow , low grade fever 99.  Patient sensitive with medications so she would like an antibiotic that is easy on the stomach.   Review of Systems  Constitutional:  Positive for fatigue. Negative for fever (low grade).  HENT:  Positive for congestion, ear discharge, ear pain and sore throat.   Respiratory:  Positive for cough. Negative for shortness of breath and stridor.   Cardiovascular:  Negative for chest pain.  Gastrointestinal:  Positive for nausea. Negative for diarrhea and vomiting.  Neurological:  Positive for dizziness and headaches.      Objective:   Physical Exam  No acute distress noted on telemedicine appointment. AXOX3 No physical exam was performed due to telemedicine appointment.  Cough noted on phone call.    Assessment & Plan:  Sinusitis Bacterial respiratory infection Continue antiviral medication Meds ordered this encounter  Medications   amoxicillin (AMOXIL) 875 MG tablet    Sig: Take 1 tablet (875 mg total) by mouth 2 (two) times daily for 10 days.    Dispense:  20 tablet    Refill:  0   Patient declines cough medication. Patient can return to work when no fever x 24 hours with no OTC Motrin or Tylenol are being taken and all other symptoms are improving. Patient verbalizes understanding and has no questions at discharge. Work note sent to [employer].

## 2021-10-02 ENCOUNTER — Ambulatory Visit: Payer: BC Managed Care – PPO | Admitting: Medical

## 2021-10-02 ENCOUNTER — Other Ambulatory Visit: Payer: Self-pay

## 2021-10-02 ENCOUNTER — Encounter: Payer: Self-pay | Admitting: Medical

## 2021-10-02 VITALS — BP 122/88 | HR 90 | Temp 97.8°F | Resp 18

## 2021-10-02 DIAGNOSIS — J4521 Mild intermittent asthma with (acute) exacerbation: Secondary | ICD-10-CM

## 2021-10-02 DIAGNOSIS — R051 Acute cough: Secondary | ICD-10-CM

## 2021-10-02 MED ORDER — BENZONATATE 100 MG PO CAPS: ORAL_CAPSULE | ORAL | 0 refills | Status: DC

## 2021-10-02 MED ORDER — MONTELUKAST SODIUM 10 MG PO TABS: 10.0000 mg | ORAL_TABLET | Freq: Every day | ORAL | 3 refills | Status: DC

## 2021-10-02 NOTE — Progress Notes (Signed)
° °  Subjective:    Patient ID: Eileen Chaney, female    DOB: 03-10-1976, 46 y.o.   MRN: [id number]  HPI 46 yo female in non acute distress, history of Covid on 09/07/2021, continues to have cough. Prodictve white, clearing throat a lot. Taking the following medications. Zyrtec daily Advair diskus BID, Albuterol MDI BID,   No history of wheezing. Denies fever or chills.  Review of Systems  Constitutional:  Negative for chills and fever.  HENT:  Positive for sore throat (yesterday better today). Negative for voice change.   Respiratory:  Positive for cough and chest tightness. Negative for shortness of breath and wheezing.   Cardiovascular:  Negative for chest pain.      Objective:   Physical Exam Constitutional:      Appearance: She is obese.  HENT:     Head: Normocephalic and atraumatic.     Right Ear: Ear canal and external ear normal.     Left Ear: Ear canal and external ear normal.     Mouth/Throat:     Mouth: Mucous membranes are moist.     Pharynx: Oropharynx is clear. Posterior oropharyngeal erythema (mild) present.  Eyes:     Extraocular Movements: Extraocular movements intact.     Conjunctiva/sclera: Conjunctivae normal.     Pupils: Pupils are equal, round, and reactive to light.  Cardiovascular:     Rate and Rhythm: Normal rate and regular rhythm.     Heart sounds: Normal heart sounds.  Pulmonary:     Effort: Pulmonary effort is normal.     Breath sounds: Normal breath sounds.  Musculoskeletal:        General: Normal range of motion.     Cervical back: Normal range of motion and neck supple.  Skin:    General: Skin is warm and dry.  Neurological:     General: No focal deficit present.     Mental Status: She is oriented to person, place, and time. Mental status is at baseline.  Psychiatric:        Mood and Affect: Mood normal.        Behavior: Behavior normal.        Thought Content: Thought content normal.        Judgment: Judgment normal.           Assessment & Plan:  Cough S/p Covid 09/07/2021. Meds ordered this encounter  Medications   montelukast (SINGULAIR) 10 MG tablet    Sig: Take 1 tablet (10 mg total) by mouth at bedtime.    Dispense:  30 tablet    Refill:  3   benzonatate (TESSALON PERLES) 100 MG capsule    Sig: Take 1-2 every  8 hours as needed for cough    Dispense:  30 capsule    Refill:  0   OTC Flonase per package instructions.  One week follow up  for recheck if not improving. Patient verbalizes understanding and has no questions at discharge.

## 2021-10-29 ENCOUNTER — Other Ambulatory Visit: Payer: Self-pay | Admitting: Neurosurgery

## 2021-10-29 DIAGNOSIS — Z01818 Encounter for other preprocedural examination: Secondary | ICD-10-CM

## 2021-11-05 ENCOUNTER — Other Ambulatory Visit: Payer: Self-pay

## 2021-11-05 ENCOUNTER — Other Ambulatory Visit
Admission: RE | Admit: 2021-11-05 | Discharge: 2021-11-05 | Disposition: A | Discharge status: Home / Self Care | Payer: BC Managed Care – PPO | Source: Ambulatory Visit | Attending: Neurosurgery | Admitting: Neurosurgery

## 2021-11-05 DIAGNOSIS — Z01818 Encounter for other preprocedural examination: Secondary | ICD-10-CM | POA: Insufficient documentation

## 2021-11-05 HISTORY — DX: Type 2 diabetes mellitus without complications: E11.9

## 2021-11-05 HISTORY — DX: Unspecified asthma, uncomplicated: J45.909

## 2021-11-05 HISTORY — DX: Other complications of anesthesia, initial encounter: T88.59XA

## 2021-11-05 LAB — PROTIME-INR
INR: 1 (ref 0.8–1.2)
Prothrombin Time: 13.7 seconds (ref 11.4–15.2)

## 2021-11-05 LAB — TYPE AND SCREEN
ABO/RH(D): O POS
Antibody Screen: NEGATIVE

## 2021-11-05 LAB — BASIC METABOLIC PANEL WITH GFR
Anion gap: 8 (ref 5–15)
BUN: 11 mg/dL (ref 6–20)
CO2: 29 mmol/L (ref 22–32)
Calcium: 10 mg/dL (ref 8.9–10.3)
Chloride: 101 mmol/L (ref 98–111)
Creatinine, Ser: 0.78 mg/dL (ref 0.44–1.00)
GFR, Estimated: >60 mL/min
Glucose, Bld: 146 mg/dL — ABNORMAL HIGH (ref 70–99)
Potassium: 3.4 mmol/L — ABNORMAL LOW (ref 3.5–5.1)
Sodium: 138 mmol/L (ref 135–145)

## 2021-11-05 LAB — URINALYSIS, ROUTINE W REFLEX MICROSCOPIC
Bilirubin Urine: NEGATIVE
Glucose, UA: NEGATIVE mg/dL
Hgb urine dipstick: NEGATIVE
Ketones, ur: NEGATIVE mg/dL
Leukocytes,Ua: NEGATIVE
Nitrite: NEGATIVE
Protein, ur: NEGATIVE mg/dL
Specific Gravity, Urine: 1.02 (ref 1.005–1.030)
WBC, UA: NONE SEEN WBC/hpf (ref 0–5)
pH: 7 (ref 5.0–8.0)

## 2021-11-05 LAB — CBC
HCT: 41.9 % (ref 36.0–46.0)
Hemoglobin: 13.1 g/dL (ref 12.0–15.0)
MCH: 26 pg (ref 26.0–34.0)
MCHC: 31.3 g/dL (ref 30.0–36.0)
MCV: 83.1 fL (ref 80.0–100.0)
Platelets: 308 10*3/uL (ref 150–400)
RBC: 5.04 MIL/uL (ref 3.87–5.11)
RDW: 15.4 % (ref 11.5–15.5)
WBC: 11 10*3/uL — ABNORMAL HIGH (ref 4.0–10.5)
nRBC: 0 % (ref 0.0–0.2)

## 2021-11-05 LAB — SURGICAL PCR SCREEN
MRSA, PCR: NEGATIVE
Staphylococcus aureus: NEGATIVE

## 2021-11-05 LAB — APTT: aPTT: 33 seconds (ref 24–36)

## 2021-11-05 NOTE — Patient Instructions (Addendum)
Your procedure is scheduled on: Monday November 12, 2021. Report to Day Surgery inside [facility] 2nd floor, stop by admissions desk before getting on elevator. To find out your arrival time please call [phone] between 1PM - 3PM on Friday November 09, 2021.  Remember: Instructions that are not followed completely may result in serious medical risk,  up to and including death, or upon the discretion of your surgeon and anesthesiologist your  surgery may need to be rescheduled.     _X__ 1. Do not eat food after midnight the night before your procedure.                 No chewing gum or hard candies. You may drink clear liquids up to 2 hours                 before you are scheduled to arrive for your surgery- DO not drink clear                 liquids within 2 hours of the start of your surgery.                 Clear Liquids include:  water, apple juice without pulp, clear Gatorade, G2 or                  Gatorade Zero (avoid Red/Purple/Blue), Black Coffee or Tea (Do not add                 anything to coffee or tea).  __X__2.  On the morning of surgery brush your teeth with toothpaste and water, you                may rinse your mouth with mouthwash if you wish.  Do not swallow any toothpaste of mouthwash.     _X__ 3.  No Alcohol for 24 hours before or after surgery.   _X__ 4.  Do Not Smoke or use e-cigarettes For 24 Hours Prior to Your Surgery.                 Do not use any chewable tobacco products for at least 6 hours prior to                 Surgery.  _X__  5.  Do not use any recreational drugs (marijuana, cocaine, heroin, ecstasy, MDMA or other)                For at least one week prior to your surgery.  Combination of these drugs with anesthesia                May have life threatening results.  ____  6.  Bring all medications with you on the day of surgery if instructed.   __X__  7.  Notify your doctor if there is any change in your medical  condition      (cold, fever, infections).     Do not wear jewelry, make-up, hairpins, clips or nail polish. Do not wear lotions, powders, or perfumes. You may wear deodorant. Do not shave 48 hours prior to surgery. Men may shave face and neck. Do not bring valuables to the hospital.    [facility] is not responsible for any belongings or valuables.  Contacts, dentures or bridgework may not be worn into surgery. Leave your suitcase in the car. After surgery it may be brought to your room. For patients admitted to the hospital, discharge time is determined  by your treatment team.   Patients discharged the day of surgery will not be allowed to drive home.   Make arrangements for someone to be with you for the first 24 hours of your Same Day Discharge.    Please read over the following fact sheets that you were given:   Spine Surgery Patient Guide    __X__ Take these medicines the morning of surgery with A SIP OF WATER:    1. gabapentin (NEURONTIN) 300 MG  2.   3.   4.  5.  6.  ____ Fleet Enema (as directed)   __X__ Use CHG Soap (or wipes) as directed  ____ Use Benzoyl Peroxide Gel as instructed  __X__ Use inhalers on the day of surgery  albuterol (VENTOLIN HFA) 108 (90 Base) MCG/ACT inhaler  fluticasone-salmeterol (ADVAIR) 100-50 MCG/ACT AEPB ____ Stop metformin 2 days prior to surgery    ____ Take 1/2 of usual insulin dose the night before surgery. No insulin the morning          of surgery.   ____ Call your PCP, cardiologist, or Pulmonologist if taking Coumadin/Plavix/aspirin and ask when to stop before your surgery.   __X__ One Week prior to surgery- Stop Anti-inflammatories such as Ibuprofen, Aleve, Advil, Motrin, meloxicam (MOBIC), diclofenac, etodolac, ketorolac, Toradol, Daypro, piroxicam, Goody's or BC powders. OK TO USE TYLENOL IF NEEDED   __X__ Stop ALL supplements until after surgery.    ____ Bring C-Pap to the hospital.    If you have any questions  regarding your pre-procedure instructions,  Please call Pre-admit Testing at [phone]

## 2021-11-12 ENCOUNTER — Encounter
Admission: RE | Disposition: A | Discharge status: Home / Self Care | Payer: Self-pay | Source: Home / Self Care | Attending: Neurosurgery

## 2021-11-12 ENCOUNTER — Ambulatory Visit: Payer: BC Managed Care – PPO

## 2021-11-12 ENCOUNTER — Ambulatory Visit
Admission: RE | Admit: 2021-11-12 | Discharge: 2021-11-12 | Disposition: A | Discharge status: Home / Self Care | Payer: BC Managed Care – PPO | Attending: Neurosurgery | Admitting: Neurosurgery

## 2021-11-12 ENCOUNTER — Other Ambulatory Visit: Payer: Self-pay

## 2021-11-12 ENCOUNTER — Encounter: Payer: Self-pay | Admitting: Neurosurgery

## 2021-11-12 ENCOUNTER — Ambulatory Visit: Payer: BC Managed Care – PPO | Admitting: Urgent Care

## 2021-11-12 DIAGNOSIS — M5412 Radiculopathy, cervical region: Secondary | ICD-10-CM | POA: Insufficient documentation

## 2021-11-12 DIAGNOSIS — G959 Disease of spinal cord, unspecified: Secondary | ICD-10-CM | POA: Insufficient documentation

## 2021-11-12 DIAGNOSIS — M4802 Spinal stenosis, cervical region: Secondary | ICD-10-CM | POA: Insufficient documentation

## 2021-11-12 DIAGNOSIS — G8918 Other acute postprocedural pain: Secondary | ICD-10-CM

## 2021-11-12 HISTORY — DX: Nausea with vomiting, unspecified: R11.2

## 2021-11-12 HISTORY — DX: Other specified postprocedural states: Z98.890

## 2021-11-12 HISTORY — PX: ANTERIOR CERVICAL DECOMP/DISCECTOMY FUSION: SHX1161

## 2021-11-12 LAB — ABO/RH: ABO/RH(D): O POS

## 2021-11-12 LAB — GLUCOSE, CAPILLARY
Glucose-Capillary: 119 mg/dL — ABNORMAL HIGH (ref 70–99)
Glucose-Capillary: 126 mg/dL — ABNORMAL HIGH (ref 70–99)

## 2021-11-12 SURGERY — ANTERIOR CERVICAL DECOMPRESSION/DISCECTOMY FUSION 2 LEVELS
Anesthesia: General | Site: Spine Cervical

## 2021-11-12 MED ORDER — PROPOFOL 10 MG/ML IV BOLUS
INTRAVENOUS | Status: DC | PRN
Start: 2021-11-12 — End: 2021-11-12
  Administered 2021-11-12: 200 mg via INTRAVENOUS

## 2021-11-12 MED ORDER — REMIFENTANIL HCL 1 MG IV SOLR
INTRAVENOUS | Status: AC
  Filled 2021-11-12: qty 1000

## 2021-11-12 MED ORDER — PROPOFOL 1000 MG/100ML IV EMUL
INTRAVENOUS | Status: AC
  Filled 2021-11-12: qty 100

## 2021-11-12 MED ORDER — ONDANSETRON HCL 4 MG/2ML IJ SOLN
INTRAMUSCULAR | Status: AC
  Filled 2021-11-12: qty 2

## 2021-11-12 MED ORDER — OXYCODONE HCL 5 MG PO TABS
ORAL_TABLET | ORAL | Status: AC
  Filled 2021-11-12: qty 2

## 2021-11-12 MED ORDER — DIAZEPAM 5 MG/ML IJ SOLN
5.0000 mg | Freq: Once | INTRAMUSCULAR | Status: AC
  Administered 2021-11-12: 5 mg via INTRAVENOUS
  Filled 2021-11-12: qty 2

## 2021-11-12 MED ORDER — DEXMEDETOMIDINE (PRECEDEX) IN NS 20 MCG/5ML (4 MCG/ML) IV SYRINGE
PREFILLED_SYRINGE | INTRAVENOUS | Status: AC
  Filled 2021-11-12: qty 5

## 2021-11-12 MED ORDER — DEXAMETHASONE SODIUM PHOSPHATE 10 MG/ML IJ SOLN
INTRAMUSCULAR | Status: AC
  Filled 2021-11-12: qty 1

## 2021-11-12 MED ORDER — FAMOTIDINE 20 MG PO TABS: 20.0000 mg | ORAL_TABLET | Freq: Once | ORAL | Status: AC

## 2021-11-12 MED ORDER — CHLORHEXIDINE GLUCONATE 0.12 % MT SOLN: 15.0000 mL | Freq: Once | OROMUCOSAL | Status: AC

## 2021-11-12 MED ORDER — LACTATED RINGERS IV SOLN
INTRAVENOUS | Status: DC | PRN
Start: 2021-11-12 — End: 2021-11-12

## 2021-11-12 MED ORDER — PHENYLEPHRINE HCL-NACL 20-0.9 MG/250ML-% IV SOLN
INTRAVENOUS | Status: DC | PRN
  Administered 2021-11-12: 30 ug/min via INTRAVENOUS

## 2021-11-12 MED ORDER — ORAL CARE MOUTH RINSE: 15.0000 mL | Freq: Once | OROMUCOSAL | Status: AC

## 2021-11-12 MED ORDER — REMIFENTANIL HCL 1 MG IV SOLR
INTRAVENOUS | Status: DC | PRN
  Administered 2021-11-12: .05 ug/kg/min via INTRAVENOUS

## 2021-11-12 MED ORDER — DEXAMETHASONE SODIUM PHOSPHATE 10 MG/ML IJ SOLN
INTRAMUSCULAR | Status: DC | PRN
  Administered 2021-11-12: 10 mg via INTRAVENOUS

## 2021-11-12 MED ORDER — APREPITANT 40 MG PO CAPS
ORAL_CAPSULE | ORAL | Status: AC
  Administered 2021-11-12: 40 mg via ORAL
  Filled 2021-11-12: qty 1

## 2021-11-12 MED ORDER — BACLOFEN 10 MG PO TABS
5.0000 mg | ORAL_TABLET | Freq: Three times a day (TID) | ORAL | Status: DC
  Administered 2021-11-12: 5 mg via ORAL
  Filled 2021-11-12: qty 0.5

## 2021-11-12 MED ORDER — OXYCODONE-ACETAMINOPHEN 5-325 MG PO TABS
1.0000 | ORAL_TABLET | ORAL | Status: DC | PRN
  Administered 2021-11-12: 1 via ORAL

## 2021-11-12 MED ORDER — FAMOTIDINE 20 MG PO TABS
ORAL_TABLET | ORAL | Status: AC
  Administered 2021-11-12: 20 mg via ORAL
  Filled 2021-11-12: qty 1

## 2021-11-12 MED ORDER — FENTANYL CITRATE (PF) 100 MCG/2ML IJ SOLN
INTRAMUSCULAR | Status: AC
  Filled 2021-11-12: qty 2

## 2021-11-12 MED ORDER — HYDROMORPHONE HCL 1 MG/ML IJ SOLN
INTRAMUSCULAR | Status: AC
  Administered 2021-11-12: 0.5 mg via INTRAVENOUS
  Filled 2021-11-12: qty 1

## 2021-11-12 MED ORDER — APREPITANT 40 MG PO CAPS: 40.0000 mg | ORAL_CAPSULE | Freq: Once | ORAL | Status: AC

## 2021-11-12 MED ORDER — PROMETHAZINE HCL 25 MG/ML IJ SOLN: 6.2500 mg | INTRAMUSCULAR | Status: DC | PRN

## 2021-11-12 MED ORDER — ROCURONIUM BROMIDE 10 MG/ML (PF) SYRINGE
PREFILLED_SYRINGE | INTRAVENOUS | Status: AC
  Filled 2021-11-12: qty 10

## 2021-11-12 MED ORDER — PROPOFOL 10 MG/ML IV BOLUS
INTRAVENOUS | Status: AC
  Filled 2021-11-12: qty 20

## 2021-11-12 MED ORDER — PROMETHAZINE HCL 25 MG/ML IJ SOLN
INTRAMUSCULAR | Status: AC
  Administered 2021-11-12: 12.5 mg via INTRAVENOUS
  Filled 2021-11-12: qty 1

## 2021-11-12 MED ORDER — CHLORHEXIDINE GLUCONATE 0.12 % MT SOLN
OROMUCOSAL | Status: AC
  Administered 2021-11-12: 15 mL via OROMUCOSAL
  Filled 2021-11-12: qty 15

## 2021-11-12 MED ORDER — SUCCINYLCHOLINE CHLORIDE 200 MG/10ML IV SOSY
PREFILLED_SYRINGE | INTRAVENOUS | Status: DC | PRN
  Administered 2021-11-12 (×2): 120 mg via INTRAVENOUS

## 2021-11-12 MED ORDER — LIDOCAINE HCL (PF) 2 % IJ SOLN
INTRAMUSCULAR | Status: AC
  Filled 2021-11-12: qty 5

## 2021-11-12 MED ORDER — DEXMEDETOMIDINE (PRECEDEX) IN NS 20 MCG/5ML (4 MCG/ML) IV SYRINGE
PREFILLED_SYRINGE | INTRAVENOUS | Status: DC | PRN
  Administered 2021-11-12: 4 ug via INTRAVENOUS
  Administered 2021-11-12 (×2): 8 ug via INTRAVENOUS

## 2021-11-12 MED ORDER — FENTANYL CITRATE (PF) 100 MCG/2ML IJ SOLN
INTRAMUSCULAR | Status: AC
  Administered 2021-11-12: 50 ug via INTRAVENOUS
  Filled 2021-11-12: qty 2

## 2021-11-12 MED ORDER — CEFAZOLIN IN SODIUM CHLORIDE 3-0.9 GM/100ML-% IV SOLN
3.0000 g | INTRAVENOUS | Status: AC
  Administered 2021-11-12: 3 g via INTRAVENOUS
  Filled 2021-11-12: qty 100

## 2021-11-12 MED ORDER — CEFAZOLIN SODIUM-DEXTROSE 2-4 GM/100ML-% IV SOLN
INTRAVENOUS | Status: AC
  Filled 2021-11-12: qty 100

## 2021-11-12 MED ORDER — BUPIVACAINE-EPINEPHRINE (PF) 0.5% -1:200000 IJ SOLN
INTRAMUSCULAR | Status: AC
  Filled 2021-11-12: qty 30

## 2021-11-12 MED ORDER — OXYCODONE-ACETAMINOPHEN 5-325 MG PO TABS: 1.0000 | ORAL_TABLET | ORAL | 0 refills | Status: DC | PRN

## 2021-11-12 MED ORDER — HYDROMORPHONE HCL 1 MG/ML IJ SOLN
0.5000 mg | INTRAMUSCULAR | Status: AC | PRN
  Administered 2021-11-12 (×2): 0.5 mg via INTRAVENOUS

## 2021-11-12 MED ORDER — PHENYLEPHRINE HCL (PRESSORS) 10 MG/ML IV SOLN
INTRAVENOUS | Status: AC
  Filled 2021-11-12: qty 1

## 2021-11-12 MED ORDER — CYCLOBENZAPRINE HCL 10 MG PO TABS
10.0000 mg | ORAL_TABLET | Freq: Three times a day (TID) | ORAL | Status: DC | PRN
  Administered 2021-11-12: 10 mg via ORAL
  Filled 2021-11-12 (×2): qty 1

## 2021-11-12 MED ORDER — ONDANSETRON HCL 4 MG/2ML IJ SOLN
INTRAMUSCULAR | Status: DC | PRN
  Administered 2021-11-12: 4 mg via INTRAVENOUS

## 2021-11-12 MED ORDER — BUPIVACAINE-EPINEPHRINE (PF) 0.5% -1:200000 IJ SOLN
INTRAMUSCULAR | Status: DC | PRN
  Administered 2021-11-12: 6 mL

## 2021-11-12 MED ORDER — FENTANYL CITRATE (PF) 100 MCG/2ML IJ SOLN
INTRAMUSCULAR | Status: DC | PRN
  Administered 2021-11-12 (×2): 25 ug via INTRAVENOUS
  Administered 2021-11-12: 50 ug via INTRAVENOUS

## 2021-11-12 MED ORDER — MIDAZOLAM HCL 2 MG/2ML IJ SOLN
INTRAMUSCULAR | Status: AC
  Filled 2021-11-12: qty 2

## 2021-11-12 MED ORDER — LIDOCAINE HCL (CARDIAC) PF 100 MG/5ML IV SOSY
PREFILLED_SYRINGE | INTRAVENOUS | Status: DC | PRN
  Administered 2021-11-12: 100 mg via INTRAVENOUS

## 2021-11-12 MED ORDER — SODIUM CHLORIDE 0.9 % IV SOLN: INTRAVENOUS | Status: DC

## 2021-11-12 MED ORDER — SURGIFLO WITH THROMBIN (HEMOSTATIC MATRIX KIT) OPTIME
TOPICAL | Status: DC | PRN
  Administered 2021-11-12: 1 via TOPICAL

## 2021-11-12 MED ORDER — OXYCODONE HCL 5 MG PO TABS
10.0000 mg | ORAL_TABLET | Freq: Once | ORAL | Status: AC
  Administered 2021-11-12: 10 mg via ORAL

## 2021-11-12 MED ORDER — OXYCODONE-ACETAMINOPHEN 5-325 MG PO TABS
ORAL_TABLET | ORAL | Status: AC
  Filled 2021-11-12: qty 1

## 2021-11-12 MED ORDER — PROPOFOL 500 MG/50ML IV EMUL
INTRAVENOUS | Status: DC | PRN
  Administered 2021-11-12: 150 ug/kg/min via INTRAVENOUS

## 2021-11-12 MED ORDER — MIDAZOLAM HCL 2 MG/2ML IJ SOLN
INTRAMUSCULAR | Status: DC | PRN
  Administered 2021-11-12: 2 mg via INTRAVENOUS

## 2021-11-12 MED ORDER — SODIUM CHLORIDE 0.9 % IV SOLN: INTRAVENOUS | Status: DC | PRN

## 2021-11-12 MED ORDER — BACLOFEN 5 MG PO TABS: 5.0000 mg | ORAL_TABLET | Freq: Three times a day (TID) | ORAL | 0 refills | Status: DC | PRN

## 2021-11-12 MED ORDER — 0.9 % SODIUM CHLORIDE (POUR BTL) OPTIME
TOPICAL | Status: DC | PRN
Start: 2021-11-12 — End: 2021-11-12
  Administered 2021-11-12: 1000 mL

## 2021-11-12 MED ORDER — SENNA 8.6 MG PO TABS: 1.0000 | ORAL_TABLET | Freq: Every day | ORAL | 0 refills | Status: DC | PRN

## 2021-11-12 MED ORDER — FENTANYL CITRATE (PF) 100 MCG/2ML IJ SOLN
25.0000 ug | INTRAMUSCULAR | Status: DC | PRN
  Administered 2021-11-12: 50 ug via INTRAVENOUS

## 2021-11-12 SURGICAL SUPPLY — 61 items
ADH SKN CLS APL DERMABOND .7 (GAUZE/BANDAGES/DRESSINGS) ×1
AGENT HMST KT MTR STRL THRMB (HEMOSTASIS) ×1
APL PRP STRL LF DISP 70% ISPRP (MISCELLANEOUS) ×2
BUR NEURO DRILL SOFT 3.0X3.8M (BURR) ×2 IMPLANT
CHLORAPREP W/TINT 26 (MISCELLANEOUS) ×4 IMPLANT
COLLAR CERV MED MED DENS 3 (SOFTGOODS) ×1 IMPLANT
COUNTER NEEDLE 20/40 LG (NEEDLE) ×2 IMPLANT
CUP MEDICINE 2OZ PLAST GRAD ST (MISCELLANEOUS) ×2 IMPLANT
DERMABOND ADVANCED (GAUZE/BANDAGES/DRESSINGS) ×1
DERMABOND ADVANCED .7 DNX12 (GAUZE/BANDAGES/DRESSINGS) ×1 IMPLANT
DRAPE C ARM PK CFD 31 SPINE (DRAPES) ×2 IMPLANT
DRAPE LAPAROTOMY 77X122 PED (DRAPES) ×2 IMPLANT
DRAPE MICROSCOPE SPINE 48X150 (DRAPES) ×2 IMPLANT
DRAPE SURG 17X11 SM STRL (DRAPES) ×8 IMPLANT
ELECT CAUTERY BLADE TIP 2.5 (TIP) ×2
ELECT REM PT RETURN 9FT ADLT (ELECTROSURGICAL) ×2
ELECTRODE CAUTERY BLDE TIP 2.5 (TIP) ×1 IMPLANT
ELECTRODE REM PT RTRN 9FT ADLT (ELECTROSURGICAL) ×1 IMPLANT
FEE INTRAOP CADWELL SUPPLY NCS (MISCELLANEOUS) ×1 IMPLANT
FEE INTRAOP MONITOR IMPULS NCS (MISCELLANEOUS) IMPLANT
GAUZE 4X4 16PLY ~~LOC~~+RFID DBL (SPONGE) ×2 IMPLANT
GLOVE SURG SYN 6.5 ES PF (GLOVE) ×4 IMPLANT
GLOVE SURG SYN 6.5 PF PI (GLOVE) ×2 IMPLANT
GLOVE SURG SYN 8.5  E (GLOVE) ×3
GLOVE SURG SYN 8.5 E (GLOVE) ×3 IMPLANT
GLOVE SURG SYN 8.5 PF PI (GLOVE) ×3 IMPLANT
GLOVE SURG UNDER POLY LF SZ6.5 (GLOVE) ×4 IMPLANT
GOWN SRG LRG LVL 4 IMPRV REINF (GOWNS) ×2 IMPLANT
GOWN SRG XL LVL 3 NONREINFORCE (GOWNS) ×1 IMPLANT
GOWN STRL NON-REIN TWL XL LVL3 (GOWNS) ×2
GOWN STRL REIN LRG LVL4 (GOWNS) ×4
GRADUATE 1200CC STRL 31836 (MISCELLANEOUS) ×2 IMPLANT
INTRAOP CADWELL SUPPLY FEE NCS (MISCELLANEOUS) ×1
INTRAOP DISP SUPPLY FEE NCS (MISCELLANEOUS) ×2
INTRAOP MONITOR FEE IMPULS NCS (MISCELLANEOUS) ×1
INTRAOP MONITOR FEE IMPULSE (MISCELLANEOUS) ×1
KIT TURNOVER KIT A (KITS) ×2 IMPLANT
MANIFOLD NEPTUNE II (INSTRUMENTS) ×2 IMPLANT
MARKER SKIN DUAL TIP RULER LAB (MISCELLANEOUS) ×4 IMPLANT
NDL SAFETY ECLIPSE 18X1.5 (NEEDLE) ×1 IMPLANT
NEEDLE HYPO 18GX1.5 SHARP (NEEDLE) ×2
NEEDLE HYPO 22GX1.5 SAFETY (NEEDLE) ×2 IMPLANT
NS IRRIG 1000ML POUR BTL (IV SOLUTION) ×2 IMPLANT
PACK LAMINECTOMY NEURO (CUSTOM PROCEDURE TRAY) ×2 IMPLANT
PAD ARMBOARD 7.5X6 YLW CONV (MISCELLANEOUS) ×4 IMPLANT
PIN CASPAR 14 (PIN) ×1 IMPLANT
PIN CASPAR 14MM (PIN) ×2
PLATE ANT CERV XTEND 2 LV 28 (Plate) ×1 IMPLANT
PUTTY DBX 1CC (Putty) ×2 IMPLANT
PUTTY DBX 1CC DEPUY (Putty) IMPLANT
SCREW VAR 4.2 XD SELF DRILL 16 (Screw) ×6 IMPLANT
SPACER C HEDRON 12X14 7M 7D (Spacer) ×2 IMPLANT
SPONGE KITTNER 5P (MISCELLANEOUS) ×2 IMPLANT
SURGIFLO W/THROMBIN 8M KIT (HEMOSTASIS) ×2 IMPLANT
SUT V-LOC 90 ABS DVC 3-0 CL (SUTURE) ×2 IMPLANT
SUT VIC AB 3-0 SH 8-18 (SUTURE) ×2 IMPLANT
SYR 20ML LL LF (SYRINGE) ×1 IMPLANT
SYR 30ML LL (SYRINGE) ×2 IMPLANT
TAPE CLOTH 3X10 WHT NS LF (GAUZE/BANDAGES/DRESSINGS) ×2 IMPLANT
TOWEL OR 17X26 4PK STRL BLUE (TOWEL DISPOSABLE) ×4 IMPLANT
TUBING CONNECTING 10 (TUBING) ×2 IMPLANT

## 2021-11-12 NOTE — Anesthesia Procedure Notes (Signed)
Procedure Name: Intubation Date/Time: 11/12/2021 7:27 AM Performed by: Tollie Eth, CRNA Pre-anesthesia Checklist: Patient identified, Patient being monitored, Timeout performed, Emergency Drugs available and Suction available Patient Re-evaluated:Patient Re-evaluated prior to induction Oxygen Delivery Method: Circle system utilized Preoxygenation: Pre-oxygenation with 100% oxygen Induction Type: IV induction Ventilation: Mask ventilation without difficulty Laryngoscope Size: Mac, 3 and McGraph Grade View: Grade II Tube type: Oral Tube size: 7.0 mm Number of attempts: 1 Airway Equipment and Method: Stylet and LTA kit utilized Placement Confirmation: ETT inserted through vocal cords under direct vision, positive ETCO2 and breath sounds checked- equal and bilateral Secured at: 21 cm Tube secured with: Tape Dental Injury: Teeth and Oropharynx as per pre-operative assessment

## 2021-11-12 NOTE — Progress Notes (Signed)
Pt. C/O chest pain 7/10 center of chest / heaviness , denies SOB , pain is not radiating . Dr. Rosey Bath notified ECG obtained .

## 2021-11-12 NOTE — Progress Notes (Signed)
PHARMACY -  BRIEF ANTIBIOTIC NOTE   Pharmacy has received consult(s) for Cefazolin from an OR provider.  The patient's profile has been reviewed for ht/wt/allergies/indication/available labs.    One time order(s) placed for Cefazolin 3 gm pre-op per pt wt ~ 120 kg (119.7 kg).  Further antibiotics/pharmacy consults should be ordered by admitting physician if indicated.                       Thank you, Renda Rolls, PharmD, [facility] 11/12/2021 6:20 AM

## 2021-11-12 NOTE — Discharge Instructions (Addendum)
Your surgeon has performed an operation on your cervical spine (neck) to relieve pressure on the spinal cord and/or nerves. This involved making an incision in the front of your neck and removing one or more of the discs that support your spine. Next, a small piece of bone, a titanium plate, and screws were used to fuse two or more of the vertebrae (bones) together.  The following are instructions to help in your recovery once you have been discharged from the hospital. Even if you feel well, it is important that you follow these activity guidelines. If you do not let your neck heal properly from the surgery, you can increase the chance of return of your symptoms and other complications.  * Do not take anti-inflammatory medications for 3 months after surgery (naproxen [Aleve], ibuprofen [Advil, Motrin], etc.). These medications can prevent your bones from healing properly.  Activity    No bending, lifting, or twisting (BLT). Avoid lifting objects heavier than 10 pounds (gallon milk jug).  Where possible, avoid household activities that involve lifting, bending, reaching, pushing, or pulling such as laundry, vacuuming, grocery shopping, and childcare. Try to arrange for help from friends and family for these activities while your back heals.  Increase physical activity slowly as tolerated.  Taking short walks is encouraged, but avoid strenuous exercise. Do not jog, run, bicycle, lift weights, or participate in any other exercises unless specifically allowed by your doctor.  Talk to your doctor before resuming sexual activity.  You should not drive until cleared by your doctor.  Until released by your doctor, you should not return to work or school.  You should rest at home and let your body heal.   You may shower three days after your surgery.  After showering, lightly dab your incision dry. Do not take a tub bath or go swimming until approved by your doctor at your follow-up appointment.  If  your doctor ordered a cervical collar (neck brace) for you, you should wear it whenever you are out of bed. You may remove it when lying down or sleeping, but you should wear it at all other times. Not all neck surgeries require a cervical collar.  If you smoke, we strongly recommend that you quit.  Smoking has been proven to interfere with normal bone healing and will dramatically reduce the success rate of your surgery. Please contact [facility] (800-QUIT-NOW) and use the resources at www.[facility].com for assistance in stopping smoking.  Surgical Incision   Keep your incision area clean and dry.  Your incision was closed with Dermabond glue. The glue should begin to peel away within about a week Diet           You may return to your usual diet. However, you may experience discomfort when swallowing in the first month after your surgery. This is normal. You may find that softer foods are more comfortable for you to swallow. Be sure to stay hydrated.  When to Contact us  You may experience pain in your neck and/or pain between your shoulder blades. This is normal and should improve in the next few weeks with the help of pain medication, muscle relaxers, and rest. Some patients report that a warm compress on the back of the neck or between the shoulder blades helps.  However, should you experience any of the following, contact us immediately: New numbness or weakness Pain that is progressively getting worse, and is not relieved by your pain medication, muscle relaxers, rest, and warm compresses Bleeding,  redness, swelling, pain, or drainage from surgical incision Chills or flu-like symptoms Fever greater than 101.0 F (38.3 C) Inability to eat, drink fluids, or take medications Problems with bowel or bladder functions Difficulty breathing or shortness of breath Warmth, tenderness, or swelling in your calf Contact Information During office hours (Monday-Friday 9 am to 5 pm), please call  your physician at [phone] and ask for Berdine Addison After hours and weekends, please call [phone] and speak with the answering service, who will contact the doctor on call.  If that fails, call the [place] Operator at [phone] and ask for the Neurosurgery Resident On Call  For a life-threatening emergency, call [place]   The drugs that you were given will stay in your system until tomorrow so for the next 24 hours you should not:  Drive an automobile Make any legal decisions Drink any alcoholic beverage   You may resume regular meals tomorrow.  Today it is better to start with liquids and gradually work up to solid foods.  You may eat anything you prefer, but it is better to start with liquids, then soup and crackers, and gradually work up to solid foods.   Please notify your doctor immediately if you have any unusual bleeding, trouble breathing, redness and pain at the surgery site, drainage, fever, or pain not relieved by medication.    Additional Instructions:        Please contact your physician with any problems or Same Day Surgery at [phone], Monday through Friday 6 am to 4 pm, or [place] at [facility] number at [phone].

## 2021-11-12 NOTE — Progress Notes (Signed)
Dr. Rosey Bath at bedside for eval. Chest x ray ordered.

## 2021-11-12 NOTE — Anesthesia Preprocedure Evaluation (Signed)
Anesthesia Evaluation  Patient identified by MRN, date of birth, ID band Patient awake    Reviewed: Allergy & Precautions, H&P , NPO status , Patient's Chart, lab work & pertinent test results, reviewed documented beta blocker date and time   History of Anesthesia Complications (+) PONV and history of anesthetic complications  Airway Mallampati: IV  TM Distance: >3 FB Neck ROM: full    Dental  (+) Dental Advidsory Given, Caps   Pulmonary neg shortness of breath, asthma , neg sleep apnea, neg COPD, neg recent URI,    Pulmonary exam normal breath sounds clear to auscultation       Cardiovascular Exercise Tolerance: Good hypertension, (-) angina(-) Past MI and (-) Cardiac Stents Normal cardiovascular exam(-) dysrhythmias (-) Valvular Problems/Murmurs Rhythm:regular Rate:Normal     Neuro/Psych PSYCHIATRIC DISORDERS Anxiety Depression negative neurological ROS     GI/Hepatic negative GI ROS, Neg liver ROS,   Endo/Other  diabetes, Type 2, Oral Hypoglycemic AgentsMorbid obesity  Renal/GU negative Renal ROS  negative genitourinary   Musculoskeletal   Abdominal   Peds  Hematology negative hematology ROS (+)   Anesthesia Other Findings Past Medical History: No date: Asthma No date: Complication of anesthesia No date: Diabetes mellitus without complication (HCC) No date: PONV (postoperative nausea and vomiting)   Reproductive/Obstetrics negative OB ROS                             Anesthesia Physical Anesthesia Plan  ASA: 3  Anesthesia Plan: General   Post-op Pain Management:    Induction: Intravenous  PONV Risk Score and Plan: 4 or greater and Propofol infusion, TIVA, Midazolam, Ondansetron and Dexamethasone  Airway Management Planned: Oral ETT  Additional Equipment:   Intra-op Plan:   Post-operative Plan: Extubation in OR  Informed Consent: I have reviewed the patients History and  Physical, chart, labs and discussed the procedure including the risks, benefits and alternatives for the proposed anesthesia with the patient or authorized representative who has indicated his/her understanding and acceptance.     Dental Advisory Given  Plan Discussed with: Anesthesiologist, CRNA and Surgeon  Anesthesia Plan Comments:         Anesthesia Quick Evaluation

## 2021-11-12 NOTE — H&P (Signed)
I have reviewed and confirmed my history and physical from 10/29/21 with no additions or changes. Plan for C3-5 ACDF.  Risks and benefits reviewed.  Heart sounds normal no MRG. Chest Clear to Auscultation Bilaterally.

## 2021-11-12 NOTE — Op Note (Signed)
Indications: 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  Findings: severe stenosis  Preoperative Diagnosis: G95.9-Cervical Myelopathy, M54.12-Cervical Radiculopathy Postoperative Diagnosis: same   EBL: 25 ml IVF: 1000 ml Drains: none Disposition: Extubated and Stable to PACU Complications: none  No foley catheter was placed.   Preoperative Note:   Risks of surgery discussed include: infection, bleeding, stroke, coma, death, paralysis, CSF leak, nerve/spinal cord injury, numbness, tingling, weakness, complex regional pain syndrome, recurrent stenosis and/or disc herniation, vascular injury, development of instability, neck/back pain, need for further surgery, persistent symptoms, development of deformity, and the risks of anesthesia. The patient understood these risks and agreed to proceed.  Operative Note:   Procedure:  1) Anterior cervical diskectomy and fusion at C3/4 and C4/5 2) Anterior cervical instrumentation at C3 - 5 using Globus Xtend 3) Placement of biomechanical devices at C3/4 and C4/5  4) Use of operative microscope 5) Use of flouroscopy   Procedure: After obtaining informed consent, the patient taken to the operating room, placed in supine position, general anesthesia induced.  The patient had a small shoulder roll placed behind their shoulders.  The patient received preop antibiotics and IV Decadron.  The patient had a neck incision outlined, was prepped and draped in usual sterile fashion. The incision was injected with local anesthetic.   An incision was opened, dissection taken down medial to the carotid artery and jugular vein, lateral to the trachea and esophagus.  The prevertebral fascia identified and a localizing x-ray demonstrated the correct level.  The longus colli were dissected laterally, and self-retaining retractors placed to open the  operative field. The microscope was then brought into the field.  With this complete, distractor pins were placed in the vertebral bodies of C3 and C5. The distractor was placed, and the annuli at C3/4 and C4/5 were opened using a bovie.  Curettes and pituitary rongeurs used to remove the majority of disk, then the drill was used to remove the posterior osteophyte and begin the foraminotomies. The nerve hook was used to elevate the posterior longitudinal ligament, which was then removed with Kerrison rongeurs. The microblunt nerve hook could be passed out the foramina bilaterally at each level.   Meticulous hemostasis was obtained.  A biomechanical device (Globus Hedron 7 mm height x 14 mm width by 12 mm depth) was placed at C3/4. A second biomechanical device (Globus Hedron 7 mm height x 14 mm width by 12 mm depth) was placed at C4/5. Each device had been filled with allograft for aid in arthrodesis.  The caspar distractor was removed, and bone wax used for hemostasis. A 28 mm Globus Xtend plate was chosen.  Two screws placed in each vertebral body, respectively making sure the screws were behind the locking mechanism.  Final AP and lateral radiographs were taken.   With everything in good position, the wound was irrigated copiously with bacitracin-containing solution and meticulous hemostasis obtained.  Wound was closed in 2 layers using interrupted inverted 3-0 Vicryl sutures in the platysma and 3-0 monocryl on the dermis.  The wound was dressed with dermabond, the head of bed at 30 degrees, taken to recovery room in stable condition.  No new postop neurological deficits were identified.  Sponge and pattie counts were correct at the end of the procedure.     I performed the entire procedure with the assistance of Eileen Render PA as an Pensions consultant.  Eileen Maw  MD

## 2021-11-12 NOTE — Transfer of Care (Signed)
Immediate Anesthesia Transfer of Care Note  Patient: Eileen Chaney  Procedure(s) Performed: C3-5 ANTERIOR CERVICAL DISCECTOMY AND FUSION (GLOBUS HEDRON) (Spine Cervical)  Patient Location: PACU  Anesthesia Type:General  Level of Consciousness: drowsy and responds to stimulation  Airway & Oxygen Therapy: Patient Spontanous Breathing and Patient connected to face mask oxygen  Post-op Assessment: Report given to RN, Post -op Vital signs reviewed and stable and Patient moving all extremities  Post vital signs: Reviewed and stable  Last Vitals:  Vitals Value Taken Time  BP 111/77 11/12/21 0954  Temp 36.2 C 11/12/21 0954  Pulse 105 11/12/21 0957  Resp 17 11/12/21 0957  SpO2 96 % 11/12/21 0957  Vitals shown include unvalidated device data.  Last Pain:  Vitals:   11/12/21 0646  TempSrc: Temporal  PainSc: 0-No pain         Complications: No notable events documented.

## 2021-11-12 NOTE — Discharge Summary (Signed)
Physician Discharge Summary  Patient ID: Eileen Chaney MRN: [id number] DOB/AGE: Jun 07, 1976 46 y.o.  Admit date: 11/12/2021 Discharge date: 11/12/2021  Admission Diagnoses: cervical myleopathy  Discharge Diagnoses:  Active Problems:   * No active hospital problems. *   Discharged Condition: fair  Hospital Course:  Eileen Chaney id a 46 y.o presenting with cervical myelopathy s/p C3-5 ACDF. Her interoperative course was uncomplicated and she was monitored post-op. She experienced some neck pain which improved with medications. She was discharged home with a soft cervical collar for comfort, Baclofen, Oxycodone, and Senna.   Consults: None  Significant Diagnostic Studies: none  Treatments: surgery: as above. Please see separately dictated operative report for further details   Discharge Exam: Blood pressure 137/85, pulse (!) 101, temperature (!) 97.4 F (36.3 C), temperature source Temporal, resp. rate 18, height 5' 8" (1.727 m), weight 119.7 kg, SpO2 96 %. 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 Rinse mouth after use   atorvastatin 40 MG tablet Commonly known as: LIPITOR Take 1  tablet (40 mg total) by mouth daily.   Baclofen 5 MG Tabs Take 5 mg by mouth 3 (three) times daily as needed (muscle spasms).   cetirizine 10 MG tablet Commonly known as: ZYRTEC Take 1 tablet by mouth daily.   COLOSTRUM PO Take by mouth.   Contour Next Monitor w/Device Kit See admin instructions.   Contour Next Test test strip Generic drug: glucose blood 1 each 3 (three) times daily.   cyclobenzaprine 10 MG tablet Commonly known as: FLEXERIL Take 10 mg by mouth 3 (three) times daily as needed for muscle spasms.   diclofenac 75 MG EC tablet Commonly known as: VOLTAREN Take 75 mg by mouth 2 (two) times daily.   EPINEPHrine 0.3 mg/0.3 mL Soaj injection Commonly known as: EPI-PEN Inject 0.3 mg into the muscle as needed for anaphylaxis.   gabapentin 300 MG capsule Commonly known as: NEURONTIN Take 300 mg by mouth 3 (three) times daily as needed.   lisinopril 10 MG tablet Commonly known as: ZESTRIL Take 1 tablet (10 mg total) by mouth daily.   Multi-Vitamin tablet Take 1 tablet by mouth daily.   NALTREXONE HCL PO Take 4.5 mg by mouth daily.   oxyCODONE-acetaminophen 5-325 MG tablet Commonly known as: Percocet Take 1 tablet by mouth every 4 (four) hours as needed for severe pain.   senna 8.6 MG Tabs tablet Commonly known as: SENOKOT Take 1 tablet (8.6 mg total) by mouth daily as needed for mild constipation.   sertraline 100 MG tablet Commonly known as: ZOLOFT Take 2 tablets (200 mg total) by mouth daily.   spironolactone  50 MG tablet Commonly known as: ALDACTONE Take 50 mg by mouth daily.   Trulicity 3 JK/0.9FG Sopn Generic drug: Dulaglutide Inject 3 mg into the skin every Saturday.        Follow-up Information     Loleta Dicker, PA Follow up in 1 week(s).   Why: for incision check. Contact information: [address] [place] [address] [phone]                 Signed: Loleta Dicker 11/12/2021, 9:49 AM

## 2021-11-13 ENCOUNTER — Encounter: Payer: Self-pay | Admitting: Neurosurgery

## 2021-11-13 NOTE — Anesthesia Postprocedure Evaluation (Signed)
Anesthesia Post Note  Patient: Eileen Chaney  Procedure(s) Performed: C3-5 ANTERIOR CERVICAL DISCECTOMY AND FUSION (GLOBUS HEDRON) (Spine Cervical)  Patient location during evaluation: PACU Anesthesia Type: General Level of consciousness: awake and alert Pain management: pain level controlled Vital Signs Assessment: post-procedure vital signs reviewed and stable Respiratory status: spontaneous breathing, nonlabored ventilation, respiratory function stable and patient connected to nasal cannula oxygen Cardiovascular status: blood pressure returned to baseline and stable Postop Assessment: no apparent nausea or vomiting Anesthetic complications: no   No notable events documented.   Last Vitals:  Vitals:   11/12/21 1442 11/12/21 1557  BP: (!) 144/82 137/85  Pulse: 91 (!) 103  Resp: 18 18  Temp:    SpO2: 100% 96%    Last Pain:  Vitals:   11/12/21 1557  TempSrc:   PainSc: 8                  Martha Clan

## 2021-12-13 ENCOUNTER — Other Ambulatory Visit: Payer: Self-pay | Admitting: Family Medicine

## 2021-12-20 ENCOUNTER — Other Ambulatory Visit: Payer: Self-pay

## 2021-12-20 ENCOUNTER — Ambulatory Visit: Payer: BC Managed Care – PPO | Admitting: Nurse Practitioner

## 2021-12-20 ENCOUNTER — Encounter: Payer: Self-pay | Admitting: Nurse Practitioner

## 2021-12-20 VITALS — BP 120/80 | HR 103 | Temp 97.5°F | Resp 16

## 2021-12-20 DIAGNOSIS — H9203 Otalgia, bilateral: Secondary | ICD-10-CM

## 2021-12-20 NOTE — Progress Notes (Signed)
? ?  Subjective:  ? ? Patient ID: Eileen Chaney, female    DOB: December 10, 1975, 46 y.o.   MRN: [id number] ? ?HPI ?46 y/o female presents to the clinic with a 1 day history of bilateral ear pressure/fullness, sinus congestion, and PND. She denies fever, chills, headache, or body aches. Denies exposure to any sick contacts. She takes zyrtec daily and took a dose of Afrin this morning for congestion. Does report being fatigued but she had an ACDF repair several weeks ago and is still recovering. She is scheduled for a second cervical surgery on 01/07/22. ? ? ?Review of Systems  ?Constitutional:  Positive for fatigue. Negative for chills and fever.  ?HENT:  Positive for congestion, ear pain and postnasal drip. Negative for ear discharge, sinus pressure, sinus pain and sore throat.   ?Eyes: Negative.   ?Respiratory: Negative.    ?Cardiovascular: Negative.   ?Gastrointestinal: Negative.   ?Endocrine: Negative.   ?Genitourinary: Negative.   ?Musculoskeletal: Negative.   ?Skin: Negative.   ?Allergic/Immunologic: Negative.   ?Neurological: Negative.   ?Hematological: Negative.   ?Psychiatric/Behavioral: Negative.    ? ?   ?Objective:  ? Physical Exam ?Constitutional:   ?   General: She is not in acute distress. ?   Appearance: Normal appearance. She is obese. She is not ill-appearing.  ?HENT:  ?   Head: Normocephalic and atraumatic.  ?   Right Ear: Tenderness present. A middle ear effusion is present.  ?   Left Ear: Tenderness present. A middle ear effusion is present.  ?   Ears:  ?   Comments: Left TM dull ?   Nose: Nose normal.  ?   Mouth/Throat:  ?   Mouth: Mucous membranes are moist.  ?   Pharynx: Oropharynx is clear. Posterior oropharyngeal erythema present. No oropharyngeal exudate.  ?Eyes:  ?   Extraocular Movements: Extraocular movements intact.  ?   Conjunctiva/sclera: Conjunctivae normal.  ?Neck:  ?   Comments: Limited ROM d/u ACDF repair ?Cardiovascular:  ?   Rate and Rhythm: Normal rate and regular rhythm.  ?    Heart sounds: Normal heart sounds.  ?Pulmonary:  ?   Effort: Pulmonary effort is normal.  ?   Breath sounds: Normal breath sounds.  ?Musculoskeletal:  ?   Cervical back: Neck supple. Tenderness present.  ?Lymphadenopathy:  ?   Cervical: No cervical adenopathy.  ?Skin: ?   General: Skin is warm and dry.  ?Neurological:  ?   General: No focal deficit present.  ?   Mental Status: She is alert and oriented to person, place, and time.  ?Psychiatric:     ?   Mood and Affect: Mood normal.     ?   Behavior: Behavior normal.     ?   Thought Content: Thought content normal.     ?   Judgment: Judgment normal.  ? ?Vitals:  ? 12/20/21 1524  ?BP: 120/80  ?Pulse: (!) 103  ?Resp: 16  ?Temp: (!) 97.5 ?F (36.4 ?C)  ?TempSrc: Tympanic  ?SpO2: 96%  ?  ? ? ? ?   ?Assessment & Plan:  ?1. Ear pain, bilateral ?Encouraged patient to stop using Afrin to prevent rebound congestion. Encouraged to continue using OTC Zyrtec and to start OTC flonase to decrease Eustachian tube inflammation. Push fluids. May take tylenol or motrin for fever or body aches. If symptoms worsen or do not improve in 3-4 days, discussed returning to clinic for further evaluation. ?   ?

## 2021-12-21 ENCOUNTER — Other Ambulatory Visit: Payer: Self-pay | Admitting: Neurosurgery

## 2021-12-21 DIAGNOSIS — Z01818 Encounter for other preprocedural examination: Secondary | ICD-10-CM

## 2021-12-28 ENCOUNTER — Encounter
Admission: RE | Admit: 2021-12-28 | Discharge: 2021-12-28 | Disposition: A | Discharge status: Home / Self Care | Payer: BC Managed Care – PPO | Source: Ambulatory Visit | Attending: Neurosurgery | Admitting: Neurosurgery

## 2021-12-28 VITALS — BP 115/79 | HR 85 | Resp 14 | Ht 68.0 in | Wt 257.0 lb

## 2021-12-28 DIAGNOSIS — Z01812 Encounter for preprocedural laboratory examination: Secondary | ICD-10-CM | POA: Diagnosis present

## 2021-12-28 DIAGNOSIS — I10 Essential (primary) hypertension: Secondary | ICD-10-CM | POA: Insufficient documentation

## 2021-12-28 DIAGNOSIS — E119 Type 2 diabetes mellitus without complications: Secondary | ICD-10-CM | POA: Diagnosis not present

## 2021-12-28 DIAGNOSIS — Z794 Long term (current) use of insulin: Secondary | ICD-10-CM | POA: Diagnosis not present

## 2021-12-28 HISTORY — DX: Radiculopathy, cervical region: M54.12

## 2021-12-28 HISTORY — DX: Depression, unspecified: F32.A

## 2021-12-28 HISTORY — DX: Pneumonia, unspecified organism: J18.9

## 2021-12-28 HISTORY — DX: Essential (primary) hypertension: I10

## 2021-12-28 HISTORY — DX: Anxiety disorder, unspecified: F41.9

## 2021-12-28 HISTORY — DX: Family history of other specified conditions: Z84.89

## 2021-12-28 HISTORY — DX: Other cervical disc degeneration, unspecified cervical region: M50.30

## 2021-12-28 HISTORY — DX: Disease of spinal cord, unspecified: G95.9

## 2021-12-28 HISTORY — DX: Unspecified osteoarthritis, unspecified site: M19.90

## 2021-12-28 HISTORY — DX: Idiopathic urticaria: L50.1

## 2021-12-28 HISTORY — DX: Hyperlipidemia, unspecified: E78.5

## 2021-12-28 LAB — TYPE AND SCREEN
ABO/RH(D): O POS
Antibody Screen: NEGATIVE

## 2021-12-28 LAB — POTASSIUM: Potassium: 3.9 mmol/L (ref 3.5–5.1)

## 2021-12-28 NOTE — Patient Instructions (Addendum)
Your procedure is scheduled on: Monday, April 10 ?Report to the Registration Desk on the 1st floor of the [facility]. ?To find out your arrival time, please call [phone] between 1PM - 3PM on: Friday, April 7 ? ?REMEMBER: ?Instructions that are not followed completely may result in serious medical risk, up to and including death; or upon the discretion of your surgeon and anesthesiologist your surgery may need to be rescheduled. ? ?Do not eat food after midnight the night before surgery.  ?No gum chewing, lozengers or hard candies. ? ?You may however, drink water up to 2 hours before you are scheduled to arrive for your surgery. Do not drink anything within 2 hours of your scheduled arrival time. ? ?TAKE THESE MEDICATIONS THE MORNING OF SURGERY WITH A SIP OF WATER: ? ?Albuterol inhaler ? ?Use inhalers on the day of surgery and bring to the hospital. ? ?One week prior to surgery: starting April 3 ?Stop Anti-inflammatories (NSAIDS) such as Advil, Aleve, Ibuprofen, Motrin, Naproxen, Naprosyn and Aspirin based products such as Excedrin, Goodys Powder, BC Powder. ?Stop ANY OVER THE COUNTER supplements until after surgery. Stop multiple vitamin ?You may however, continue to take Tylenol if needed for pain up until the day of surgery. ? ?No Alcohol for 24 hours before or after surgery. ? ?No Smoking including e-cigarettes for 24 hours prior to surgery.  ?No chewable tobacco products for at least 6 hours prior to surgery.  ?No nicotine patches on the day of surgery. ? ?Do not use any "recreational" drugs for at least a week prior to your surgery.  ?Please be advised that the combination of cocaine and anesthesia may have negative outcomes, up to and including death. ?If you test positive for cocaine, your surgery will be cancelled. ? ?On the morning of surgery brush your teeth with toothpaste and water, you may rinse your mouth with mouthwash if you wish. ?Do not swallow any toothpaste or mouthwash. ? ?Use CHG Soap  as directed on instruction sheet. ? ?Do not wear jewelry, make-up, hairpins, clips or nail polish. ? ?Do not wear lotions, powders, or perfumes.  ? ?Do not shave body from the neck down 48 hours prior to surgery just in case you cut yourself which could leave a site for infection.  ?Also, freshly shaved skin may become irritated if using the CHG soap. ? ?Contact lenses, hearing aids and dentures may not be worn into surgery. ? ?Do not bring valuables to the hospital. [facility] is not responsible for any missing/lost belongings or valuables.  ? ?Notify your doctor if there is any change in your medical condition (cold, fever, infection). ? ?Wear comfortable clothing (specific to your surgery type) to the hospital. ? ?After surgery, you can help prevent lung complications by doing breathing exercises.  ?Take deep breaths and cough every 1-2 hours. Your doctor may order a device called an Incentive Spirometer to help you take deep breaths. ? ?If you are being discharged the day of surgery, you will not be allowed to drive home. ?You will need a responsible adult (18 years or older) to drive you home and stay with you that night.  ? ?If you are taking public transportation, you will need to have a responsible adult (18 years or older) with you. ?Please confirm with your physician that it is acceptable to use public transportation.  ? ?Please call the [place] Dept[facility] at [phone] if you have any questions about these instructions. ? ?Surgery Visitation Policy: ? ?Patients  undergoing a surgery or procedure may have two family members or support persons with them as long as the person is not COVID-19 positive or experiencing its symptoms.  ?

## 2021-12-31 ENCOUNTER — Ambulatory Visit
Admission: RE | Admit: 2021-12-31 | Discharge: 2021-12-31 | Disposition: A | Discharge status: Home / Self Care | Payer: BC Managed Care – PPO | Source: Ambulatory Visit | Attending: Family Medicine | Admitting: Family Medicine

## 2021-12-31 DIAGNOSIS — M461 Sacroiliitis, not elsewhere classified: Secondary | ICD-10-CM

## 2021-12-31 DIAGNOSIS — M5416 Radiculopathy, lumbar region: Secondary | ICD-10-CM

## 2022-01-07 ENCOUNTER — Other Ambulatory Visit: Payer: Self-pay

## 2022-01-07 ENCOUNTER — Ambulatory Visit
Admission: RE | Admit: 2022-01-07 | Discharge: 2022-01-07 | Disposition: A | Discharge status: Home / Self Care | Payer: BC Managed Care – PPO | Attending: Neurosurgery | Admitting: Neurosurgery

## 2022-01-07 ENCOUNTER — Encounter: Payer: Self-pay | Admitting: Neurosurgery

## 2022-01-07 ENCOUNTER — Ambulatory Visit: Payer: BC Managed Care – PPO

## 2022-01-07 ENCOUNTER — Encounter
Admission: RE | Disposition: A | Discharge status: Home / Self Care | Payer: Self-pay | Source: Home / Self Care | Attending: Neurosurgery

## 2022-01-07 ENCOUNTER — Ambulatory Visit: Payer: BC Managed Care – PPO | Admitting: Certified Registered"

## 2022-01-07 ENCOUNTER — Ambulatory Visit: Payer: BC Managed Care – PPO | Admitting: Urgent Care

## 2022-01-07 DIAGNOSIS — M25511 Pain in right shoulder: Secondary | ICD-10-CM | POA: Diagnosis not present

## 2022-01-07 DIAGNOSIS — M5412 Radiculopathy, cervical region: Secondary | ICD-10-CM | POA: Diagnosis present

## 2022-01-07 DIAGNOSIS — M25512 Pain in left shoulder: Secondary | ICD-10-CM | POA: Insufficient documentation

## 2022-01-07 DIAGNOSIS — M4802 Spinal stenosis, cervical region: Secondary | ICD-10-CM | POA: Insufficient documentation

## 2022-01-07 HISTORY — PX: POSTERIOR CERVICAL LAMINECTOMY: SHX2248

## 2022-01-07 LAB — GLUCOSE, CAPILLARY
Glucose-Capillary: 107 mg/dL — ABNORMAL HIGH (ref 70–99)
Glucose-Capillary: 120 mg/dL — ABNORMAL HIGH (ref 70–99)

## 2022-01-07 SURGERY — POSTERIOR CERVICAL LAMINECTOMY
Anesthesia: General | Site: Spine Cervical

## 2022-01-07 MED ORDER — FENTANYL CITRATE (PF) 100 MCG/2ML IJ SOLN
INTRAMUSCULAR | Status: AC
Start: 2022-01-07 — End: ?
  Filled 2022-01-07: qty 2

## 2022-01-07 MED ORDER — LIDOCAINE HCL (CARDIAC) PF 100 MG/5ML IV SOSY
PREFILLED_SYRINGE | INTRAVENOUS | Status: DC | PRN
  Administered 2022-01-07: 100 mg via INTRAVENOUS

## 2022-01-07 MED ORDER — HYDROMORPHONE HCL 1 MG/ML IJ SOLN
0.5000 mg | INTRAMUSCULAR | Status: AC | PRN
  Administered 2022-01-07 (×2): 0.5 mg via INTRAVENOUS

## 2022-01-07 MED ORDER — SODIUM CHLORIDE (PF) 0.9 % IJ SOLN
INTRAMUSCULAR | Status: DC | PRN
  Administered 2022-01-07: 60 mL via INTRAMUSCULAR

## 2022-01-07 MED ORDER — KETAMINE HCL 50 MG/5ML IJ SOSY
PREFILLED_SYRINGE | INTRAMUSCULAR | Status: AC
Start: 2022-01-07 — End: ?
  Filled 2022-01-07: qty 5

## 2022-01-07 MED ORDER — SURGIFLO WITH THROMBIN (HEMOSTATIC MATRIX KIT) OPTIME
TOPICAL | Status: DC | PRN
  Administered 2022-01-07: 1 via TOPICAL

## 2022-01-07 MED ORDER — ONDANSETRON HCL 4 MG/2ML IJ SOLN
INTRAMUSCULAR | Status: DC | PRN
  Administered 2022-01-07: 4 mg via INTRAVENOUS

## 2022-01-07 MED ORDER — REMIFENTANIL HCL 1 MG IV SOLR
INTRAVENOUS | Status: AC
  Filled 2022-01-07: qty 1000

## 2022-01-07 MED ORDER — HYDROMORPHONE HCL 1 MG/ML IJ SOLN
INTRAMUSCULAR | Status: AC
  Administered 2022-01-07: 0.5 mg via INTRAVENOUS
  Filled 2022-01-07: qty 1

## 2022-01-07 MED ORDER — OXYCODONE-ACETAMINOPHEN 5-325 MG PO TABS: 1.0000 | ORAL_TABLET | ORAL | 0 refills | Status: AC | PRN

## 2022-01-07 MED ORDER — SODIUM CHLORIDE (PF) 0.9 % IJ SOLN
INTRAMUSCULAR | Status: AC
  Filled 2022-01-07: qty 20

## 2022-01-07 MED ORDER — ACETAMINOPHEN 10 MG/ML IV SOLN: 1000.0000 mg | Freq: Once | INTRAVENOUS | Status: DC | PRN

## 2022-01-07 MED ORDER — DEXMEDETOMIDINE HCL IN NACL 200 MCG/50ML IV SOLN
INTRAVENOUS | Status: DC | PRN
  Administered 2022-01-07 (×3): 4 ug via INTRAVENOUS
  Administered 2022-01-07: 8 ug via INTRAVENOUS

## 2022-01-07 MED ORDER — CEFAZOLIN SODIUM-DEXTROSE 2-4 GM/100ML-% IV SOLN
INTRAVENOUS | Status: AC
  Filled 2022-01-07: qty 100

## 2022-01-07 MED ORDER — BUPIVACAINE-EPINEPHRINE (PF) 0.5% -1:200000 IJ SOLN
INTRAMUSCULAR | Status: DC | PRN
  Administered 2022-01-07: 4 mL

## 2022-01-07 MED ORDER — GLYCOPYRROLATE 0.2 MG/ML IJ SOLN
INTRAMUSCULAR | Status: AC
  Filled 2022-01-07: qty 1

## 2022-01-07 MED ORDER — METHOCARBAMOL 1000 MG/10ML IJ SOLN
500.0000 mg | Freq: Four times a day (QID) | INTRAVENOUS | Status: DC | PRN
  Administered 2022-01-07: 500 mg via INTRAVENOUS
  Filled 2022-01-07: qty 5
  Filled 2022-01-07: qty 500

## 2022-01-07 MED ORDER — SUCCINYLCHOLINE CHLORIDE 200 MG/10ML IV SOSY
PREFILLED_SYRINGE | INTRAVENOUS | Status: DC | PRN
  Administered 2022-01-07: 140 mg via INTRAVENOUS

## 2022-01-07 MED ORDER — MIDAZOLAM HCL 2 MG/2ML IJ SOLN
INTRAMUSCULAR | Status: DC | PRN
  Administered 2022-01-07: 2 mg via INTRAVENOUS

## 2022-01-07 MED ORDER — PROPOFOL 10 MG/ML IV BOLUS
INTRAVENOUS | Status: AC
  Filled 2022-01-07: qty 20

## 2022-01-07 MED ORDER — FENTANYL CITRATE (PF) 100 MCG/2ML IJ SOLN
INTRAMUSCULAR | Status: DC | PRN
  Administered 2022-01-07 (×2): 50 ug via INTRAVENOUS

## 2022-01-07 MED ORDER — OXYCODONE HCL 5 MG PO TABS: 5.0000 mg | ORAL_TABLET | Freq: Once | ORAL | Status: AC | PRN

## 2022-01-07 MED ORDER — APREPITANT 40 MG PO CAPS
40.0000 mg | ORAL_CAPSULE | Freq: Once | ORAL | Status: AC
  Administered 2022-01-07: 40 mg via ORAL

## 2022-01-07 MED ORDER — MIDAZOLAM HCL 2 MG/2ML IJ SOLN
INTRAMUSCULAR | Status: AC
  Filled 2022-01-07: qty 2

## 2022-01-07 MED ORDER — FAMOTIDINE 20 MG PO TABS
ORAL_TABLET | ORAL | Status: AC
  Administered 2022-01-07: 20 mg via ORAL
  Filled 2022-01-07: qty 1

## 2022-01-07 MED ORDER — ONDANSETRON HCL 4 MG/2ML IJ SOLN: 4.0000 mg | Freq: Once | INTRAMUSCULAR | Status: DC | PRN

## 2022-01-07 MED ORDER — CEFAZOLIN SODIUM-DEXTROSE 2-4 GM/100ML-% IV SOLN
2.0000 g | INTRAVENOUS | Status: AC
  Administered 2022-01-07: 2 g via INTRAVENOUS

## 2022-01-07 MED ORDER — BUPIVACAINE HCL (PF) 0.5 % IJ SOLN
INTRAMUSCULAR | Status: AC
  Filled 2022-01-07: qty 30

## 2022-01-07 MED ORDER — FAMOTIDINE 20 MG PO TABS: 20.0000 mg | ORAL_TABLET | Freq: Once | ORAL | Status: AC

## 2022-01-07 MED ORDER — PHENYLEPHRINE HCL-NACL 20-0.9 MG/250ML-% IV SOLN
INTRAVENOUS | Status: DC | PRN
  Administered 2022-01-07: 40 ug/min via INTRAVENOUS

## 2022-01-07 MED ORDER — PHENYLEPHRINE HCL (PRESSORS) 10 MG/ML IV SOLN
INTRAVENOUS | Status: AC
  Filled 2022-01-07: qty 1

## 2022-01-07 MED ORDER — OXYCODONE HCL 5 MG PO TABS
ORAL_TABLET | ORAL | Status: AC
  Administered 2022-01-07: 5 mg via ORAL
  Filled 2022-01-07: qty 1

## 2022-01-07 MED ORDER — KETAMINE HCL 10 MG/ML IJ SOLN
INTRAMUSCULAR | Status: DC | PRN
  Administered 2022-01-07: 10 mg via INTRAVENOUS
  Administered 2022-01-07: 40 mg via INTRAVENOUS

## 2022-01-07 MED ORDER — ACETAMINOPHEN 10 MG/ML IV SOLN
INTRAVENOUS | Status: AC
  Filled 2022-01-07: qty 100

## 2022-01-07 MED ORDER — FENTANYL CITRATE (PF) 100 MCG/2ML IJ SOLN
INTRAMUSCULAR | Status: AC
  Administered 2022-01-07: 50 ug via INTRAVENOUS
  Filled 2022-01-07: qty 2

## 2022-01-07 MED ORDER — FENTANYL CITRATE (PF) 100 MCG/2ML IJ SOLN
INTRAMUSCULAR | Status: AC
Start: 2022-01-07 — End: 2022-01-07
  Administered 2022-01-07: 50 ug via INTRAVENOUS
  Filled 2022-01-07: qty 2

## 2022-01-07 MED ORDER — LIDOCAINE HCL (PF) 2 % IJ SOLN
INTRAMUSCULAR | Status: AC
  Filled 2022-01-07: qty 5

## 2022-01-07 MED ORDER — BUPIVACAINE LIPOSOME 1.3 % IJ SUSP
INTRAMUSCULAR | Status: AC
  Filled 2022-01-07: qty 20

## 2022-01-07 MED ORDER — SUCCINYLCHOLINE CHLORIDE 200 MG/10ML IV SOSY
PREFILLED_SYRINGE | INTRAVENOUS | Status: AC
  Filled 2022-01-07: qty 10

## 2022-01-07 MED ORDER — BUPIVACAINE-EPINEPHRINE (PF) 0.5% -1:200000 IJ SOLN
INTRAMUSCULAR | Status: AC
  Filled 2022-01-07: qty 30

## 2022-01-07 MED ORDER — PROPOFOL 1000 MG/100ML IV EMUL
INTRAVENOUS | Status: AC
  Filled 2022-01-07: qty 100

## 2022-01-07 MED ORDER — ACETAMINOPHEN 10 MG/ML IV SOLN
INTRAVENOUS | Status: DC | PRN
  Administered 2022-01-07: 1000 mg via INTRAVENOUS

## 2022-01-07 MED ORDER — OXYCODONE HCL 5 MG/5ML PO SOLN: 5.0000 mg | Freq: Once | ORAL | Status: AC | PRN

## 2022-01-07 MED ORDER — DEXAMETHASONE SODIUM PHOSPHATE 10 MG/ML IJ SOLN
INTRAMUSCULAR | Status: AC
  Filled 2022-01-07: qty 1

## 2022-01-07 MED ORDER — METHYLPREDNISOLONE ACETATE 40 MG/ML IJ SUSP
INTRAMUSCULAR | Status: AC
  Filled 2022-01-07: qty 1

## 2022-01-07 MED ORDER — CHLORHEXIDINE GLUCONATE 0.12 % MT SOLN: 15.0000 mL | Freq: Once | OROMUCOSAL | Status: AC

## 2022-01-07 MED ORDER — ONDANSETRON HCL 4 MG/2ML IJ SOLN
INTRAMUSCULAR | Status: AC
  Filled 2022-01-07: qty 2

## 2022-01-07 MED ORDER — METHYLPREDNISOLONE ACETATE 40 MG/ML IJ SUSP
INTRAMUSCULAR | Status: DC | PRN
  Administered 2022-01-07: 40 mg

## 2022-01-07 MED ORDER — DEXAMETHASONE SODIUM PHOSPHATE 10 MG/ML IJ SOLN
INTRAMUSCULAR | Status: DC | PRN
  Administered 2022-01-07: 10 mg via INTRAVENOUS

## 2022-01-07 MED ORDER — SODIUM CHLORIDE 0.9 % IV SOLN: INTRAVENOUS | Status: DC

## 2022-01-07 MED ORDER — GLYCOPYRROLATE 0.2 MG/ML IJ SOLN
INTRAMUSCULAR | Status: DC | PRN
  Administered 2022-01-07: .1 mg via INTRAVENOUS

## 2022-01-07 MED ORDER — FENTANYL CITRATE (PF) 100 MCG/2ML IJ SOLN
25.0000 ug | INTRAMUSCULAR | Status: DC | PRN
  Administered 2022-01-07: 50 ug via INTRAVENOUS

## 2022-01-07 MED ORDER — SODIUM CHLORIDE FLUSH 0.9 % IV SOLN
INTRAVENOUS | Status: AC
  Filled 2022-01-07: qty 30

## 2022-01-07 MED ORDER — ORAL CARE MOUTH RINSE: 15.0000 mL | Freq: Once | OROMUCOSAL | Status: AC

## 2022-01-07 MED ORDER — REMIFENTANIL HCL 1 MG IV SOLR
INTRAVENOUS | Status: DC | PRN
  Administered 2022-01-07: .03 ug/kg/min via INTRAVENOUS

## 2022-01-07 MED ORDER — DEXMEDETOMIDINE HCL IN NACL 80 MCG/20ML IV SOLN
INTRAVENOUS | Status: AC
  Filled 2022-01-07: qty 20

## 2022-01-07 MED ORDER — APREPITANT 40 MG PO CAPS
ORAL_CAPSULE | ORAL | Status: AC
  Filled 2022-01-07: qty 1

## 2022-01-07 MED ORDER — CHLORHEXIDINE GLUCONATE 0.12 % MT SOLN
OROMUCOSAL | Status: AC
  Administered 2022-01-07: 15 mL via OROMUCOSAL
  Filled 2022-01-07: qty 15

## 2022-01-07 MED ORDER — PROPOFOL 10 MG/ML IV BOLUS
INTRAVENOUS | Status: DC | PRN
  Administered 2022-01-07: 150 ug/kg/min via INTRAVENOUS
  Administered 2022-01-07: 150 mg via INTRAVENOUS

## 2022-01-07 MED ORDER — 0.9 % SODIUM CHLORIDE (POUR BTL) OPTIME
TOPICAL | Status: DC | PRN
  Administered 2022-01-07: 500 mL

## 2022-01-07 SURGICAL SUPPLY — 46 items
BLADE SURG 15 STRL LF DISP TIS (BLADE) ×1 IMPLANT
BLADE SURG 15 STRL SS (BLADE) ×1
BUR NEURO DRILL SOFT 3.0X3.8M (BURR) ×2 IMPLANT
CHLORAPREP W/TINT 26 (MISCELLANEOUS) ×3 IMPLANT
COUNTER NEEDLE 20/40 LG (NEEDLE) ×2 IMPLANT
CUP MEDICINE 2OZ PLAST GRAD ST (MISCELLANEOUS) ×4 IMPLANT
DERMABOND ADVANCED (GAUZE/BANDAGES/DRESSINGS) ×1
DERMABOND ADVANCED .7 DNX12 (GAUZE/BANDAGES/DRESSINGS) ×1 IMPLANT
DRAPE C ARM PK CFD 31 SPINE (DRAPES) ×2 IMPLANT
DRAPE LAPAROTOMY 100X77 ABD (DRAPES) ×2 IMPLANT
DRAPE SURG 17X11 SM STRL (DRAPES) ×8 IMPLANT
DRSG OPSITE POSTOP 4X6 (GAUZE/BANDAGES/DRESSINGS) ×1 IMPLANT
ELECT CAUTERY BLADE TIP 2.5 (TIP) ×2
ELECTRODE CAUTERY BLDE TIP 2.5 (TIP) ×1 IMPLANT
GAUZE 4X4 16PLY ~~LOC~~+RFID DBL (SPONGE) ×2 IMPLANT
GAUZE SPONGE 4X4 12PLY STRL (GAUZE/BANDAGES/DRESSINGS) ×2 IMPLANT
GLOVE SURG SYN 6.5 ES PF (GLOVE) ×2 IMPLANT
GLOVE SURG SYN 6.5 PF PI (GLOVE) ×1 IMPLANT
GLOVE SURG SYN 8.5  E (GLOVE) ×3
GLOVE SURG SYN 8.5 E (GLOVE) ×3 IMPLANT
GLOVE SURG SYN 8.5 PF PI (GLOVE) ×3 IMPLANT
GLOVE SURG UNDER POLY LF SZ6.5 (GLOVE) ×2 IMPLANT
GOWN SRG LRG LVL 4 IMPRV REINF (GOWNS) ×1 IMPLANT
GOWN SRG XL LVL 3 NONREINFORCE (GOWNS) ×1 IMPLANT
GOWN STRL NON-REIN TWL XL LVL3 (GOWNS) ×1
GOWN STRL REIN LRG LVL4 (GOWNS) ×1
GRADUATE 1200CC STRL 31836 (MISCELLANEOUS) ×2 IMPLANT
KIT TURNOVER KIT A (KITS) ×2 IMPLANT
MANIFOLD NEPTUNE II (INSTRUMENTS) ×2 IMPLANT
MARKER SKIN DUAL TIP RULER LAB (MISCELLANEOUS) ×4 IMPLANT
NDL SPNL 18GX3.5 QUINCKE PK (NEEDLE) ×3 IMPLANT
NEEDLE HYPO 22GX1.5 SAFETY (NEEDLE) ×2 IMPLANT
NEEDLE SPNL 18GX3.5 QUINCKE PK (NEEDLE) ×6 IMPLANT
NS IRRIG 500ML POUR BTL (IV SOLUTION) ×1 IMPLANT
PACK LAMINECTOMY NEURO (CUSTOM PROCEDURE TRAY) ×2 IMPLANT
PAD ARMBOARD 7.5X6 YLW CONV (MISCELLANEOUS) ×4 IMPLANT
STAPLER SKIN PROX 35W (STAPLE) ×4 IMPLANT
SURGIFLO W/THROMBIN 8M KIT (HEMOSTASIS) ×2 IMPLANT
SUT V-LOC 90 ABS DVC 3-0 CL (SUTURE) ×2 IMPLANT
SUT VIC AB 0 CT1 27 (SUTURE) ×3
SUT VIC AB 0 CT1 27XCR 8 STRN (SUTURE) ×3 IMPLANT
SUT VIC AB 2-0 CT1 18 (SUTURE) ×4 IMPLANT
SYR 30ML LL (SYRINGE) ×6 IMPLANT
TAPE CLOTH 3X10 WHT NS LF (GAUZE/BANDAGES/DRESSINGS) ×4 IMPLANT
TOWEL OR 17X26 4PK STRL BLUE (TOWEL DISPOSABLE) ×8 IMPLANT
TUBING CONNECTING 10 (TUBING) ×3 IMPLANT

## 2022-01-07 NOTE — Anesthesia Procedure Notes (Signed)
Procedure Name: Intubation ?Date/Time: 01/07/2022 7:25 AM ?Performed by: Cammie Sickle, CRNA ?Pre-anesthesia Checklist: Patient identified, Patient being monitored, Timeout performed, Emergency Drugs available and Suction available ?Patient Re-evaluated:Patient Re-evaluated prior to induction ?Oxygen Delivery Method: Circle system utilized ?Preoxygenation: Pre-oxygenation with 100% oxygen ?Induction Type: IV induction ?Ventilation: Mask ventilation without difficulty ?Laryngoscope Size: 3 and McGraph ?Grade View: Grade I ?Tube type: Oral ?Tube size: 7.0 mm ?Number of attempts: 1 ?Airway Equipment and Method: Stylet ?Placement Confirmation: ETT inserted through vocal cords under direct vision, positive ETCO2 and breath sounds checked- equal and bilateral ?Secured at: 21 cm ?Tube secured with: Tape ?Dental Injury: Teeth and Oropharynx as per pre-operative assessment  ? ? ? ? ?

## 2022-01-07 NOTE — Op Note (Signed)
Indications: Ms. Cribb is a 46 yo female suffering from: ?M54.12- Cervical Radiculopathy ? ?She had ACDF in February. This is a planned 2nd stage of a 2 stage operation. ? ?Findings: foraminal stenosis ? ?Preoperative Diagnosis: M54.12- Cervical Radiculopathy ?Postoperative Diagnosis: same ? ? ?EBL: 50 ml ?IVF: see AR ml ?Drains: none ?Disposition: Extubated and Stable to PACU ?Complications: none ? ?No foley catheter was placed. ? ? ?Preoperative Note:  ? ?Risks of surgery discussed include: infection, bleeding, stroke, coma, death, paralysis, CSF leak, nerve/spinal cord injury, numbness, tingling, weakness, complex regional pain syndrome, recurrent stenosis and/or disc herniation, vascular injury, development of instability, neck/back pain, need for further surgery, persistent symptoms, development of deformity, and the risks of anesthesia. The patient understood these risks and agreed to proceed. ? ?Operative Note:  ? ?OPERATIVE PROCEDURE:  ?1. Posterior Cervical foraminotomies bilaterally C5-6 and C6-7 ?2. Use of flouroscopy ? ? ?OPERATIVE PROCEDURE:  After induction of general anesthesia, the patient was placed in the prone position on the [place] table and open frame.  A midline incision was then planned using fluoroscopy.  A timeout was performed, and antibiotics given. ? ?Next, the posterior cervical region was prepped and draped in the usual sterile fashion. The incision was injected with local anesthetic, the opened sharply. A subperiosteal dissection was then carried out to expose the remaining posterior elements from C5 and C7, with careful attention paid to maintaining the facet capsule.  After satisfactory exposure had been obtained, we turned attention to foraminal decompression.  Start on the left, the high-speed drill was used to make a small laminotomy at C5.  The ligamentum flavum was identified.  This was then followed into the C5-6 facet joint.  Approximately 30 to 40% of the facet was removed  carefully with the drill.  Using an upgoing curette, the ligamentum flavum was detached and the dura identified.  The left C6 nerve root was then identified and followed towards the foramen.  Using a 1 mm punch, the nerve root was fully decompressed until a nerve hook could confirm decompression.  Surgiflo was then placed into the foraminal area for hemostasis and a small patty placed. ? ?We then moved to the left-sided C6-7 area where a similar decompression was performed until the left C7 nerve root was fully decompressed.  I then turned my attention to the right side.  We performed foraminal decompression at C5-6 and C6-7 in similar fashion to that described above until the bilateral C6 and C7 nerve roots were decompressed fully on both sides.  Hemostasis was achieved.  Depo-Medrol was then placed along the nerve roots. ? ? ? ?The wound was copiously irrigated with bacitracin-containing solution and hemostasis was achieved.   ? ?The wound was closed in a multilayer fashion using interrupted 0 and 2-0 Vicryl sutures.  The final skin edges were reapproximated using a 3-0 Vicryl.  Dermabond was placed on the wound for skin closure.  After closure, the patient was flipped supine.  Patient was then handed back over to anesthesia. ?All counts were correct at the conclusion of the procedure. ? ?Cooper Render PA acted as an Pensions consultant throughout the case.  ? ?Meade Maw MD  ?

## 2022-01-07 NOTE — Discharge Instructions (Addendum)
NEUROSURGERY DISCHARGE INSTRUCTIONS ? ?Admission diagnosis: M54.12- Cervical Radiculopathy ? ?Operative procedure: C5-7 decompression ? ?What to do after you leave the hospital: ? ?Recommended diet: regular diet. Increase protein intake to promote wound healing. ? ?Recommended activity: no lifting, driving, or strenuous exercise for 4 weeks .You should walk multiple times per day ? ?Special Instructions ? ?No straining, no heavy lifting > 10lbs x 4 weeks.  Keep incision area clean and dry. May shower in 2 days. No baths or pools for 6 weeks.  ? ?Please remove dressing tomorrow, no need to apply a bandage afterwards ? ?You have no sutures to remove, the skin is closed with adhesive ? ?Please take pain medications as directed. Take a stool softener if on pain medications ? ? ?Please Report any of the following: ?Nausea or Vomiting, Temperature is greater than 101.61F (38.1C) degrees, Dizziness, Abdominal Pain, Difficulty Breathing or Shortness of Breath, Inability to Eat, drink Fluids, or Take medications, Bleeding, swelling, or drainage from surgical incision sites, New numbness or weakness, and Bowel or bladder dysfunction to the neurosurgeon on call at [phone] ? ?Additional Follow up appointments ?Please follow up with Cooper Render PA-C in [facility] as scheduled in 2-3 weeks ? ? ?Please see below for scheduled appointments: ? ?No future appointments. ? ? AMBULATORY SURGERY  ?DISCHARGE INSTRUCTIONS ? ? ?The drugs that you were given will stay in your system until tomorrow so for the next 24 hours you should not: ? ?Drive an automobile ?Make any legal decisions ?Drink any alcoholic beverage ? ? ?You may resume regular meals tomorrow.  Today it is better to start with liquids and gradually work up to solid foods. ? ?You may eat anything you prefer, but it is better to start with liquids, then soup and crackers, and gradually work up to solid foods. ? ? ?Please notify your doctor immediately if you have any  unusual bleeding, trouble breathing, redness and pain at the surgery site, drainage, fever, or pain not relieved by medication. ? ? ? ?Additional Instructions: ? ? ? ? ?Please contact your physician with any problems or Same Day Surgery at [phone], Monday through Friday 6 am to 4 pm, or [place] at [facility] number at [phone].  ?

## 2022-01-07 NOTE — H&P (Signed)
History of Present Illness: ?01/07/2022 ? ?[name] returns today for planned 2nd stage of a 2 stage procedure.  We will proceed with bilateral C5-7 decompression. ? ? ?10/29/2021 ?[name] is here today with a chief complaint of pain in her posterior neck and bilateral shoulders. She also reports tingling, numbness and pulling in both upper arms, and numbness in her hands. Everything is worse on the left. She denies weakness, balance issues, or changes in handwriting. However, over the past 3 days, she has spilled several cups of tea and dropped several glasses. ? ?She began having symptoms in summer 2022 but her symptoms have been worsening over the past 2 months. She reports sharp, throbbing, stiff discomfort primarily into her left trapezius and upper arm, but also other places as described above. Driving, sitting, and turning her head make her pain worse. Tilting her head back makes it worse. Laying on her back with the neck down it makes it better. Medication has made it manageable, but not made her pain go away. She is not certain whether she has weakness, but has been dropping items as discussed above. ?Bowel/Bladder Dysfunction: none ? ?When she extends her neck, she does have electric-like shocks that go down her spine and into her arms. She is even had 1 situation where she had some difficulty moving her arm. ? ?Conservative measures: dry needling ?Physical therapy: has participated 11/28/20-04/26/21 at [facility] for lumbar; currently participating at [facility] for cervical and lumbar ?Multimodal medical therapy including regular antiinflammatories: cyclobenzaprine, methocarbamol, tramadol, prednisone, diclofenac, gabapentin, norco ?Injections: has not received epidural steroid injections ? ?Past Surgery: none ? ?Eileen Chaney has symptoms of cervical myelopathy. ? ?The symptoms are causing a significant impact on the patient's life.  ? ?Review of Systems:  ?A 10  point review of systems is negative, except for the pertinent positives and negatives detailed in the HPI. ? ?Past Medical History: ?Past Medical History:  ?Diagnosis Date  ? Anemia  ? Anxiety  ? Asthma, unspecified asthma severity, unspecified whether complicated, unspecified whether persistent  ? Depression  ? Diabetes mellitus without complication (CMS-HCC)  ? History of abnormal cervical Pap smear 2000  ? Hyperlipidemia  ? Hypertension  ? Osteoarthritis 2013  ? ?Past Surgical History: ?Past Surgical History:  ?Procedure Laterality Date  ? ankle surgery Right 08/2020  ? CESAREAN SECTION  ? HYSTERECTOMY  ? TONSILLECTOMY  ? ? ?Allergies  ?Allergen Reactions  ? Other Hives  ?  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 ? ?Current Meds  ?Medication Sig  ? albuterol (VENTOLIN HFA) 108 (90 Base) MCG/ACT inhaler 1-2 puffs every 4-6 hours as needed for cough or wheezing. Rinse mouth after use  ? atorvastatin (LIPITOR) 40 MG tablet Take 1 tablet (40 mg total) by mouth daily. (Patient taking differently: Take 40 mg by mouth at bedtime.)  ? Baclofen 5 MG TABS Take 5 mg by mouth 3 (three) times daily as needed (spasm).  ? celecoxib (CELEBREX) 100 MG capsule Take 100 mg by mouth 2 (two) times daily as needed for moderate pain.  ? cetirizine (ZYRTEC) 10 MG tablet Take 10 mg by mouth daily.  ? COLOSTRUM PO Take 4 capsules by mouth daily.  ? cyclobenzaprine (FLEXERIL) 10 MG tablet Take 10 mg by mouth 3 (three) times daily as needed for muscle spasms.  ? diclofenac Sodium (VOLTAREN) 1 % GEL Apply 2 g topically daily as needed (pain).  ? Dulaglutide (TRULICITY) 3 MG/8.6PY SOPN Inject  3 mg into the skin every Saturday.  ? EPINEPHrine 0.3 mg/0.3 mL IJ SOAJ injection Inject 0.3 mg into the muscle as needed for anaphylaxis.  ? lisinopril (ZESTRIL) 10 MG tablet Take 1 tablet (10 mg total) by mouth daily.  ? Multiple Vitamin (MULTI-VITAMIN) tablet Take 1 tablet by mouth daily.  ? NALTREXONE HCL PO Take 4.5 mg by mouth daily.  ? sertraline  (ZOLOFT) 100 MG tablet Take 2 tablets (200 mg total) by mouth daily. (Patient taking differently: Take 200 mg by mouth at bedtime.)  ? spironolactone (ALDACTONE) 50 MG tablet Take 50 mg by mouth daily.  ? ? ?Social History: ?Social History  ? ?Tobacco Use  ? Smoking status: Never  ? Smokeless tobacco: Never  ?Vaping Use  ? Vaping Use: Never used  ?Substance Use Topics  ? Alcohol use: Not Currently  ?Alcohol/week: 0.0 standard drinks  ? Drug use: Never  ? ?Family Medical History: ?Family History  ?Problem Relation Age of Onset  ? Breast cancer Mother  ? Rheum arthritis Mother  ? Multiple myeloma Mother  ? Sleep apnea Mother  ? Arthritis Mother  ? Polymyalgia rheumatica Mother  ? Heart disease Father  ? Coronary Artery Disease (Blocked arteries around heart) Father  ? High blood pressure (Hypertension) Father  ? Hyperlipidemia (Elevated cholesterol) Father  ? Sleep apnea Father  ? Diabetes type II Brother  ? Colon cancer Maternal Aunt  ? Diabetes type II Maternal Grandmother  ? ?Physical Examination: ? ?Vitals:  ? 01/07/22 0627  ?BP: (!) 139/93  ?Pulse: (!) 114  ?Resp: 18  ?Temp: 98.1 ?F (36.7 ?C)  ?SpO2: 98%  ? ?Heart sounds normal no MRG. Chest Clear to Auscultation Bilaterally. ? ? ?General: Patient is well developed, well nourished, calm, collected, and in no apparent distress. Attention to examination is appropriate. ? ?Psychiatric: Patient is non-anxious. ? ?Head: Pupils equal, round, and reactive to light. ? ?ENT: Oral mucosa appears well hydrated. ? ?Neck: Supple. Full range of motion with significant discomfort on extension and rotation to the left. ? ?Respiratory: Patient is breathing without any difficulty. ? ?Extremities: No edema. ? ?Vascular: Palpable dorsal pedal pulses. ? ?Skin: On exposed skin, there are no abnormal skin lesions. ? ?NEUROLOGICAL:  ? ?Awake, alert, oriented to person, place, and time. Speech is clear and fluent. Fund of knowledge is appropriate.  ? ?Cranial Nerves: Pupils equal round  and reactive to light. Facial tone is symmetric. Facial sensation is symmetric. Shoulder shrug is symmetric. Tongue protrusion is midline. There is no pronator drift. ? ?Strength: ?Side Biceps Triceps Deltoid Interossei Grip Wrist Ext. Wrist Flex.  ?R _0 ?L 5 4+ 4+ _1 ? ?Side Iliopsoas Quads Hamstring PF DF EHL  ?R _2 ?L _3 ? ?Reflexes are 1+ and symmetric at the biceps, triceps, brachioradialis, patella and achilles. Hoffman's is present.  ?Clonus is not present. Toes are down-going.  ?Bilateral upper and lower extremity sensation is intact to light touch.  ?Gait is slightly wide-based.  ?No evidence of dysmetria noted. ? ?Moderate difficulty with tandem gait.  ? ?Medical Decision Making ? ?Imaging: ?MRI C spine 10/24/2021 ?Broad-based rightward eccentric disc protrusion abuts and effaces the right ventral cord at C4-5 with severe right greater than left neuroforaminal stenosis and severe central stenosis. Severe bilateral neuroforaminal stenosis C3-4. Severe left greater than right neuroforaminal stenosis at C5-6 and C6-7. ? ?I have  personally reviewed the images and agree with the above interpretation. ? ?Assessment and Plan: ?[name] is a pleasant 46 y.o. female with cervical myelopathy with radiculopathy. She has already undergone C3-5 ACDF to address her central stenosis. She has neuroforaminal stenosis at C5-6 and C6-7. ? ?We will proceed with decompression of C5-6 and C6-7 today. ? ? ?Meade Maw MD, MPHS ?Department of Neurosurgery  ?

## 2022-01-07 NOTE — Anesthesia Preprocedure Evaluation (Signed)
Anesthesia Evaluation  ?Patient identified by MRN, date of birth, ID band ?Patient awake ? ? ? ?Reviewed: ?Allergy & Precautions, H&P , NPO status , Patient's Chart, lab work & pertinent test results, reviewed documented beta blocker date and time  ? ?History of Anesthesia Complications ?(+) PONV and history of anesthetic complications ? ?Airway ?Mallampati: III ? ?TM Distance: >3 FB ?Neck ROM: full ? ? ? Dental ? ?(+) Dental Advidsory Given, Caps ?  ?Pulmonary ?neg shortness of breath, asthma , neg sleep apnea, neg COPD, neg recent URI,  ?  ?Pulmonary exam normal ?breath sounds clear to auscultation ? ? ? ? ? ? Cardiovascular ?Exercise Tolerance: Good ?hypertension, Pt. on medications ?(-) angina(-) Past MI and (-) Cardiac Stents Normal cardiovascular exam(-) dysrhythmias (-) Valvular Problems/Murmurs ?Rhythm:regular Rate:Normal ? ? ?  ?Neuro/Psych ?PSYCHIATRIC DISORDERS Anxiety Depression  Neuromuscular disease   ? GI/Hepatic ?negative GI ROS, Neg liver ROS,   ?Endo/Other  ?diabetes, Type 2, Oral Hypoglycemic AgentsMorbid obesity ? Renal/GU ?negative Renal ROS  ?negative genitourinary ?  ?Musculoskeletal ? ?(+) Arthritis ,  ? Abdominal ?  ?Peds ? Hematology ?negative hematology ROS ?(+)   ?Anesthesia Other Findings ?Past Medical History: ?No date: Asthma ?No date: Complication of anesthesia ?No date: Diabetes mellitus without complication ([place]) ?No date: PONV (postoperative nausea and vomiting) ? ? Reproductive/Obstetrics ?negative OB ROS ? ?  ? ? ? ? ? ? ? ? ? ? ? ? ? ?  ?  ? ? ? ? ? ? ? ? ?Anesthesia Physical ? ?Anesthesia Plan ? ?ASA: 3 ? ?Anesthesia Plan: General  ? ?Post-op Pain Management: Ofirmev IV (intra-op)*, Dilaudid IV and Ketamine IV*  ? ?Induction: Intravenous ? ?PONV Risk Score and Plan: 4 or greater and Propofol infusion, TIVA, Midazolam, Ondansetron and Dexamethasone ? ?Airway Management Planned: Oral ETT ? ?Additional Equipment: None ? ?Intra-op Plan:   ? ?Post-operative Plan: Extubation in OR ? ?Informed Consent: I have reviewed the patients History and Physical, chart, labs and discussed the procedure including the risks, benefits and alternatives for the proposed anesthesia with the patient or authorized representative who has indicated his/her understanding and acceptance.  ? ? ? ?Dental Advisory Given ? ?Plan Discussed with: Anesthesiologist, CRNA and Surgeon ? ?Anesthesia Plan Comments: (Discussed risks of anesthesia with patient, including PONV, sore throat, lip/dental/eye damage. Rare risks discussed as well, such as cardiorespiratory and neurological sequelae, and allergic reactions. Discussed the role of CRNA in patient's perioperative care. Patient understands. ?Previous ACDF record reviewed, noted to have required high amount of IV opioids in pacu. Will plan on ketamine intraop.)  ? ? ? ? ? ? ?Anesthesia Quick Evaluation ? ?

## 2022-01-07 NOTE — Addendum Note (Signed)
Addendum  created 01/07/22 1519 by Cammie Sickle, CRNA  ? Flowsheet accepted  ?  ?

## 2022-01-07 NOTE — Progress Notes (Signed)
PHARMACY -  BRIEF ANTIBIOTIC NOTE  ? ?Pharmacy has received consult(s) for Cefazolin from an OR provider.  The patient's profile has been reviewed for ht/wt/allergies/indication/available labs.   ? ?One time order(s) placed for Cefazolin 2 gm once per pt wt < 120 kg. ? ?Further antibiotics/pharmacy consults should be ordered by admitting physician if indicated.       ?                ?Thank you, ?Renda Rolls, PharmD, MBA ?01/07/2022 ?6:19 AM ? ? ?

## 2022-01-07 NOTE — Anesthesia Postprocedure Evaluation (Signed)
Anesthesia Post Note ? ?Patient: Eileen Chaney ? ?Procedure(s) Performed: OPEN C5-7 POSTERIOR DECOMPRESSION (Spine Cervical) ? ?Patient location during evaluation: PACU ?Anesthesia Type: General ?Level of consciousness: awake and alert ?Pain management: pain level controlled ?Vital Signs Assessment: post-procedure vital signs reviewed and stable ?Respiratory status: spontaneous breathing, nonlabored ventilation, respiratory function stable and patient connected to nasal cannula oxygen ?Cardiovascular status: blood pressure returned to baseline and stable ?Postop Assessment: no apparent nausea or vomiting ?Anesthetic complications: no ? ? ?No notable events documented. ? ? ?Last Vitals:  ?Vitals:  ? 01/07/22 1200 01/07/22 1217  ?BP: 112/75 113/72  ?Pulse: 88 90  ?Resp: 11 18  ?Temp: 36.7 ?C (!) 36.2 ?C  ?SpO2: 95% 97%  ?  ?Last Pain:  ?Vitals:  ? 01/07/22 1217  ?TempSrc: Temporal  ?PainSc: 4   ? ? ?  ?  ?  ?  ?  ?  ? ?Arita Miss ? ? ? ? ?

## 2022-01-07 NOTE — Discharge Summary (Signed)
Physician Discharge Summary  ?Patient ID: ?[place] ?MRN: [id number] ?DOB/AGE: [birth year] 46 y.o. ? ?Admit date: 01/07/2022 ?Discharge date: 01/07/2022 ? ?Admission Diagnoses: cervical radiculopathy  ? ?Discharge Diagnoses:  ?Active Problems: ?  * No active hospital problems. * ? ? ?Discharged Condition: good ? ?Hospital Course:  ?Eileen Chaney is a 46 y.o s/p C5-7 decompression. Her interoperative course was uncomplicated. She was monitored in PACU and discharged home after ambulating, urinating, and tolerating PO intake.  ? ?Consults: None ? ?Significant Diagnostic Studies: none  ? ?Treatments: surgery: as above. Please see separately dictated operative report for further details ? ?Discharge Exam: ?Blood pressure (!) 139/93, pulse (!) 114, temperature 98.1 ?F (36.7 ?C), temperature source Temporal, resp. rate 18, height _0  (1.727 m), weight 116.6 kg, SpO2 98 %. ? ?CN II- XII grossly intact ?5/5 strength bilateral upper extremities ? ?Disposition: Discharge disposition: 01-Home or Self Care ? ? ? ? ? ? ?Discharge Instructions   ? ? Diet - low sodium heart healthy   Complete by: As directed ?  ? Increase activity slowly   Complete by: As directed ?  ? ?  ? ?Allergies as of 01/07/2022   ? ?   Reactions  ? Other Hives  ? 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 ?  ? ?  ?Medication List  ?  ? ?TAKE these medications   ? ?albuterol 108 (90 Base) MCG/ACT inhaler ?Commonly known as: VENTOLIN HFA ?1-2 puffs every 4-6 hours as needed for cough or wheezing. Rinse mouth after use ?  ?atorvastatin 40 MG tablet ?Commonly known as: LIPITOR ?Take 1 tablet (40 mg total) by mouth daily. ?What changed: when to take this ?  ?Baclofen 5 MG Tabs ?Take 5 mg by mouth 3 (three) times daily as needed (spasm). ?  ?celecoxib 100 MG capsule ?Commonly known as: CELEBREX ?Take 100 mg by mouth 2 (two) times daily as needed for moderate pain. ?  ?cetirizine 10 MG tablet ?Commonly known as: ZYRTEC ?Take 10 mg by mouth daily. ?   ?COLOSTRUM PO ?Take 4 capsules by mouth daily. ?  ?Contour Next Monitor w/Device Kit ?See admin instructions. ?  ?Contour Next Test test strip ?Generic drug: glucose blood ?1 each 3 (three) times daily. ?  ?cyclobenzaprine 10 MG tablet ?Commonly known as: FLEXERIL ?Take 10 mg by mouth 3 (three) times daily as needed for muscle spasms. ?  ?diclofenac Sodium 1 % Gel ?Commonly known as: VOLTAREN ?Apply 2 g topically daily as needed (pain). ?  ?EPINEPHrine 0.3 mg/0.3 mL Soaj injection ?Commonly known as: EPI-PEN ?Inject 0.3 mg into the muscle as needed for anaphylaxis. ?  ?lisinopril 10 MG tablet ?Commonly known as: ZESTRIL ?Take 1 tablet (10 mg total) by mouth daily. ?  ?Multi-Vitamin tablet ?Take 1 tablet by mouth daily. ?  ?NALTREXONE HCL PO ?Take 4.5 mg by mouth daily. ?  ?oxyCODONE-acetaminophen 5-325 MG tablet ?Commonly known as: Percocet ?Take 1 tablet by mouth every 4 (four) hours as needed for up to 5 days for severe pain. ?  ?sertraline 100 MG tablet ?Commonly known as: ZOLOFT ?Take 2 tablets (200 mg total) by mouth daily. ?What changed: when to take this ?  ?spironolactone 50 MG tablet ?Commonly known as: ALDACTONE ?Take 50 mg by mouth daily. ?  ?Trulicity 3 AC/1.6SA Sopn ?Generic drug: Dulaglutide ?Inject 3 mg into the skin every Saturday. ?  ? ?  ? ? Follow-up Information   ? ? Loleta Dicker, PA Follow up in 2 week(s).   ?  Why: For incision check and post-op follow up. This appointment date and time should be on your pre-op paperwork ?Contact information: ?[place] [address] ?[phone] ? ? ?  ?  ? ?  ?  ? ?  ? ? ?Signed: ?Loleta Dicker ?01/07/2022, 10:12 AM ? ? ?

## 2022-01-07 NOTE — Transfer of Care (Signed)
Immediate Anesthesia Transfer of Care Note ? ?Patient: Eileen Chaney ? ?Procedure(s) Performed: OPEN C5-7 POSTERIOR DECOMPRESSION (Spine Cervical) ? ?Patient Location: PACU ? ?Anesthesia Type:General ? ?Level of Consciousness: drowsy ? ?Airway & Oxygen Therapy: Patient Spontanous Breathing and Patient connected to face mask oxygen ? ?Post-op Assessment: Report given to RN and Post -op Vital signs reviewed and stable ? ?Post vital signs: Reviewed and stable ? ?Last Vitals:  ?Vitals Value Taken Time  ?BP 108/65 01/07/22 1016  ?Temp 36.3 ?C 01/07/22 1016  ?Pulse 103 01/07/22 1023  ?Resp 13 01/07/22 1023  ?SpO2 100 % 01/07/22 1023  ?Vitals shown include unvalidated device data. ? ?Last Pain:  ?Vitals:  ? 01/07/22 1016  ?TempSrc:   ?PainSc: Asleep  ?   ? ?  ? ?Complications: No notable events documented. ?

## 2022-01-08 ENCOUNTER — Encounter: Payer: Self-pay | Admitting: Neurosurgery

## 2022-01-20 ENCOUNTER — Other Ambulatory Visit: Payer: Self-pay

## 2022-01-20 ENCOUNTER — Encounter
Admission: EM | Disposition: A | Discharge status: Home Health | Payer: Self-pay | Source: Home / Self Care | Attending: Neurosurgery

## 2022-01-20 ENCOUNTER — Observation Stay: Payer: BC Managed Care – PPO

## 2022-01-20 ENCOUNTER — Observation Stay: Payer: BC Managed Care – PPO | Admitting: Anesthesiology

## 2022-01-20 ENCOUNTER — Inpatient Hospital Stay
Admission: EM | Admit: 2022-01-20 | Discharge: 2022-01-24 | DRG: 858 | Disposition: A | Discharge status: Home Health | Payer: BC Managed Care – PPO | Attending: Neurosurgery | Admitting: Neurosurgery

## 2022-01-20 DIAGNOSIS — Z7984 Long term (current) use of oral hypoglycemic drugs: Secondary | ICD-10-CM

## 2022-01-20 DIAGNOSIS — F419 Anxiety disorder, unspecified: Secondary | ICD-10-CM | POA: Diagnosis present

## 2022-01-20 DIAGNOSIS — J45909 Unspecified asthma, uncomplicated: Secondary | ICD-10-CM | POA: Diagnosis present

## 2022-01-20 DIAGNOSIS — Z6839 Body mass index (BMI) 39.0-39.9, adult: Secondary | ICD-10-CM

## 2022-01-20 DIAGNOSIS — T8149XA Infection following a procedure, other surgical site, initial encounter: Secondary | ICD-10-CM

## 2022-01-20 DIAGNOSIS — E785 Hyperlipidemia, unspecified: Secondary | ICD-10-CM | POA: Diagnosis present

## 2022-01-20 DIAGNOSIS — M5030 Other cervical disc degeneration, unspecified cervical region: Secondary | ICD-10-CM | POA: Diagnosis present

## 2022-01-20 DIAGNOSIS — B964 Proteus (mirabilis) (morganii) as the cause of diseases classified elsewhere: Secondary | ICD-10-CM | POA: Diagnosis present

## 2022-01-20 DIAGNOSIS — Y838 Other surgical procedures as the cause of abnormal reaction of the patient, or of later complication, without mention of misadventure at the time of the procedure: Secondary | ICD-10-CM | POA: Diagnosis present

## 2022-01-20 DIAGNOSIS — Z885 Allergy status to narcotic agent status: Secondary | ICD-10-CM

## 2022-01-20 DIAGNOSIS — M1990 Unspecified osteoarthritis, unspecified site: Secondary | ICD-10-CM | POA: Diagnosis present

## 2022-01-20 DIAGNOSIS — T8141XA Infection following a procedure, superficial incisional surgical site, initial encounter: Principal | ICD-10-CM | POA: Diagnosis present

## 2022-01-20 DIAGNOSIS — E669 Obesity, unspecified: Secondary | ICD-10-CM | POA: Diagnosis present

## 2022-01-20 DIAGNOSIS — F32A Depression, unspecified: Secondary | ICD-10-CM | POA: Diagnosis present

## 2022-01-20 DIAGNOSIS — Z79899 Other long term (current) drug therapy: Secondary | ICD-10-CM

## 2022-01-20 DIAGNOSIS — Z886 Allergy status to analgesic agent status: Secondary | ICD-10-CM

## 2022-01-20 DIAGNOSIS — I10 Essential (primary) hypertension: Secondary | ICD-10-CM | POA: Diagnosis present

## 2022-01-20 DIAGNOSIS — T8140XA Infection following a procedure, unspecified, initial encounter: Principal | ICD-10-CM

## 2022-01-20 DIAGNOSIS — L298 Other pruritus: Secondary | ICD-10-CM | POA: Diagnosis not present

## 2022-01-20 DIAGNOSIS — R5381 Other malaise: Secondary | ICD-10-CM | POA: Diagnosis present

## 2022-01-20 DIAGNOSIS — E119 Type 2 diabetes mellitus without complications: Secondary | ICD-10-CM | POA: Diagnosis present

## 2022-01-20 HISTORY — PX: CERVICAL WOUND DEBRIDEMENT: SHX6694

## 2022-01-20 LAB — CBC
HCT: 37.8 % (ref 36.0–46.0)
Hemoglobin: 11.8 g/dL — ABNORMAL LOW (ref 12.0–15.0)
MCH: 25.5 pg — ABNORMAL LOW (ref 26.0–34.0)
MCHC: 31.2 g/dL (ref 30.0–36.0)
MCV: 81.8 fL (ref 80.0–100.0)
Platelets: 283 10*3/uL (ref 150–400)
RBC: 4.62 MIL/uL (ref 3.87–5.11)
RDW: 15 % (ref 11.5–15.5)
WBC: 8.4 10*3/uL (ref 4.0–10.5)
nRBC: 0 % (ref 0.0–0.2)

## 2022-01-20 LAB — BASIC METABOLIC PANEL
Anion gap: 7 (ref 5–15)
BUN: 8 mg/dL (ref 6–20)
CO2: 24 mmol/L (ref 22–32)
Calcium: 9 mg/dL (ref 8.9–10.3)
Chloride: 103 mmol/L (ref 98–111)
Creatinine, Ser: 0.71 mg/dL (ref 0.44–1.00)
GFR, Estimated: >60 mL/min (ref >60)
Glucose, Bld: 116 mg/dL — ABNORMAL HIGH (ref 70–99)
Potassium: 3.9 mmol/L (ref 3.5–5.1)
Sodium: 134 mmol/L — ABNORMAL LOW (ref 135–145)

## 2022-01-20 LAB — PROTIME-INR
INR: 1.1 (ref 0.8–1.2)
Prothrombin Time: 14.3 seconds (ref 11.4–15.2)

## 2022-01-20 LAB — CBG MONITORING, ED: Glucose-Capillary: 131 mg/dL — ABNORMAL HIGH (ref 70–99)

## 2022-01-20 LAB — GLUCOSE, CAPILLARY
Glucose-Capillary: 119 mg/dL — ABNORMAL HIGH (ref 70–99)
Glucose-Capillary: 235 mg/dL — ABNORMAL HIGH (ref 70–99)

## 2022-01-20 LAB — C-REACTIVE PROTEIN: CRP: 4.9 mg/dL — ABNORMAL HIGH (ref <1.0)

## 2022-01-20 LAB — SEDIMENTATION RATE: Sed Rate: 71 mm/hr — ABNORMAL HIGH (ref 0–20)

## 2022-01-20 SURGERY — CERVICAL WOUND DEBRIDEMENT
Anesthesia: General

## 2022-01-20 MED ORDER — VANCOMYCIN HCL IN DEXTROSE 1-5 GM/200ML-% IV SOLN
INTRAVENOUS | Status: AC
  Filled 2022-01-20: qty 200

## 2022-01-20 MED ORDER — SODIUM CHLORIDE 0.9 % IV SOLN
2.0000 g | Freq: Three times a day (TID) | INTRAVENOUS | Status: DC
  Administered 2022-01-20 – 2022-01-24 (×11): 2 g via INTRAVENOUS
  Filled 2022-01-20 (×7): qty 12.5
  Filled 2022-01-20 (×2): qty 2
  Filled 2022-01-20 (×3): qty 12.5

## 2022-01-20 MED ORDER — METHOCARBAMOL 1000 MG/10ML IJ SOLN
500.0000 mg | Freq: Once | INTRAVENOUS | Status: AC
  Administered 2022-01-20: 500 mg via INTRAVENOUS
  Filled 2022-01-20: qty 5

## 2022-01-20 MED ORDER — SODIUM CHLORIDE 0.9 % IV SOLN: 1.0000 g | Freq: Three times a day (TID) | INTRAVENOUS | Status: DC

## 2022-01-20 MED ORDER — KETOROLAC TROMETHAMINE 15 MG/ML IJ SOLN
INTRAMUSCULAR | Status: AC
  Filled 2022-01-20: qty 1

## 2022-01-20 MED ORDER — HYDROMORPHONE HCL 1 MG/ML IJ SOLN
0.2500 mg | INTRAMUSCULAR | Status: AC | PRN
  Administered 2022-01-20 (×3): 0.25 mg via INTRAVENOUS

## 2022-01-20 MED ORDER — METHOCARBAMOL 500 MG PO TABS
750.0000 mg | ORAL_TABLET | Freq: Three times a day (TID) | ORAL | Status: DC
  Administered 2022-01-20 – 2022-01-23 (×10): 750 mg via ORAL
  Filled 2022-01-20 (×10): qty 2

## 2022-01-20 MED ORDER — DIPHENHYDRAMINE HCL 50 MG/ML IJ SOLN
25.0000 mg | Freq: Once | INTRAMUSCULAR | Status: AC
  Administered 2022-01-20: 25 mg via INTRAVENOUS
  Filled 2022-01-20: qty 1

## 2022-01-20 MED ORDER — ACETAMINOPHEN 500 MG PO TABS
1000.0000 mg | ORAL_TABLET | Freq: Four times a day (QID) | ORAL | Status: DC
Start: 2022-01-20 — End: 2022-01-21
  Administered 2022-01-21 (×3): 1000 mg via ORAL
  Filled 2022-01-20 (×3): qty 2

## 2022-01-20 MED ORDER — ATORVASTATIN CALCIUM 20 MG PO TABS
40.0000 mg | ORAL_TABLET | Freq: Every day | ORAL | Status: DC
  Administered 2022-01-20 – 2022-01-23 (×4): 40 mg via ORAL
  Filled 2022-01-20 (×4): qty 2

## 2022-01-20 MED ORDER — PROMETHAZINE HCL 25 MG/ML IJ SOLN: 6.2500 mg | INTRAMUSCULAR | Status: DC | PRN

## 2022-01-20 MED ORDER — LORATADINE 10 MG PO TABS
10.0000 mg | ORAL_TABLET | Freq: Every day | ORAL | Status: DC
  Administered 2022-01-21 – 2022-01-24 (×4): 10 mg via ORAL
  Filled 2022-01-20 (×4): qty 1

## 2022-01-20 MED ORDER — DIPHENHYDRAMINE HCL 50 MG/ML IJ SOLN
INTRAMUSCULAR | Status: AC
  Filled 2022-01-20: qty 1

## 2022-01-20 MED ORDER — ONDANSETRON HCL 4 MG PO TABS: 4.0000 mg | ORAL_TABLET | Freq: Four times a day (QID) | ORAL | Status: DC | PRN

## 2022-01-20 MED ORDER — ROCURONIUM BROMIDE 100 MG/10ML IV SOLN
INTRAVENOUS | Status: DC | PRN
  Administered 2022-01-20: 40 mg via INTRAVENOUS

## 2022-01-20 MED ORDER — FENTANYL CITRATE (PF) 100 MCG/2ML IJ SOLN
25.0000 ug | INTRAMUSCULAR | Status: DC | PRN
  Administered 2022-01-20 (×2): 50 ug via INTRAVENOUS

## 2022-01-20 MED ORDER — CELECOXIB 100 MG PO CAPS
100.0000 mg | ORAL_CAPSULE | Freq: Two times a day (BID) | ORAL | Status: DC | PRN
  Administered 2022-01-21 – 2022-01-22 (×3): 100 mg via ORAL
  Filled 2022-01-20 (×5): qty 1

## 2022-01-20 MED ORDER — FENTANYL CITRATE (PF) 100 MCG/2ML IJ SOLN
INTRAMUSCULAR | Status: AC
  Administered 2022-01-20: 50 ug via INTRAVENOUS
  Filled 2022-01-20: qty 2

## 2022-01-20 MED ORDER — DROPERIDOL 2.5 MG/ML IJ SOLN: 0.6250 mg | Freq: Once | INTRAMUSCULAR | Status: DC | PRN

## 2022-01-20 MED ORDER — SODIUM CHLORIDE 0.9 % IR SOLN
Status: DC | PRN
Start: 2022-01-20 — End: 2022-01-20
  Administered 2022-01-20: 500 mL

## 2022-01-20 MED ORDER — MIDAZOLAM HCL 2 MG/2ML IJ SOLN
INTRAMUSCULAR | Status: DC | PRN
  Administered 2022-01-20: 2 mg via INTRAVENOUS

## 2022-01-20 MED ORDER — ONDANSETRON HCL 4 MG/2ML IJ SOLN: 4.0000 mg | Freq: Four times a day (QID) | INTRAMUSCULAR | Status: DC | PRN

## 2022-01-20 MED ORDER — PROPOFOL 10 MG/ML IV BOLUS
INTRAVENOUS | Status: DC | PRN
  Administered 2022-01-20: 50 mg via INTRAVENOUS
  Administered 2022-01-20: 150 mg via INTRAVENOUS
  Administered 2022-01-20: 50 mg via INTRAVENOUS

## 2022-01-20 MED ORDER — FENTANYL CITRATE (PF) 100 MCG/2ML IJ SOLN
INTRAMUSCULAR | Status: AC
  Filled 2022-01-20: qty 2

## 2022-01-20 MED ORDER — KETOROLAC TROMETHAMINE 15 MG/ML IJ SOLN
15.0000 mg | Freq: Once | INTRAMUSCULAR | Status: AC
  Administered 2022-01-20: 15 mg via INTRAVENOUS

## 2022-01-20 MED ORDER — OXYCODONE HCL 5 MG PO TABS
ORAL_TABLET | ORAL | Status: AC
  Administered 2022-01-20: 5 mg via ORAL
  Filled 2022-01-20: qty 1

## 2022-01-20 MED ORDER — DIPHENHYDRAMINE HCL 25 MG PO CAPS
25.0000 mg | ORAL_CAPSULE | Freq: Four times a day (QID) | ORAL | Status: DC | PRN
  Administered 2022-01-21 (×2): 25 mg via ORAL
  Filled 2022-01-20 (×2): qty 1

## 2022-01-20 MED ORDER — OXYCODONE HCL 5 MG PO TABS
5.0000 mg | ORAL_TABLET | ORAL | Status: DC | PRN
  Administered 2022-01-21 – 2022-01-23 (×6): 10 mg via ORAL
  Administered 2022-01-23: 5 mg via ORAL
  Administered 2022-01-23 – 2022-01-24 (×2): 10 mg via ORAL
  Filled 2022-01-20 (×8): qty 2
  Filled 2022-01-20: qty 1

## 2022-01-20 MED ORDER — SODIUM CHLORIDE 0.9 % IV BOLUS
1000.0000 mL | Freq: Once | INTRAVENOUS | Status: AC
  Administered 2022-01-20: 1000 mL via INTRAVENOUS

## 2022-01-20 MED ORDER — DEXAMETHASONE SODIUM PHOSPHATE 10 MG/ML IJ SOLN
INTRAMUSCULAR | Status: DC | PRN
Start: 2022-01-20 — End: 2022-01-20
  Administered 2022-01-20: 5 mg via INTRAVENOUS

## 2022-01-20 MED ORDER — ONDANSETRON HCL 4 MG/2ML IJ SOLN
INTRAMUSCULAR | Status: DC | PRN
  Administered 2022-01-20: 4 mg via INTRAVENOUS

## 2022-01-20 MED ORDER — SODIUM CHLORIDE 0.9 % IV SOLN: INTRAVENOUS | Status: DC

## 2022-01-20 MED ORDER — SERTRALINE HCL 50 MG PO TABS
200.0000 mg | ORAL_TABLET | Freq: Every day | ORAL | Status: DC
  Administered 2022-01-20 – 2022-01-23 (×4): 200 mg via ORAL
  Filled 2022-01-20 (×4): qty 4

## 2022-01-20 MED ORDER — MIDAZOLAM HCL 2 MG/2ML IJ SOLN
INTRAMUSCULAR | Status: AC
  Filled 2022-01-20: qty 2

## 2022-01-20 MED ORDER — PROPOFOL 10 MG/ML IV BOLUS
INTRAVENOUS | Status: AC
  Filled 2022-01-20: qty 40

## 2022-01-20 MED ORDER — LACTATED RINGERS IV SOLN: INTRAVENOUS | Status: DC | PRN

## 2022-01-20 MED ORDER — ALBUTEROL SULFATE (2.5 MG/3ML) 0.083% IN NEBU: 2.5000 mg | INHALATION_SOLUTION | RESPIRATORY_TRACT | Status: DC | PRN

## 2022-01-20 MED ORDER — SODIUM CHLORIDE 0.9% FLUSH: 3.0000 mL | INTRAVENOUS | Status: DC | PRN

## 2022-01-20 MED ORDER — ALBUTEROL SULFATE HFA 108 (90 BASE) MCG/ACT IN AERS: 1.0000 | INHALATION_SPRAY | RESPIRATORY_TRACT | Status: DC | PRN

## 2022-01-20 MED ORDER — VANCOMYCIN HCL 1000 MG IV SOLR
INTRAVENOUS | Status: DC | PRN
  Administered 2022-01-20: 1000 mg via TOPICAL

## 2022-01-20 MED ORDER — SUGAMMADEX SODIUM 200 MG/2ML IV SOLN
INTRAVENOUS | Status: DC | PRN
  Administered 2022-01-20: 200 mg via INTRAVENOUS

## 2022-01-20 MED ORDER — HYDROMORPHONE HCL 1 MG/ML IJ SOLN
0.5000 mg | Freq: Once | INTRAMUSCULAR | Status: AC
  Administered 2022-01-20: 0.5 mg via INTRAVENOUS
  Filled 2022-01-20: qty 1

## 2022-01-20 MED ORDER — SODIUM CHLORIDE 0.9 % IV SOLN
INTRAVENOUS | Status: DC | PRN
  Administered 2022-01-20: 1000 mg via INTRAVENOUS

## 2022-01-20 MED ORDER — ACETAMINOPHEN 10 MG/ML IV SOLN: 1000.0000 mg | Freq: Once | INTRAVENOUS | Status: DC | PRN

## 2022-01-20 MED ORDER — SODIUM CHLORIDE 0.9% FLUSH
3.0000 mL | Freq: Two times a day (BID) | INTRAVENOUS | Status: DC
  Administered 2022-01-20 – 2022-01-24 (×5): 3 mL via INTRAVENOUS

## 2022-01-20 MED ORDER — OXYCODONE HCL 5 MG PO TABS: 5.0000 mg | ORAL_TABLET | Freq: Once | ORAL | Status: AC | PRN

## 2022-01-20 MED ORDER — SODIUM CHLORIDE 0.9 % IV SOLN: 1.0000 g | Freq: Once | INTRAVENOUS | Status: DC

## 2022-01-20 MED ORDER — KETOROLAC TROMETHAMINE 15 MG/ML IJ SOLN
15.0000 mg | Freq: Four times a day (QID) | INTRAMUSCULAR | Status: AC
Start: 2022-01-20 — End: 2022-01-22
  Administered 2022-01-20 – 2022-01-22 (×8): 15 mg via INTRAVENOUS
  Filled 2022-01-20 (×8): qty 1

## 2022-01-20 MED ORDER — LISINOPRIL 10 MG PO TABS
10.0000 mg | ORAL_TABLET | Freq: Every day | ORAL | Status: DC
  Administered 2022-01-24: 10 mg via ORAL
  Filled 2022-01-20 (×2): qty 1

## 2022-01-20 MED ORDER — DIPHENHYDRAMINE HCL 50 MG/ML IJ SOLN
25.0000 mg | Freq: Once | INTRAMUSCULAR | Status: AC
  Administered 2022-01-20: 25 mg via INTRAVENOUS

## 2022-01-20 MED ORDER — SPIRONOLACTONE 25 MG PO TABS
50.0000 mg | ORAL_TABLET | Freq: Every day | ORAL | Status: DC
  Administered 2022-01-21 – 2022-01-24 (×4): 50 mg via ORAL
  Filled 2022-01-20 (×4): qty 2

## 2022-01-20 MED ORDER — SURGIRINSE WOUND IRRIGATION SYSTEM - OPTIME
TOPICAL | Status: DC | PRN
  Administered 2022-01-20: 450 mL via TOPICAL

## 2022-01-20 MED ORDER — OXYCODONE HCL 5 MG/5ML PO SOLN: 5.0000 mg | Freq: Once | ORAL | Status: AC | PRN

## 2022-01-20 MED ORDER — FENTANYL CITRATE (PF) 100 MCG/2ML IJ SOLN
INTRAMUSCULAR | Status: DC | PRN
  Administered 2022-01-20: 100 ug via INTRAVENOUS
  Administered 2022-01-20 (×2): 50 ug via INTRAVENOUS

## 2022-01-20 MED ORDER — HYDROMORPHONE HCL 1 MG/ML IJ SOLN
INTRAMUSCULAR | Status: AC
  Administered 2022-01-20: 0.25 mg via INTRAVENOUS
  Filled 2022-01-20: qty 1

## 2022-01-20 MED ORDER — SODIUM CHLORIDE 0.9 % IV SOLN: 250.0000 mL | INTRAVENOUS | Status: DC

## 2022-01-20 MED ORDER — PHENOL 1.4 % MT LIQD
1.0000 | OROMUCOSAL | Status: DC | PRN
  Filled 2022-01-20: qty 177

## 2022-01-20 MED ORDER — ACETAMINOPHEN 10 MG/ML IV SOLN
INTRAVENOUS | Status: AC
  Administered 2022-01-20: 1000 mg via INTRAVENOUS
  Filled 2022-01-20: qty 100

## 2022-01-20 MED ORDER — VANCOMYCIN HCL IN DEXTROSE 1-5 GM/200ML-% IV SOLN
1000.0000 mg | Freq: Two times a day (BID) | INTRAVENOUS | Status: AC
  Administered 2022-01-20 – 2022-01-22 (×5): 1000 mg via INTRAVENOUS
  Filled 2022-01-20 (×5): qty 200

## 2022-01-20 MED ORDER — HYDROMORPHONE HCL 1 MG/ML IJ SOLN
0.5000 mg | INTRAMUSCULAR | Status: DC | PRN
  Administered 2022-01-20 – 2022-01-24 (×7): 0.5 mg via INTRAVENOUS
  Filled 2022-01-20 (×7): qty 1

## 2022-01-20 MED ORDER — PHENYLEPHRINE HCL (PRESSORS) 10 MG/ML IV SOLN
INTRAVENOUS | Status: DC | PRN
  Administered 2022-01-20 (×5): 160 ug via INTRAVENOUS

## 2022-01-20 MED ORDER — INSULIN ASPART 100 UNIT/ML IJ SOLN
0.0000 [IU] | Freq: Every day | INTRAMUSCULAR | Status: DC
  Administered 2022-01-20: 2 [IU] via SUBCUTANEOUS
  Filled 2022-01-20: qty 1

## 2022-01-20 MED ORDER — SODIUM CHLORIDE 0.9 % IV SOLN
2.0000 g | Freq: Once | INTRAVENOUS | Status: AC
  Administered 2022-01-20: 2 g via INTRAVENOUS
  Filled 2022-01-20: qty 12.5

## 2022-01-20 MED ORDER — LIDOCAINE HCL (CARDIAC) PF 100 MG/5ML IV SOSY
PREFILLED_SYRINGE | INTRAVENOUS | Status: DC | PRN
Start: 2022-01-20 — End: 2022-01-20
  Administered 2022-01-20: 100 mg via INTRAVENOUS

## 2022-01-20 MED ORDER — POTASSIUM CHLORIDE IN NACL 20-0.9 MEQ/L-% IV SOLN
INTRAVENOUS | Status: DC
  Filled 2022-01-20 (×11): qty 1000

## 2022-01-20 MED ORDER — INSULIN ASPART 100 UNIT/ML IJ SOLN
0.0000 [IU] | Freq: Three times a day (TID) | INTRAMUSCULAR | Status: DC
  Administered 2022-01-21: 2 [IU] via SUBCUTANEOUS
  Administered 2022-01-21: 3 [IU] via SUBCUTANEOUS
  Administered 2022-01-21 – 2022-01-22 (×3): 2 [IU] via SUBCUTANEOUS
  Filled 2022-01-20 (×5): qty 1

## 2022-01-20 MED ORDER — MENTHOL 3 MG MT LOZG
1.0000 | LOZENGE | OROMUCOSAL | Status: DC | PRN
  Filled 2022-01-20: qty 9

## 2022-01-20 SURGICAL SUPPLY — 59 items
CHLORAPREP W/TINT 26 (MISCELLANEOUS) ×3 IMPLANT
CNTNR SPEC 2.5X3XGRAD LEK (MISCELLANEOUS) ×1
CONT SPEC 4OZ STER OR WHT (MISCELLANEOUS) ×1
CONTAINER SPEC 2.5X3XGRAD LEK (MISCELLANEOUS) IMPLANT
COUNTER NEEDLE 20/40 LG (NEEDLE) ×1 IMPLANT
DERMABOND ADVANCED (GAUZE/BANDAGES/DRESSINGS)
DERMABOND ADVANCED .7 DNX12 (GAUZE/BANDAGES/DRESSINGS) ×1 IMPLANT
DRAIN CHANNEL JP 10F RND 20C F (MISCELLANEOUS) IMPLANT
DRAPE C ARM PK CFD 31 SPINE (DRAPES) ×2 IMPLANT
DRAPE LAPAROTOMY 100X77 ABD (DRAPES) ×2 IMPLANT
DRAPE SURG 17X11 SM STRL (DRAPES) ×8 IMPLANT
DRSG OPSITE POSTOP 3X4 (GAUZE/BANDAGES/DRESSINGS) ×1 IMPLANT
ELECT CAUTERY BLADE TIP 2.5 (TIP)
ELECTRODE CAUTERY BLDE TIP 2.5 (TIP) IMPLANT
FEE INTRAOP CADWELL SUPPLY NCS (MISCELLANEOUS) ×1 IMPLANT
FEE INTRAOP MONITOR IMPULS NCS (MISCELLANEOUS) IMPLANT
GAUZE 4X4 16PLY ~~LOC~~+RFID DBL (SPONGE) ×2 IMPLANT
GAUZE XEROFORM 4X4 STRL (GAUZE/BANDAGES/DRESSINGS) ×1 IMPLANT
GLOVE BIOGEL PI IND STRL 6.5 (GLOVE) ×1 IMPLANT
GLOVE BIOGEL PI INDICATOR 6.5 (GLOVE)
GLOVE SURG SYN 6.5 ES PF (GLOVE) IMPLANT
GLOVE SURG SYN 6.5 PF PI (GLOVE) ×1 IMPLANT
GLOVE SURG SYN 8.5  E (GLOVE) ×2
GLOVE SURG SYN 8.5 E (GLOVE) ×2 IMPLANT
GLOVE SURG SYN 8.5 PF PI (GLOVE) ×3 IMPLANT
GLOVE SURG UNDER POLY LF SZ8.5 (GLOVE) ×2 IMPLANT
GOWN SRG LRG LVL 4 IMPRV REINF (GOWNS) ×1 IMPLANT
GOWN SRG XL LVL 3 NONREINFORCE (GOWNS) ×1 IMPLANT
GOWN STRL NON-REIN TWL XL LVL3 (GOWNS) ×1
GOWN STRL REIN LRG LVL4 (GOWNS)
GRADUATE 1200CC STRL 31836 (MISCELLANEOUS) ×2 IMPLANT
HEMOVAC 400CC 10FR (MISCELLANEOUS) ×1 IMPLANT
INTRAOP CADWELL SUPPLY FEE NCS (MISCELLANEOUS) ×1
INTRAOP DISP SUPPLY FEE NCS (MISCELLANEOUS) ×1
INTRAOP MONITOR FEE IMPULS NCS (MISCELLANEOUS)
INTRAOP MONITOR FEE IMPULSE (MISCELLANEOUS)
KIT PREVENA INCISION MGT 13 (CANNISTER) ×1 IMPLANT
KIT TURNOVER KIT A (KITS) ×2 IMPLANT
MANIFOLD NEPTUNE II (INSTRUMENTS) ×2 IMPLANT
MARKER SKIN DUAL TIP RULER LAB (MISCELLANEOUS) ×2 IMPLANT
NDL SAFETY ECLIPSE 18X1.5 (NEEDLE) ×1 IMPLANT
NEEDLE HYPO 18GX1.5 SHARP (NEEDLE)
NEEDLE HYPO 22GX1.5 SAFETY (NEEDLE) ×2 IMPLANT
NS IRRIG 1000ML POUR BTL (IV SOLUTION) ×2 IMPLANT
PACK LAMINECTOMY NEURO (CUSTOM PROCEDURE TRAY) ×2 IMPLANT
PAD ARMBOARD 7.5X6 YLW CONV (MISCELLANEOUS) ×4 IMPLANT
PIN MAYFIELD SKULL DISP (PIN) ×1 IMPLANT
STAPLER SKIN PROX 35W (STAPLE) ×4 IMPLANT
SURGIFLO W/THROMBIN 8M KIT (HEMOSTASIS) ×1 IMPLANT
SUT ETHILON 3-0 FS-10 30 BLK (SUTURE) ×2
SUT V-LOC 90 ABS DVC 3-0 CL (SUTURE) ×1 IMPLANT
SUT VIC AB 0 CT1 27 (SUTURE) ×1
SUT VIC AB 0 CT1 27XCR 8 STRN (SUTURE) IMPLANT
SUT VIC AB 2-0 CT1 18 (SUTURE) ×2 IMPLANT
SUTURE EHLN 3-0 FS-10 30 BLK (SUTURE) IMPLANT
TAPE CLOTH 3X10 WHT NS LF (GAUZE/BANDAGES/DRESSINGS) ×3 IMPLANT
TOWEL OR 17X26 4PK STRL BLUE (TOWEL DISPOSABLE) ×7 IMPLANT
TRAY FOLEY MTR SLVR 16FR STAT (SET/KITS/TRAYS/PACK) IMPLANT
WATER STERILE IRR 500ML POUR (IV SOLUTION) ×2 IMPLANT

## 2022-01-20 NOTE — Consult Note (Signed)
Pharmacy Antibiotic Note ? ?Eileen Chaney is a 46 y.o. female admitted on 01/20/2022 with  posterior cervical surgical wound after cervical decompression on 01/07/22 .  Patient to OR on 4/23 for irrigation and debridement as well as the addition of vancomycin powder to the deep space. Pharmacy has been consulted for vancomycin dosing. Patient is also ordered cefepime.  ? ?Plan: ? ?Cefepime 2 g IV q8h ? ?Vancomycin 1 g IV q12h ?--Calculated AUC: 444, Cmin 12.1 ?--Daily Scr per protocol ?--Levels at steady state as clinically indicated ? ? ?Temp (24hrs), Avg:98 ?F (36.7 ?C), Min:98 ?F (36.7 ?C), Max:98 ?F (36.7 ?C) ? ?Recent Labs  ?Lab 01/20/22 ?0825  ?WBC 8.4  ?CREATININE 0.71  ?  ?Estimated Creatinine Clearance: 116.7 mL/min (by C-G formula based on SCr of 0.71 mg/dL).   ? ?Allergies  ?Allergen Reactions  ? Other Hives  ?  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 Dilaudid [Hydromorphone Hcl] Itching  ? ? ?Antimicrobials this admission: ?Vancomycin 4/23 >>  ?Cefepime 4/23 >>  ? ?Dose adjustments this admission: ?N/A ? ?Microbiology results: ?4/23 BCx: pending ?4/23 OR cultures: pending ? ?Thank you for allowing pharmacy to be a part of this patient?s care. ? ?Benita Gutter ?01/20/2022 2:25 PM ? ?

## 2022-01-20 NOTE — H&P (Signed)
? ?Referring Physician:  ?No referring provider defined for this encounter. ? ?Primary Physician:  ?Leonel Ramsay, MD ? ?Chief Complaint:  wound drainage ? ?History of Present Illness: ?01/20/2022 ?Eileen Chaney is a 46 y.o. female who presents with the chief complaint of wound drainage and feeling unwell.  She had a posterior cervical decompression on 01/07/22 and presents this morning with worsening wound drainage and smell from her posterior cervical wound.  She has generalized malaise and does not feel well. ? ?She was seen in clinic earlier this week and had a wound vac placed. The wound vac started having increased output yesterday and had a foul smell.  She called last night to report this to me and presented to the ER this morning. ? ? ? ?Review of Systems:  ?A 10 point review of systems is negative, except for the pertinent positives and negatives detailed in the HPI. ? ?Past Medical History: ?Past Medical History:  ?Diagnosis Date  ? Anxiety   ? Arthritis   ? Asthma   ? Cervical myelopathy with cervical radiculopathy (HCC)   ? Complication of anesthesia   ? DDD (degenerative disc disease), cervical   ? Depression   ? Diabetes mellitus without complication ([place])   ? type 2  ? Family history of adverse reaction to anesthesia   ? mother hard to wake up  ? Hyperlipidemia   ? Hypertension   ? Idiopathic urticaria   ? Pneumonia   ? PONV (postoperative nausea and vomiting)   ? ? ?Past Surgical History: ?Past Surgical History:  ?Procedure Laterality Date  ? ABDOMINAL HYSTERECTOMY  2018  ? uterine fibroids  ? ANKLE SURGERY Right 2022  ? ligament repair  ? ANTERIOR CERVICAL DECOMP/DISCECTOMY FUSION N/A 11/12/2021  ? Procedure: C3-5 ANTERIOR CERVICAL DISCECTOMY AND FUSION (GLOBUS HEDRON);  Surgeon: Meade Maw, MD;  Location: [facility] ORS;  Service: Neurosurgery;  Laterality: N/A;  ? CESAREAN SECTION    ? X3 2000, 2001, 2009  ? POSTERIOR CERVICAL LAMINECTOMY N/A 01/07/2022  ? Procedure: OPEN C5-7  POSTERIOR DECOMPRESSION;  Surgeon: Meade Maw, MD;  Location: [facility] ORS;  Service: Neurosurgery;  Laterality: N/A;  ? TONSILLECTOMY  2000  ? ? ?Allergies: ?Allergies as of 01/20/2022 - Review Complete 01/20/2022  ?Allergen Reaction Noted  ? Other Hives 07/03/2020  ? ? ?Medications: ? ?Current Facility-Administered Medications:  ?  0.9 %  sodium chloride infusion, , Intravenous, Continuous, Starleen Blue, Jennette Dubin, MD ?  HYDROmorphone (DILAUDID) injection 0.5 mg, 0.5 mg, Intravenous, Once, Starleen Blue, Jennette Dubin, MD ?  insulin aspart (novoLOG) injection 0-15 Units, 0-15 Units, Subcutaneous, TID WC, Meade Maw, MD ?  insulin aspart (novoLOG) injection 0-5 Units, 0-5 Units, Subcutaneous, QHS, Meade Maw, MD ?  sodium chloride 0.9 % bolus 1,000 mL, 1,000 mL, Intravenous, Once, Starleen Blue Jennette Dubin, MD ? ?Current Outpatient Medications:  ?  albuterol (VENTOLIN HFA) 108 (90 Base) MCG/ACT inhaler, 1-2 puffs every 4-6 hours as needed for cough or wheezing. Rinse mouth after use, Disp: 1 each, Rfl: 1 ?  atorvastatin (LIPITOR) 40 MG tablet, Take 1 tablet (40 mg total) by mouth daily. (Patient taking differently: Take 40 mg by mouth at bedtime.), Disp: 90 tablet, Rfl: 0 ?  Baclofen 5 MG TABS, Take 5 mg by mouth 3 (three) times daily as needed (spasm)., Disp: 90 tablet, Rfl: 0 ?  Blood Glucose Monitoring Suppl (CONTOUR NEXT MONITOR) w/Device KIT, See admin instructions., Disp: , Rfl:  ?  celecoxib (CELEBREX) 100 MG capsule, Take 100 mg by mouth  2 (two) times daily as needed for moderate pain., Disp: , Rfl:  ?  cetirizine (ZYRTEC) 10 MG tablet, Take 10 mg by mouth daily., Disp: , Rfl:  ?  COLOSTRUM PO, Take 4 capsules by mouth daily., Disp: , Rfl:  ?  CONTOUR NEXT TEST test strip, 1 each 3 (three) times daily., Disp: , Rfl:  ?  cyclobenzaprine (FLEXERIL) 10 MG tablet, Take 10 mg by mouth 3 (three) times daily as needed for muscle spasms., Disp: , Rfl:  ?  diclofenac Sodium (VOLTAREN) 1 % GEL, Apply 2 g topically  daily as needed (pain)., Disp: , Rfl:  ?  Dulaglutide (TRULICITY) 3 JA/2.5KN SOPN, Inject 3 mg into the skin every Saturday., Disp: , Rfl:  ?  EPINEPHrine 0.3 mg/0.3 mL IJ SOAJ injection, Inject 0.3 mg into the muscle as needed for anaphylaxis., Disp: , Rfl:  ?  lisinopril (ZESTRIL) 10 MG tablet, Take 1 tablet (10 mg total) by mouth daily., Disp: 90 tablet, Rfl: 0 ?  Multiple Vitamin (MULTI-VITAMIN) tablet, Take 1 tablet by mouth daily., Disp: , Rfl:  ?  NALTREXONE HCL PO, Take 4.5 mg by mouth daily., Disp: , Rfl:  ?  sertraline (ZOLOFT) 100 MG tablet, Take 2 tablets (200 mg total) by mouth daily. (Patient taking differently: Take 200 mg by mouth at bedtime.), Disp: 90 tablet, Rfl: 0 ?  spironolactone (ALDACTONE) 50 MG tablet, Take 50 mg by mouth daily., Disp: , Rfl:  ? ? ?Social History: ?Social History  ? ?Tobacco Use  ? Smoking status: Never  ? Smokeless tobacco: Never  ?Vaping Use  ? Vaping Use: Never used  ?Substance Use Topics  ? Alcohol use: Yes  ?  Alcohol/week: 0.0 - 1.0 standard drinks  ?  Comment: rarely  ? Drug use: Not Currently  ? ? ?Family Medical History: ?No family history on file. ? ?Physical Examination: ?Vitals:  ? 01/20/22 0825  ?BP: 125/79  ?Pulse: (!) 112  ?Resp: 19  ?Temp: 98 ?F (36.7 ?C)  ?SpO2: 95%  ? ?Heart sounds normal no MRG. Chest Clear to Auscultation Bilaterally. ? ? ?General: Patient is well developed, well nourished. She appears unwell. ? ?Psychiatric: Patient is non-anxious. ? ?Head:  Pupils equal, round, and reactive to light. ? ?ENT:  Oral mucosa appears well hydrated. ? ?Neck:   Supple.  ? ?Respiratory: Patient is breathing without any difficulty. ? ?Extremities: No edema. ? ?Vascular: Palpable pulses in dorsal pedal vessels. ? ?Skin:   On exposed skin, there are no abnormal skin lesions. ? ?NEUROLOGICAL:  ?General: In no acute distress.   ?Awake, alert, oriented to person, place, and time.  Pupils equal round and reactive to light.  Facial tone is symmetric.  Tongue protrusion  is midline.  ? ?Strength: ?Side Biceps Triceps Deltoid Interossei Grip Wrist Ext. Wrist Flex.  ?R $R'5 5 5 5 5 5 5  'fN$ ?L $'5 5 5 5 5 5 5  'X$ ? ?Side Iliopsoas Quads Hamstring PF DF EHL  ?R $R'5 5 5 5 5 5  'sV$ ?L $'5 5 5 5 5 5  'E$ ? ? ?Bilateral upper and lower extremity sensation is intact to light touch. ? ?Gait testing is deferred.   ? ?Imaging: ?MRI C spine pending ? ? ?Labs: ? ?  Latest Ref Rng & Units 01/20/2022  ?  8:25 AM 11/05/2021  ?  1:06 PM  ?CBC  ?WBC 4.0 - 10.5 K/uL 8.4   11.0    ?Hemoglobin 12.0 - 15.0 g/dL 11.8   13.1    ?  Hematocrit 36.0 - 46.0 % 37.8   41.9    ?Platelets 150 - 400 K/uL 283   308    ? ? ?ESR CRP pending ? ? ?Assessment and Plan: ?[name] is a pleasant 46 y.o. female with increased drainage from her posterior cervical wound.  I am concerned that this represents a wound infection.   ? ?We will obtain additional laboratory workup, and will obtain MRI C spine with and without contrast. ? ?I will admit her to the hospital for management of her condition.  Based on her history and output of foul smelling drainage from her wound, this likely represents a wound infection.  I have recommended irrigation and debridement of her wound. ? ?I discussed the planned procedure at length with the patient, including the risks, benefits, alternatives, and indications. The risks discussed include but are not limited to bleeding, infection, need for reoperation, spinal fluid leak, stroke, vision loss, anesthetic complication, coma, paralysis, and even death. I also described in detail that improvement was not guaranteed. ? ?The patient expressed understanding of these risks, and asked that we proceed with surgery. ? ?I will obtain intraoperative cultures and then consult infectious disease specialist after the washout. ? ?Eileen Chaney K. Izora Ribas MD, [place] ?Dept. of Neurosurgery ?  ? ?

## 2022-01-20 NOTE — ED Provider Notes (Addendum)
? ?[facility] ?Provider Note ? ? ? Event Date/Time  ? First MD Initiated Contact with Patient 01/20/22 [phone]   ?  (approximate) ? ? ?History  ? ?Post-op Problem ? ? ?HPI ? ?Eileen Chaney is a 46 y.o. female  with past medical history of diabetes, asthma, cervical myelopathy s/p recent C5-7 decompression on 4/10.  Patient seen on 4/19 as outpatient with neurosurgery at that time was reporting several days of brownish thin drainage from the incision and was found to have a small area of superficial dehiscence at the top of the posterior neck incision.  The wound was cleaned and an incisional wound VAC was placed.  There was minimal drainage in the canister until several days ago.  Patient also noticed an odor from her back.  Has not had fevers at home but over the last 2 to 3 days feels generally unwell describes flulike symptoms body aches.  Denies headache new numbness tingling weakness.  She is currently taking an antibiotic that she was prescribed. ?  ? ?Past Medical History:  ?Diagnosis Date  ? Anxiety   ? Arthritis   ? Asthma   ? Cervical myelopathy with cervical radiculopathy (HCC)   ? Complication of anesthesia   ? DDD (degenerative disc disease), cervical   ? Depression   ? Diabetes mellitus without complication ([place])   ? type 2  ? Family history of adverse reaction to anesthesia   ? mother hard to wake up  ? Hyperlipidemia   ? Hypertension   ? Idiopathic urticaria   ? Pneumonia   ? PONV (postoperative nausea and vomiting)   ? ? ?Patient Active Problem List  ? Diagnosis Date Noted  ? Gestational diabetes 06/02/2020  ? Arthritis 10/11/2019  ? DDD (degenerative disc disease), lumbar 10/11/2019  ? Hyperlipidemia 10/11/2019  ? Idiopathic urticaria 10/11/2019  ? Recurrent major depressive disorder, in partial remission ([place]) 10/11/2019  ? Spinal stenosis of lumbar region without neurogenic claudication 10/11/2019  ? Type 2 diabetes mellitus without complication, with long-term current  use of insulin ([place]) 10/11/2019  ? Elevated lactic acid level 09/29/2017  ? Leukocytosis 09/29/2017  ? Diabetes ([place]) 09/28/2017  ? Hypertension 09/28/2017  ? Mild asthma without complication 50/53/9767  ? Morbid obesity ([place]) 09/28/2017  ? Thalassemia 09/28/2017  ? GAD (generalized anxiety disorder) 07/25/2014  ? 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 ? ? ?Physical Exam  ?Triage Vital Signs: ?ED Triage Vitals  ?Enc Vitals Group  ?   BP 01/20/22 0825 125/79  ?   Pulse Rate 01/20/22 0825 (!) 112  ?   Resp 01/20/22 0825 19  ?   Temp 01/20/22 0825 98 ?F (36.7 ?C)  ?   Temp src --   ?   SpO2 01/20/22 0825 95 %  ?   Weight --   ?   Height --   ?   Head Circumference --   ?   Peak Flow --   ?   Pain Score 01/20/22 0823 7  ?   Pain Loc --   ?   Pain Edu? --   ?   Excl. in [place]? --   ? ? ?Most recent vital signs: ?Vitals:  ? 01/20/22 0825  ?BP: 125/79  ?Pulse: (!) 112  ?Resp: 19  ?Temp: 98 ?F (36.7 ?C)  ?SpO2: 95%  ? ? ? ?General: Awake, no distress.  ?CV:  Good peripheral perfusion.  ?Resp:  Normal effort.  ?Abd:  No distention.  ?Neuro:             Awake, Alert, Oriented x 3  ?Other:  Wound VAC in place over the posterior neck, there is brownish thick material in the canister  ? ? ?ED Results / Procedures / Treatments  ?Labs ?(all labs ordered are listed, but only abnormal results are displayed) ?Labs Reviewed  ?CBC - Abnormal; Notable for the following components:  ?    Result Value  ? Hemoglobin 11.8 (*)   ? MCH 25.5 (*)   ? All other components within normal limits  ?BASIC METABOLIC PANEL - Abnormal; Notable for the following components:  ? Sodium 134 (*)   ? Glucose, Bld 116 (*)   ? All other components within normal limits  ?CULTURE, BLOOD (ROUTINE X 2)  ?CULTURE, BLOOD (ROUTINE X 2)  ?C-REACTIVE PROTEIN  ?SEDIMENTATION RATE  ? ? ? ?EKG ? ? ? ? ?RADIOLOGY ? ? ? ?PROCEDURES: ? ?Critical Care performed: No ? ?Procedures ? ? ?MEDICATIONS ORDERED IN ED: ?Medications  ?HYDROmorphone  (DILAUDID) injection 0.5 mg (has no administration in time range)  ?sodium chloride 0.9 % bolus 1,000 mL (has no administration in time range)  ?0.9 %  sodium chloride infusion (has no administration in time range)  ? ? ? ?IMPRESSION / MDM / ASSESSMENT AND PLAN / ED COURSE  ?I reviewed the triage vital signs and the nursing notes. ?             ?               ? ?Differential diagnosis includes, but is not limited to, wound infection, seroma, abscess ? ?Patient is a 46 year old female with recent c5-C7 decompression surgery presents with feeling generally unwell and increased drainage from wound VAC which was placed 4 days ago in the office is for some wound dehiscence.  She is mildly tachycardic.  Patient overall appears well nontoxic.  Has not had fevers at home.  The wound VAC is in place I see some brownish material that looks hardened in the canister.  Patient's pain is also worsened over the last several days.  Patient's labs overall reassuring she has no leukocytosis. Dr. Cari Caraway is at bedside changing wound VAC and assessing the wound.  Disposition pending neurosurgery recommendation. ? ?Dr. Cari Caraway admitting the patient to his service.  We will send ESR CRP and blood cultures but defer antibiotics.  Also obtain MRI of C-spine with and without.  Will give fluids including a bolus and start infusion of NS and treat pain with Dilaudid. ? ? ?FINAL CLINICAL IMPRESSION(S) / ED DIAGNOSES  ? ?Final diagnoses:  ?Postoperative infection, unspecified type, initial encounter  ? ? ? ?Rx / DC Orders  ? ?ED Discharge Orders   ? ?      Ordered  ?  Incentive spirometry RT       ? 01/20/22 0932  ? ?  ?  ? ?  ? ? ? ?Note:  This document was prepared using Dragon voice recognition software and may include unintentional dictation errors. ?  ?Rada Hay, MD ?01/20/22 [phone] ? ?  ?Rada Hay, MD ?01/20/22 [phone] ? ?

## 2022-01-20 NOTE — ED Notes (Signed)
RN to bedside to answer call bell. Pt is itching all over. Pt just received 0.5 mg of dilaudid for pain. MD made aware. Pt has no SOB, no airway involvement and no rash or hives.  ?

## 2022-01-20 NOTE — Anesthesia Preprocedure Evaluation (Addendum)
Anesthesia Evaluation  ?Patient identified by MRN, date of birth, ID band ?Patient awake ? ? ? ?Reviewed: ?Allergy & Precautions, H&P , NPO status , Patient's Chart, lab work & pertinent test results, reviewed documented beta blocker date and time  ? ?History of Anesthesia Complications ?(+) PONV and history of anesthetic complications ? ?Airway ?Mallampati: III ? ?TM Distance: >3 FB ?Neck ROM: Limited ? ? ? Dental ? ?(+) Dental Advidsory Given, Caps ?  ?Pulmonary ?neg shortness of breath, asthma , neg sleep apnea, neg COPD, neg recent URI,  ?  ?Pulmonary exam normal ?breath sounds clear to auscultation ? ? ? ? ? ? Cardiovascular ?Exercise Tolerance: Good ?hypertension, Pt. on medications ?(-) angina(-) Past MI and (-) Cardiac Stents Normal cardiovascular exam(-) dysrhythmias (-) Valvular Problems/Murmurs ?Rhythm:regular Rate:Normal ? ? ?  ?Neuro/Psych ?PSYCHIATRIC DISORDERS Anxiety Depression  Neuromuscular disease   ? GI/Hepatic ?negative GI ROS, Neg liver ROS,   ?Endo/Other  ?diabetes, Well Controlled, Type 2, Oral Hypoglycemic Agents ? Renal/GU ?negative Renal ROS  ?negative genitourinary ?  ?Musculoskeletal ? ?(+) Arthritis ,  ? Abdominal ?(+) + obese,   ?Peds ? Hematology ?negative hematology ROS ?(+)   ?Anesthesia Other Findings ?Past Medical History: ?No date: Asthma ?No date: Complication of anesthesia ?No date: Diabetes mellitus without complication ([place]) ?No date: PONV (postoperative nausea and vomiting) ? ? Reproductive/Obstetrics ?negative OB ROS ? ?  ? ? ? ? ? ? ? ? ? ? ? ? ? ?  ?  ? ? ? ? ? ? ? ?Anesthesia Physical ? ?Anesthesia Plan ? ?ASA: 3 ? ?Anesthesia Plan: General  ? ?Post-op Pain Management: Ofirmev IV (intra-op)*, Dilaudid IV and Ketamine IV*  ? ?Induction: Intravenous ? ?PONV Risk Score and Plan: 4 or greater and Propofol infusion, Midazolam, Ondansetron and Dexamethasone ? ?Airway Management Planned: Oral ETT ? ?Additional Equipment: None ? ?Intra-op Plan:   ? ?Post-operative Plan: Extubation in OR ? ?Informed Consent: I have reviewed the patients History and Physical, chart, labs and discussed the procedure including the risks, benefits and alternatives for the proposed anesthesia with the patient or authorized representative who has indicated his/her understanding and acceptance.  ? ? ? ?Dental Advisory Given ? ?Plan Discussed with: Anesthesiologist, CRNA and Surgeon ? ?Anesthesia Plan Comments: (Discussed risks of anesthesia with patient, including PONV, sore throat, lip/dental/eye damage. Rare risks discussed as well, such as cardiorespiratory and neurological sequelae, and allergic reactions. Discussed the role of CRNA in patient's perioperative care. Patient understands. ?Previous ACDF record reviewed, noted to have required high amount of IV opioids in pacu. Will plan on ketamine intraop.)  ? ? ? ? ? ?Anesthesia Quick Evaluation ? ?

## 2022-01-20 NOTE — Interval H&P Note (Signed)
History and Physical Interval Note: ? ?01/20/2022 ?12:11 PM ? ?Eileen Chaney  has presented today for surgery, with the diagnosis of wound infection.  The various methods of treatment have been discussed with the patient and family. After consideration of risks, benefits and other options for treatment, the patient has consented to  Procedure(s): ?posterior wound debridment (N/A) as a surgical intervention.  The patient's history has been reviewed, patient examined, no change in status, stable for surgery.  I have reviewed the patient's chart and labs.  Questions were answered to the patient's satisfaction.   ? ? ?Eileen Chaney ? ? ?

## 2022-01-20 NOTE — ED Notes (Signed)
Report given to OR.

## 2022-01-20 NOTE — Transfer of Care (Addendum)
Immediate Anesthesia Transfer of Care Note ? ?Patient: Eileen Chaney ? ?Procedure(s) Performed: posterior wound debridment ? ?Patient Location: PACU ? ?Anesthesia Type:General ? ?Level of Consciousness: awake, alert  and oriented ? ?Airway & Oxygen Therapy: Patient Spontanous Breathing ? ?Post-op Assessment: Report given to RN and Post -op Vital signs reviewed and stable ? ?Post vital signs: Reviewed and stable ? ?Last Vitals:  ?Vitals Value Taken Time  ?BP 130/72 01/20/22 1423  ?Temp 36 ?C 01/20/22 1421  ?Pulse 100 01/20/22 1424  ?Resp 11 01/20/22 1424  ?SpO2 95 % 01/20/22 1424  ?Vitals shown include unvalidated device data. ? ?Last Pain:  ?Vitals:  ? 01/20/22 1421  ?PainSc: 8   ?   ? ?  ? ?Complications: No notable events documented. ?

## 2022-01-20 NOTE — Anesthesia Postprocedure Evaluation (Signed)
Anesthesia Post Note ? ?Patient: Eileen Chaney ? ?Procedure(s) Performed: posterior wound debridment ? ?Patient location during evaluation: PACU ?Anesthesia Type: General ?Level of consciousness: awake and alert ?Pain management: pain level controlled ?Vital Signs Assessment: post-procedure vital signs reviewed and stable ?Respiratory status: spontaneous breathing, nonlabored ventilation and respiratory function stable ?Cardiovascular status: blood pressure returned to baseline and stable ?Postop Assessment: no apparent nausea or vomiting ?Anesthetic complications: no ? ? ?No notable events documented. ? ? ?Last Vitals:  ?Vitals:  ? 01/20/22 1600 01/20/22 1620  ?BP: 108/79 129/72  ?Pulse: 87 85  ?Resp: 16 16  ?Temp:  36.4 ?C  ?SpO2: 99% 99%  ?  ?Last Pain:  ?Vitals:  ? 01/20/22 1620  ?TempSrc: Oral  ?PainSc:   ? ? ?  ?  ?  ?  ?  ?  ? ?[place] Ouch ? ? ? ? ?

## 2022-01-20 NOTE — Op Note (Signed)
Indications: Eileen Chaney is a 46 yo female who presented with malaise and wound drainage after a prior posterior cervical decompression.  Due to concern for wound infection, she was advised for surgical intervention. ? ?Findings: pus deep to the fascia ? ?Preoperative Diagnosis: postoperative wound infection ?Postoperative Diagnosis: same ? ? ?EBL: 10 ml ?IVF: see AR ml ?Drains: 1 placed ?Disposition: Extubated and Stable to PACU ?Complications: none ? ?No foley catheter was placed. ? ? ?Preoperative Note:  ? ?Risks of surgery discussed include: infection, bleeding, stroke, coma, death, paralysis, CSF leak, nerve/spinal cord injury, numbness, tingling, weakness, complex regional pain syndrome, recurrent stenosis and/or disc herniation, vascular injury, development of instability, neck/back pain, need for further surgery, persistent symptoms, development of deformity, and the risks of anesthesia. The patient understood these risks and agreed to proceed. ? ?Operative Note: ? ?1. Irrigation and Debridement of posterior cervical surgical wound ? ? ?The patient was brought to the Operating Room, intubated and turned into the prone position. All pressure points were checked and double checked. The prior incision was identified.  It was prepped and draped in standard fashion. ? ?Antibiotics were held and given after cultures were taken. Timeout was performed. ? ?The incision was opened sharply. The stitches were divided and removed.  Pus was identified coming through the fascia.  This was cultured, then antibiotics given. ? ?The incisions was fully opened, then irrigated with regular irrigation, bacitracin-containing irrigation, and regular irrigation again.  After this was done, excisional debridement of the deep tissue was performed until healthy appearing tissue was identified.  Vancomycin powder was placed in the deep space. ? ?A drain was placed subfascially.  ? ?After hemostasis, the wound was closed in layers with 0  and 2-0 vicryl. Staples were placed on the skin.  An incisional wound vac was placed.  ? ?The patient was then flipped supine and extubated with incident. All counts were correct times 2 at the end of the case. No immediate complications were noted. ? ?Meade Maw MD ? ? ?

## 2022-01-20 NOTE — Anesthesia Procedure Notes (Signed)
Procedure Name: Intubation ?Date/Time: 01/20/2022 1:20 PM ?Performed by: Chanetta Marshall, CRNA ?Pre-anesthesia Checklist: Patient identified, Emergency Drugs available, Suction available and Patient being monitored ?Patient Re-evaluated:Patient Re-evaluated prior to induction ?Oxygen Delivery Method: Circle system utilized ?Preoxygenation: Pre-oxygenation with 100% oxygen ?Induction Type: IV induction ?Ventilation: Mask ventilation without difficulty ?Laryngoscope Size: McGraph and 3 ?Grade View: Grade II ?Tube type: Oral ?Tube size: 7.0 mm ?Number of attempts: 2 ?Airway Equipment and Method: Stylet and Oral airway ?Placement Confirmation: ETT inserted through vocal cords under direct vision, positive ETCO2, breath sounds checked- equal and bilateral and CO2 detector ?Secured at: 22 cm ?Tube secured with: Tape ?Dental Injury: Teeth and Oropharynx as per pre-operative assessment  ? ? ? ? ?

## 2022-01-20 NOTE — ED Notes (Signed)
Written consent obtained.  ?

## 2022-01-20 NOTE — ED Triage Notes (Signed)
Pt comes with c/o post op problem. Pt states she had decompression surgery. Pt states it started to drain and she went back. Pt had wound vac placed and states it continues to drain. Pt was advised to come here to get checked out. Pt states she just doesn't feel well.  ? ?Pt states low grade fevers at home of 98. ?

## 2022-01-21 ENCOUNTER — Encounter: Payer: Self-pay | Admitting: Neurosurgery

## 2022-01-21 DIAGNOSIS — T8141XA Infection following a procedure, superficial incisional surgical site, initial encounter: Secondary | ICD-10-CM | POA: Diagnosis present

## 2022-01-21 DIAGNOSIS — Z79899 Other long term (current) drug therapy: Secondary | ICD-10-CM | POA: Diagnosis not present

## 2022-01-21 DIAGNOSIS — L298 Other pruritus: Secondary | ICD-10-CM | POA: Diagnosis not present

## 2022-01-21 DIAGNOSIS — Z885 Allergy status to narcotic agent status: Secondary | ICD-10-CM | POA: Diagnosis not present

## 2022-01-21 DIAGNOSIS — Z7984 Long term (current) use of oral hypoglycemic drugs: Secondary | ICD-10-CM | POA: Diagnosis not present

## 2022-01-21 DIAGNOSIS — F32A Depression, unspecified: Secondary | ICD-10-CM | POA: Diagnosis present

## 2022-01-21 DIAGNOSIS — T8149XA Infection following a procedure, other surgical site, initial encounter: Secondary | ICD-10-CM | POA: Diagnosis not present

## 2022-01-21 DIAGNOSIS — R5381 Other malaise: Secondary | ICD-10-CM | POA: Diagnosis present

## 2022-01-21 DIAGNOSIS — E785 Hyperlipidemia, unspecified: Secondary | ICD-10-CM | POA: Diagnosis present

## 2022-01-21 DIAGNOSIS — B964 Proteus (mirabilis) (morganii) as the cause of diseases classified elsewhere: Secondary | ICD-10-CM | POA: Diagnosis present

## 2022-01-21 DIAGNOSIS — J45909 Unspecified asthma, uncomplicated: Secondary | ICD-10-CM | POA: Diagnosis present

## 2022-01-21 DIAGNOSIS — E119 Type 2 diabetes mellitus without complications: Secondary | ICD-10-CM | POA: Diagnosis present

## 2022-01-21 DIAGNOSIS — Y838 Other surgical procedures as the cause of abnormal reaction of the patient, or of later complication, without mention of misadventure at the time of the procedure: Secondary | ICD-10-CM | POA: Diagnosis present

## 2022-01-21 DIAGNOSIS — M1990 Unspecified osteoarthritis, unspecified site: Secondary | ICD-10-CM | POA: Diagnosis present

## 2022-01-21 DIAGNOSIS — Z886 Allergy status to analgesic agent status: Secondary | ICD-10-CM | POA: Diagnosis not present

## 2022-01-21 DIAGNOSIS — M5030 Other cervical disc degeneration, unspecified cervical region: Secondary | ICD-10-CM | POA: Diagnosis present

## 2022-01-21 DIAGNOSIS — I10 Essential (primary) hypertension: Secondary | ICD-10-CM | POA: Diagnosis present

## 2022-01-21 DIAGNOSIS — F419 Anxiety disorder, unspecified: Secondary | ICD-10-CM | POA: Diagnosis present

## 2022-01-21 DIAGNOSIS — Z6839 Body mass index (BMI) 39.0-39.9, adult: Secondary | ICD-10-CM | POA: Diagnosis not present

## 2022-01-21 DIAGNOSIS — E669 Obesity, unspecified: Secondary | ICD-10-CM | POA: Diagnosis present

## 2022-01-21 LAB — GLUCOSE, CAPILLARY
Glucose-Capillary: 92 mg/dL (ref 70–99)
Glucose-Capillary: 150 mg/dL — ABNORMAL HIGH (ref 70–99)
Glucose-Capillary: 164 mg/dL — ABNORMAL HIGH (ref 70–99)
Glucose-Capillary: 152 mg/dL — ABNORMAL HIGH (ref 70–99)
Glucose-Capillary: 120 mg/dL — ABNORMAL HIGH (ref 70–99)

## 2022-01-21 LAB — HEMOGLOBIN A1C
Hgb A1c MFr Bld: 6.8 % — ABNORMAL HIGH (ref 4.8–5.6)
Mean Plasma Glucose: 148.46 mg/dL

## 2022-01-21 LAB — CREATININE, SERUM
Creatinine, Ser: 0.63 mg/dL (ref 0.44–1.00)
GFR, Estimated: >60 mL/min (ref >60)

## 2022-01-21 MED ORDER — DIPHENHYDRAMINE HCL 50 MG/ML IJ SOLN
25.0000 mg | Freq: Four times a day (QID) | INTRAMUSCULAR | Status: DC | PRN
  Administered 2022-01-22: 25 mg via INTRAVENOUS
  Filled 2022-01-21: qty 1

## 2022-01-21 NOTE — Progress Notes (Signed)
Met with the patient at the bedside ?She will need a RW and [employer] will deliver ?She is considering renting a hospital bed to put on the first floor in case she cant go up stairs, I explained for that reason Insurance will no cover, I provided her with [employer] phone number in case she wants to rent one ?She stated that her Outpatient PT will come to her home with Home health orders, I will provide the printed order once obtained ?She will not be able to drive ?Her parents are coming to stay with her for a while and help her ? ?

## 2022-01-21 NOTE — Evaluation (Signed)
Physical Therapy Evaluation ?Patient Details ?Name: Eileen Chaney ?MRN: [id number] ?DOB: [birth year] ?Today's Date: 01/21/2022 ? ?History of Present Illness ? Pt is a 46 y.o. female who presents with the chief complaint of wound drainage and feeling unwell.  She had a C3-5 ACDF 11/12/21 and a posterior cervical decompression on 01/07/22 and presented with worsening wound drainage and smell from her posterior cervical wound.  She has generalized malaise and does not feel well.  Pt diagnosed with postoperative wound infection and is s/p I&D.  PMH includes DDD, DM, and HTN. ?  ?Clinical Impression ? Pt was pleasant and motivated to participate during the session and put forth good effort throughout. Pt required extra time and effort with bed mobility tasks and transfers. Once in standing pt ambulated without an AD and with the IV pole and presented with min instability and drifting left/right as well as very slow, cautious cadence.  Pt then ambulated with a RW with cues for sequencing with improved confidence and decreased drifting but cadence remained slow and cautious.  Pt's SpO2 and HR were both WNL during the session on room air.  Pt will benefit from HHPT upon discharge to safely address deficits listed in patient problem list for decreased caregiver assistance and eventual return to PLOF. ? ?   ?   ? ?Recommendations for follow up therapy are one component of a multi-disciplinary discharge planning process, led by the attending physician.  Recommendations may be updated based on patient status, additional functional criteria and insurance authorization. ? ?Follow Up Recommendations Home health PT ? ?  ?Assistance Recommended at Discharge Frequent or constant Supervision/Assistance  ?Patient can return home with the following ? A little help with walking and/or transfers;A little help with bathing/dressing/bathroom;Assistance with cooking/housework;Assist for transportation;Help with stairs or ramp for  entrance ? ?  ?Equipment Recommendations Rolling walker (2 wheels)  ?Recommendations for Other Services ?    ?  ?Functional Status Assessment Patient has had a recent decline in their functional status and demonstrates the ability to make significant improvements in function in a reasonable and predictable amount of time.  ? ?  ?Precautions / Restrictions Precautions ?Precautions: Fall ?Restrictions ?Weight Bearing Restrictions: No ?Other Position/Activity Restrictions: No cervical brace needed  ? ?  ? ?Mobility ? Bed Mobility ?Overal bed mobility: Modified Independent ?  ?  ?  ?  ?  ?  ?General bed mobility comments: Min extra time and effort only ?  ? ?Transfers ?Overall transfer level: Needs assistance ?Equipment used: Rolling walker (2 wheels) ?Transfers: Sit to/from Stand ?Sit to Stand: Supervision ?  ?  ?  ?  ?  ?General transfer comment: Min extra effort ?  ? ?Ambulation/Gait ?Ambulation/Gait assistance: Min guard ?Gait Distance (Feet): 150 Feet ?Assistive device: IV Pole, Rolling walker (2 wheels), None ?Gait Pattern/deviations: Step-through pattern, Decreased step length - right, Decreased step length - left, Drifts right/left ?Gait velocity: decreased ?  ?  ?General Gait Details: Pt ambulated without an AD and with IV pole and with both presented with drifting and min instability; Pt utilized a RW with grossly improved stability ? ?Stairs ?  ?  ?  ?  ?  ? ?Wheelchair Mobility ?  ? ?Modified Rankin (Stroke Patients Only) ?  ? ?  ? ?Balance Overall balance assessment: Needs assistance ?  ?Sitting balance-Leahy Scale: Normal ?  ?  ?Standing balance support: No upper extremity supported, During functional activity ?Standing balance-Leahy Scale: Fair ?  ?  ?  ?  ?  ?  ?  ?  ?  ?  ?  ?  ?   ? ? ? ?  Pertinent Vitals/Pain Pain Assessment ?Pain Assessment: 0-10 ?Pain Score: 8  ?Pain Location: posterior neck ?Pain Descriptors / Indicators: Sore ?Pain Intervention(s): Repositioned, Premedicated before session,  Monitored during session  ? ? ?Home Living Family/patient expects to be discharged to:: Private residence ?Living Arrangements: Children ?Available Help at Discharge: Family;Available 24 hours/day ?Type of Home: House ?Home Access: Stairs to enter ?Entrance Stairs-Rails: Left ?Entrance Stairs-Number of Steps: 1+1 with L rail ?Alternate Level Stairs-Number of Steps: 20 ?Home Layout: Two level;Able to live on main level with bedroom/bathroom ?Home Equipment: Cane - single point;Crutches ?   ?  ?Prior Function Prior Level of Function : Independent/Modified Independent ?  ?  ?  ?  ?  ?  ?Mobility Comments: Ind amb community distances without an AD ?ADLs Comments: Ind with ADLs, family has been driving since recent surgery ?  ? ? ?Hand Dominance  ?   ? ?  ?Extremity/Trunk Assessment  ? Upper Extremity Assessment ?Upper Extremity Assessment: Overall WFL for tasks assessed ?  ? ?Lower Extremity Assessment ?Lower Extremity Assessment: Overall WFL for tasks assessed ?  ? ?   ?Communication  ? Communication: No difficulties  ?Cognition Arousal/Alertness: Awake/alert ?Behavior During Therapy: [facility] for tasks assessed/performed ?Overall Cognitive Status: Within Functional Limits for tasks assessed ?  ?  ?  ?  ?  ?  ?  ?  ?  ?  ?  ?  ?  ?  ?  ?  ?  ?  ?  ? ?  ?General Comments   ? ?  ?Exercises    ? ?Assessment/Plan  ?  ?PT Assessment Patient needs continued PT services  ?PT Problem List Decreased activity tolerance;Decreased balance;Decreased mobility;Decreased knowledge of use of DME;Pain ? ?   ?  ?PT Treatment Interventions DME instruction;Gait training;Stair training;Functional mobility training;Therapeutic activities;Therapeutic exercise;Balance training;Patient/family education   ? ?PT Goals (Current goals can be found in the Care Plan section)  ?Acute Rehab PT Goals ?Patient Stated Goal: To be more active ?PT Goal Formulation: With patient ?Time For Goal Achievement: 02/03/22 ?Potential to Achieve Goals: Good ? ?   ?Frequency 7X/week ?  ? ? ?Co-evaluation   ?  ?  ?  ?  ? ? ?  ?AM-PAC PT "6 Clicks" Mobility  ?Outcome Measure Help needed turning from your back to your side while in a flat bed without using bedrails?: A Little ?Help needed moving from lying on your back to sitting on the side of a flat bed without using bedrails?: A Little ?Help needed moving to and from a bed to a chair (including a wheelchair)?: A Little ?Help needed standing up from a chair using your arms (e.g., wheelchair or bedside chair)?: A Little ?Help needed to walk in hospital room?: A Little ?Help needed climbing 3-5 steps with a railing? : A Little ?6 Click Score: 18 ? ?  ?End of Session Equipment Utilized During Treatment: Gait belt ?Activity Tolerance: Patient tolerated treatment well ?Patient left: in chair;with call bell/phone within reach;with chair alarm set ?Nurse Communication: Mobility status ?PT Visit Diagnosis: Difficulty in walking, not elsewhere classified (R26.2);Unsteadiness on feet (R26.81);Pain ?Pain - part of body:  (posterior neck) ?  ? ?Time: 5397-6734 ?PT Time Calculation (min) (ACUTE ONLY): 39 min ? ? ?Charges:   PT Evaluation ?$PT Eval Moderate Complexity: 1 Mod ?PT Treatments ?$Gait Training: 8-22 mins ?  ?   ? ?D. Royetta Asal PT, DPT ?01/21/22, 1:43 PM ? ? ? ?

## 2022-01-21 NOTE — Plan of Care (Signed)
?  Problem: Clinical Measurements: Goal: Ability to maintain clinical measurements within normal limits will improve Outcome: Progressing Goal: Will remain free from infection Outcome: Progressing Goal: Diagnostic test results will improve Outcome: Progressing Goal: Respiratory complications will improve Outcome: Progressing Goal: Cardiovascular complication will be avoided Outcome: Progressing   Problem: Activity: Goal: Risk for activity intolerance will decrease Outcome: Progressing   Problem: Pain Managment: Goal: General experience of comfort will improve Outcome: Progressing   

## 2022-01-21 NOTE — Consult Note (Signed)
NAME: Eileen Chaney  ?DOB: 1976/06/24  ?MRN: [id number]  ?Date/Time: 01/21/2022 2:47 PM ? ?REQUESTING PROVIDER: Dr.[email or web address] ?Subjective:  ?REASON FOR CONSULT: surgical site infection ?? ?Eileen Chaney is a 46 y.o. female with a history of DM, HTN, HLD ?Underwent C3-C5 ACDF on 11/12/21, followed by Posterior decompression c5-c6, c6-c7on 01/07/22.She is admitted because of worsening discharge from the surgical site and underwent irrigation and debridement of I am asked ot see patient for antibiotic management ?PT says the first surgery healed well ?The 2nd surgery was on 4/10 and a few days later she went for a long walk and was sweating- When she came home her daughter noted purulent drainage ? ? ?She then had a follow up appt and was started don linezolid on 4/19. No culture sent- She also had a  wound vac - The wound vac had purulent drainage after 2 days and she was asked to come to the hospital for wash out ?Pt had already taken 4 days of lineolid when she went for wash out ?PT had T max 98.7 ( her temp is usually 97 she says) ?She has DM and it was well controlled before the infection ? ? ?Past Medical History:  ?Diagnosis Date  ? Anxiety   ? Arthritis   ? Asthma   ? Cervical myelopathy with cervical radiculopathy (HCC)   ? Complication of anesthesia   ? DDD (degenerative disc disease), cervical   ? Depression   ? Diabetes mellitus without complication ([place])   ? type 2  ? Family history of adverse reaction to anesthesia   ? mother hard to wake up  ? Hyperlipidemia   ? Hypertension   ? Idiopathic urticaria   ? Pneumonia   ? PONV (postoperative nausea and vomiting)   ?  ?Past Surgical History:  ?Procedure Laterality Date  ? ABDOMINAL HYSTERECTOMY  2018  ? uterine fibroids  ? ANKLE SURGERY Right 2022  ? ligament repair  ? ANTERIOR CERVICAL DECOMP/DISCECTOMY FUSION N/A 11/12/2021  ? Procedure: C3-5 ANTERIOR CERVICAL DISCECTOMY AND FUSION (GLOBUS HEDRON);  Surgeon: Meade Maw, MD;  Location: [facility]  ORS;  Service: Neurosurgery;  Laterality: N/A;  ? CERVICAL WOUND DEBRIDEMENT N/A 01/20/2022  ? Procedure: posterior wound debridment;  Surgeon: Meade Maw, MD;  Location: [facility] ORS;  Service: Neurosurgery;  Laterality: N/A;  ? CESAREAN SECTION    ? X3 2000, 2001, 2009  ? POSTERIOR CERVICAL LAMINECTOMY N/A 01/07/2022  ? Procedure: OPEN C5-7 POSTERIOR DECOMPRESSION;  Surgeon: Meade Maw, MD;  Location: [facility] ORS;  Service: Neurosurgery;  Laterality: N/A;  ? TONSILLECTOMY  2000  ?  ?Social History  ? ?Socioeconomic History  ? Marital status: Divorced  ?  Spouse name: Not on file  ? Number of children: 3  ? Years of education: Not on file  ? Highest education level: Not on file  ?Occupational History  ? Not on file  ?Tobacco Use  ? Smoking status: Never  ? Smokeless tobacco: Never  ?Vaping Use  ? Vaping Use: Never used  ?Substance and Sexual Activity  ? Alcohol use: Yes  ?  Alcohol/week: 0.0 - 1.0 standard drinks  ?  Comment: rarely  ? Drug use: Not Currently  ? Sexual activity: Yes  ?Other Topics Concern  ? Not on file  ?Social History Narrative  ? Lives with children  ? ?Social Determinants of Health  ? ?Financial Resource Strain: Not on file  ?Food Insecurity: Not on file  ?Transportation Needs: Not on file  ?Physical Activity: Not on  file  ?Stress: Not on file  ?Social Connections: Not on file  ?Intimate Partner Violence: Not on file  ?  ?No family history on file. ?Allergies  ?Allergen Reactions  ? Other Hives  ?  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 Dilaudid [Hydromorphone Hcl] Itching  ? ?I? ?Current Facility-Administered Medications  ?Medication Dose Route Frequency Provider Last Rate Last Admin  ? 0.9 %  sodium chloride infusion  250 mL Intravenous Continuous Meade Maw, MD      ? 0.9 %  sodium chloride infusion   Intravenous Continuous Meade Maw, MD      ? 0.9 % NaCl with KCl 20 mEq/ L  infusion   Intravenous Continuous Meade Maw, MD   Stopped at 01/21/22 0151  ?  albuterol (PROVENTIL) (2.5 MG/3ML) 0.083% nebulizer solution 2.5 mg  2.5 mg Nebulization Q4H PRN Meade Maw, MD      ? atorvastatin (LIPITOR) tablet 40 mg  40 mg Oral QHS Meade Maw, MD   40 mg at 01/20/22 2117  ? ceFEPIme (MAXIPIME) 2 g in sodium chloride 0.9 % 100 mL IVPB  2 g Intravenous Q8H Meade Maw, MD 200 mL/hr at 01/21/22 0542 2 g at 01/21/22 0542  ? celecoxib (CELEBREX) capsule 100 mg  100 mg Oral BID PRN Meade Maw, MD      ? diphenhydrAMINE (BENADRYL) injection 25 mg  25 mg Intravenous Q6H PRN Loleta Dicker, PA      ? HYDROmorphone (DILAUDID) injection 0.5 mg  0.5 mg Intravenous Q3H PRN Meade Maw, MD   0.5 mg at 01/21/22 1145  ? insulin aspart (novoLOG) injection 0-15 Units  0-15 Units Subcutaneous TID WC Meade Maw, MD   3 Units at 01/21/22 1235  ? insulin aspart (novoLOG) injection 0-5 Units  0-5 Units Subcutaneous QHS Meade Maw, MD   2 Units at 01/20/22 2211  ? ketorolac (TORADOL) 15 MG/ML injection 15 mg  15 mg Intravenous Q6H Meade Maw, MD   15 mg at 01/21/22 0850  ? lisinopril (ZESTRIL) tablet 10 mg  10 mg Oral Daily Meade Maw, MD      ? loratadine (CLARITIN) tablet 10 mg  10 mg Oral Daily Meade Maw, MD   10 mg at 01/21/22 0849  ? menthol-cetylpyridinium (CEPACOL) lozenge 3 mg  1 lozenge Oral PRN Meade Maw, MD      ? Or  ? phenol (CHLORASEPTIC) mouth spray 1 spray  1 spray Mouth/Throat PRN Meade Maw, MD      ? methocarbamol (ROBAXIN) tablet 750 mg  750 mg Oral TID Meade Maw, MD   750 mg at 01/21/22 0850  ? ondansetron (ZOFRAN) tablet 4 mg  4 mg Oral Q6H PRN Meade Maw, MD      ? Or  ? ondansetron [facility]) injection 4 mg  4 mg Intravenous Q6H PRN Meade Maw, MD      ? oxyCODONE (Oxy IR/ROXICODONE) immediate release tablet 5-10 mg  5-10 mg Oral Q3H PRN Meade Maw, MD   10 mg at 01/21/22 1028  ? sertraline (ZOLOFT) tablet 200 mg  200 mg Oral QHS Meade Maw, MD   200 mg at 01/20/22 2117  ? sodium chloride flush (NS) 0.9 % injection 3 mL  3 mL Intravenous Q12H Meade Maw, MD   3 mL at 01/21/22 4098  ? sodium chloride flush (NS) 0.9 % injection 3 mL  3 mL Intravenous PRN Meade Maw, MD      ? spironolactone (ALDACTONE) tablet 50 mg  50 mg Oral  Daily Meade Maw, MD   50 mg at 01/21/22 0849  ? vancomycin (VANCOCIN) IVPB 1000 mg/200 mL premix  1,000 mg Intravenous Q12H Meade Maw, MD 200 mL/hr at 01/21/22 0617 1,000 mg at 01/21/22 0617  ?  ? ?Abtx:  ?Anti-infectives (From admission, onward)  ? ? Start     Dose/Rate Route Frequency Ordered Stop  ? 01/20/22 2200  ceFEPIme (MAXIPIME) 2 g in sodium chloride 0.9 % 100 mL IVPB       ? 2 g ?200 mL/hr over 30 Minutes Intravenous Every 8 hours 01/20/22 1438    ? 01/20/22 1800  vancomycin (VANCOCIN) IVPB 1000 mg/200 mL premix       ? 1,000 mg ?200 mL/hr over 60 Minutes Intravenous Every 12 hours 01/20/22 1436    ? 01/20/22 1430  ceFEPIme (MAXIPIME) 1 g in sodium chloride 0.9 % 100 mL IVPB  Status:  Discontinued       ? 1 g ?200 mL/hr over 30 Minutes Intravenous Every 8 hours 01/20/22 1423 01/20/22 1437  ? 01/20/22 1430  ceFEPIme (MAXIPIME) 2 g in sodium chloride 0.9 % 100 mL IVPB       ? 2 g ?200 mL/hr over 30 Minutes Intravenous  Once 01/20/22 1426 01/20/22 1630  ? 01/20/22 1350  vancomycin (VANCOCIN) powder  Status:  Discontinued       ?   As needed 01/20/22 1351 01/20/22 1418  ? 01/20/22 1315  ceFEPIme (MAXIPIME) 1 g in sodium chloride 0.9 % 100 mL IVPB  Status:  Discontinued       ? 1 g ?200 mL/hr over 30 Minutes Intravenous  Once 01/20/22 1306 01/20/22 1426  ? 01/20/22 1306  vancomycin (VANCOCIN) 1-5 GM/200ML-% IVPB  Status:  Discontinued       ?Note to Pharmacy: Mirna Mires: cabinet override  ?    01/20/22 1306 01/20/22 1314  ? ?  ? ? ?REVIEW OF SYSTEMS:  ?Const: negative fever, negative chills, negative weight loss ?Eyes: negative diplopia or visual changes, negative eye  pain ?ENT: negative coryza, negative sore throat ?Resp: negative cough, hemoptysis, dyspnea ?Cards: negative for chest pain, palpitations, lower extremity edema ?GU: negative for frequency, dysuria and hematuria ?GI: [place]

## 2022-01-21 NOTE — Progress Notes (Signed)
? ?   Attending Progress Note ? ?History: Eileen Chaney is s/p posterior cervical wound washout ? ?POD1: complaints of neck pain overnight. Improved this morning ? ?Physical Exam: ?Vitals:  ? 01/20/22 2130 01/21/22 0539  ?BP: 125/72 111/65  ?Pulse: 99 82  ?Resp: 18 18  ?Temp: 97.9 ?F (36.6 ?C) 98.3 ?F (36.8 ?C)  ?SpO2: 100% 100%  ? ? ?Drowsy but arouses easily to voiceOx3 ?CNI ? ?Strength:5/5 throughout [name] ?Incision was wound vac in place  ?Drain output not recorded ? ?Data: ? ?Recent Labs  ?Lab 01/20/22 ?0825 01/21/22 ?4656  ?NA 134*  --   ?K 3.9  --   ?CL 103  --   ?CO2 24  --   ?BUN 8  --   ?CREATININE 0.71 0.63  ?GLUCOSE 116*  --   ?CALCIUM 9.0  --   ? ?No results for input(s): AST, ALT, ALKPHOS in the last 168 hours. ? ?Invalid input(s): TBILI  ? Recent Labs  ?Lab 01/20/22 ?0825  ?WBC 8.4  ?HGB 11.8*  ?HCT 37.8  ?PLT 283  ? ?Recent Labs  ?Lab 01/20/22 ?8127  ?INR 1.1  ?  ?   ? ? ?Other tests/results:  ?Cultures pending  ? ?Assessment/Plan: ? ?Eileen Chaney is a 46 y.o s/p posterior cervical wound washout on 01/20/22.  ? ?- mobilize ?- pain control ?- DVT prophylaxis ?- PTOT ?- Will continue drain and wound vac ?- ID consulted; appreciated recommendations ? ?Cooper Render PA-C ?Department of Neurosurgery ? ?  ?

## 2022-01-21 NOTE — Evaluation (Signed)
Occupational Therapy Evaluation ?Patient Details ?Name: Eileen Chaney ?MRN: [id number] ?DOB: 11-01-1975 ?Today's Date: 01/21/2022 ? ? ?History of Present Illness Pt is a 46 y.o. female who presents with the chief complaint of wound drainage and feeling unwell.  She had a C3-5 ACDF 11/12/21 and a posterior cervical decompression on 01/07/22 and presented with worsening wound drainage and smell from her posterior cervical wound.  She has generalized malaise and does not feel well.  Pt diagnosed with postoperative wound infection and is s/p I&D.  PMH includes DDD, DM, and HTN.  ? ?Clinical Impression ?  ?Eileen Chaney was seen for OT evaluation this date. Prior to hospital admission, pt was Independent for mobility and ADLs. Pt lives in 2 level home with family available 24/7. Pt presents to acute OT demonstrating impaired ADL performance and functional mobility 2/2 decreased activity tolerance and functional ROM/balance deficits. Upon arrival pt standing with RW and RN assisting to chair as pt accidentally removed IV during seated bathing. Pt reports dizziness, orthostatics checked with symptoms noted. SUPERVISION sit<>stand, noted reaching out for single UE support for side steps. MD in for assessment, pt left in chair. Pt would benefit from skilled OT to address noted impairments and functional limitations (see below for any additional details). Upon hospital discharge, anticipate no OT follow up. ? ?SITING: BP 123/74, MAP 89, HR 97 ?STANDING: BP 114/59, MAP 74, HR 108, + dizzy  ? ?Recommendations for follow up therapy are one component of a multi-disciplinary discharge planning process, led by the attending physician.  Recommendations may be updated based on patient status, additional functional criteria and insurance authorization.  ? ?Follow Up Recommendations ? No OT follow up  ?  ?Assistance Recommended at Discharge Set up Supervision/Assistance  ?Patient can return home with the following A little help with  walking and/or transfers;A little help with bathing/dressing/bathroom;Help with stairs or ramp for entrance ? ?  ?Functional Status Assessment ? Patient has had a recent decline in their functional status and demonstrates the ability to make significant improvements in function in a reasonable and predictable amount of time.  ?Equipment Recommendations ? BSC/3in1  ?  ?Recommendations for Other Services   ? ? ?  ?Precautions / Restrictions Precautions ?Precautions: Fall ?Restrictions ?Weight Bearing Restrictions: No ?Other Position/Activity Restrictions: No cervical brace needed  ? ?  ? ?Mobility Bed Mobility ?  ?  ?  ?  ?  ?  ?  ?General bed mobility comments: received and left in chair ?  ? ?Transfers ?Overall transfer level: Needs assistance ?Equipment used: None ?Transfers: Sit to/from Stand ?Sit to Stand: Supervision ?  ?  ?  ?  ?  ?  ?  ? ?  ?Balance Overall balance assessment: Needs assistance ?  ?  ?  ?  ?Standing balance support: No upper extremity supported, During functional activity ?Standing balance-Leahy Scale: Fair ?  ?  ?  ?  ?  ?  ?  ?  ?  ?  ?  ?  ?   ? ?ADL either performed or assessed with clinical judgement  ? ?ADL Overall ADL's : Needs assistance/impaired ?  ?  ?  ?  ?  ?  ?  ?  ?  ?  ?  ?  ?  ?  ?  ?  ?  ?  ?  ?General ADL Comments: SUPERVISION for funcitonal mobliity and bathing sit<>stand  ? ? ? ? ?Pertinent Vitals/Pain Pain Assessment ?Pain Assessment: 0-10 ?Pain Score: 7  ?Pain  Location: posterior neck ?Pain Descriptors / Indicators: Sore ?Pain Intervention(s): Limited activity within patient's tolerance, Repositioned  ? ? ? ?Hand Dominance   ?  ?Extremity/Trunk Assessment Upper Extremity Assessment ?Upper Extremity Assessment: Overall WFL for tasks assessed ?  ?Lower Extremity Assessment ?Lower Extremity Assessment: Overall WFL for tasks assessed ?  ?  ?  ?Communication Communication ?Communication: No difficulties ?  ?Cognition Arousal/Alertness: Awake/alert ?Behavior During Therapy: [facility]  for tasks assessed/performed ?Overall Cognitive Status: Within Functional Limits for tasks assessed ?  ?  ?  ?  ?  ?  ?  ?  ?  ?  ?  ?  ?  ?  ?  ?  ?  ?  ?  ?   ?   ?   ? ? ?Home Living Family/patient expects to be discharged to:: Private residence ?Living Arrangements: Children ?Available Help at Discharge: Family;Available 24 hours/day ?Type of Home: House ?Home Access: Stairs to enter ?Entrance Stairs-Number of Steps: 1+1 with L rail ?Entrance Stairs-Rails: Left ?Home Layout: Two level;Able to live on main level with bedroom/bathroom ?Alternate Level Stairs-Number of Steps: 20 ?Alternate Level Stairs-Rails: Can reach both;Right;Left (The last 5 steps have L rail only) ?  ?  ?  ?  ?  ?Home Equipment: Cane - single point;Crutches ?  ?  ?  ? ?  ?Prior Functioning/Environment Prior Level of Function : Independent/Modified Independent ?  ?  ?  ?  ?  ?  ?Mobility Comments: Ind amb community distances without an AD ?ADLs Comments: Ind with ADLs, family has been driving since recent surgery ?  ? ?  ?  ?OT Problem List: Decreased strength;Decreased range of motion;Decreased activity tolerance;Impaired balance (sitting and/or standing);Decreased safety awareness ?  ?   ?OT Treatment/Interventions: Self-care/ADL training;Therapeutic exercise;Energy conservation;DME and/or AE instruction;Therapeutic activities;Balance training;Patient/family education  ?  ?OT Goals(Current goals can be found in the care plan section) Acute Rehab OT Goals ?Patient Stated Goal: to go home ?OT Goal Formulation: With patient ?Time For Goal Achievement: 02/04/22 ?Potential to Achieve Goals: Good ?ADL Goals ?Pt Will Perform Grooming: Independently;standing ?Pt Will Perform Lower Body Dressing: sit to/from stand;with modified independence ?Pt Will Transfer to Toilet: with modified independence;ambulating;regular height toilet  ?OT Frequency: Min 2X/week ?  ? ?Co-evaluation   ?  ?  ?  ?  ? ?  ?AM-PAC OT "6 Clicks" Daily Activity     ?Outcome Measure  Help from another person eating meals?: None ?Help from another person taking care of personal grooming?: A Little ?Help from another person toileting, which includes using toliet, bedpan, or urinal?: None ?Help from another person bathing (including washing, rinsing, drying)?: A Little ?Help from another person to put on and taking off regular upper body clothing?: A Little ?Help from another person to put on and taking off regular lower body clothing?: A Little ?6 Click Score: 20 ?  ?End of Session Nurse Communication: Mobility status ? ?Activity Tolerance: Patient tolerated treatment well ?Patient left: in chair;with call bell/phone within reach ? ?OT Visit Diagnosis: Other abnormalities of gait and mobility (R26.89);Muscle weakness (generalized) (M62.81)  ?              ?Time: 6767-2094 ?OT Time Calculation (min): 24 min ?Charges:  OT General Charges ?$OT Visit: 1 Visit ?OT Evaluation ?$OT Eval Moderate Complexity: 1 Mod ? ?Dessie Coma, M.S. OTR/L  ?01/21/22, 3:54 PM  ?ascom [phone] ? ?

## 2022-01-21 NOTE — Progress Notes (Signed)
Patient with severe itching generalized to entire body. MD Cari Caraway made aware. Benedryl 25 mg given prn.  ?

## 2022-01-22 ENCOUNTER — Inpatient Hospital Stay: Payer: Self-pay

## 2022-01-22 DIAGNOSIS — T8149XA Infection following a procedure, other surgical site, initial encounter: Secondary | ICD-10-CM | POA: Diagnosis not present

## 2022-01-22 LAB — AEROBIC CULTURE W GRAM STAIN (SUPERFICIAL SPECIMEN): Gram Stain: NONE SEEN

## 2022-01-22 LAB — CREATININE, SERUM
Creatinine, Ser: 0.53 mg/dL (ref 0.44–1.00)
GFR, Estimated: >60 mL/min (ref >60)

## 2022-01-22 LAB — C-REACTIVE PROTEIN: CRP: 1.1 mg/dL — ABNORMAL HIGH (ref <1.0)

## 2022-01-22 LAB — GLUCOSE, CAPILLARY
Glucose-Capillary: 132 mg/dL — ABNORMAL HIGH (ref 70–99)
Glucose-Capillary: 128 mg/dL — ABNORMAL HIGH (ref 70–99)
Glucose-Capillary: 120 mg/dL — ABNORMAL HIGH (ref 70–99)
Glucose-Capillary: 116 mg/dL — ABNORMAL HIGH (ref 70–99)

## 2022-01-22 LAB — SEDIMENTATION RATE: Sed Rate: 63 mm/hr — ABNORMAL HIGH (ref 0–20)

## 2022-01-22 LAB — CK: Total CK: 56 U/L (ref 38–234)

## 2022-01-22 MED ORDER — HYDROXYZINE HCL 10 MG PO TABS
10.0000 mg | ORAL_TABLET | Freq: Three times a day (TID) | ORAL | Status: DC | PRN
  Administered 2022-01-22 – 2022-01-24 (×2): 10 mg via ORAL
  Filled 2022-01-22 (×4): qty 1

## 2022-01-22 MED ORDER — SODIUM CHLORIDE 0.9 % IV SOLN
6.0000 mg/kg | Freq: Every day | INTRAVENOUS | Status: DC
  Administered 2022-01-23: 500 mg via INTRAVENOUS
  Filled 2022-01-22: qty 10

## 2022-01-22 NOTE — Plan of Care (Signed)

## 2022-01-22 NOTE — Progress Notes (Signed)
Physical Therapy Treatment ?Patient Details ?Name: Eileen Chaney ?MRN: [id number] ?DOB: [birth year] ?Today's Date: 01/22/2022 ? ? ?History of Present Illness Pt is a 46 y.o. female who presents with the chief complaint of wound drainage and feeling unwell.  She had a C3-5 ACDF 11/12/21 and a posterior cervical decompression on 01/07/22 and presented with worsening wound drainage and smell from her posterior cervical wound.  She has generalized malaise and does not feel well.  Pt diagnosed with postoperative wound infection and is s/p I&D.  PMH includes DDD, DM, and HTN. ? ?  ?PT Comments  ? ? Pt was pleasant and motivated to participate during the session and put forth good effort throughout. Pt ambulated with improved stability and cadence with a RW this session although cadence slowed significantly during the end of the walk secondary to neck pain. Pt practiced ambulation without an AD but was unable to do so without occasionally reaching for the guard rail along the wall for support.  Pt will benefit from HHPT upon discharge to safely address deficits listed in patient problem list for decreased caregiver assistance and eventual return to PLOF. ?  ?   ?Recommendations for follow up therapy are one component of a multi-disciplinary discharge planning process, led by the attending physician.  Recommendations may be updated based on patient status, additional functional criteria and insurance authorization. ? ?Follow Up Recommendations ? Home health PT ?  ?  ?Assistance Recommended at Discharge Frequent or constant Supervision/Assistance  ?Patient can return home with the following A little help with walking and/or transfers;A little help with bathing/dressing/bathroom;Assistance with cooking/housework;Assist for transportation;Help with stairs or ramp for entrance ?  ?Equipment Recommendations ? Rolling walker (2 wheels)  ?  ?Recommendations for Other Services   ? ? ?  ?Precautions / Restrictions  Precautions ?Precautions: Fall ?Restrictions ?Weight Bearing Restrictions: No ?Other Position/Activity Restrictions: No cervical brace needed  ?  ? ?Mobility ? Bed Mobility ?Overal bed mobility: Modified Independent ?  ?  ?  ?  ?  ?  ?General bed mobility comments: Min extra time and effort only ?  ? ?Transfers ?Overall transfer level: Needs assistance ?Equipment used: Rolling walker (2 wheels) ?Transfers: Sit to/from Stand ?Sit to Stand: Supervision ?  ?  ?  ?  ?  ?General transfer comment: Min extra effort ?  ? ?Ambulation/Gait ?Ambulation/Gait assistance: Supervision ?Gait Distance (Feet): 200 Feet x 1, 60 Feet x 1 ?Assistive device: Rolling walker (2 wheels), None ?Gait Pattern/deviations: Step-through pattern, Decreased step length - right, Decreased step length - left ?Gait velocity: decreased ?  ?  ?General Gait Details: Amb 1 x 200 feet with a RW and 1 x 60 feet without an AD; very slow cadence but steady without LOB with the RW, min instability and need to reach for rail occasionally for support without an AD ? ? ?Stairs ?  ?  ?  ?  ?  ? ? ?Wheelchair Mobility ?  ? ?Modified Rankin (Stroke Patients Only) ?  ? ? ?  ?Balance Overall balance assessment: Needs assistance ?  ?Sitting balance-Leahy Scale: Normal ?  ?  ?Standing balance support: No upper extremity supported, During functional activity, Bilateral upper extremity supported ?Standing balance-Leahy Scale: Fair ?  ?  ?  ?  ?  ?  ?  ?  ?  ?  ?  ?  ?  ? ?  ?Cognition Arousal/Alertness: Awake/alert ?Behavior During Therapy: [facility] for tasks assessed/performed ?Overall Cognitive Status: Within Functional Limits for tasks assessed ?  ?  ?  ?  ?  ?  ?  ?  ?  ?  ?  ?  ?  ?  ?  ?  ?  ?  ?  ? ?  ?  Exercises   ? ?  ?General Comments   ?  ?  ? ?Pertinent Vitals/Pain Pain Assessment ?Pain Assessment: 0-10 ?Pain Score: 2  ?Pain Location: posterior neck ?Pain Descriptors / Indicators: Sore ?Pain Intervention(s): Premedicated before session, Monitored during session   ? ? ?Home Living   ?  ?  ?  ?  ?  ?  ?  ?  ?  ?   ?  ?Prior Function    ?  ?  ?   ? ?PT Goals (current goals can now be found in the care plan section) Progress towards PT goals: Progressing toward goals ? ?  ?Frequency ? ? ? 7X/week ? ? ? ?  ?PT Plan Current plan remains appropriate  ? ? ?Co-evaluation   ?  ?  ?  ?  ? ?  ?AM-PAC PT "6 Clicks" Mobility   ?Outcome Measure ? Help needed turning from your back to your side while in a flat bed without using bedrails?: A Little ?Help needed moving from lying on your back to sitting on the side of a flat bed without using bedrails?: A Little ?Help needed moving to and from a bed to a chair (including a wheelchair)?: A Little ?Help needed standing up from a chair using your arms (e.g., wheelchair or bedside chair)?: A Little ?Help needed to walk in hospital room?: A Little ?Help needed climbing 3-5 steps with a railing? : A Little ?6 Click Score: 18 ? ?  ?End of Session Equipment Utilized During Treatment: Gait belt ?Activity Tolerance: Patient tolerated treatment well ?Patient left: in chair;with call bell/phone within reach ?Nurse Communication: Mobility status ?PT Visit Diagnosis: Difficulty in walking, not elsewhere classified (R26.2);Unsteadiness on feet (R26.81);Pain ?  ? ? ?Time: 7517-0017 ?PT Time Calculation (min) (ACUTE ONLY): 28 min ? ?Charges:  $Gait Training: 23-37 mins          ?          ? ?D. Royetta Asal PT, DPT ?01/22/22, 1:15 PM ? ?

## 2022-01-22 NOTE — Progress Notes (Signed)
Spoke with Watt Climes, RN for patient. RN states patient has no discharge plans for today. Likely d/c 01/23/22.Currently has 1 PIV for meds/IVF. Will alert the provider PICC to be placed in the AM. ?

## 2022-01-22 NOTE — Progress Notes (Signed)
Dr. Izora Ribas made aware of PICC placement for tomorrow. ?

## 2022-01-22 NOTE — Progress Notes (Signed)
OT Cancellation Note ? ?Patient Details ?Name: Eileen Chaney ?MRN: [id number] ?DOB: 1976-09-11 ? ? ?Cancelled Treatment:    Reason Eval/Treat Not Completed: Other (comment); pt with 2 visitors present in room and pt in bathroom bathing herself, denying need for assistance with this ADL.  Will attempt later as time allows. ? ?Leta Speller, MS, OTR/L ? ?Darleene Cleaver ?01/22/2022, 4:35 PM ?

## 2022-01-22 NOTE — Progress Notes (Signed)
? ?  Date of Admission:  01/20/2022    ? ? ?ID: Eileen Chaney is a 46 y.o. female  ?Principal Problem: ?  Wound infection after surgery ? ? ? ?Subjective: ?Pt feeling okay ?C/l serous drainage in the drain ? ?Medications:  ? atorvastatin  40 mg Oral QHS  ? insulin aspart  0-15 Units Subcutaneous TID WC  ? insulin aspart  0-5 Units Subcutaneous QHS  ? ketorolac  15 mg Intravenous Q6H  ? lisinopril  10 mg Oral Daily  ? loratadine  10 mg Oral Daily  ? methocarbamol  750 mg Oral TID  ? sertraline  200 mg Oral QHS  ? sodium chloride flush  3 mL Intravenous Q12H  ? spironolactone  50 mg Oral Daily  ? ? ?Objective: ?Vital signs in last 24 hours: ?Temp:  [97.8 ?F (36.6 ?C)-98.1 ?F (36.7 ?C)] 98 ?F (36.7 ?C) (04/25 0746) ?Pulse Rate:  [81-88] 81 (04/25 0746) ?Resp:  [16-20] 16 (04/25 0746) ?BP: (101-132)/(53-75) 101/58 (04/25 0746) ?SpO2:  [98 %-100 %] 100 % (04/25 0746) ? ?PHYSICAL EXAM:  ?General: Alert, cooperative, no distress, appears stated age.  ?Head: Normocephalic, without obvious abnormality, atraumatic. ?Nape of neck=wound vac ?Eyes: Conjunctivae clear, anicteric sclerae. Pupils are equal ?ENT Nares normal. No drainage or sinus tenderness. ?Lungs: Clear to auscultation bilaterally. No Wheezing or Rhonchi. No rales. ?Heart: Regular rate and rhythm, no murmur, rub or gallop. ?Abdomen: Soft, non-tender,not distended.  ?Extremities: atraumatic, no cyanosis. No edema. No clubbing ?Skin: No rashes or lesions. Or bruising ?Lymph: Cervical, supraclavicular normal. ?Neurologic: Grossly non-focal ? ?Lab Results ?Recent Labs  ?  01/20/22 ?0825 01/21/22 ?3825 01/22/22 ?0919  ?WBC 8.4  --   --   ?HGB 11.8*  --   --   ?HCT 37.8  --   --   ?NA 134*  --   --   ?K 3.9  --   --   ?CL 103  --   --   ?CO2 24  --   --   ?BUN 8  --   --   ?CREATININE 0.71 0.63 0.53  ? ?Liver Panel ?No results for input(s): PROT, ALBUMIN, AST, ALT, ALKPHOS, BILITOT, BILIDIR, IBILI in the last 72 hours. ?Sedimentation Rate ?Recent Labs  ?   01/22/22 ?0220  ?ESRSEDRATE 63*  ? ?C-Reactive Protein ?Recent Labs  ?  01/20/22 ?0955 01/22/22 ?0220  ?CRP 4.9* 1.1*  ? ? ?Microbiology: ?Wc- morganella ? ? ? ?Assessment/Plan: ?ACDF C3-C5 on 11/09/2021.  Site healed very well ? ?Posterior cervical decompression C5-C7 on 01/07/2022.  Now has surgical site infection.  Status post I&D and washout ?Morganella in culture so far.  Because patient was on linezolid for 4 days before the I&D it is hard to say whether the culture truly is negative for Staph aureus.  It could be false negative culture because of linezolid which is a very potent anti-MRSA/strep/Enterococcus antibiotic.  So we will treat for both gram-positive organisms and Morganella.  So we will do daptomycin and stop vancomycin and continue with cefepime 2 g IV every 8 hours. ?Discussed with patient in detail also discussed with Dr. Izora Ribas. ? ?Diabetes mellitus on Trulicity ? ?Hypertension on spironolactone and lisinopril ? ?Hyperlipidemia on atorvastatin.  When she goes on daptomycin the atorvastatin dose will be reduced from 40 mg to 20 mg to prevent rhabdomyolysis ? ?Discussed the management with the care team. ?

## 2022-01-22 NOTE — Progress Notes (Signed)
? ?   Attending Progress Note ? ?History: Eileen Chaney is s/p posterior cervical wound washout ? ?POD2: Complaints of diffuse itching and neck pain ? ?POD1: complaints of neck pain overnight. Improved this morning ? ?Physical Exam: ?Vitals:  ? 01/22/22 0420 01/22/22 0746  ?BP: 132/75 (!) 101/58  ?Pulse: 85 81  ?Resp: 20 16  ?Temp: 98 ?F (36.7 ?C) 98 ?F (36.7 ?C)  ?SpO2: 98% 100%  ? ?A&Ox3 ?CNI ? ?Strength:5/5 throughout [place] ?Incision was wound vac in place  ?Drain output not recorded ? ?Data: ? ?Recent Labs  ?Lab 01/20/22 ?0825 01/21/22 ?0034  ?NA 134*  --   ?K 3.9  --   ?CL 103  --   ?CO2 24  --   ?BUN 8  --   ?CREATININE 0.71 0.63  ?GLUCOSE 116*  --   ?CALCIUM 9.0  --   ? ? ?No results for input(s): AST, ALT, ALKPHOS in the last 168 hours. ? ?Invalid input(s): TBILI  ? Recent Labs  ?Lab 01/20/22 ?0825  ?WBC 8.4  ?HGB 11.8*  ?HCT 37.8  ?PLT 283  ? ? ?Recent Labs  ?Lab 01/20/22 ?9179  ?INR 1.1  ? ?  ?   ? ? ?Other tests/results:  ?Cultures positive for Morgenella  ? ?Assessment/Plan: ? ?Eileen Chaney is a 46 y.o s/p posterior cervical wound washout on 01/20/22.  ? ?- mobilize ?- pain control ?- DVT prophylaxis ?- PTOT ?- Will continue drain and wound vac ?- Started Hydroxyzine for itching ?- ID consulted; appreciated recommendations ? ?Cooper Render PA-C ?Department of Neurosurgery ? ?  ?

## 2022-01-23 LAB — GLUCOSE, CAPILLARY
Glucose-Capillary: 92 mg/dL (ref 70–99)
Glucose-Capillary: 119 mg/dL — ABNORMAL HIGH (ref 70–99)
Glucose-Capillary: 111 mg/dL — ABNORMAL HIGH (ref 70–99)
Glucose-Capillary: 101 mg/dL — ABNORMAL HIGH (ref 70–99)

## 2022-01-23 MED ORDER — SODIUM CHLORIDE 0.9% FLUSH: 10.0000 mL | INTRAVENOUS | Status: DC | PRN

## 2022-01-23 MED ORDER — CHLORHEXIDINE GLUCONATE CLOTH 2 % EX PADS
6.0000 | MEDICATED_PAD | Freq: Every day | CUTANEOUS | Status: DC
  Administered 2022-01-23 – 2022-01-24 (×2): 6 via TOPICAL

## 2022-01-23 MED ORDER — SODIUM CHLORIDE 0.9% FLUSH
10.0000 mL | Freq: Two times a day (BID) | INTRAVENOUS | Status: DC
  Administered 2022-01-23: 10 mL
  Administered 2022-01-23: 20 mL
  Administered 2022-01-24: 10 mL

## 2022-01-23 NOTE — Progress Notes (Signed)
? ?   Attending Progress Note ? ?History: Eileen Chaney is s/p posterior cervical wound washout ? ?POD3: right sided neck pain.  ? ?POD2: Complaints of diffuse itching and neck pain ? ?POD1: complaints of neck pain overnight. Improved this morning ? ?Physical Exam: ?Vitals:  ? 01/23/22 0003 01/23/22 0417  ?BP: (!) 108/54 (!) 109/59  ?Pulse: 84 84  ?Resp: 20 20  ?Temp: 97.7 ?F (36.5 ?C) 98 ?F (36.7 ?C)  ?SpO2: 100% 98%  ? ?A&Ox3 ?CNI ? ?Strength:5/5 throughout Eileen Chaney ?Incision was wound vac in place  ?Drain output 0 overnight ? ?Data: ? ?Recent Labs  ?Lab 01/20/22 ?0825 01/21/22 ?9675 01/22/22 ?0919  ?NA 134*  --   --   ?K 3.9  --   --   ?CL 103  --   --   ?CO2 24  --   --   ?BUN 8  --   --   ?CREATININE 0.71   < > 0.53  ?GLUCOSE 116*  --   --   ?CALCIUM 9.0  --   --   ? < > = values in this interval not displayed.  ? ? ?No results for input(s): AST, ALT, ALKPHOS in the last 168 hours. ? ?Invalid input(s): TBILI  ? Recent Labs  ?Lab 01/20/22 ?0825  ?WBC 8.4  ?HGB 11.8*  ?HCT 37.8  ?PLT 283  ? ? ?Recent Labs  ?Lab 01/20/22 ?9163  ?INR 1.1  ? ?  ?   ? ? ?Other tests/results:  ?Cultures positive for Morgenella  ? ?Assessment/Plan: ? ?Eileen Chaney is a 46 y.o s/p posterior cervical wound washout on 01/20/22.  ? ?- mobilize ?- pain control ?- DVT prophylaxis ?- PTOT ?- removed wound vac and drain ?- Started Hydroxyzine for itching ?- ID consulted; dispo pending final recs. appreciated recommendations ? ?Cooper Render PA-C ?Department of Neurosurgery ? ?  ?

## 2022-01-23 NOTE — TOC Progression Note (Signed)
Transition of Care (TOC) - Progression Note  ? ? ?Patient Details  ?Name: Eileen Chaney ?MRN: [id number] ?Date of Birth: 1975/12/28 ? ?Transition of Care (TOC) CM/SW Contact  ?Conception Oms, RN ?Phone Number: ?01/23/2022, 10:51 AM ? ?Clinical Narrative:    ?Spoke with Pam with home Infusion, The plan is to have [facility] or [facility] to see the patient for the [facility] nursing to change PICC line dressing and draw Labs,  ? ?To get PICC placed today ? ? ?Expected Discharge Plan: [place] ?Barriers to Discharge: Continued Medical Work up ? ?Expected Discharge Plan and Services ?Expected Discharge Plan: [place] ?  ?Discharge Planning Services: CM Consult ?  ?  ?                ?DME Arranged: Walker rolling ?DME Agency: [employer] ?Date DME Agency Contacted: 01/21/22 ?Time DME Agency Contacted: [phone] ?Representative spoke with at DME Agency: Intake ?HH Arranged: NA ?  ?  ?  ?  ? ? ?Social Determinants of Health (SDOH) Interventions ?  ? ?Readmission Risk Interventions ?   ? View : No data to display.  ?  ?  ?  ? ? ?

## 2022-01-23 NOTE — Discharge Summary (Signed)
Physician Discharge Summary  ?Patient ID: ?Eileen Chaney ?MRN: [id number] ?DOB/AGE: 48/48/1977 48 y.o. ? ?Admit date: 01/20/2022 ?Discharge date: 01/24/2022 ? ?Admission Diagnoses: ?Obesity ?Surgical Site infection ? ?Discharge Diagnoses:  ?Principal Problem: ?  Wound infection after surgery ? ? ?Discharged Condition: good ? ?Hospital Course: [name] presented with wound drainage approximately 2 weeks after posterior cervical decompression.  She had signs and symptoms of wound infection, prompting recommendation of washout. ? ?After the wound was washed out, [name] improved clinically.  Infectious disease guided antibiotics.  ? ?Consults: ID ? ?Significant Diagnostic Studies: microbiology: wound culture: positive for Morganella ? ?Treatments: surgery: Wound washout ? ?Discharge Exam: ?Blood pressure 119/75, pulse 79, temperature 98.2 ?F (36.8 ?C), temperature source Oral, resp. rate 16, weight 116.6 kg, SpO2 100 %. ?General appearance: alert and cooperative ?CNI ?MAEW 5/5 ? ? ?Disposition:  ?Discharge disposition: 06-Home-Health Care Svc ? ? ? ? ? ? ?Discharge Instructions   ? ? [facility] pharmacist to adjust dose for Vancomycin, Aminoglycosides and other anti-infective therapies as requested by physician.   Complete by: As directed ?  ? [facility] to provide Cath Flo 2mg    Complete by: As directed ?  ? Administer for PICC line occlusion and as ordered by physician for other access device issues.  ? Anaphylaxis Kit: Provided to treat any anaphylactic reaction to the medication being provided to the patient if First Dose or when requested by physician   Complete by: As directed ?  ? Epinephrine 1mg /ml vial / amp: Administer 0.3mg  (0.34ml) subcutaneously once for moderate to severe anaphylaxis, nurse to call physician and pharmacy when reaction occurs and call 911 if needed for immediate care  ? Diphenhydramine 50mg /ml IV vial: Administer 25-50mg  IV/IM PRN for first dose reaction,  rash, itching, mild reaction, nurse to call physician and pharmacy when reaction occurs  ? Sodium Chloride 0.9% NS 568ml IV: Administer if needed for hypovolemic blood pressure drop or as ordered by physician after call to physician with anaphylactic reaction  ? Change dressing on IV access line weekly and PRN   Complete by: As directed ?  ? Flush IV access with Sodium Chloride 0.9% and Heparin 10 units/ml or 100 units/ml   Complete by: As directed ?  ? Home infusion instructions - [facility]   Complete by: As directed ?  ? Instructions: Flush IV access with Sodium Chloride 0.9% and Heparin 10units/ml or 100units/ml  ? Change dressing on IV access line: Weekly and PRN  ? Instructions Cath Flo 2mg : Administer for PICC Line occlusion and as ordered by physician for other access device  ? [facility] pharmacist to adjust dose for: Vancomycin, Aminoglycosides and other anti-infective therapies as requested by physician  ? Incentive spirometry RT   Complete by: As directed ?  ? Method of administration may be changed at the discretion of home infusion pharmacist based upon assessment of the patient and/or caregiver?s ability to self-administer the medication ordered   Complete by: As directed ?  ? ?  ? ?Allergies as of 01/24/2022   ? ?   Reactions  ? Acetaminophen Itching  ? Eyes throat swelling  ? Other Hives  ? 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 Dilaudid [hydromorphone Hcl] Itching  ? ?  ? ?  ?Medication List  ?  ? ?STOP taking these medications   ? ?Baclofen 5 MG Tabs ?  ?cyclobenzaprine 10 MG tablet ?Commonly known as: FLEXERIL ?  ?linezolid 600 MG tablet ?Commonly  known as: ZYVOX ?  ? ?  ? ?TAKE these medications   ? ?albuterol 108 (90 Base) MCG/ACT inhaler ?Commonly known as: VENTOLIN HFA ?1-2 puffs every 4-6 hours as needed for cough or wheezing. Rinse mouth after use ?  ?atorvastatin 40 MG tablet ?Commonly known as: LIPITOR ?Take 1 tablet (40 mg total) by mouth daily. ?What changed:  when to take this ?  ?ceFEPime  IVPB ?Commonly known as: MAXIPIME ?Inject 2 g into the vein every 8 (eight) hours. Indication: cervical vertebrae post-op site infection ?First Dose: Yes ?Last Day of Therapy:  03/02/2022 ?Labs - Once weekly:  CBC/D, CMP, ESR and CPK ?Please pull PIC at completion of IV antibiotics ?Fax weekly labs to [phone] ?Method of administration: IV Push ?Method of administration may be changed at the discretion of home infusion pharmacist based upon assessment of the patient and/or caregiver's ability to self-administer the medication ordered. ?  ?celecoxib 100 MG capsule ?Commonly known as: CELEBREX ?Take 100 mg by mouth 2 (two) times daily as needed for moderate pain. ?  ?cetirizine 10 MG tablet ?Commonly known as: ZYRTEC ?Take 10 mg by mouth daily. ?  ?daptomycin  IVPB ?Commonly known as: CUBICIN ?Inject 500 mg into the vein daily. Indication: cervical vertebrae post-op site infection ?First Dose: Yes ?Last Day of Therapy:  03/02/2022 ?Labs - Once weekly:  CBC/D, CMP, ESR and CPK ?Please pull PIC at completion of IV antibiotics ?Fax weekly labs to [phone] ?Method of administration: IV Push ?Method of administration may be changed at the discretion of home infusion pharmacist based upon assessment of the patient and/or caregiver's ability to self-administer the medication ordered. ?  ?diazepam 5 MG tablet ?Commonly known as: VALIUM ?Take 1 tablet (5 mg total) by mouth every 8 (eight) hours as needed for up to 7 days for muscle spasms (if not controlled by methocarbamol). ?What changed:  ?how much to take ?when to take this ?reasons to take this ?  ?diclofenac Sodium 1 % Gel ?Commonly known as: VOLTAREN ?Apply 2 g topically daily as needed (pain). ?  ?EPINEPHrine 0.3 mg/0.3 mL Soaj injection ?Commonly known as: EPI-PEN ?Inject 0.3 mg into the muscle as needed for anaphylaxis. ?  ?lisinopril 10 MG tablet ?Commonly known as: ZESTRIL ?Take 1 tablet (10 mg total) by mouth daily. ?   ?Methocarbamol 1000 MG Tabs ?Take 1,000 mg by mouth 3 (three) times daily. ?  ?Multi-Vitamin tablet ?Take 1 tablet by mouth daily. ?  ?NALTREXONE HCL PO ?Take 4.5 mg by mouth daily. ?  ?oxyCODONE 5 MG immediate release tablet ?Commonly known as: Oxy IR/ROXICODONE ?Take 1-2 tablets (5-10 mg total) by mouth every 3 (three) hours as needed for up to 7 days for moderate pain or severe pain (5 mg for moderate pain, 10 mg for severe pain). ?  ?sertraline 100 MG tablet ?Commonly known as: ZOLOFT ?Take 2 tablets (200 mg total) by mouth daily. ?What changed: when to take this ?  ?spironolactone 50 MG tablet ?Commonly known as: ALDACTONE ?Take 50 mg by mouth daily. ?  ?Trulicity 3 FV/4.9SW Sopn ?Generic drug: Dulaglutide ?Inject 3 mg into the skin every Saturday. ?  ? ?  ? ?  ?  ? ? ?  ?Durable Medical Equipment  ?(From admission, onward)  ?  ? ? ?  ? ?  Start     Ordered  ? 01/21/22 1322  For home use only DME Walker rolling  Once       ?Question Answer Comment  ?  Walker: With 5 Inch Wheels   ?Patient needs a walker to treat with the following condition Difficulty walking   ?  ? 01/21/22 1322  ? 01/21/22 1113  For home use only DME Walker rolling  Once       ?Question Answer Comment  ?Walker: With 5 Inch Wheels   ?Patient needs a walker to treat with the following condition Difficulty walking   ?  ? 01/21/22 1112  ? ?  ?  ? ?  ? ? ?  ?Discharge Care Instructions  ?(From admission, onward)  ?  ? ? ?  ? ?  Start     Ordered  ? 01/24/22 0000  Change dressing on IV access line weekly and PRN  (Home infusion instructions - [facility] )       ? 01/24/22 0800  ? ?  ?  ? ?  ? ? Follow-up Information   ? ? Loleta Dicker, PA Follow up in 2 week(s).   ?Why: already scheduled ?Contact information: ?[place] [address] ?[phone] ? ? ?  ?  ? ?  ?  ? ?  ? ? ?Signed: ?Aloysious Vangieson ?01/24/2022, 8:08 AM ? ? ?

## 2022-01-23 NOTE — Treatment Plan (Signed)
Diagnosis: ?Cervical vertebrae post OP infection ?Baseline Creatinine <1 ? ?Culture Result: Morganella ? ?Allergies  ?Allergen Reactions  ? Acetaminophen Itching  ?  Eyes throat swelling  ? Other Hives  ?  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 Dilaudid [Hydromorphone Hcl] Itching  ? ? ?OPAT Orders ?Discharge antibiotics: ? ?Cefepime 2 grams IV every 8 hours  ? ?Daptomycin $RemoveBefor'500mg'pPgnSKldoOyi$  IV every 24 hours  ? ?Duration: ?6 weeks ?End Date: ?03/02/22 ? ?[facility] Care Per Protocol: ? ?Labs weekly while on IV antibiotics: ?_X_ CBC with differential ? ?_X_ CMP ?_ ?_X_ ESR ?_X_ CK ? ?_X_ Please pull PIC at completion of IV antibiotics ? ? ?Fax weekly labs to [phone] ? ?Clinic Follow Up Appt: 02/12/22 at 10.30am ? ? ?Call [phone] with any questions or critical values ? ?  ?

## 2022-01-23 NOTE — Progress Notes (Signed)
PHARMACY CONSULT NOTE FOR: ? ?OUTPATIENT  PARENTERAL ANTIBIOTIC THERAPY (OPAT) ? ?Indication: cervical vertebrae post-op site infection ?Regimen:  ?Daptomycin '500mg'$  IV q24h ?Cefepime 2gm IV q8h ?End date: 03/02/2022 ? ?IV antibiotic discharge orders are pended. ?To discharging provider:  please sign these orders via discharge navigator,  ?Select New Orders & click on the button choice - Manage This Unsigned Work.  ?  ? ?Thank you for allowing pharmacy to be a part of this patient's care. ? ?Doreene Eland, PharmD, BCPS, BCIDP ?Work Cell: [phone] ?01/23/2022 11:39 AM ? ? ?

## 2022-01-23 NOTE — Progress Notes (Signed)
Physical Therapy Treatment ?Patient Details ?Name: Eileen Chaney ?MRN: [id number] ?DOB: 1975-10-18 ?Today's Date: 01/23/2022 ? ? ?History of Present Illness Pt is a 46 y.o. female who presents with the chief complaint of wound drainage and feeling unwell.  She had a C3-5 ACDF 11/12/21 and a posterior cervical decompression on 01/07/22 and presented with worsening wound drainage and smell from her posterior cervical wound.  She has generalized malaise and does not feel well.  Pt diagnosed with postoperative wound infection and is s/p I&D.  PMH includes DDD, DM, and HTN. ? ?  ?PT Comments  ? ? Pt was pleasant and motivated to participate during the session and put forth good effort throughout. Pt's quality of gait continued to grossly improve with increased cadence and B step length with the RW.  Pt remained minimally unsteady during amb without an AD needing to occasionally reach out for support but again grossly improved from prior session. Pt demonstrated very good eccentric and concentric control and stability during stair training. Pt reported no adverse symptoms during the session other than right-sided neck pain. Pt will benefit from HHPT upon discharge to safely address deficits listed in patient problem list for decreased caregiver assistance and eventual return to PLOF. ? ?   ?Recommendations for follow up therapy are one component of a multi-disciplinary discharge planning process, led by the attending physician.  Recommendations may be updated based on patient status, additional functional criteria and insurance authorization. ? ?Follow Up Recommendations ? Home health PT ?  ?  ?Assistance Recommended at Discharge Frequent or constant Supervision/Assistance  ?Patient can return home with the following A little help with walking and/or transfers;A little help with bathing/dressing/bathroom;Assistance with cooking/housework;Assist for transportation;Help with stairs or ramp for entrance ?  ?Equipment  Recommendations ? Rolling walker (2 wheels)  ?  ?Recommendations for Other Services   ? ? ?  ?Precautions / Restrictions Precautions ?Precautions: Fall ?Restrictions ?Weight Bearing Restrictions: No ?Other Position/Activity Restrictions: No cervical brace needed  ?  ? ?Mobility ? Bed Mobility ?Overal bed mobility: Modified Independent ?  ?  ?  ?  ?  ?  ?General bed mobility comments: Min extra time and effort only ?  ? ?Transfers ?Overall transfer level: Needs assistance ?Equipment used: Rolling walker (2 wheels) ?Transfers: Sit to/from Stand ?Sit to Stand: Supervision ?  ?  ?  ?  ?  ?General transfer comment: Min extra time but good control and stability ?  ? ?Ambulation/Gait ?Ambulation/Gait assistance: Supervision ?Gait Distance (Feet): 150 Feet ?Assistive device: Rolling walker (2 wheels), None ?Gait Pattern/deviations: Step-through pattern, Decreased step length - right, Decreased step length - left ?Gait velocity: decreased ?  ?  ?General Gait Details: Amb 150 feet x 1 with a RW and x 1  without an AD; very slow cadence but steady without LOB with the RW, min instability and need to reach for rail occasionally for support without an AD ? ? ?Stairs ?Stairs: Yes ?Stairs assistance: Min guard ?Stair Management: One rail Left ?Number of Stairs: 2 ?General stair comments: Pt able to ascend/descend 2 steps x 2 with L rail with good eccentric and concentric control and stability ? ? ?Wheelchair Mobility ?  ? ?Modified Rankin (Stroke Patients Only) ?  ? ? ?  ?Balance Overall balance assessment: Needs assistance ?  ?Sitting balance-Leahy Scale: Normal ?  ?  ?Standing balance support: No upper extremity supported, During functional activity, Bilateral upper extremity supported ?Standing balance-Leahy Scale: Fair ?  ?  ?  ?  ?  ?  ?  ?  ?  ?  ?  ?  ?  ? ?  ?  Cognition Arousal/Alertness: Awake/alert ?Behavior During Therapy: [facility] for tasks assessed/performed ?Overall Cognitive Status: Within Functional Limits for tasks  assessed ?  ?  ?  ?  ?  ?  ?  ?  ?  ?  ?  ?  ?  ?  ?  ?  ?  ?  ?  ? ?  ?Exercises   ? ?  ?General Comments   ?  ?  ? ?Pertinent Vitals/Pain Pain Assessment ?Pain Assessment: 0-10 ?Pain Score: 6  ?Pain Location: right posterior neck ?Pain Descriptors / Indicators: Sore ?Pain Intervention(s): Premedicated before session, Monitored during session  ? ? ?Home Living   ?  ?  ?  ?  ?  ?  ?  ?  ?  ?   ?  ?Prior Function    ?  ?  ?   ? ?PT Goals (current goals can now be found in the care plan section) Progress towards PT goals: Progressing toward goals ? ?  ?Frequency ? ? ? 7X/week ? ? ? ?  ?PT Plan Current plan remains appropriate  ? ? ?Co-evaluation   ?  ?  ?  ?  ? ?  ?AM-PAC PT "6 Clicks" Mobility   ?Outcome Measure ? Help needed turning from your back to your side while in a flat bed without using bedrails?: A Little ?Help needed moving from lying on your back to sitting on the side of a flat bed without using bedrails?: A Little ?Help needed moving to and from a bed to a chair (including a wheelchair)?: A Little ?Help needed standing up from a chair using your arms (e.g., wheelchair or bedside chair)?: A Little ?Help needed to walk in hospital room?: A Little ?Help needed climbing 3-5 steps with a railing? : A Little ?6 Click Score: 18 ? ?  ?End of Session Equipment Utilized During Treatment: Gait belt ?Activity Tolerance: Patient tolerated treatment well ?Patient left: in chair;with call bell/phone within reach;with family/visitor present ?Nurse Communication: Mobility status ?PT Visit Diagnosis: Difficulty in walking, not elsewhere classified (R26.2);Unsteadiness on feet (R26.81);Pain ?Pain - part of body:  (neck) ?  ? ? ?Time: 7353-2992 ?PT Time Calculation (min) (ACUTE ONLY): 29 min ? ?Charges:  $Gait Training: 23-37 mins          ?          ?D. Royetta Asal PT, DPT ?01/23/22, 2:35 PM ? ? ? ?

## 2022-01-23 NOTE — Progress Notes (Signed)
Peripherally Inserted Central Catheter Placement ? ?The IV Nurse has discussed with the patient and/or persons authorized to consent for the patient, the purpose of this procedure and the potential benefits and risks involved with this procedure.  The benefits include less needle sticks, lab draws from the catheter, and the patient may be discharged home with the catheter. Risks include, but not limited to, infection, bleeding, blood clot (thrombus formation), and puncture of an artery; nerve damage and irregular heartbeat and possibility to perform a PICC exchange if needed/ordered by physician.  Alternatives to this procedure were also discussed.  Bard Power PICC patient education guide, fact sheet on infection prevention and patient information card has been provided to patient /or left at bedside.   ? ?PICC Placement Documentation  ?PICC Single Lumen 01/23/22 Right Brachial 39 cm 1 cm (Active)  ?Indication for Insertion or Continuance of Line Home intravenous therapies (PICC only) 01/23/22 1200  ?Exposed Catheter (cm) 0 cm 01/23/22 1200  ?Site Assessment Clean, Dry, Intact 01/23/22 1200  ?Line Status Flushed;Blood return noted;Saline locked 01/23/22 1200  ?Dressing Type Transparent 01/23/22 1200  ?Dressing Status Antimicrobial disc in place 01/23/22 1200  ?Dressing Change Due 01/24/22 01/23/22 1200  ? ? ? ? ? ?Eileen Chaney ?01/23/2022, 12:03 PM ? ?

## 2022-01-24 LAB — GLUCOSE, CAPILLARY: Glucose-Capillary: 103 mg/dL — ABNORMAL HIGH (ref 70–99)

## 2022-01-24 MED ORDER — CEFEPIME IV (FOR PTA / DISCHARGE USE ONLY): 2.0000 g | Freq: Three times a day (TID) | INTRAVENOUS | 0 refills | Status: AC

## 2022-01-24 MED ORDER — OXYCODONE HCL 5 MG PO TABS: 5.0000 mg | ORAL_TABLET | ORAL | 0 refills | Status: AC | PRN

## 2022-01-24 MED ORDER — METHOCARBAMOL 500 MG PO TABS
1000.0000 mg | ORAL_TABLET | Freq: Three times a day (TID) | ORAL | Status: DC
Start: 2022-01-24 — End: 2022-01-24
  Administered 2022-01-24: 1000 mg via ORAL
  Filled 2022-01-24: qty 2

## 2022-01-24 MED ORDER — METHOCARBAMOL 1000 MG PO TABS: 1000.0000 mg | ORAL_TABLET | Freq: Three times a day (TID) | ORAL | 0 refills | Status: DC

## 2022-01-24 MED ORDER — DIAZEPAM 5 MG PO TABS
5.0000 mg | ORAL_TABLET | Freq: Three times a day (TID) | ORAL | 0 refills | Status: AC | PRN
Start: 2022-01-24 — End: 2022-01-31

## 2022-01-24 MED ORDER — DIAZEPAM 5 MG PO TABS: 5.0000 mg | ORAL_TABLET | Freq: Three times a day (TID) | ORAL | Status: DC | PRN

## 2022-01-24 MED ORDER — DAPTOMYCIN IV (FOR PTA / DISCHARGE USE ONLY): 500.0000 mg | INTRAVENOUS | 0 refills | Status: AC

## 2022-01-24 NOTE — Progress Notes (Signed)
Physical Therapy Treatment ?Patient Details ?Name: Eileen Chaney ?MRN: [id number] ?DOB: 08/24/1976 ?Today's Date: 01/24/2022 ? ? ?History of Present Illness Pt is a 46 y.o. female who presents with the chief complaint of wound drainage and feeling unwell.  She had a C3-5 ACDF 11/12/21 and a posterior cervical decompression on 01/07/22 and presented with worsening wound drainage and smell from her posterior cervical wound.  She has generalized malaise and does not feel well.  Pt diagnosed with postoperative wound infection and is s/p I&D.  PMH includes DDD, DM, and HTN. ? ?  ?PT Comments  ? ? OOB and completes lap with ease.  Walking in room with no assist.  No further questions or concerns.  ?Recommendations for follow up therapy are one component of a multi-disciplinary discharge planning process, led by the attending physician.  Recommendations may be updated based on patient status, additional functional criteria and insurance authorization. ? ?Follow Up Recommendations ? Outpatient PT ?  ?  ?Assistance Recommended at Discharge Frequent or constant Supervision/Assistance  ?Patient can return home with the following Assistance with cooking/housework;Assist for transportation;Help with stairs or ramp for entrance ?  ?Equipment Recommendations ? Rolling walker (2 wheels)  ?  ?Recommendations for Other Services   ? ? ?  ?Precautions / Restrictions Precautions ?Precautions: Fall ?Restrictions ?Weight Bearing Restrictions: No ?Other Position/Activity Restrictions: No cervical brace needed  ?  ? ?Mobility ? Bed Mobility ?Overal bed mobility: Modified Independent ?  ?  ?  ?  ?  ?  ?  ?  ? ?Transfers ?Overall transfer level: Modified independent ?  ?  ?  ?  ?  ?  ?  ?  ?  ?  ? ?Ambulation/Gait ?Ambulation/Gait assistance: Modified independent (Device/Increase time) ?Gait Distance (Feet): 160 Feet ?Assistive device: None ?Gait Pattern/deviations: Step-through pattern, Decreased step length - right, Decreased step length  - left ?Gait velocity: decreased ?  ?  ?General Gait Details: feels good without AD inside but wishes to keep RW delivered for outside and longer walks ? ? ?Stairs ?  ?  ?  ?  ?  ? ? ?Wheelchair Mobility ?  ? ?Modified Rankin (Stroke Patients Only) ?  ? ? ?  ?Balance Overall balance assessment: Needs assistance ?  ?Sitting balance-Leahy Scale: Normal ?  ?  ?Standing balance support: No upper extremity supported, During functional activity, Bilateral upper extremity supported ?Standing balance-Leahy Scale: Fair ?  ?  ?  ?  ?  ?  ?  ?  ?  ?  ?  ?  ?  ? ?  ?Cognition Arousal/Alertness: Awake/alert ?Behavior During Therapy: [facility] for tasks assessed/performed ?Overall Cognitive Status: Within Functional Limits for tasks assessed ?  ?  ?  ?  ?  ?  ?  ?  ?  ?  ?  ?  ?  ?  ?  ?  ?  ?  ?  ? ?  ?Exercises   ? ?  ?General Comments   ?  ?  ? ?Pertinent Vitals/Pain Pain Assessment ?Pain Assessment: Faces ?Faces Pain Scale: Hurts little more ?Pain Location: right posterior neck ?Pain Descriptors / Indicators: Sore ?Pain Intervention(s): Monitored during session, Repositioned  ? ? ?Home Living   ?  ?  ?  ?  ?  ?  ?  ?  ?  ?   ?  ?Prior Function    ?  ?  ?   ? ?PT Goals (current goals can now be found in the care  plan section) Progress towards PT goals: Progressing toward goals ? ?  ?Frequency ? ? ? 7X/week ? ? ? ?  ?PT Plan Current plan remains appropriate  ? ? ?Co-evaluation   ?  ?  ?  ?  ? ?  ?AM-PAC PT "6 Clicks" Mobility   ?Outcome Measure ? Help needed turning from your back to your side while in a flat bed without using bedrails?: None ?Help needed moving from lying on your back to sitting on the side of a flat bed without using bedrails?: None ?Help needed moving to and from a bed to a chair (including a wheelchair)?: None ?Help needed standing up from a chair using your arms (e.g., wheelchair or bedside chair)?: None ?Help needed to walk in hospital room?: None ?Help needed climbing 3-5 steps with a railing? : A Little ?6  Click Score: 23 ? ?  ?End of Session   ?  ?Patient left: in bed ?Nurse Communication: Mobility status ?PT Visit Diagnosis: Difficulty in walking, not elsewhere classified (R26.2);Unsteadiness on feet (R26.81);Pain ?  ? ? ?Time: 1610-9604 ?PT Time Calculation (min) (ACUTE ONLY): 8 min ? ?Charges:  $Gait Training: 8-22 mins          ?         Chesley Noon, PTA ?01/24/22, 10:44 AM ? ?

## 2022-01-24 NOTE — Discharge Instructions (Signed)
?  Your surgeon has performed an operation on your neck. Many times, patients feel better immediately after surgery and can ?overdo it.? Even if you feel well, it is important that you follow these activity guidelines. The following are instructions to help in your recovery once you have been discharged from the hospital. ? ?* OK to take NSAIDs as prescribed. ? ?Activity  ?  ?No bending, lifting, or twisting (?BLT?). Avoid lifting objects heavier than 10 pounds (gallon milk jug).  Where possible, avoid household activities that involve lifting, bending, pushing, or pulling such as laundry, vacuuming, grocery shopping, and childcare. Try to arrange for help from friends and family for these activities while your back heals. ? ?Increase physical activity slowly as tolerated.  Taking short walks is encouraged, but avoid strenuous exercise. Do not jog, run, bicycle, lift weights, or participate in any other exercises unless specifically allowed by your doctor. Avoid prolonged sitting, including car rides. ? ?Talk to your doctor before resuming sexual activity. ? ?You should not drive until cleared by your doctor. ? ?Until released by your doctor, you should not return to work or school.  You should rest at home and let your body heal.  ? ?You may shower at any time after your surgery.  After showering, lightly dab your incision dry. Do not take a tub bath or go swimming for 3 weeks, or until approved by your doctor at your follow-up appointment. ? ?If you smoke, we strongly recommend that you quit.  Smoking has been proven to interfere with normal healing in your back and will dramatically reduce the success rate of your surgery. Please contact [facility] (800-QUIT-NOW) and use the resources at www.[facility].com for assistance in stopping smoking. ? ?Surgical Incision ?  ?If you have a dressing on your incision, you may remove it three days after your surgery. Keep your incision area clean and dry. ? ?If you have  staples or stitches on your incision, you should have a follow up scheduled for removal.  ?Diet          ? ? You may return to your usual diet. Be sure to stay hydrated. ? ?When to Contact us ? ?Although your surgery and recovery will likely be uneventful, you may have some residual numbness, aches, and pains in your back and/or legs. This is normal and should improve in the next few weeks. ? ?However, should you experience any of the following, contact us immediately: ?New numbness or weakness ?Pain that is progressively getting worse, and is not relieved by your pain medications or rest ?Bleeding, redness, swelling, pain, or drainage from surgical incision ?Chills or flu-like symptoms ?Fever greater than 101.0 F (38.3 C) ?Problems with bowel or bladder functions ?Difficulty breathing or shortness of breath ?Warmth, tenderness, or swelling in your calf ? ?Contact Information ?During office hours (Monday-Friday 9 am to 5 pm), please call your physician at [phone] ?After hours and weekends, please call [phone] and speak with the answering service, who will contact the doctor on call.  If that fails, call the [facility] Operator at [phone] and ask for the Neurosurgery Resident On Call  ?For a life-threatening emergency, call 911 ? ?

## 2022-01-24 NOTE — Progress Notes (Signed)
OT Cancellation Note ? ?Patient Details ?Name: Eileen Chaney ?MRN: [id number] ?DOB: [birth year] ? ? ?Cancelled Treatment:    Reason Eval/Treat Not Completed: Other (comment). Chart reviewed, arrived to review cervical precautions and pt reports no assist needed and good recall. Handout provided and reviewed. Will sign off.  ? ?Dessie Coma, M.S. OTR/L  ?01/24/22, 9:34 AM  ?ascom [phone] ? ?

## 2022-01-24 NOTE — Progress Notes (Signed)
0930 ?D/c avs reviewed with pt all questions and concerns answered all IVs removed. PICC line in place to right arm. Pt will call when ride is here. ?

## 2022-01-24 NOTE — Progress Notes (Signed)
? ?   Attending Progress Note ? ?History: Eileen Chaney is s/p posterior cervical wound washout ? ?POD4: Continued pain overnight.   ? ?POD3: right sided neck pain.  ? ?POD2: Complaints of diffuse itching and neck pain ? ?POD1: complaints of neck pain overnight. Improved this morning ? ?Physical Exam: ?Vitals:  ? 01/23/22 2032 01/24/22 0629  ?BP: 110/69 100/60  ?Pulse: 89 84  ?Resp: 16 16  ?Temp: 98.1 ?F (36.7 ?C) 98 ?F (36.7 ?C)  ?SpO2: 99% 98%  ? ?A&Ox3 ?CNI ? ?Strength:5/5 throughout Eileen Chaney ?Incision c/d/i ? ?Data: ? ?Recent Labs  ?Lab 01/20/22 ?0825 01/21/22 ?5885 01/22/22 ?0919  ?NA 134*  --   --   ?K 3.9  --   --   ?CL 103  --   --   ?CO2 24  --   --   ?BUN 8  --   --   ?CREATININE 0.71   < > 0.53  ?GLUCOSE 116*  --   --   ?CALCIUM 9.0  --   --   ? < > = values in this interval not displayed.  ? ?No results for input(s): AST, ALT, ALKPHOS in the last 168 hours. ? ?Invalid input(s): TBILI  ? Recent Labs  ?Lab 01/20/22 ?0825  ?WBC 8.4  ?HGB 11.8*  ?HCT 37.8  ?PLT 283  ? ?Recent Labs  ?Lab 01/20/22 ?0277  ?INR 1.1  ?  ?   ? ? ?Other tests/results:  ?Cultures positive for Morgenella  ? ?Assessment/Plan: ? ?Eileen Chaney is a 46 y.o s/p posterior cervical wound washout on 01/20/22.  ? ?- mobilize ?- pain control ?- DVT prophylaxis ?- PTOT ?- ID has given final recommendations - will set up for home ? ?Meade Maw MD ?Department of Neurosurgery ? ?  ?

## 2022-01-25 LAB — CULTURE, BLOOD (ROUTINE X 2): Culture: NO GROWTH

## 2022-01-26 LAB — CULTURE, BLOOD (ROUTINE X 2)
Culture: NO GROWTH
Special Requests: ADEQUATE

## 2022-01-28 LAB — ANAEROBIC CULTURE W GRAM STAIN

## 2022-01-29 ENCOUNTER — Encounter: Payer: Self-pay | Admitting: Infectious Diseases

## 2022-02-12 ENCOUNTER — Encounter: Payer: Self-pay | Admitting: Infectious Diseases

## 2022-02-12 ENCOUNTER — Ambulatory Visit: Payer: BC Managed Care – PPO | Attending: Infectious Diseases | Admitting: Infectious Diseases

## 2022-02-12 VITALS — BP 104/70 | HR 91 | Temp 96.6°F | Ht 68.0 in | Wt 253.0 lb

## 2022-02-12 DIAGNOSIS — T8140XA Infection following a procedure, unspecified, initial encounter: Secondary | ICD-10-CM | POA: Insufficient documentation

## 2022-02-12 DIAGNOSIS — E119 Type 2 diabetes mellitus without complications: Secondary | ICD-10-CM | POA: Diagnosis not present

## 2022-02-12 DIAGNOSIS — I10 Essential (primary) hypertension: Secondary | ICD-10-CM | POA: Insufficient documentation

## 2022-02-12 DIAGNOSIS — T8149XA Infection following a procedure, other surgical site, initial encounter: Secondary | ICD-10-CM | POA: Diagnosis not present

## 2022-02-12 DIAGNOSIS — Y838 Other surgical procedures as the cause of abnormal reaction of the patient, or of later complication, without mention of misadventure at the time of the procedure: Secondary | ICD-10-CM | POA: Diagnosis not present

## 2022-02-12 DIAGNOSIS — B999 Unspecified infectious disease: Secondary | ICD-10-CM | POA: Diagnosis present

## 2022-02-12 DIAGNOSIS — E785 Hyperlipidemia, unspecified: Secondary | ICD-10-CM | POA: Diagnosis not present

## 2022-02-12 NOTE — Progress Notes (Signed)
NAME: Eileen Chaney  ?DOB: 28-Dec-1975  ?MRN: [id number]  ?Date/Time: 02/12/2022 11:09 AM ? ? ?Subjective:  ? ?Eileen Chaney is a 46 y.o. with a history of diabetes mellitus, hypertension, hyperlipidemia, inflammatory arthropathy/fibromyalgia, C3-C5 ACDF on 11/12/2021 followed by posterior decompression C5-C6 C6-C7 on 01/06/8118 complicated by infection ?Follow-up visit after recent hospitalization.  Patient was hospitalized at [facility] from 01/20/2022 until 01/24/2022 for a wound drainage from a posterior cervical decompression site that she had on.  01/17/2022 which was a part of first two-stage operation.  She had undergone anterior cervical fusion in February 2023 and that wound had healed completely.  She underwent washout on 01/20/2022.  Pus was found deep to the fascia.  After debridement she had a drain placed for 24 hours. ?Culture was positive for Morganella.  She was discharged home on IV cefepime and daptomycin.  To continue 6 weeks of antibiotics until 03/02/2022.  The daptomycin was given because patient was getting linezolid before her surgery and not sure whether MRSA was suppressed because of the linezolid.   ?Patient has been doing better ?Cervical wound is healing well ?She has no fever ?She has some fatigue ?She feels very sleepy ?She has underlying fibromyalgia and possible inflammatory arthropathy questioning ankylosing spondylitis.  Not on any medications currently. ?She works at [employer].  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 ?She has been taking her medications regularly ?She has some nausea ? ?Past Medical History:  ?Diagnosis Date  ? Anxiety   ? Arthritis   ? Asthma   ? Cervical myelopathy with cervical radiculopathy (HCC)   ? Complication of anesthesia   ? DDD (degenerative disc disease), cervical   ? Depression   ? Diabetes mellitus without complication ([place])   ? type 2  ? Family history of adverse reaction to anesthesia   ? mother hard to wake up  ? Hyperlipidemia   ?  Hypertension   ? Idiopathic urticaria   ? Pneumonia   ? PONV (postoperative nausea and vomiting)   ?  ?Past Surgical History:  ?Procedure Laterality Date  ? ABDOMINAL HYSTERECTOMY  2018  ? uterine fibroids  ? ANKLE SURGERY Right 2022  ? ligament repair  ? ANTERIOR CERVICAL DECOMP/DISCECTOMY FUSION N/A 11/12/2021  ? Procedure: C3-5 ANTERIOR CERVICAL DISCECTOMY AND FUSION (GLOBUS HEDRON);  Surgeon: Meade Maw, MD;  Location: [facility] ORS;  Service: Neurosurgery;  Laterality: N/A;  ? CERVICAL WOUND DEBRIDEMENT N/A 01/20/2022  ? Procedure: posterior wound debridment;  Surgeon: Meade Maw, MD;  Location: [facility] ORS;  Service: Neurosurgery;  Laterality: N/A;  ? CESAREAN SECTION    ? X3 2000, 2001, 2009  ? POSTERIOR CERVICAL LAMINECTOMY N/A 01/07/2022  ? Procedure: OPEN C5-7 POSTERIOR DECOMPRESSION;  Surgeon: Meade Maw, MD;  Location: [facility] ORS;  Service: Neurosurgery;  Laterality: N/A;  ? TONSILLECTOMY  2000  ?  ?Social History  ? ?Socioeconomic History  ? Marital status: Divorced  ?  Spouse name: Not on file  ? Number of children: 3  ? Years of education: Not on file  ? Highest education level: Not on file  ?Occupational History  ? Not on file  ?Tobacco Use  ? Smoking status: Never  ? Smokeless tobacco: Never  ?Vaping Use  ? Vaping Use: Never used  ?Substance and Sexual Activity  ? Alcohol use: Yes  ?  Alcohol/week: 0.0 - 1.0 standard drinks  ?  Comment: rarely  ? Drug use: Not Currently  ? Sexual activity: Yes  ?Other Topics Concern  ?  Not on file  ?Social History Narrative  ? Lives with children  ? ?Social Determinants of Health  ? ?Financial Resource Strain: Not on file  ?Food Insecurity: Not on file  ?Transportation Needs: Not on file  ?Physical Activity: Not on file  ?Stress: Not on file  ?Social Connections: Not on file  ?Intimate Partner Violence: Not on file  ?  ?No family history on file. ?Allergies  ?Allergen Reactions  ? Acetaminophen Itching  ?  Eyes throat swelling  ? Other Hives  ?  Per  patient has idiopathic urticaria  ? Dilaudid [Hydromorphone Hcl] Itching  ? ?I? ?Current Outpatient Medications  ?Medication Sig Dispense Refill  ? albuterol (VENTOLIN HFA) 108 (90 Base) MCG/ACT inhaler 1-2 puffs every 4-6 hours as needed for cough or wheezing. Rinse mouth after use 1 each 1  ? ceFEPime (MAXIPIME) IVPB Inject 2 g into the vein every 8 (eight) hours. Indication: cervical vertebrae post-op site infection ?First Dose: Yes ?Last Day of Therapy:  03/02/2022 ?Labs - Once weekly:  CBC/D, CMP, ESR and CPK ?Please pull PIC at completion of IV antibiotics ?Fax weekly labs to [phone] ?Method of administration: IV Push ?Method of administration may be changed at the discretion of home infusion pharmacist based upon assessment of the patient and/or caregiver's ability to self-administer the medication ordered. 115 Units 0  ? cetirizine (ZYRTEC) 10 MG tablet Take 10 mg by mouth daily.    ? daptomycin (CUBICIN) IVPB Inject 500 mg into the vein daily. Indication: cervical vertebrae post-op site infection ?First Dose: Yes ?Last Day of Therapy:  03/02/2022 ?Labs - Once weekly:  CBC/D, CMP, ESR and CPK ?Please pull PIC at completion of IV antibiotics ?Fax weekly labs to [phone] ?Method of administration: IV Push ?Method of administration may be changed at the discretion of home infusion pharmacist based upon assessment of the patient and/or caregiver's ability to self-administer the medication ordered. 38 Units 0  ? diclofenac Sodium (VOLTAREN) 1 % GEL Apply 2 g topically daily as needed (pain).    ? Dulaglutide (TRULICITY) 3 MV/7.8IO SOPN Inject 3 mg into the skin every Saturday.    ? EPINEPHrine 0.3 mg/0.3 mL IJ SOAJ injection Inject 0.3 mg into the muscle as needed for anaphylaxis.    ? fluconazole (DIFLUCAN) 150 MG tablet Take 1 tablet (150 mg total) by mouth once daily for 7 doses May repeat x1 if symptoms recur.  Refill on prescription.    ? lisinopril (ZESTRIL) 10 MG tablet Take 1 tablet (10 mg total)  by mouth daily. 90 tablet 0  ? Multiple Vitamin (MULTI-VITAMIN) tablet Take 1 tablet by mouth daily.    ? NALTREXONE HCL PO Take 4.5 mg by mouth daily.    ? ondansetron (ZOFRAN-ODT) 4 MG disintegrating tablet Take 1 tablet (4 mg total) by mouth every 8 (eight) hours as needed for Nausea for up to 15 days    ? spironolactone (ALDACTONE) 50 MG tablet Take 50 mg by mouth daily.    ? atorvastatin (LIPITOR) 40 MG tablet Take 1 tablet (40 mg total) by mouth daily. (Patient not taking: Reported on 02/12/2022) 90 tablet 0  ? celecoxib (CELEBREX) 100 MG capsule Take 100 mg by mouth 2 (two) times daily as needed for moderate pain. (Patient not taking: Reported on 02/12/2022)    ? methocarbamol 1000 MG TABS Take 1,000 mg by mouth 3 (three) times daily. (Patient not taking: Reported on 02/12/2022) 90 tablet 0  ? sertraline (ZOLOFT) 100 MG tablet Take 2  tablets (200 mg total) by mouth daily. (Patient taking differently: Take 200 mg by mouth at bedtime.) 90 tablet 0  ? ?No current facility-administered medications for this visit.  ?  ? ?Abtx:  ?Anti-infectives (From admission, onward)  ? ? None  ? ?  ? ? ?REVIEW OF SYSTEMS:  ?Const: negative fever, negative chills, negative weight loss ?Eyes: negative diplopia or visual changes, negative eye pain ?ENT: negative coryza, negative sore throat ?Resp: negative cough, hemoptysis, dyspnea ?Cards: negative for chest pain, palpitations, lower extremity edema ?GU: negative for frequency, dysuria and hematuria ?GI: Negative for abdominal pain, diarrhea, bleeding, constipation ?Skin: negative for rash and pruritus ?Heme: negative for easy bruising and gum/nose bleeding ?MS: Fatigue ?Neurolo:negative for headaches, dizziness, vertigo, memory problems  ?Psych: negative for feelings of anxiety, depression  ?Endocrine:  diabetes ?Allergy/Immunology-as above  ?Objective:  ?VITALS:  ?BP 104/70   Pulse 91   Temp (!) 96.6 ?F (35.9 ?C) (Temporal)   Ht _0  (1.727 m)   Wt 253 lb (114.8 kg)   BMI  38.47 kg/m?  ?LDA ?Right PICC line ?Site looks clean ?PHYSICAL EXAM:  ?General: Alert, cooperative, no distress, appears stated age.  ?Head: Normocephalic, without obvious abnormality, atraumatic. ?Eyes: Conjunctivae clea

## 2022-02-12 NOTE — Patient Instructions (Signed)
You are here for follow up of the cervical laminectomy site infection- you had morganella- you are on cefepime /Daptomycin- you will continue till 03/02/22 . Your labs are stable- you will continue with IV antibiotic until 6/3 and then PICC can be removed. Follow PRN ?

## 2022-03-08 ENCOUNTER — Emergency Department
Admission: EM | Admit: 2022-03-08 | Discharge: 2022-03-08 | Disposition: A | Discharge status: Home / Self Care | Payer: BC Managed Care – PPO | Attending: Emergency Medicine | Admitting: Emergency Medicine

## 2022-03-08 ENCOUNTER — Other Ambulatory Visit: Payer: Self-pay

## 2022-03-08 DIAGNOSIS — R519 Headache, unspecified: Secondary | ICD-10-CM | POA: Insufficient documentation

## 2022-03-08 DIAGNOSIS — R197 Diarrhea, unspecified: Secondary | ICD-10-CM | POA: Insufficient documentation

## 2022-03-08 DIAGNOSIS — Z5321 Procedure and treatment not carried out due to patient leaving prior to being seen by health care provider: Secondary | ICD-10-CM | POA: Diagnosis not present

## 2022-03-08 DIAGNOSIS — R109 Unspecified abdominal pain: Secondary | ICD-10-CM | POA: Diagnosis present

## 2022-03-08 LAB — COMPREHENSIVE METABOLIC PANEL
ALT: 15 U/L (ref 0–44)
AST: 17 U/L (ref 15–41)
Albumin: 3.9 g/dL (ref 3.5–5.0)
Alkaline Phosphatase: 90 U/L (ref 38–126)
Anion gap: 6 (ref 5–15)
BUN: 11 mg/dL (ref 6–20)
CO2: 25 mmol/L (ref 22–32)
Calcium: 9.8 mg/dL (ref 8.9–10.3)
Chloride: 108 mmol/L (ref 98–111)
Creatinine, Ser: 0.55 mg/dL (ref 0.44–1.00)
GFR, Estimated: >60 mL/min (ref >60)
Glucose, Bld: 104 mg/dL — ABNORMAL HIGH (ref 70–99)
Potassium: 3.9 mmol/L (ref 3.5–5.1)
Sodium: 139 mmol/L (ref 135–145)
Total Bilirubin: 0.3 mg/dL (ref 0.3–1.2)
Total Protein: 8.3 g/dL — ABNORMAL HIGH (ref 6.5–8.1)

## 2022-03-08 LAB — CBC
HCT: 38.1 % (ref 36.0–46.0)
Hemoglobin: 11.9 g/dL — ABNORMAL LOW (ref 12.0–15.0)
MCH: 25.3 pg — ABNORMAL LOW (ref 26.0–34.0)
MCHC: 31.2 g/dL (ref 30.0–36.0)
MCV: 81.1 fL (ref 80.0–100.0)
Platelets: 295 10*3/uL (ref 150–400)
RBC: 4.7 MIL/uL (ref 3.87–5.11)
RDW: 14.7 % (ref 11.5–15.5)
WBC: 9 10*3/uL (ref 4.0–10.5)
nRBC: 0 % (ref 0.0–0.2)

## 2022-03-08 LAB — LIPASE, BLOOD: Lipase: 38 U/L (ref 11–51)

## 2022-03-08 LAB — LACTIC ACID, PLASMA: Lactic Acid, Venous: 1 mmol/L (ref 0.5–1.9)

## 2022-03-08 NOTE — ED Notes (Signed)
Pt stating she was sent here by urgent care, pt stating that she does not feel like she needs to be here and wants to leave.

## 2022-03-08 NOTE — ED Triage Notes (Signed)
Pt to ED via [facility]. Pt reports abdominal pain, HA and diarrhea x8 days. Pt recently on IV antibiotics and called PCP for evaluation and told to come to ED.   Pt PICC line removed last week. Pt was on IV antibiotic for morganella infection. Pt also reports she had to come off the IV antibiotics early due to a allergic reaction.

## 2022-03-08 NOTE — ED Provider Notes (Signed)
Emergency department brief note  Patient is a 46 year old female who presents from [facility] after being seen today for headache, diarrhea, and abdominal pain for the last 8 days after being on IV antibiotics for a reported Morganella infection.  Patient states that she had a PICC line removed last week after she had an allergic reaction to the IV antibiotics.  Patient does not know how many more days of antibiotics she had.  Labs placed and plan for CT abdomen/pelvis however prior to this scan or completing her work-up, patient states that she wanted to leave due to feeling "I do not need to be here".  I offered patient continued work-up however she states that she is "ready to go".    This patient has elected to leave against medical advice. In my opinion, the patient has capacity to leave AMA. The patient is clinically sober, free from distracting injury, appears to have intact insight and judgment and reason, and in my opinion has capacity to make decisions. I explained to the patient that his symptoms may represent intra-abdominal infection and the patient verbalized understanding of my concerns.  I informed the patient that the next step in diagnosis and treatment would be a CT abdomen/pelvis and urinalysis, and they verbalized understanding of this as well. I explained the risks of leaving without further workup or treatment, which included reasonably foreseeable complications such as death, serious injury, permanent disability, and worsening diarrhea. I also offered alternatives to departing [place] such as assigning the patient a different provider or an alternate workup pathway.  The patient is refusing any further care, specifically any CT or urinalysis, and is leaving against medical advice. I am unable to convince the patient to stay. I have asked them to return as soon as possible to complete their evaluation, and also explained that they were welcome to return to the ER for further  evaluation whenever they choose. I have asked the patient to follow up with their primary doctor as soon as possible. I have answered all their questions. Patient signed [facility] paperwork.   Naaman Plummer, MD 03/08/22 [phone]

## 2022-03-09 ENCOUNTER — Other Ambulatory Visit: Payer: Self-pay

## 2022-03-09 ENCOUNTER — Encounter: Payer: Self-pay | Admitting: Intensive Care

## 2022-03-09 ENCOUNTER — Emergency Department
Admission: EM | Admit: 2022-03-09 | Discharge: 2022-03-09 | Disposition: A | Discharge status: Home / Self Care | Payer: BC Managed Care – PPO | Attending: Emergency Medicine | Admitting: Emergency Medicine

## 2022-03-09 ENCOUNTER — Emergency Department: Payer: BC Managed Care – PPO

## 2022-03-09 DIAGNOSIS — R197 Diarrhea, unspecified: Secondary | ICD-10-CM | POA: Diagnosis not present

## 2022-03-09 DIAGNOSIS — R1084 Generalized abdominal pain: Secondary | ICD-10-CM | POA: Insufficient documentation

## 2022-03-09 DIAGNOSIS — R110 Nausea: Secondary | ICD-10-CM | POA: Diagnosis not present

## 2022-03-09 DIAGNOSIS — E119 Type 2 diabetes mellitus without complications: Secondary | ICD-10-CM | POA: Diagnosis not present

## 2022-03-09 DIAGNOSIS — I10 Essential (primary) hypertension: Secondary | ICD-10-CM | POA: Diagnosis not present

## 2022-03-09 DIAGNOSIS — R1011 Right upper quadrant pain: Secondary | ICD-10-CM | POA: Diagnosis not present

## 2022-03-09 LAB — URINALYSIS, ROUTINE W REFLEX MICROSCOPIC
Bilirubin Urine: NEGATIVE
Glucose, UA: NEGATIVE mg/dL
Hgb urine dipstick: NEGATIVE
Ketones, ur: NEGATIVE mg/dL
Leukocytes,Ua: NEGATIVE
Nitrite: NEGATIVE
Protein, ur: NEGATIVE mg/dL
Specific Gravity, Urine: 1.014 (ref 1.005–1.030)
pH: 5 (ref 5.0–8.0)

## 2022-03-09 MED ORDER — ONDANSETRON 4 MG PO TBDP: 4.0000 mg | ORAL_TABLET | Freq: Three times a day (TID) | ORAL | 0 refills | Status: DC | PRN

## 2022-03-09 MED ORDER — KETOROLAC TROMETHAMINE 15 MG/ML IJ SOLN
15.0000 mg | Freq: Once | INTRAMUSCULAR | Status: AC
  Administered 2022-03-09: 15 mg via INTRAVENOUS
  Filled 2022-03-09: qty 1

## 2022-03-09 MED ORDER — ONDANSETRON HCL 4 MG/2ML IJ SOLN
4.0000 mg | Freq: Once | INTRAMUSCULAR | Status: AC
  Administered 2022-03-09: 4 mg via INTRAVENOUS
  Filled 2022-03-09: qty 2

## 2022-03-09 MED ORDER — IOHEXOL 300 MG/ML  SOLN
100.0000 mL | Freq: Once | INTRAMUSCULAR | Status: AC | PRN
  Administered 2022-03-09: 100 mL via INTRAVENOUS

## 2022-03-09 MED ORDER — SODIUM CHLORIDE 0.9 % IV BOLUS
1000.0000 mL | Freq: Once | INTRAVENOUS | Status: AC
  Administered 2022-03-09: 1000 mL via INTRAVENOUS

## 2022-03-09 MED ORDER — FAMOTIDINE 20 MG PO TABS: 20.0000 mg | ORAL_TABLET | Freq: Two times a day (BID) | ORAL | 0 refills | Status: DC

## 2022-03-09 NOTE — ED Notes (Signed)
Pt A&O, IV removed, pt given discharge instructions, pt ambulating with steady gait. 

## 2022-03-09 NOTE — ED Provider Notes (Signed)
----------------------------------------- 7:52 PM on 03/09/2022 -----------------------------------------  Blood pressure 131/85, pulse 60, temperature 98.5 F (36.9 C), temperature source Oral, resp. rate 16, height '5\' 8"'$  (1.727 m), weight 114.3 kg, SpO2 95 %.  Assuming care from Dr. Joni Fears, PA-C/NP-C.  In short, Eileen Chaney is a 46 y.o. female with a chief complaint of Abdominal Pain .  Refer to the original H&P for additional details.  The current plan of care is to await [place] and discharge home to follow-up with PCP.  ____________________________________________   LABS (pertinent positives/negatives) Labs Reviewed  URINALYSIS, ROUTINE W REFLEX MICROSCOPIC - Abnormal; Notable for the following components:      Result Value   Color, Urine YELLOW (*)    APPearance HAZY (*)    All other components within normal limits  C DIFFICILE QUICK SCREEN W PCR REFLEX    GASTROINTESTINAL PANEL BY PCR, STOOL (REPLACES STOOL CULTURE)   ____________________________________________  EKG  ____________________________________________   RADIOLOGY US ABDOMEN LIMITED RUQ (LIVER/GB)  Result Date: 03/09/2022 CLINICAL DATA:  Right upper quadrant pain. EXAM: ULTRASOUND ABDOMEN LIMITED RIGHT UPPER QUADRANT COMPARISON:  None Available. FINDINGS: Gallbladder: No gallstones or wall thickening visualized (2.8 mm). No sonographic Murphy sign noted by sonographer. Common bile duct: Diameter: 4.5 mm Liver: No focal lesion identified. Diffusely increased echogenicity of the liver parenchyma is noted. Portal vein is patent on color Doppler imaging with normal direction of blood flow towards the liver. Other: None. IMPRESSION: Hepatic steatosis without focal liver lesions. Electronically Signed   By: Virgina Norfolk M.D.   On: 03/09/2022 20:08   CT ABDOMEN PELVIS W CONTRAST  Result Date: 03/09/2022 CLINICAL DATA:  Abdominal pain EXAM: CT ABDOMEN AND PELVIS WITH CONTRAST TECHNIQUE: Multidetector CT  imaging of the abdomen and pelvis was performed using the standard protocol following bolus administration of intravenous contrast. RADIATION DOSE REDUCTION: This exam was performed according to the departmental dose-optimization program which includes automated exposure control, adjustment of the mA and/or kV according to patient size and/or use of iterative reconstruction technique. CONTRAST:  142m OMNIPAQUE IOHEXOL 300 MG/ML  SOLN COMPARISON:  None Available. FINDINGS: Lower chest: No acute abnormality. Hepatobiliary: Liver is enlarged measuring 19.1 cm in length. Subtle hypodensity adjacent to the falciform ligament likely represents focal fatty infiltration. Gallbladder appears normal. No biliary ductal dilatation identified. Pancreas: Unremarkable. No pancreatic ductal dilatation or surrounding inflammatory changes. Spleen: Normal in size without focal abnormality. Adrenals/Urinary Tract: Adrenal glands and kidneys appear normal. Urinary bladder is incompletely distended with mild wall thickening. Stomach/Bowel: Stomach is within normal limits. Appendix appears normal. No evidence of bowel wall thickening, distention, or inflammatory changes. Vascular/Lymphatic: No significant vascular findings are present. No enlarged abdominal or pelvic lymph nodes. Reproductive: Status post hysterectomy. No adnexal masses. Other: No ascites. Musculoskeletal: No suspicious bony lesions identified. IMPRESSION: 1. No acute process identified. 2. Hepatomegaly. Electronically Signed   By: DOfilia NeasM.D.   On: 03/09/2022 18:59   ____________________________________________  PROCEDURES  Procedures ____________________________________________  INITIAL IMPRESSION / ASSESSMENT AND PLAN / ED COURSE   Patient to the ED for evaluation of 1 week of intermittent diarrhea and abdominal pain.  She is evaluated for her complaints in the ED, found have overall reassuring work-up.  Her ultrasound shows no evidence of acute  cholecystitis or biliary duct dilatation.  Patient stable at this time but is given instruction on how to collect a stool sample, but she can present to her PCP for outpatient testing.  Patient is agreeable to the plan of  discharge at this time and follow-up has been discussed. ____________________________________________  FINAL CLINICAL IMPRESSION(S) / ED DIAGNOSES  Final diagnoses:  Generalized abdominal pain  Diarrhea, unspecified type      Carmie End, Dannielle Karvonen, PA-C 03/10/22 0022    Naaman Plummer, MD 03/15/22 [phone]

## 2022-03-09 NOTE — ED Triage Notes (Addendum)
PAtient c/o diarrhea and abdominal ache X8 days. Was recently getting IV antibiotics at home through PICC that was removed last week. Had been getting IV antibiotics for morganella infection. A&O x4 upon arrival.   Patient checked in to ER yesterday but left before being seen.

## 2022-03-09 NOTE — ED Notes (Signed)
Pt given ginger ale and graham crackers.

## 2022-03-09 NOTE — Discharge Instructions (Addendum)
Your labs and CT scan today are all okay. You Zofran as needed for nausea.  Famotidine for stomach upset and acid reflux symptoms. Continue Imodium for diarrhea.  Probiotics/yogurt and fiber supplement such as Metamucil can also help control diarrhea.  Taking an iron supplement can help support reestablishment of your normal intestinal flora and decreased diarrhea.

## 2022-03-09 NOTE — ED Provider Notes (Signed)
[facility] Provider Note    Event Date/Time   First MD Initiated Contact with Patient 03/09/22 1653     (approximate)   History   Chief Complaint: Abdominal Pain   HPI  Eileen Chaney is a 46 y.o. female with a history of diabetes, hypertension who comes ED complaining of generalized abdominal pain, worse in the upper abdomen for the last 8 days associated with diarrhea.  Has some nausea but no vomiting.  Worse with being upright.  Was previously on a prolonged course of antibiotics due to a surgical site infection from recent posterior cervical spine decompression.  Outside records reviewed, noting patient was found to have a Morganella infection, treated with IV antibiotics via PICC until June 1, transition to oral antibiotics which have not been completed.     Physical Exam   Triage Vital Signs: ED Triage Vitals  Enc Vitals Group     BP 03/09/22 1613 131/85     Pulse Rate 03/09/22 1613 60     Resp 03/09/22 1613 16     Temp 03/09/22 1613 98.5 F (36.9 C)     Temp Source 03/09/22 1613 Oral     SpO2 03/09/22 1613 95 %     Weight 03/09/22 1615 252 lb (114.3 kg)     Height 03/09/22 1615 '5\' 8"'$  (1.727 m)     Head Circumference --      Peak Flow --      Pain Score 03/09/22 1613 6     Pain Loc --      Pain Edu? --      Excl. in [place]? --     Most recent vital signs: Vitals:   03/09/22 1613  BP: 131/85  Pulse: 60  Resp: 16  Temp: 98.5 F (36.9 C)  SpO2: 95%    General: Awake, no distress.  CV:  Good peripheral perfusion.  Regular rate rhythm Resp:  Normal effort.  Clear to auscultation bilateral Abd:  No distention.  Soft with rest, worse in the right upper quadrant. Other:  No lower extremity edema, no inflammatory skin changes.  Neck surgical site is completely healed, no inflammatory changes or drainage or tenderness or swelling.   ED Results / Procedures / Treatments   Labs (all labs ordered are listed, but only abnormal  results are displayed) Labs Reviewed  URINALYSIS, ROUTINE W REFLEX MICROSCOPIC - Abnormal; Notable for the following components:      Result Value   Color, Urine YELLOW (*)    APPearance HAZY (*)    All other components within normal limits  C DIFFICILE QUICK SCREEN W PCR REFLEX    GASTROINTESTINAL PANEL BY PCR, STOOL (REPLACES STOOL CULTURE)     EKG    RADIOLOGY CT abdomen pelvis viewed and interpreted by me, no intra-abdominal abscess or diverticulitis.  No free air.  Radiology report reviewed.  Ultrasound right upper quadrant pending   PROCEDURES:  Procedures   MEDICATIONS ORDERED IN ED: Medications  ketorolac (TORADOL) 15 MG/ML injection 15 mg (15 mg Intravenous Given 03/09/22 1829)  ondansetron (ZOFRAN) injection 4 mg (4 mg Intravenous Given 03/09/22 1829)  sodium chloride 0.9 % bolus 1,000 mL (1,000 mLs Intravenous New Bag/Given 03/09/22 1826)  iohexol (OMNIPAQUE) 300 MG/ML solution 100 mL (100 mLs Intravenous Contrast Given 03/09/22 1837)     IMPRESSION / MDM / ASSESSMENT AND PLAN / ED COURSE  I reviewed the triage vital signs and the nursing notes.  Differential diagnosis includes, but is not limited to, appendicitis, pancreatitis, C. difficile colitis, diverticulitis, cholecystitis  Patient's presentation is most consistent with acute presentation with potential threat to life or bodily function.  Patient presents with increasing abdominal pain and diarrhea in the setting of recent prolonged IV antibiotic use.  Vital signs are unremarkable, not septic.  Abdominal exam is not reassuring.  Labs unremarkable, CT obtained which is negative.    Will need to obtain an ultrasound to ensure that there is no gallstone disease.  After ultrasound if patient is able to tolerate oral intake, I think she can be discharged home to continue outpatient follow-up.  Recommend adding fiber supplements and probiotics to help alleviate diarrhea on top of  Imodium that she is already taking.       FINAL CLINICAL IMPRESSION(S) / ED DIAGNOSES   Final diagnoses:  Generalized abdominal pain  Diarrhea, unspecified type     Rx / DC Orders   ED Discharge Orders     None        Note:  This document was prepared using Dragon voice recognition software and may include unintentional dictation errors.   Carrie Mew, MD 03/09/22 1921

## 2022-03-11 ENCOUNTER — Other Ambulatory Visit
Admission: RE | Admit: 2022-03-11 | Discharge: 2022-03-11 | Disposition: A | Discharge status: Home / Self Care | Payer: BC Managed Care – PPO | Source: Ambulatory Visit | Attending: Infectious Diseases | Admitting: Infectious Diseases

## 2022-03-11 DIAGNOSIS — R197 Diarrhea, unspecified: Secondary | ICD-10-CM | POA: Insufficient documentation

## 2022-03-11 LAB — C DIFFICILE QUICK SCREEN W PCR REFLEX
C Diff antigen: NEGATIVE
C Diff interpretation: NOT DETECTED
C Diff toxin: NEGATIVE

## 2022-05-28 ENCOUNTER — Ambulatory Visit: Payer: BC Managed Care – PPO | Admitting: Neurosurgery

## 2022-06-11 ENCOUNTER — Ambulatory Visit: Payer: BC Managed Care – PPO | Admitting: Neurosurgery

## 2022-06-19 ENCOUNTER — Other Ambulatory Visit: Payer: Self-pay

## 2022-06-19 ENCOUNTER — Encounter: Payer: Self-pay | Admitting: Emergency Medicine

## 2022-06-19 ENCOUNTER — Emergency Department
Admission: EM | Admit: 2022-06-19 | Discharge: 2022-06-19 | Discharge status: Against Medical Advice | Payer: BC Managed Care – PPO | Attending: Emergency Medicine | Admitting: Emergency Medicine

## 2022-06-19 DIAGNOSIS — H538 Other visual disturbances: Secondary | ICD-10-CM | POA: Insufficient documentation

## 2022-06-19 DIAGNOSIS — Z5321 Procedure and treatment not carried out due to patient leaving prior to being seen by health care provider: Secondary | ICD-10-CM | POA: Insufficient documentation

## 2022-06-19 DIAGNOSIS — R4781 Slurred speech: Secondary | ICD-10-CM | POA: Insufficient documentation

## 2022-06-19 DIAGNOSIS — R42 Dizziness and giddiness: Secondary | ICD-10-CM | POA: Insufficient documentation

## 2022-06-19 LAB — CBC
HCT: 40.7 % (ref 36.0–46.0)
Hemoglobin: 12.6 g/dL (ref 12.0–15.0)
MCH: 25 pg — ABNORMAL LOW (ref 26.0–34.0)
MCHC: 31 g/dL (ref 30.0–36.0)
MCV: 80.8 fL (ref 80.0–100.0)
Platelets: 270 10*3/uL (ref 150–400)
RBC: 5.04 MIL/uL (ref 3.87–5.11)
RDW: 15.4 % (ref 11.5–15.5)
WBC: 9.4 10*3/uL (ref 4.0–10.5)
nRBC: 0 % (ref 0.0–0.2)

## 2022-06-19 LAB — DIFFERENTIAL
Abs Immature Granulocytes: 0.03 10*3/uL (ref 0.00–0.07)
Basophils Absolute: 0 10*3/uL (ref 0.0–0.1)
Basophils Relative: 0 %
Eosinophils Absolute: 0.1 10*3/uL (ref 0.0–0.5)
Eosinophils Relative: 1 %
Immature Granulocytes: 0 %
Lymphocytes Relative: 37 %
Lymphs Abs: 3.5 10*3/uL (ref 0.7–4.0)
Monocytes Absolute: 0.8 10*3/uL (ref 0.1–1.0)
Monocytes Relative: 8 %
Neutro Abs: 5 10*3/uL (ref 1.7–7.7)
Neutrophils Relative %: 54 %

## 2022-06-19 LAB — PROTIME-INR
INR: 1.1 (ref 0.8–1.2)
Prothrombin Time: 14.3 seconds (ref 11.4–15.2)

## 2022-06-19 LAB — APTT: aPTT: 34 seconds (ref 24–36)

## 2022-06-19 LAB — ETHANOL: Alcohol, Ethyl (B): <10 mg/dL (ref <10)

## 2022-06-19 LAB — COMPREHENSIVE METABOLIC PANEL
ALT: 10 U/L (ref 0–44)
AST: 20 U/L (ref 15–41)
Albumin: 4.4 g/dL (ref 3.5–5.0)
Alkaline Phosphatase: 105 U/L (ref 38–126)
Anion gap: 10 (ref 5–15)
BUN: 14 mg/dL (ref 6–20)
CO2: 25 mmol/L (ref 22–32)
Calcium: 10.1 mg/dL (ref 8.9–10.3)
Chloride: 104 mmol/L (ref 98–111)
Creatinine, Ser: 0.64 mg/dL (ref 0.44–1.00)
GFR, Estimated: >60 mL/min (ref >60)
Glucose, Bld: 116 mg/dL — ABNORMAL HIGH (ref 70–99)
Potassium: 3.6 mmol/L (ref 3.5–5.1)
Sodium: 139 mmol/L (ref 135–145)
Total Bilirubin: 0.4 mg/dL (ref 0.3–1.2)
Total Protein: 8.5 g/dL — ABNORMAL HIGH (ref 6.5–8.1)

## 2022-06-19 NOTE — ED Notes (Signed)
No answer when called several times from lobby 

## 2022-06-19 NOTE — ED Triage Notes (Signed)
First Nurse note:  Arrives from [facility] for ED evaluation for dizziness, gait changes and visual changes since last night.  Patient states symtpoms have be intermittent and ongoing x several months.  Patient is AAOx3.  Skin warm and dry .  Gait steady.  Posture upright and relaxed. NAD.  MAE.

## 2022-06-19 NOTE — ED Triage Notes (Signed)
Pt via POV from [facility]. Pt c/o dizziness, vision changes, states that she felt like her vision is "receding", and slurred speech. States the slurred speech gets worse when the episode is at its worse. States she has been having episodes intermittently for the past couple of months but this particular episode started. Denies pain. States that certain positioning will help with the dizziness. Pt is A&Ox4 and NAD

## 2022-06-28 ENCOUNTER — Ambulatory Visit
Admission: RE | Admit: 2022-06-28 | Discharge: 2022-06-28 | Disposition: A | Discharge status: Home / Self Care | Payer: BC Managed Care – PPO | Source: Ambulatory Visit | Attending: Infectious Diseases | Admitting: Infectious Diseases

## 2022-06-28 ENCOUNTER — Other Ambulatory Visit: Payer: Self-pay | Admitting: Infectious Diseases

## 2022-06-28 ENCOUNTER — Other Ambulatory Visit (HOSPITAL_COMMUNITY): Payer: Self-pay | Admitting: Infectious Diseases

## 2022-06-28 DIAGNOSIS — M79661 Pain in right lower leg: Secondary | ICD-10-CM | POA: Insufficient documentation

## 2022-06-29 IMAGING — MR MR ANKLE*R* W/O CM
6 series · 38 of 40 positions shown · non-contrast
Comparison: None.

CLINICAL DATA: Worsening right ankle and midfoot pain since April 2020. No known injury.

EXAM:
MRI OF THE RIGHT ANKLE WITHOUT CONTRAST
TECHNIQUE: Multiplanar, multisequence MR imaging of the ankle was performed. No
intravenous contrast was administered.

[Series 1: 3 plane loc · axial · right · 6.0mm · 0.59mm/px · z∈[-18,+150]mm · 3 of 11 slices shown]
[im 1/11]
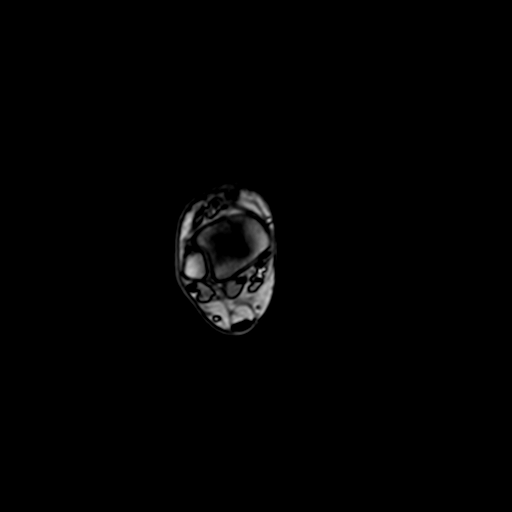
[im 6/11]
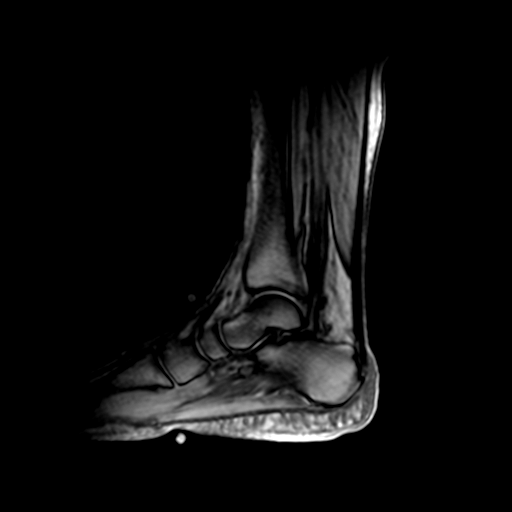
[im 11/11]
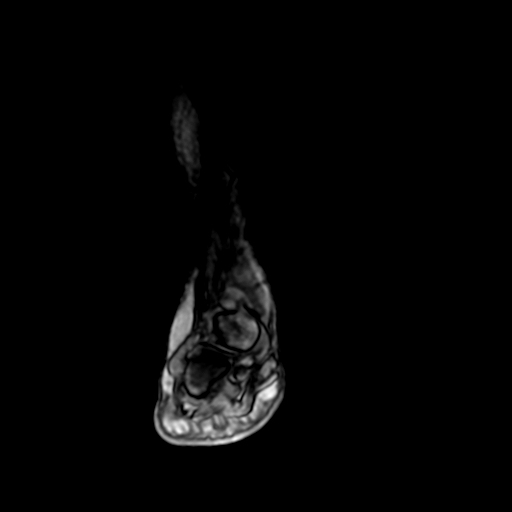

[Series 2: T2 fat-sat · axial · right · 4.0mm · 0.42mm/px · z∈[-120,+45]mm · 8 of 34 slices shown (1 of 2)]
[im 1/34]
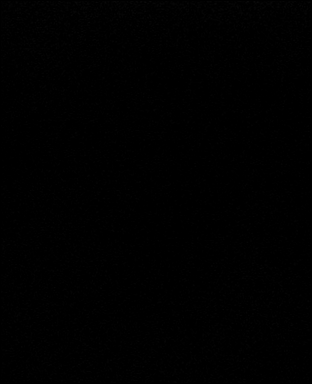
[im 5/34]
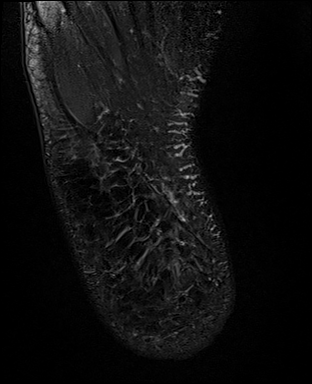
[im 10/34]
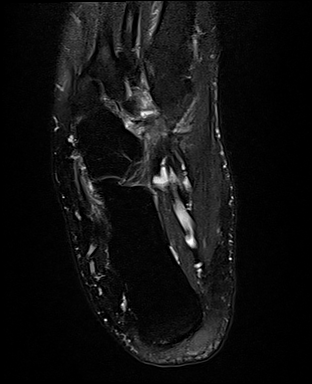
[im 15/34]
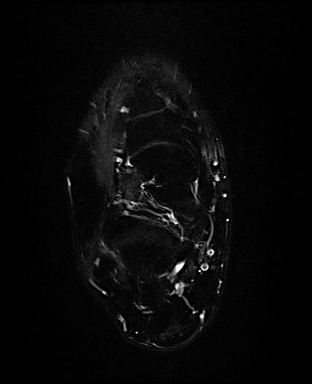
[im 19/34]
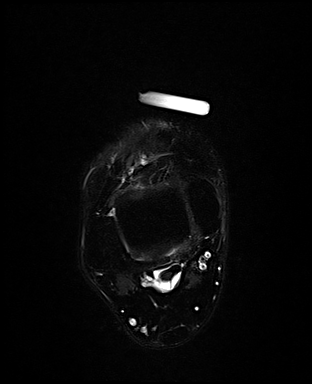
[im 24/34]
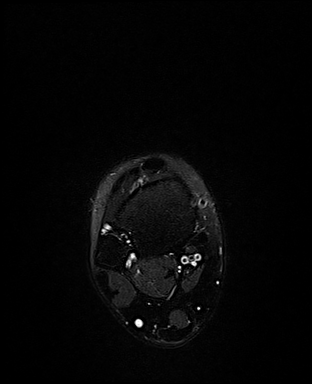
[im 29/34]
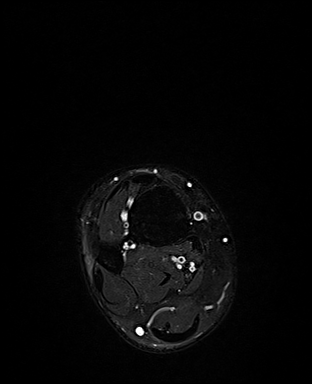
[im 34/34]
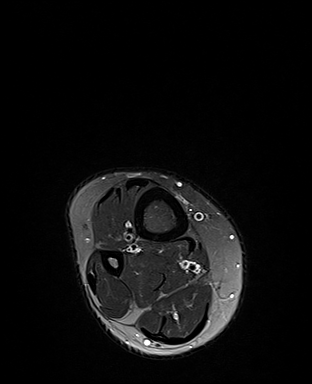

[Series 3: PD fat-sat · axial · right · 4.0mm · 0.50mm/px · z∈[-120,+45]mm · 8 of 34 slices shown]
[im 1/34]
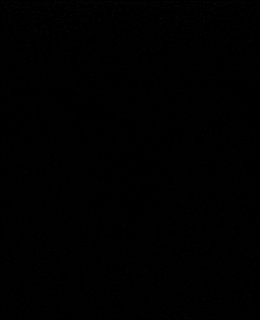
[im 5/34]
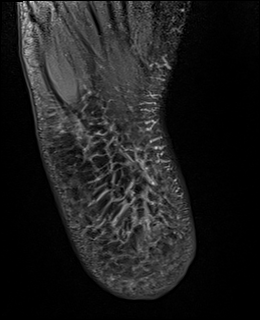
[im 10/34]
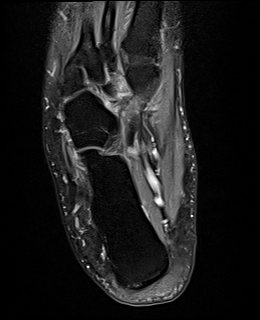
[im 15/34]
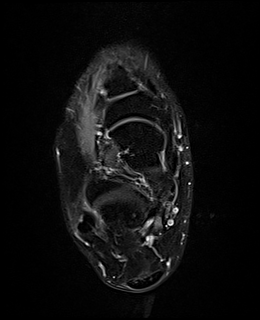
[im 19/34]
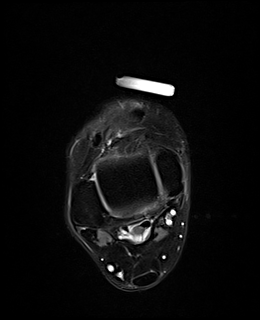
[im 24/34]
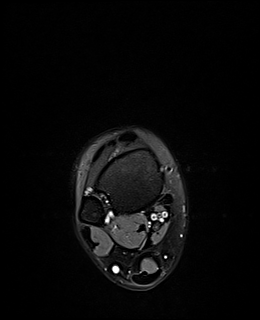
[im 29/34]
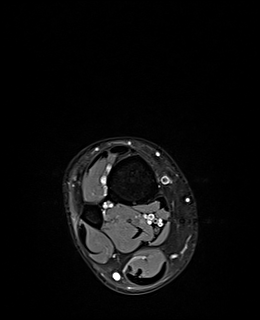
[im 34/34]
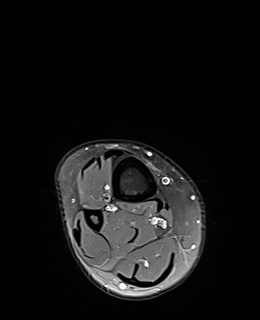

[Series 4: T2 fat-sat · coronal · right · 3.0mm · 0.50mm/px · 9 of 40 slices shown (2 of 2)]
[im 1/40]
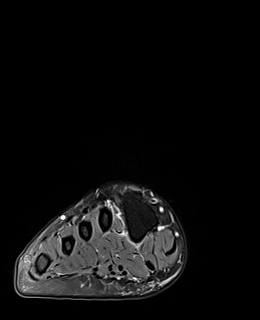
[im 5/40]
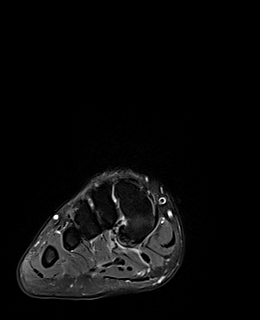
[im 10/40]
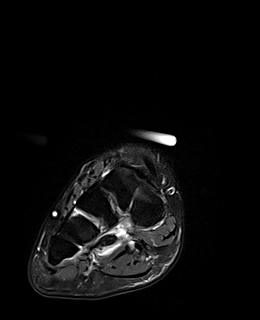
[im 15/40]
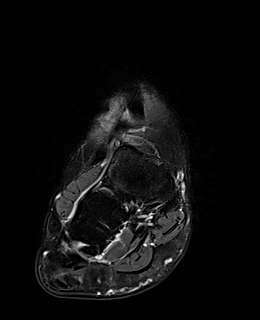
[im 20/40]
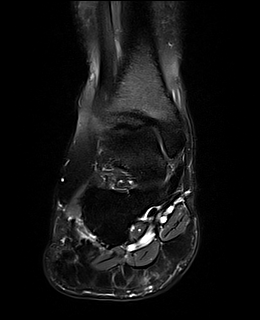
[im 25/40]
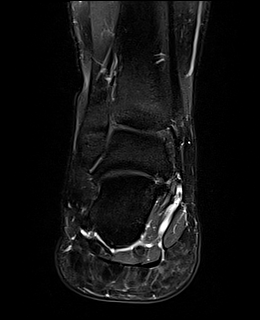
[im 30/40]
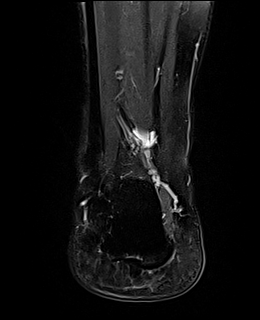
[im 35/40]
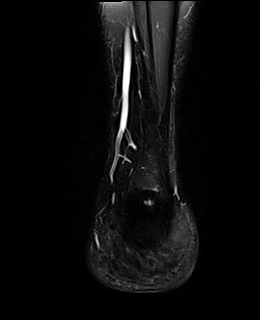
[im 40/40]
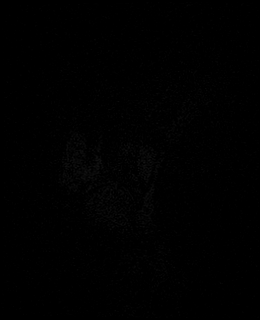

[Series 5: T1 · sagittal · right · 3.0mm · 0.50mm/px · 6 of 25 slices shown]
[im 1/25]
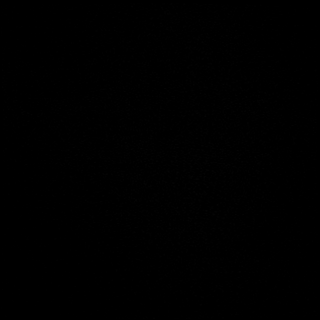
[im 5/25]
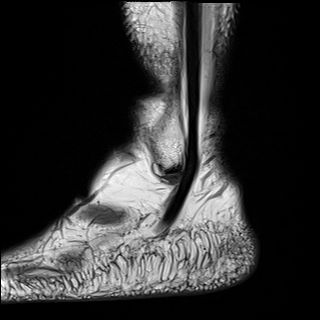
[im 10/25]
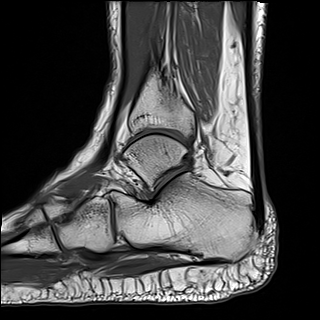
[im 15/25]
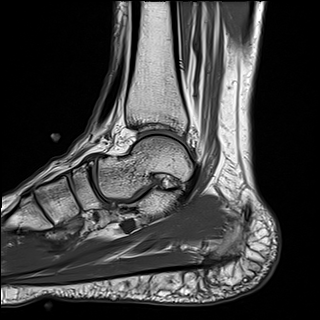
[im 20/25]
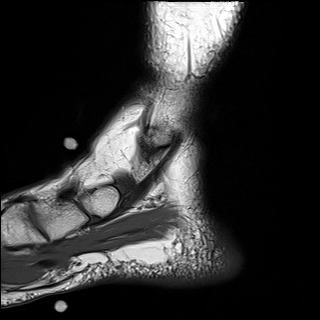
[im 25/25]
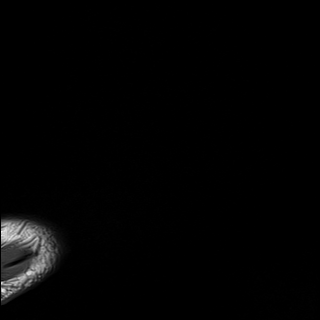

[Series 6: STIR · sagittal · right · 3.0mm · 0.31mm/px · 4 of 25 slices shown]
[im 1/25]
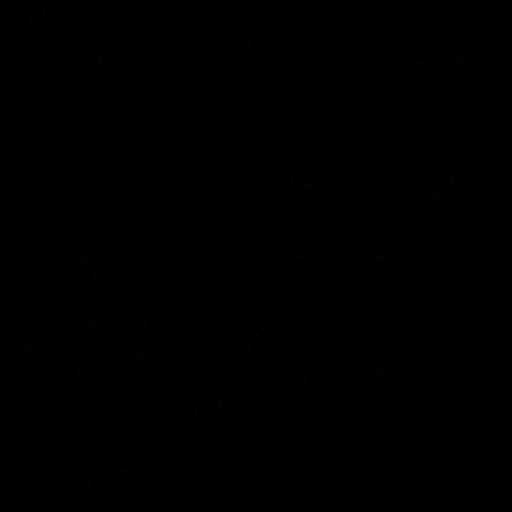
[im 5/25]
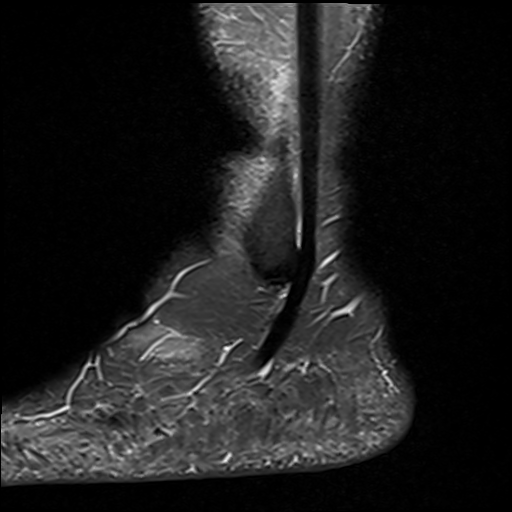
[im 10/25]
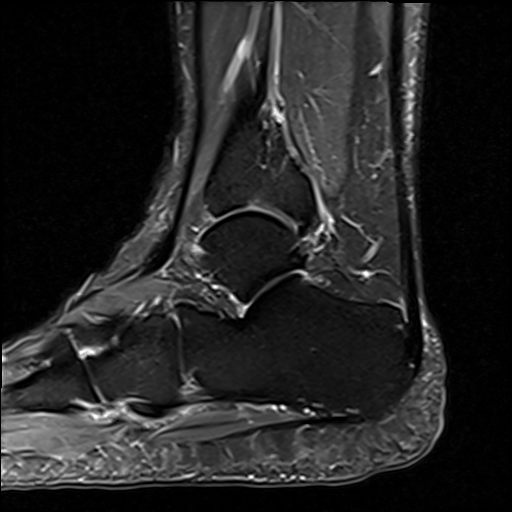
[im 15/25]
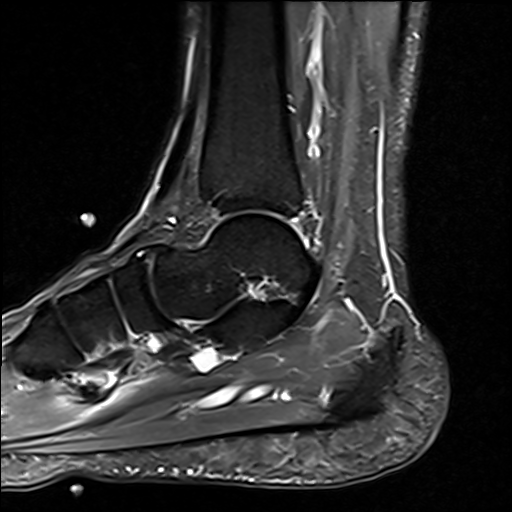

[38 of 40 positions shown; findings below may reference images not displayed]

FINDINGS: TENDONS

Peroneal: Intact.

Posteromedial: Intact.

Anterior: Intact.

Achilles: Intact. There is a small volume of fluid about the distal
tendon compatible with peritendinitis.

Plantar Fascia: Intact.  No evidence of plantar fasciitis.

LIGAMENTS

Lateral: The anterior talofibular ligament is not seen compatible
with tear. No surrounding edema. Lateral ligaments otherwise
negative.

Medial: Intact.

CARTILAGE

Ankle Joint: Normal. No osteochondral lesion of the talar dome or
tibiotalar effusion.

Subtalar Joints/Sinus Tarsi: Normal.

Bones: No fracture, stress change or worrisome lesion.

Other: Markers are placed about. The regions of concern no
underlying fluid collection, mass or other abnormality is
identified.
IMPRESSION: Mild appearing Achilles peritendinitis without tear.

Chronic ATFL tear.

No abnormality is seen in the region of concern.

## 2022-07-02 ENCOUNTER — Other Ambulatory Visit: Payer: Self-pay | Admitting: Family Medicine

## 2022-07-02 DIAGNOSIS — M25512 Pain in left shoulder: Secondary | ICD-10-CM

## 2022-07-04 ENCOUNTER — Ambulatory Visit
Admission: RE | Admit: 2022-07-04 | Discharge: 2022-07-04 | Disposition: A | Discharge status: Home / Self Care | Payer: Self-pay | Source: Ambulatory Visit | Attending: Neurosurgery | Admitting: Neurosurgery

## 2022-07-04 ENCOUNTER — Other Ambulatory Visit: Payer: Self-pay

## 2022-07-04 DIAGNOSIS — Z049 Encounter for examination and observation for unspecified reason: Secondary | ICD-10-CM

## 2022-07-08 ENCOUNTER — Ambulatory Visit
Admission: RE | Admit: 2022-07-08 | Discharge: 2022-07-08 | Disposition: A | Discharge status: Home / Self Care | Payer: Self-pay | Source: Ambulatory Visit | Attending: Neurosurgery | Admitting: Neurosurgery

## 2022-07-08 DIAGNOSIS — Z049 Encounter for examination and observation for unspecified reason: Secondary | ICD-10-CM

## 2022-07-11 ENCOUNTER — Ambulatory Visit: Payer: BC Managed Care – PPO | Admitting: Neurosurgery

## 2022-07-15 NOTE — Progress Notes (Unsigned)
Referring Physician:  Meade Maw, [address] [place],  [place] [address]  Primary Physician:  Leonel Ramsay, MD  History of Present Illness: 07/15/2022 [name] is here today with a chief complaint of ***bilateral low back pain with radiation to the bilateral hips  Duration: ***5+ years? Location: *** Quality: ***sharp, throbbing, stiff Severity: *** 10/10 Precipitating: aggravated by ***standing Modifying factors: made better by ***rest, stretching  Weakness: none Timing: ***constant Bowel/Bladder Dysfunction: none  Conservative measures:  Physical therapy: *** has participated in aquatic therapy at [facility]? Multimodal medical therapy including regular antiinflammatories: *** diclofenac, flexeril, methocarbamol, gabapentin, tramadol, prednisone Injections: *** has received epidural steroid injections 04/09/2022: Bilateral S1 transforaminal ESI (2-3 days of relief then return of pain, dexamethasone 12 mg) 02/26/2022: Bilateral S1 transforaminal ESI (3 days of relief of pain then return of pain)  Past Surgery: *** 01/07/22:C5-7 Posterior Cervical Laminectomy 01/20/22: Cervical wound debridement   Eileen Chaney has ***no symptoms of cervical myelopathy.  The symptoms are causing a significant impact on the patient's life.   Review of Systems:  A 10 point review of systems is negative, except for the pertinent positives and negatives detailed in the HPI.  Past Medical History: Past Medical History:  Diagnosis Date   Anxiety    Arthritis    Asthma    Cervical myelopathy with cervical radiculopathy (HCC)    Complication of anesthesia    DDD (degenerative disc disease), cervical    Depression    Diabetes mellitus without complication (HCC)    type 2   Family history of adverse reaction to anesthesia    mother hard to wake up   Hyperlipidemia    Hypertension    Idiopathic urticaria    Pneumonia    PONV  (postoperative nausea and vomiting)     Past Surgical History: Past Surgical History:  Procedure Laterality Date   ABDOMINAL HYSTERECTOMY  2018   uterine fibroids   ANKLE SURGERY Right 2022   ligament repair   ANTERIOR CERVICAL DECOMP/DISCECTOMY FUSION N/A 11/12/2021   Procedure: C3-5 ANTERIOR CERVICAL DISCECTOMY AND FUSION (GLOBUS HEDRON);  Surgeon: Meade Maw, MD;  Location: [facility] ORS;  Service: Neurosurgery;  Laterality: N/A;   CERVICAL WOUND DEBRIDEMENT N/A 01/20/2022   Procedure: posterior wound debridment;  Surgeon: Meade Maw, MD;  Location: [facility] ORS;  Service: Neurosurgery;  Laterality: N/A;   CESAREAN SECTION     X3 2000, 2001, 2009   POSTERIOR CERVICAL LAMINECTOMY N/A 01/07/2022   Procedure: OPEN C5-7 POSTERIOR DECOMPRESSION;  Surgeon: Meade Maw, MD;  Location: [facility] ORS;  Service: Neurosurgery;  Laterality: N/A;   TONSILLECTOMY  2000    Allergies: Allergies as of 07/16/2022 - Review Complete 06/19/2022  Allergen Reaction Noted   Acetaminophen Itching 11/24/2013   Other Hives 07/03/2020   Dilaudid [hydromorphone hcl] Itching 01/20/2022    Medications: No outpatient medications have been marked as taking for the 07/16/22 encounter (Appointment) with Meade Maw, MD.    Social History: Social History   Tobacco Use   Smoking status: Never   Smokeless tobacco: Never  Vaping Use   Vaping Use: Never used  Substance Use Topics   Alcohol use: Yes    Alcohol/week: 0.0 - 1.0 standard drinks of alcohol    Comment: rarely   Drug use: Not Currently    Family Medical History: No family history on file.  Physical Examination: There were no vitals filed for this visit.  General: Patient is well developed, well nourished,  calm, collected, and in no apparent distress. Attention to examination is appropriate.  Neck:   Supple.  Full range of motion.  Respiratory: Patient is breathing without any difficulty.   NEUROLOGICAL:     Awake,  alert, oriented to person, place, and time.  Speech is clear and fluent. Fund of knowledge is appropriate.   Cranial Nerves: Pupils equal round and reactive to light.  Facial tone is symmetric.  Facial sensation is symmetric. Shoulder shrug is symmetric. Tongue protrusion is midline.  There is no pronator drift.  ROM of spine: full.    Strength: Side Biceps Triceps Deltoid Interossei Grip Wrist Ext. Wrist Flex.  R '5 5 5 5 5 5 5  '$ L '5 5 5 5 5 5 5   '$ Side Iliopsoas Quads Hamstring PF DF EHL  R '5 5 5 5 5 5  '$ L '5 5 5 5 5 5   '$ Reflexes are ***2+ and symmetric at the biceps, triceps, brachioradialis, patella and achilles.   Hoffman's is absent.   Bilateral upper and lower extremity sensation is intact to light touch.    No evidence of dysmetria noted.  Gait is normal.     Medical Decision Making  Imaging: ***  I have personally reviewed the images and agree with the above interpretation.  Assessment and Plan: Eileen Chaney is a pleasant 46 y.o. female with ***   I spent a total of *** minutes in face-to-face and non-face-to-face activities related to this patient's care today.  Thank you for involving me in the care of this patient.      Guida Asman K. Izora Ribas MD, [facility] Neurosurgery

## 2022-07-16 ENCOUNTER — Encounter: Payer: Self-pay | Admitting: Neurosurgery

## 2022-07-16 ENCOUNTER — Ambulatory Visit
Admission: RE | Admit: 2022-07-16 | Discharge: 2022-07-16 | Disposition: A | Discharge status: Home / Self Care | Payer: BC Managed Care – PPO | Attending: Neurosurgery | Admitting: Neurosurgery

## 2022-07-16 ENCOUNTER — Ambulatory Visit (INDEPENDENT_AMBULATORY_CARE_PROVIDER_SITE_OTHER): Payer: BC Managed Care – PPO | Admitting: Neurosurgery

## 2022-07-16 ENCOUNTER — Other Ambulatory Visit: Payer: Self-pay

## 2022-07-16 ENCOUNTER — Ambulatory Visit
Admission: RE | Admit: 2022-07-16 | Discharge: 2022-07-16 | Disposition: A | Discharge status: Home / Self Care | Payer: BC Managed Care – PPO | Source: Ambulatory Visit | Attending: Neurosurgery | Admitting: Neurosurgery

## 2022-07-16 VITALS — BP 130/84 | HR 94 | Ht 68.5 in | Wt 252.0 lb

## 2022-07-16 DIAGNOSIS — M5441 Lumbago with sciatica, right side: Secondary | ICD-10-CM

## 2022-07-16 DIAGNOSIS — Z981 Arthrodesis status: Secondary | ICD-10-CM

## 2022-07-16 DIAGNOSIS — M47819 Spondylosis without myelopathy or radiculopathy, site unspecified: Secondary | ICD-10-CM | POA: Diagnosis not present

## 2022-07-16 DIAGNOSIS — G8929 Other chronic pain: Secondary | ICD-10-CM

## 2022-07-17 ENCOUNTER — Encounter: Payer: Self-pay | Admitting: Neurosurgery

## 2022-07-28 ENCOUNTER — Ambulatory Visit
Admission: RE | Admit: 2022-07-28 | Discharge: 2022-07-28 | Disposition: A | Discharge status: Home / Self Care | Payer: BC Managed Care – PPO | Source: Ambulatory Visit | Attending: Family Medicine | Admitting: Family Medicine

## 2022-07-28 DIAGNOSIS — M25512 Pain in left shoulder: Secondary | ICD-10-CM

## 2022-09-12 ENCOUNTER — Encounter: Payer: Self-pay | Admitting: Neurosurgery

## 2022-09-19 NOTE — Progress Notes (Deleted)
Referring Physician:  Leonel Ramsay, MD [place],  [place] [address]  Primary Physician:  Leonel Ramsay, MD  DOS: 11-12-21 (ACDF C3-5) 01-07-22 (C5-7 decompression) 01-20-22 (Posterior I and D)  History of Present Illness: 09/19/2022*** [name] has a history of HTN, asthma, DM.   Last seen by Dr. Izora Ribas on 07/16/22 and she was doing well regarding her neck. He recommended she see Dr. Sharlet Salina for lumbar injections.   Referred here by ortho (Poggi) for numbness/tingling in her fingers. She is scheduling left shoulder scope with him.   Duration: *** Location: *** Quality: *** Severity: ***  Precipitating: aggravated by *** Modifying factors: made better by *** Weakness: none Timing: *** Bowel/Bladder Dysfunction: none  Conservative measures:  Physical therapy: ***  Multimodal medical therapy including regular antiinflammatories: ***  Injections: *** epidural steroid injections  Past Surgery: ***  Eileen Chaney has ***no symptoms of cervical myelopathy.  The symptoms are causing a significant impact on the patient's life.   Review of Systems:  A 10 point review of systems is negative, except for the pertinent positives and negatives detailed in the HPI.  Past Medical History: Past Medical History:  Diagnosis Date   Anxiety    Arthritis    Asthma    Cervical myelopathy with cervical radiculopathy (HCC)    Complication of anesthesia    DDD (degenerative disc disease), cervical    Depression    Diabetes mellitus without complication (HCC)    type 2   Family history of adverse reaction to anesthesia    mother hard to wake up   Hyperlipidemia    Hypertension    Idiopathic urticaria    Pneumonia    PONV (postoperative nausea and vomiting)     Past Surgical History: Past Surgical History:  Procedure Laterality Date   ABDOMINAL HYSTERECTOMY  2018   uterine fibroids   ANKLE SURGERY Right 2022   ligament  repair   ANTERIOR CERVICAL DECOMP/DISCECTOMY FUSION N/A 11/12/2021   Procedure: C3-5 ANTERIOR CERVICAL DISCECTOMY AND FUSION (GLOBUS HEDRON);  Surgeon: Meade Maw, MD;  Location: [facility] ORS;  Service: Neurosurgery;  Laterality: N/A;   CERVICAL WOUND DEBRIDEMENT N/A 01/20/2022   Procedure: posterior wound debridment;  Surgeon: Meade Maw, MD;  Location: [facility] OR[facility];  Service: Neurosurgery;  Laterality: N/A;   CESAREAN SECTION     X3 2000, 2001, 2009   POSTERIOR CERVICAL LAMINECTOMY N/A 01/07/2022   Procedure: OPEN C5-7 POSTERIOR DECOMPRESSION;  Surgeon: Meade Maw, MD;  Location: [facility] OR[facility];  Service: Neurosurgery;  Laterality: N/A;   TONSILLECTOMY  2000    Allergies: Allergies as of 10/01/2022 - Review Complete 07/16/2022  Allergen Reaction Noted   Acetaminophen Itching 11/24/2013   Other Hives 07/03/2020   Dilaudid [hydromorphone hcl] Itching 01/20/2022    Medications: Outpatient Encounter Medications as of 10/01/2022  Medication Sig   albuterol (VENTOLIN HFA) 108 (90 Base) MCG/ACT inhaler 1-2 puffs every 4-6 hours as needed for cough or wheezing. Rinse mouth after use   atorvastatin (LIPITOR) 40 MG tablet Take 1 tablet (40 mg total) by mouth daily. (Patient not taking: Reported on 02/12/2022)   cetirizine (ZYRTEC) 10 MG tablet Take 10 mg by mouth daily.   Dulaglutide (TRULICITY) 3 ZJ/6.7HA SOPN Inject 3 mg into the skin every Saturday.   EPINEPHrine 0.3 mg/0.3 mL IJ SOAJ injection Inject 0.3 mg into the muscle as needed for anaphylaxis.   folic acid (FOLVITE) 1 MG tablet Take 1 mg by mouth daily.  gabapentin (NEURONTIN) 300 MG capsule Take 300 mg by mouth 3 (three) times daily.   Multiple Vitamin (MULTI-VITAMIN) tablet Take 1 tablet by mouth daily.   NALTREXONE HCL PO Take 4.5 mg by mouth daily.   sulfaSALAzine (AZULFIDINE) 500 MG tablet Take 2 tablets by mouth 2 (two) times daily with a meal.   tiZANidine (ZANAFLEX) 4 MG tablet Take 2-4 mg by mouth 2 (two) times daily  as needed.   traMADol (ULTRAM) 50 MG tablet Take 50 mg by mouth 2 (two) times daily as needed.   No facility-administered encounter medications on file as of 10/01/2022.    Social History: Social History   Tobacco Use   Smoking status: Never   Smokeless tobacco: Never  Vaping Use   Vaping Use: Never used  Substance Use Topics   Alcohol use: Yes    Alcohol/week: 0.0 - 1.0 standard drinks of alcohol    Comment: rarely   Drug use: Not Currently    Family Medical History: No family history on file.  Physical Examination: There were no vitals filed for this visit.  General: Patient is well developed, well nourished, calm, collected, and in no apparent distress. Attention to examination is appropriate.  Respiratory: Patient is breathing without any difficulty.   NEUROLOGICAL:     Awake, alert, oriented to person, place, and time.  Speech is clear and fluent. Fund of knowledge is appropriate.   Cranial Nerves: Pupils equal round and reactive to light.  Facial tone is symmetric.  Facial sensation is symmetric.  ROM of spine:  *** ROM of cervical spine *** pain *** ROM of lumbar spine *** pain  No abnormal lesions on exposed skin.   Strength: Side Biceps Triceps Deltoid Interossei Grip Wrist Ext. Wrist Flex.  R '5 5 5 5 5 5 5  '$ L '5 5 5 5 5 5 5   '$ Side Iliopsoas Quads Hamstring PF DF EHL  R '5 5 5 5 5 5  '$ L '5 5 5 5 5 5   '$ Reflexes are ***2+ and symmetric at the biceps, triceps, brachioradialis, patella and achilles.   Hoffman's is absent.  Clonus is not present.   Bilateral upper and lower extremity sensation is intact to light touch.    No evidence of dysmetria noted.  Gait is normal.   ***No difficulty with tandem gait.    Medical Decision Making  Imaging: Cervical xrays 07/17/22:  FINDINGS: Anterior and interbody fusion C3 through C5. Hardware intact. Anatomic alignment. Diffuse degenerative change. Ligamentous ossification. No acute bony or joint abnormality  identified. Pulmonary apices are clear.   IMPRESSION: Anterior and interbody fusion C3 through C5. Hardware intact. Anatomic alignment. Diffuse degenerative change. No acute bony or joint abnormality identified.     Electronically Signed   By: Marcello Moores  Register M.D.   On: 07/17/2022 07:27  I have personally reviewed the images and agree with the above interpretation.  Assessment and Plan: [name] is a pleasant 46 y.o. female with ***  Treatment options discussed with patient and following plan made:   - Order for physical therapy for *** spine ***. - Continue on current medications including ***. Reviewed proper dosing along with risks and benefits. Take and NSAIDs with food.      I spent a total of *** minutes in face-to-face and non-face-to-face activities related to this patient's care today including review of outside records, review of imaging, review of symptoms, physical exam, discussion of differential diagnosis, discussion of treatment options, and documentation.  Thank you for involving me in the care of this patient.   Geronimo Boot PA-C Dept. of Neurosurgery

## 2022-10-01 ENCOUNTER — Ambulatory Visit: Payer: BC Managed Care – PPO | Admitting: Orthopedic Surgery

## 2022-10-02 NOTE — Progress Notes (Signed)
Referring Physician:  Leonel Ramsay, MD [place],  [place] [address]  Primary Physician:  Leonel Ramsay, MD  DOS: 11-12-21 (ACDF C3-5) 01-07-22 (C5-7 decompression) 01-20-22 (Posterior I and D)  History of Present Illness: 10/04/2022 [name] has a history of HTN, asthma, DM, severe OSA, chronic urticaria, FM, and inflammatory arthritis.   Last seen by Dr. Izora Ribas on 07/16/22 and she was doing well regarding her neck. He recommended she see Dr. Sharlet Salina for lumbar injections.   Referred here by ortho (Poggi) for numbness/tingling in her fingers. She is scheduling left shoulder scope with him.   She has more constant neck pain that radiates to left > right shoulders x 1-2 months. She has intermittent numbness in fingers bilaterally- this is worse at night, when typing, and when driving. No radicular arm pain. She also notes stiffness in her neck.   She is in PT for her shoulders ([facility])- had left shoulder injection with improvement in shoulder pain for a week. She is also in aquatic PT for her lumbar spine.   She is taking prn neurontin and prn zanaflex. She has ultram but rarely takes it.   Conservative measures:  Physical therapy: not for neck  Multimodal medical therapy including regular antiinflammatories: aleve, neurontin, zanaflex, ultram  Injections: No recent epidural steroid injections  Past Surgery:  11-12-21 (ACDF C3-5) 01-07-22 (C5-7 decompression) 01-20-22 (Posterior I and D)   Eileen Chaney has no symptoms of cervical myelopathy.  The symptoms are causing a significant impact on the patient's life.   Review of Systems:  A 10 point review of systems is negative, except for the pertinent positives and negatives detailed in the HPI.  Past Medical History: Past Medical History:  Diagnosis Date   Anxiety    Arthritis    Asthma    Cervical myelopathy with cervical radiculopathy (HCC)    Complication of  anesthesia    DDD (degenerative disc disease), cervical    Depression    Diabetes mellitus without complication (HCC)    type 2   Family history of adverse reaction to anesthesia    mother hard to wake up   Hyperlipidemia    Hypertension    Idiopathic urticaria    Pneumonia    PONV (postoperative nausea and vomiting)     Past Surgical History: Past Surgical History:  Procedure Laterality Date   ABDOMINAL HYSTERECTOMY  2018   uterine fibroids   ANKLE SURGERY Right 2022   ligament repair   ANTERIOR CERVICAL DECOMP/DISCECTOMY FUSION N/A 11/12/2021   Procedure: C3-5 ANTERIOR CERVICAL DISCECTOMY AND FUSION (GLOBUS HEDRON);  Surgeon: Meade Maw, MD;  Location: [facility] ORS;  Service: Neurosurgery;  Laterality: N/A;   CERVICAL WOUND DEBRIDEMENT N/A 01/20/2022   Procedure: posterior wound debridment;  Surgeon: Meade Maw, MD;  Location: [facility] OR[facility];  Service: Neurosurgery;  Laterality: N/A;   CESAREAN SECTION     X3 2000, 2001, 2009   POSTERIOR CERVICAL LAMINECTOMY N/A 01/07/2022   Procedure: OPEN C5-7 POSTERIOR DECOMPRESSION;  Surgeon: Meade Maw, MD;  Location: [facility] ORS;  Service: Neurosurgery;  Laterality: N/A;   TONSILLECTOMY  2000    Allergies: Allergies as of 10/04/2022 - Review Complete 10/04/2022  Allergen Reaction Noted   Acetaminophen Itching and Other (See Comments) 11/24/2013   Other Hives 07/03/2020   Dilaudid [hydromorphone hcl] Itching 01/20/2022   Lactose Other (See Comments) 10/04/2022    Medications: Outpatient Encounter Medications as of 10/04/2022  Medication Sig  ADVAIR DISKUS 100-50 MCG/ACT AEPB Inhale 1 puff into the lungs as needed.   FLUoxetine (PROZAC) 20 MG capsule Take 20 mg by mouth daily.   albuterol (VENTOLIN HFA) 108 (90 Base) MCG/ACT inhaler 1-2 puffs every 4-6 hours as needed for cough or wheezing. Rinse mouth after use   atorvastatin (LIPITOR) 40 MG tablet Take 1 tablet (40 mg total) by mouth daily.   cetirizine (ZYRTEC) 10 MG  tablet Take 10 mg by mouth daily.   EPINEPHrine 0.3 mg/0.3 mL IJ SOAJ injection Inject 0.3 mg into the muscle as needed for anaphylaxis.   folic acid (FOLVITE) 1 MG tablet Take 1 mg by mouth daily.   gabapentin (NEURONTIN) 300 MG capsule Take 300 mg by mouth 3 (three) times daily.   Multiple Vitamin (MULTI-VITAMIN) tablet Take 1 tablet by mouth daily.   NALTREXONE HCL PO Take 4.5 mg by mouth daily.   [START ON 10/10/2022] tirzepatide (MOUNJARO) 5 MG/0.5ML Pen Inject 5 mg into the skin once a week.   tiZANidine (ZANAFLEX) 4 MG tablet Take 2-4 mg by mouth 2 (two) times daily as needed.   traMADol (ULTRAM) 50 MG tablet Take 50 mg by mouth as needed.   [DISCONTINUED] Dulaglutide (TRULICITY) 3 YI/9.4WN SOPN Inject 3 mg into the skin every Saturday.   [DISCONTINUED] sulfaSALAzine (AZULFIDINE) 500 MG tablet Take 2 tablets by mouth 2 (two) times daily with a meal.   No facility-administered encounter medications on file as of 10/04/2022.    Social History: Social History   Tobacco Use   Smoking status: Never   Smokeless tobacco: Never  Vaping Use   Vaping Use: Never used  Substance Use Topics   Alcohol use: Yes    Alcohol/week: 0.0 - 1.0 standard drinks of alcohol    Comment: rarely   Drug use: Not Currently    Family Medical History: No family history on file.  Physical Examination: Vitals:   10/04/22 1154  BP: 131/78  Pulse: 82    General: Patient is well developed, well nourished, calm, collected, and in no apparent distress. Attention to examination is appropriate.  Respiratory: Patient is breathing without any difficulty.   NEUROLOGICAL:     Awake, alert, oriented to person, place, and time.  Speech is clear and fluent. Fund of knowledge is appropriate.   Cranial Nerves: Pupils equal round and reactive to light.  Facial tone is symmetric.  Facial sensation is symmetric.  Cervical incisions well healed. Mild tenderness over posterior incision.   No abnormal lesions on  exposed skin.   Strength: Side Biceps Triceps Deltoid Interossei Grip Wrist Ext. Wrist Flex.  R '5 5 5 5 5 5 5  '$ L '5 5 5 5 5 5 5   '$ Side Iliopsoas Quads Hamstring PF DF EHL  R '5 5 5 5 5 5  '$ L '5 5 5 5 5 5   '$ Reflexes are 2+ and symmetric at the biceps, triceps, brachioradialis, patella and achilles.   Hoffman's is absent.  Clonus is not present.   Bilateral upper and lower extremity sensation is intact to light touch.     She has positive tinels at wrist bilaterally.  She has positive phalens at wrist bilaterally.   Gait is normal.     Medical Decision Making  Imaging: Cervical xrays 07/17/22:  FINDINGS: Anterior and interbody fusion C3 through C5. Hardware intact. Anatomic alignment. Diffuse degenerative change. Ligamentous ossification. No acute bony or joint abnormality identified. Pulmonary apices are clear.   IMPRESSION: Anterior and interbody fusion C3  through C5. Hardware intact. Anatomic alignment. Diffuse degenerative change. No acute bony or joint abnormality identified.     Electronically Signed   By: Marcello Moores  Register M.D.   On: 07/17/2022 07:27  I have personally reviewed the images and agree with the above interpretation.  Assessment and Plan: Ms. Larivee is a pleasant 47 y.o. female with history of ACDF C3-C5 on 11/12/21, C5-C7 decompression 01/07/22, and posterior I&D on 01/20/22.   Now with 1-2 month history of constant neck pain that radiates to left > right shoulders along with intermittent numbness in fingers bilaterally- this is worse at night, when typing, and when driving. No radicular arm pain.   Cervical xrays from October look good, but current pain started after this.   Her exam is suspicious for bilateral carpal tunnel. She has history of bilateral carpal tunnel release years ago.   Treatment options discussed with patient and following plan made:   - Updated cervical xrays. Will review with Dr. Izora Ribas and message her with results.  - EMG/NCS  of bilateral upper extremities to evaluate for recurrent carpal tunnel syndrome. Orders to neurology at [facility]. She will let us know when this is scheduled.  - Depending on xray results, will likely consider PT for cervical spine (would need to fax orders to [facility], phone [phone]).   I spent a total of 30 minutes in face-to-face and non-face-to-face activities related to this patient's care today including review of outside records, review of imaging, review of symptoms, physical exam, discussion of differential diagnosis, discussion of treatment options, and documentation.   Geronimo Boot PA-C Dept. of Neurosurgery

## 2022-10-04 ENCOUNTER — Telehealth: Payer: Self-pay | Admitting: Orthopedic Surgery

## 2022-10-04 ENCOUNTER — Ambulatory Visit
Admission: RE | Admit: 2022-10-04 | Discharge: 2022-10-04 | Disposition: A | Discharge status: Home / Self Care | Payer: BC Managed Care – PPO | Attending: Orthopedic Surgery | Admitting: Orthopedic Surgery

## 2022-10-04 ENCOUNTER — Ambulatory Visit (INDEPENDENT_AMBULATORY_CARE_PROVIDER_SITE_OTHER): Payer: BC Managed Care – PPO | Admitting: Orthopedic Surgery

## 2022-10-04 ENCOUNTER — Encounter: Payer: Self-pay | Admitting: Orthopedic Surgery

## 2022-10-04 ENCOUNTER — Ambulatory Visit
Admission: RE | Admit: 2022-10-04 | Discharge: 2022-10-04 | Disposition: A | Discharge status: Home / Self Care | Payer: BC Managed Care – PPO | Source: Ambulatory Visit | Attending: Orthopedic Surgery | Admitting: Orthopedic Surgery

## 2022-10-04 VITALS — BP 131/78 | HR 82 | Ht 68.0 in | Wt 259.6 lb

## 2022-10-04 DIAGNOSIS — R202 Paresthesia of skin: Secondary | ICD-10-CM

## 2022-10-04 DIAGNOSIS — M47812 Spondylosis without myelopathy or radiculopathy, cervical region: Secondary | ICD-10-CM | POA: Insufficient documentation

## 2022-10-04 DIAGNOSIS — Z981 Arthrodesis status: Secondary | ICD-10-CM | POA: Insufficient documentation

## 2022-10-04 DIAGNOSIS — R200 Anesthesia of skin: Secondary | ICD-10-CM | POA: Diagnosis not present

## 2022-10-04 NOTE — Telephone Encounter (Signed)
I put in orders for bilateral UE EMG/NCS for her to be done at [facility] in [place]. Please fax to them.

## 2022-10-04 NOTE — Telephone Encounter (Signed)
Referral faxed to [facility]

## 2022-10-08 DIAGNOSIS — Z981 Arthrodesis status: Secondary | ICD-10-CM

## 2022-10-08 DIAGNOSIS — R200 Anesthesia of skin: Secondary | ICD-10-CM

## 2022-10-08 DIAGNOSIS — M47812 Spondylosis without myelopathy or radiculopathy, cervical region: Secondary | ICD-10-CM

## 2022-10-23 NOTE — Telephone Encounter (Signed)
11/25/2022 

## 2022-10-31 ENCOUNTER — Other Ambulatory Visit: Payer: Self-pay | Admitting: Surgery

## 2022-11-06 ENCOUNTER — Inpatient Hospital Stay
Admission: RE | Admit: 2022-11-06 | Discharge: 2022-11-06 | Disposition: A | Discharge status: Home / Self Care | Payer: BC Managed Care – PPO | Source: Ambulatory Visit

## 2022-11-06 NOTE — Progress Notes (Signed)
Called patient to complete pre-anesthesia interview but per patient she called and get the initial surgery date re-scheduled far out and  may possibly cancel surgery due to positive outcome with physical therapy.  Pre admit interview was not completed at this time.

## 2022-11-14 ENCOUNTER — Ambulatory Visit: Admit: 2022-11-14 | Payer: BC Managed Care – PPO | Admitting: Surgery

## 2022-11-14 SURGERY — SHOULDER ARTHROSCOPY WITH SUBACROMIAL DECOMPRESSION, ROTATOR CUFF REPAIR AND BICEP TENDON REPAIR
Anesthesia: Choice | Site: Shoulder | Laterality: Left

## 2022-12-04 NOTE — Telephone Encounter (Signed)
Results in care everywhere.

## 2022-12-05 ENCOUNTER — Encounter: Payer: Self-pay | Admitting: Orthopedic Surgery

## 2022-12-05 NOTE — Telephone Encounter (Signed)
EMG results moved with Dr. Izora Ribas. MyChart message sent to patient.

## 2023-01-08 ENCOUNTER — Encounter: Payer: Self-pay | Admitting: Neurosurgery

## 2023-01-10 ENCOUNTER — Telehealth: Payer: Self-pay

## 2023-01-10 DIAGNOSIS — M5416 Radiculopathy, lumbar region: Secondary | ICD-10-CM

## 2023-01-10 NOTE — Telephone Encounter (Signed)
Refer to mychart message from 01/08/23  Order placed for MRI lumbar spine

## 2023-01-10 NOTE — Telephone Encounter (Signed)
-----   Message from Venetia Night, MD sent at 01/10/2023  7:13 AM EDT ----- I just recommended an L spine MRI given changei n symptoms.  I am not sure which PA ordered this last, but ok to order through me or them

## 2023-01-22 ENCOUNTER — Other Ambulatory Visit: Payer: Self-pay | Admitting: Pain Medicine

## 2023-01-22 DIAGNOSIS — M546 Pain in thoracic spine: Secondary | ICD-10-CM

## 2023-01-24 ENCOUNTER — Ambulatory Visit: Admission: RE | Admit: 2023-01-24 | Payer: BC Managed Care – PPO | Source: Ambulatory Visit

## 2023-02-04 ENCOUNTER — Ambulatory Visit
Admission: RE | Admit: 2023-02-04 | Discharge: 2023-02-04 | Disposition: A | Discharge status: Home / Self Care | Payer: BC Managed Care – PPO | Source: Ambulatory Visit | Attending: Neurosurgery | Admitting: Neurosurgery

## 2023-02-04 ENCOUNTER — Ambulatory Visit: Payer: BC Managed Care – PPO

## 2023-02-04 DIAGNOSIS — M546 Pain in thoracic spine: Secondary | ICD-10-CM | POA: Diagnosis present

## 2023-02-04 DIAGNOSIS — M5416 Radiculopathy, lumbar region: Secondary | ICD-10-CM | POA: Insufficient documentation

## 2023-02-10 ENCOUNTER — Encounter: Payer: Self-pay | Admitting: Neurosurgery

## 2023-02-27 ENCOUNTER — Ambulatory Visit (INDEPENDENT_AMBULATORY_CARE_PROVIDER_SITE_OTHER): Payer: BC Managed Care – PPO | Admitting: Neurosurgery

## 2023-02-27 VITALS — BP 134/84 | Ht 68.0 in | Wt 259.0 lb

## 2023-02-27 DIAGNOSIS — G8929 Other chronic pain: Secondary | ICD-10-CM

## 2023-02-27 DIAGNOSIS — M5416 Radiculopathy, lumbar region: Secondary | ICD-10-CM

## 2023-02-27 DIAGNOSIS — M5441 Lumbago with sciatica, right side: Secondary | ICD-10-CM

## 2023-02-27 NOTE — Progress Notes (Signed)
Referring Physician:  No referring provider defined for this encounter.  Primary Physician:  Mick Sell, MD  DOS: 11-12-21 (ACDF C3-5) 01-07-22 (C5-7 decompression) 01-20-22 (Posterior I and D)   History of Present Illness: 02/27/2023 Eileen Chaney returns to see me.  She has had ongoing back pain for quite some time.  Her back pain is worse than her leg pain.  She gets worsened pain with standing and walking or with certain activities.  When she stands for long period of time, she may have pain in her right buttock and also some numbness that affects the middle 3 toes.  This goes away when she sits down.  She has recently been evaluated in a pain clinic for possible spinal cord stimulator evaluation.  07/16/2022 Eileen Chaney is here today with a chief complaint of back pain.  She does have some leg pain particular with difficulty in her right buttock and posterior thigh with some cramping in her right calf.  However, her back pain is her most pressing issue.  She reports some posterior neck pain and popping over the past 6 weeks with intermittent numbness down both arms to her fingers.  That being said, her arm and neck symptoms are much improved compared to prior to surgery.  She has been having back pain for many years, but it has been worsening over the past year.  Standing and walking make it worse.  Stretching, rest, and certain positions improve her pain.  She has been doing physical therapy and just started aquatic therapy.  Her pain is worst in the mornings.   Bilateral low back pain with radiation to the bilateral posterior hips. Right hip pain, right buttock pain and posterior thigh pain, cramping in right calf. Her back is more bothersome than her leg.   Bowel/Bladder Dysfunction: none  Conservative measures:  Physical therapy: participated at [facility] for about 2 months (still attending); currently participating in aquatic therapy at [facility]  (started 07/15/22) for back; Was doing PT at [facility] for shoulder and some for neck - stopped about a month ago. Multimodal medical therapy including regular antiinflammatories:  diclofenac, flexeril, methocarbamol, gabapentin, tramadol, prednisone Injections:  has received epidural steroid injections 04/09/2022: Bilateral S1 transforaminal ESI (2-3 days of relief then return of pain, dexamethasone 12 mg) 02/26/2022: Bilateral S1 transforaminal ESI (3 days of relief of pain then return of pain) 10/26/2021: Bilateral SI joint injections (1 week relief then return of pain)   Eileen Chaney has no symptoms of cervical myelopathy.  The symptoms are causing a significant impact on the patient's life.   Review of Systems:  A 10 point review of systems is negative, except for the pertinent positives and negatives detailed in the HPI.  Past Medical History: Past Medical History:  Diagnosis Date   Anxiety    Arthritis    Asthma    Cervical myelopathy with cervical radiculopathy (HCC)    Complication of anesthesia    DDD (degenerative disc disease), cervical    Depression    Diabetes mellitus without complication (HCC)    type 2   Family history of adverse reaction to anesthesia    mother hard to wake up   Fibromyalgia    Hyperlipidemia    Hypertension    Idiopathic urticaria    Pneumonia    PONV (postoperative nausea and vomiting)    Undifferentiated inflammatory arthritis (HCC)     Past Surgical History: Past Surgical History:  Procedure Laterality Date   ABDOMINAL  HYSTERECTOMY  2018   uterine fibroids   ANKLE SURGERY Right 2022   ligament repair   ANTERIOR CERVICAL DECOMP/DISCECTOMY FUSION N/A 11/12/2021   Procedure: C3-5 ANTERIOR CERVICAL DISCECTOMY AND FUSION (GLOBUS HEDRON);  Surgeon: Eileen Night, MD;  Location: [facility] ORS;  Service: Neurosurgery;  Laterality: N/A;   CERVICAL WOUND DEBRIDEMENT N/A 01/20/2022   Procedure: posterior wound debridment;  Surgeon: Eileen Night, MD;  Location: [facility] ORS;  Service: Neurosurgery;  Laterality: N/A;   CESAREAN SECTION     X3 2000, 2001, 2009   POSTERIOR CERVICAL LAMINECTOMY N/A 01/07/2022   Procedure: OPEN C5-7 POSTERIOR DECOMPRESSION;  Surgeon: Eileen Night, MD;  Location: [facility] ORS;  Service: Neurosurgery;  Laterality: N/A;   TONSILLECTOMY  2000    Allergies: Allergies as of 02/27/2023 - Review Complete 02/27/2023  Allergen Reaction Noted   Acetaminophen Itching and Other (See Comments) 11/24/2013   Other Hives 07/03/2020   Dilaudid [hydromorphone hcl] Itching 01/20/2022   Lactose Other (See Comments) 10/04/2022    Medications: Current Meds  Medication Sig   ADVAIR DISKUS 100-50 MCG/ACT AEPB Inhale 1 puff into the lungs as needed.   albuterol (VENTOLIN HFA) 108 (90 Base) MCG/ACT inhaler 1-2 puffs every 4-6 hours as needed for cough or wheezing. Rinse mouth after use   atorvastatin (LIPITOR) 40 MG tablet Take 1 tablet (40 mg total) by mouth daily.   EPINEPHrine 0.3 mg/0.3 mL IJ SOAJ injection Inject 0.3 mg into the muscle as needed for anaphylaxis.   FLUoxetine (PROZAC) 20 MG capsule Take 20 mg by mouth daily.   Multiple Vitamin (MULTI-VITAMIN) tablet Take 1 tablet by mouth daily.   NALTREXONE HCL PO Take 4.5 mg by mouth daily.   orphenadrine (NORFLEX) 100 MG tablet Take 100 mg by mouth daily as needed for muscle spasms.   pregabalin (LYRICA) 25 MG capsule Take by mouth.   spironolactone (ALDACTONE) 100 MG tablet Take 200 mg by mouth daily.   traMADol (ULTRAM) 50 MG tablet Take 50 mg by mouth as needed.    Social History: Social History   Tobacco Use   Smoking status: Never   Smokeless tobacco: Never  Vaping Use   Vaping Use: Never used  Substance Use Topics   Alcohol use: Yes    Alcohol/week: 0.0 - 1.0 standard drinks of alcohol    Comment: rarely   Drug use: Not Currently    Family Medical History: No family history on file.  Physical Examination: Vitals:   02/27/23 1340  BP:  134/84    General: Patient is well developed, well nourished, calm, collected, and in no apparent distress. Attention to examination is appropriate.  Neck:   Supple.  Full range of motion.  Respiratory: Patient is breathing without any difficulty.   NEUROLOGICAL:     Awake, alert, oriented to person, place, and time.  Speech is clear and fluent. Fund of knowledge is appropriate.   Cranial Nerves: Appear intact  Strength: Side Biceps Triceps Deltoid Interossei Grip Wrist Ext. Wrist Flex.  R 5 5 5 5 5 5 5   L 5 5 5 5 5 5 5    Side Iliopsoas Quads Hamstring PF DF EHL  R 5 5 5 5 5 5   L 5 5 5 5 5 5     Hoffman's is absent.   Bilateral upper and lower extremity sensation is intact to light touch.    No evidence of dysmetria noted.  Gait is normal.    She points to her lower lumbar spine as the area  of her pain.   Medical Decision Making  Imaging: Cervical spine x-rays from July 16, 2022 show no complications.  CT A/P 03/09/2022 IMPRESSION: 1. No acute process identified. 2. Hepatomegaly.     Electronically Signed   By: Jannifer Hick M.D.   On: 03/09/2022 18:59  Note that I reviewed these films and there is evidence of substantial L4-5 facet arthrosis at this level.  MRI L spine 12/31/21 IMPRESSION: 1. Central disc protrusion at L5-S1 may contact the descending S1 nerve roots. 2. Mild facet arthropathy at L4-L5 and L5-S1, which can cause pain. 3. No spinal canal stenosis or neural foraminal narrowing.     Electronically Signed   By: Wiliam Ke M.D.   On: 12/31/2021 19:34  MRI TL spine 02/09/2023 IMPRESSION: 1. T3-T4 mild spinal canal stenosis and mild left neural foraminal narrowing. 2. L5-S1 disc protrusion has decreased in size but appears to contact the descending left S1 nerve roots. Narrowing of the lateral recesses at this level could also affect the descending S1 nerve roots. 3. L4-L5 mild-to-moderate facet arthropathy. Narrowing of  the lateral recesses at this level could affect the descending L5 nerve roots.     Electronically Signed   By: Wiliam Ke M.D.   On: 02/09/2023 18:16    I have personally reviewed the images and agree with the above interpretation.  Assessment and Plan: Eileen Chaney is a pleasant 47 y.o. female with chronic low back pain that has been worsening over time.  She also has possible radicular component in the right leg.  This appears most consistent with right L5 distribution.  She is doing well from her neck surgery.    We reviewed her MRI studies.  She has significant facet arthrosis at L4-5 and I feel that she has some anterior translation of the L4 inferior articulating processes bilaterally with respect to the L5 superior articulating processes.  That being said, she does not have a significant anterolisthesis of L4 and L5.  She also has significant degenerative disc disease at L5-S1.  Both of these could be contributing to back pain.  I have some suspicion that she is developing a spondylolisthesis at L4-5.  That being said, she does not have clear indication of that at this time.  Thus, she is unlikely to meet the criteria for any surgical intervention at this point that might address both her back and leg pain.  As such, I have recommended against consideration of spinal surgery at this time.  I continue to think that spinal cord stimulator evaluation is the most appropriate step forward from here.  We spent some time discussing the pluses and minuses of percutaneous spinal cord stimulator leads versus an open paddle lead.  I will try to provide her with some information regarding the pluses and minuses from peer-reviewed publication.  I spent a total of 15 minutes in face-to-face and non-face-to-face activities related to this patient's care today.  Thank you for involving me in the care of this patient.      Sharlisa Hollifield K. Myer Haff MD, [facility] Neurosurgery

## 2023-03-10 ENCOUNTER — Other Ambulatory Visit: Payer: Self-pay

## 2023-03-10 ENCOUNTER — Ambulatory Visit (INDEPENDENT_AMBULATORY_CARE_PROVIDER_SITE_OTHER): Payer: Self-pay | Admitting: Oncology

## 2023-03-10 ENCOUNTER — Encounter: Payer: Self-pay | Admitting: Oncology

## 2023-03-10 VITALS — BP 123/83 | HR 87 | Temp 98.1°F

## 2023-03-10 DIAGNOSIS — M791 Myalgia, unspecified site: Secondary | ICD-10-CM

## 2023-03-10 DIAGNOSIS — R21 Rash and other nonspecific skin eruption: Secondary | ICD-10-CM

## 2023-03-10 MED ORDER — CLOTRIMAZOLE-BETAMETHASONE 1-0.05 % EX CREA
1.0000 | TOPICAL_CREAM | Freq: Two times a day (BID) | CUTANEOUS | 0 refills | Status: DC
Start: 2023-03-10 — End: 2023-09-04

## 2023-03-10 NOTE — Progress Notes (Signed)
[facility] [address] [place], [place] [address] Phone: [phone] Fax: [phone]   Office Visit Note  Patient Name: Eileen Chaney  Date of [id number]  Med Rec number [id number]  Date of Service: 03/10/2023  Acetaminophen, Other, Dilaudid [hydromorphone hcl], and Lactose  No chief complaint on file.  Patient is an 47 y.o. female with past medical history significant for asthma, hypertension, type 2 diabetes, degenerative disc disease, hyperlipidemia, generalized anxiety disorder, morbid obesity and spinal stenosis who presents today complaints of skin irritation inside of her bellybutton that she noticed a few days ago.  Said she was getting out of the shower when all of a sudden her bellybutton started to bleed.  Denies any trauma to her abdomen.  Reports that she has been cleaning the inside of her bellybutton with soap and water and applying Betadine and Neosporin without significant improvement.  Reports some discomfort behind her bellybutton.  Has noticed more frequent and looser bowel movements than usual.  Denies constipation.  Denies nausea vomiting or diarrhea.  Reports her sugars are under control.  Reports developing some fatigue, dizziness, chills and feeling feverish over the past 24 to 48 hours.  Mild ear discomfort left worse than right.  She did recently fly back from a business trip on Saturday.  Reports she wore a mask.  Has not taken anything over-the-counter for her symptoms.  Has history of fibromyalgia and took a tramadol this morning because the body aches were so bad.  Leaving to go to [place] and [place] Tuesday of next week.   Current Medication:  Outpatient Encounter Medications as of 03/10/2023  Medication Sig   ADVAIR DISKUS 100-50 MCG/ACT AEPB Inhale 1 puff into the lungs as needed.   albuterol (VENTOLIN HFA) 108 (90 Base) MCG/ACT inhaler 1-2 puffs every 4-6 hours as needed for cough or wheezing. Rinse mouth after use   atorvastatin (LIPITOR) 40  MG tablet Take 1 tablet (40 mg total) by mouth daily.   EPINEPHrine 0.3 mg/0.3 mL IJ SOAJ injection Inject 0.3 mg into the muscle as needed for anaphylaxis.   FLUoxetine (PROZAC) 20 MG capsule Take 20 mg by mouth daily.   Multiple Vitamin (MULTI-VITAMIN) tablet Take 1 tablet by mouth daily.   NALTREXONE HCL PO Take 4.5 mg by mouth daily.   orphenadrine (NORFLEX) 100 MG tablet Take 100 mg by mouth daily as needed for muscle spasms.   pregabalin (LYRICA) 25 MG capsule Take by mouth.   spironolactone (ALDACTONE) 100 MG tablet Take 200 mg by mouth daily.   traMADol (ULTRAM) 50 MG tablet Take 50 mg by mouth as needed.   No facility-administered encounter medications on file as of 03/10/2023.     Medical History: Past Medical History:  Diagnosis Date   Anxiety    Arthritis    Asthma    Cervical myelopathy with cervical radiculopathy (HCC)    Complication of anesthesia    DDD (degenerative disc disease), cervical    Depression    Diabetes mellitus without complication (HCC)    type 2   Family history of adverse reaction to anesthesia    mother hard to wake up   Fibromyalgia    Hyperlipidemia    Hypertension    Idiopathic urticaria    Pneumonia    PONV (postoperative nausea and vomiting)    Undifferentiated inflammatory arthritis (HCC)     Vital Signs: There were no vitals taken for this visit.  ROS: As per HPI.  All other pertinent ROS negative.  Review of Systems  Constitutional:  Positive for chills, diaphoresis and fatigue. Negative for appetite change and fever.  HENT:  Positive for ear pain (mild) and postnasal drip. Negative for congestion, sinus pressure, sinus pain and sore throat.   Respiratory:  Negative for cough.   Gastrointestinal:  Positive for diarrhea (loose). Negative for abdominal distention, nausea and vomiting.  Musculoskeletal:  Positive for myalgias.  Skin:  Positive for wound (Belly button).  Neurological:  Positive for dizziness. Negative for  headaches.    Physical Exam Vitals reviewed.  Constitutional:      Appearance: She is well-developed.  HENT:     Right Ear: A middle ear effusion (R>L) is present.     Left Ear: A middle ear effusion is present.     Nose: No congestion or rhinorrhea.     Right Sinus: No maxillary sinus tenderness or frontal sinus tenderness.     Left Sinus: No maxillary sinus tenderness or frontal sinus tenderness.     Mouth/Throat:     Mouth: Mucous membranes are moist.     Pharynx: Oropharynx is clear. No posterior oropharyngeal erythema.     Tonsils: No tonsillar exudate.     Comments: PND Cardiovascular:     Rate and Rhythm: Normal rate and regular rhythm.  Pulmonary:     Effort: Pulmonary effort is normal.     Breath sounds: Normal breath sounds.  Abdominal:     General: Bowel sounds are normal.     Palpations: Abdomen is soft.     Tenderness: There is no abdominal tenderness.  Skin:    General: Skin is warm.     Findings: Rash present.     Comments: Maceration and excoriation to inside of navel. Yellow drainage.    Neurological:     Mental Status: She is alert.    No results found for this or any previous visit (from the past 24 hour(s)).  Assessment/Plan: 1. Myalgia -Flu and COVID-negative. -Discussed drawing labs to rule out viral versus bacterial infection given recent travel. -Discussed symptoms could be due to a fibromyalgia flare. -Will send results via MyChart when available. -Continue over-the-counter medication and tramadol as needed for body aches. -Please let me know if your symptoms worsen or fail to improve.  - POC SOFIA 2 FLU + SARS ANTIGEN FIA - CBC w/Diff  2. Rash and nonspecific skin eruption -Excoriation and maceration to inside of her navel.  -Exam is concerning for a fungal infection versus an acute allergic/irritant dermatitis.  -Recommend keeping clean and dry.  May use a cottonball to dry prior to applying antifungal ointment. -Discussed continuing to  monitor blood sugars as this can contribute to increased fungal infections. -Lotrisone cream called into pharmacy.  Please use once or twice a day.  Please keep uncovered.  Let me know if her symptoms worsen or fail to improve.  - clotrimazole-betamethasone (LOTRISONE) cream; Apply 1 Application topically 2 (two) times daily.  Dispense: 45 g; Refill: 0  General Counseling: Shuntell verbalizes understanding of the findings of todays visit and agrees with plan of treatment. I have discussed any further diagnostic evaluation that may be needed or ordered today. We also reviewed her medications today. she has been encouraged to call the office with any questions or concerns that should arise related to todays visit.   No orders of the defined types were placed in this encounter.   No orders of the defined types were placed in this encounter.   I spent 20 minutes dedicated  to the care of this patient (face-to-face and non-face-to-face) on the date of the encounter to include what is described in the assessment and plan.   Durenda Hurt, NP 03/10/2023 12:53 PM

## 2023-03-11 LAB — CBC WITH DIFFERENTIAL/PLATELET
Basophils Absolute: 0 10*3/uL (ref 0.0–0.2)
Basos: 0 %
EOS (ABSOLUTE): 0.2 10*3/uL (ref 0.0–0.4)
Eos: 2 %
Hematocrit: 42.8 % (ref 34.0–46.6)
Hemoglobin: 13.5 g/dL (ref 11.1–15.9)
Immature Grans (Abs): 0 10*3/uL (ref 0.0–0.1)
Immature Granulocytes: 0 %
Lymphocytes Absolute: 3.4 10*3/uL — ABNORMAL HIGH (ref 0.7–3.1)
Lymphs: 37 %
MCH: 26 pg — ABNORMAL LOW (ref 26.6–33.0)
MCHC: 31.5 g/dL (ref 31.5–35.7)
MCV: 83 fL (ref 79–97)
Monocytes Absolute: 0.6 10*3/uL (ref 0.1–0.9)
Monocytes: 7 %
Neutrophils Absolute: 4.9 10*3/uL (ref 1.4–7.0)
Neutrophils: 54 %
Platelets: 331 10*3/uL (ref 150–450)
RBC: 5.19 x10E6/uL (ref 3.77–5.28)
RDW: 14.6 % (ref 11.7–15.4)
WBC: 9.1 10*3/uL (ref 3.4–10.8)

## 2023-03-14 ENCOUNTER — Encounter: Payer: Self-pay | Admitting: Emergency Medicine

## 2023-03-14 ENCOUNTER — Other Ambulatory Visit: Payer: Self-pay

## 2023-03-14 ENCOUNTER — Emergency Department: Payer: BC Managed Care – PPO

## 2023-03-14 DIAGNOSIS — I10 Essential (primary) hypertension: Secondary | ICD-10-CM | POA: Diagnosis not present

## 2023-03-14 DIAGNOSIS — R0602 Shortness of breath: Secondary | ICD-10-CM | POA: Insufficient documentation

## 2023-03-14 DIAGNOSIS — J45909 Unspecified asthma, uncomplicated: Secondary | ICD-10-CM | POA: Diagnosis not present

## 2023-03-14 DIAGNOSIS — R110 Nausea: Secondary | ICD-10-CM | POA: Diagnosis not present

## 2023-03-14 DIAGNOSIS — R002 Palpitations: Secondary | ICD-10-CM | POA: Diagnosis not present

## 2023-03-14 LAB — COMPREHENSIVE METABOLIC PANEL
ALT: 13 U/L (ref 0–44)
AST: 20 U/L (ref 15–41)
Albumin: 4.7 g/dL (ref 3.5–5.0)
Alkaline Phosphatase: 102 U/L (ref 38–126)
Anion gap: 12 (ref 5–15)
BUN: 12 mg/dL (ref 6–20)
CO2: 20 mmol/L — ABNORMAL LOW (ref 22–32)
Calcium: 9.4 mg/dL (ref 8.9–10.3)
Chloride: 105 mmol/L (ref 98–111)
Creatinine, Ser: 0.62 mg/dL (ref 0.44–1.00)
GFR, Estimated: >60 mL/min (ref >60)
Glucose, Bld: 88 mg/dL (ref 70–99)
Potassium: 3.3 mmol/L — ABNORMAL LOW (ref 3.5–5.1)
Sodium: 137 mmol/L (ref 135–145)
Total Bilirubin: 0.5 mg/dL (ref 0.3–1.2)
Total Protein: 8.2 g/dL — ABNORMAL HIGH (ref 6.5–8.1)

## 2023-03-14 LAB — CBC WITH DIFFERENTIAL/PLATELET
HCT: 40.6 % (ref 36.0–46.0)
Platelets: 301 10*3/uL (ref 150–400)
RDW: 13.9 % (ref 11.5–15.5)
WBC: 11.1 10*3/uL — ABNORMAL HIGH (ref 4.0–10.5)
nRBC: 0 % (ref 0.0–0.2)

## 2023-03-14 LAB — TROPONIN I (HIGH SENSITIVITY): Troponin I (High Sensitivity): 3 ng/L (ref <18)

## 2023-03-14 NOTE — ED Triage Notes (Signed)
  Patient BIB EMS for SOB and nausea that started around an hour ago.  Patient states she was diagnosed with mild cellulitis and ear infection 2 days ago and started Keflex and ear drops.  Patient states she was swimming outside and when she got out she started having SOB.  States the SOB is intermittent.  Pain 2/10, chronic lower back pain.

## 2023-03-15 ENCOUNTER — Emergency Department
Admission: EM | Admit: 2023-03-15 | Discharge: 2023-03-15 | Disposition: A | Discharge status: Home / Self Care | Payer: BC Managed Care – PPO | Attending: Emergency Medicine | Admitting: Emergency Medicine

## 2023-03-15 DIAGNOSIS — R0602 Shortness of breath: Secondary | ICD-10-CM

## 2023-03-15 DIAGNOSIS — R002 Palpitations: Secondary | ICD-10-CM

## 2023-03-15 LAB — CBC WITH DIFFERENTIAL/PLATELET
Abs Immature Granulocytes: 0.03 10*3/uL (ref 0.00–0.07)
Basophils Absolute: 0 10*3/uL (ref 0.0–0.1)
Basophils Relative: 0 %
Eosinophils Absolute: 0.2 10*3/uL (ref 0.0–0.5)
Eosinophils Relative: 2 %
Hemoglobin: 13.3 g/dL (ref 12.0–15.0)
Immature Granulocytes: 0 %
Lymphs Abs: 5.7 10*3/uL — ABNORMAL HIGH (ref 0.7–4.0)
MCH: 26.8 pg (ref 26.0–34.0)
MCHC: 32.8 g/dL (ref 30.0–36.0)
MCV: 81.9 fL (ref 80.0–100.0)
Monocytes Absolute: 0.7 10*3/uL (ref 0.1–1.0)
Monocytes Relative: 6 %
Neutro Abs: 4.4 10*3/uL (ref 1.7–7.7)
Neutrophils Relative %: 40 %
RBC: 4.96 MIL/uL (ref 3.87–5.11)

## 2023-03-15 LAB — TROPONIN I (HIGH SENSITIVITY): Troponin I (High Sensitivity): 2 ng/L (ref <18)

## 2023-03-15 MED ORDER — HYDROXYZINE HCL 25 MG PO TABS
50.0000 mg | ORAL_TABLET | Freq: Once | ORAL | Status: AC
  Administered 2023-03-15: 50 mg via ORAL
  Filled 2023-03-15: qty 2

## 2023-03-15 MED ORDER — HYDROXYZINE PAMOATE 50 MG PO CAPS: 50.0000 mg | ORAL_CAPSULE | Freq: Three times a day (TID) | ORAL | 0 refills | Status: AC | PRN

## 2023-03-15 NOTE — ED Provider Notes (Signed)
[facility] Provider Note   Event Date/Time   First MD Initiated Contact with Patient 03/15/23 0032     (approximate) History  Shortness of Breath and Nausea  HPI Eileen Chaney is a 47 y.o. female with a past medical history of generalized anxiety disorder, hyperlipidemia, gestational diabetes, and hypertension who presents complaining of shortness of breath and nausea that began about an hour ago while she was arguing with her significant other and apparently very emotionally distraught.  Patient states that the symptoms came on over the course of approximately 2 minutes and were associated with significant shortness of breath.  Patient did not use her albuterol inhaler despite history of asthma.  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  Patient denies any history of panic attacks and does not take any medications when the symptoms arise.  Patient denies exertional worsening.  Patient denies any radiation ROS: Patient currently denies any vision changes, tinnitus, difficulty speaking, facial droop, sore throat, chest pain, abdominal pain, nausea/vomiting/diarrhea, dysuria, or weakness/numbness/paresthesias in any extremity   Physical Exam  Triage Vital Signs: ED Triage Vitals [03/14/23 2249]  Enc Vitals Group     BP 126/81     Pulse Rate 89     Resp 18     Temp 97.9 F (36.6 C)     Temp Source Oral     SpO2 100 %     Weight 259 lb (117.5 kg)     Height 5\' 8"  (1.727 m)     Head Circumference      Peak Flow      Pain Score 2     Pain Loc      Pain Edu?      Excl. in GC?    Most recent vital signs: Vitals:   03/14/23 2249 03/15/23 0147  BP: 126/81   Pulse: 89   Resp: 18   Temp: 97.9 F (36.6 C) 97.6 F (36.4 C)  SpO2: 100%    General: Awake, oriented x4. CV:  Good peripheral perfusion.  Resp:  Normal effort.  Clear to auscultation  bilaterally Abd:  No distention.  Other:  Middle-aged obese African-American female laying in bed in no acute distress ED Results / Procedures / Treatments  Labs (all labs ordered are listed, but only abnormal results are displayed) Labs Reviewed  CBC WITH DIFFERENTIAL/PLATELET - Abnormal; Notable for the following components:      Result Value   WBC 11.1 (*)    Lymphs Abs 5.7 (*)    All other components within normal limits  COMPREHENSIVE METABOLIC PANEL - Abnormal; Notable for the following components:   Potassium 3.3 (*)    CO2 20 (*)    Total Protein 8.2 (*)    All other components within normal limits  TROPONIN I (HIGH SENSITIVITY)  TROPONIN I (HIGH SENSITIVITY)   EKG ED ECG REPORT I, Merwyn Katos, the attending physician, personally viewed and interpreted this ECG. Date: 03/15/2023 EKG Time: 2250 Rate: 89 Rhythm: normal sinus rhythm QRS Axis: normal Intervals: normal ST/T Wave abnormalities: normal Narrative Interpretation: no evidence of acute ischemia RADIOLOGY ED MD interpretation: 2 view chest x-ray interpreted by me shows no evidence of acute abnormalities including no pneumonia, pneumothorax, or widened mediastinum -Agree with radiology assessment Official radiology report(s): DG Chest 2 View  Result Date: 03/14/2023 CLINICAL DATA:  Shortness of breath EXAM: CHEST - 2 VIEW COMPARISON:  Chest x-ray 11/12/2021 FINDINGS: The heart size and mediastinal contours are within normal limits. Both lungs are clear. The visualized skeletal structures are unremarkable. IMPRESSION: No active cardiopulmonary disease. Electronically Signed   By: Darliss Cheney M.D.   On: 03/14/2023 23:15   PROCEDURES: Critical Care performed: No .1-3 Lead EKG Interpretation  Performed by: Merwyn Katos, MD Authorized by: Merwyn Katos, MD     Interpretation: normal     ECG rate:  71   ECG rate assessment: normal     Rhythm: sinus rhythm     Ectopy: none     Conduction: normal     MEDICATIONS ORDERED IN ED: Medications  hydrOXYzine (ATARAX) tablet 50 mg (50 mg Oral Given 03/15/23 0146)   IMPRESSION / MDM / ASSESSMENT AND PLAN / ED COURSE  I reviewed the triage vital signs and the nursing notes.                             The patient is on the cardiac monitor to evaluate for evidence of arrhythmia and/or significant heart rate changes. Patient's presentation is most consistent with acute presentation with potential threat to life or bodily function. The patient is suffering from shortness of breath, but the immediate cause is not apparent.  Potential emergent causes considered include, but are not limited to, asthma or COPD, congestive heart failure, pulmonary embolism, pneumothorax, coronary syndrome, pneumonia, and pleural effusion.  Despite the evaluation including history, exam, and testing, the cause of the shortness of breath remains unclear. However, during the ED stay, patients condition improved, and at the time of discharge the shortness of breath is resolved, they are feeling well, and want to go home.  Patient will be discharged with strict return precautions and advice to follow up with primary MD within 24 hours for further evaluation.   FINAL CLINICAL IMPRESSION(S) / ED DIAGNOSES   Final diagnoses:  Shortness of breath  Intermittent palpitations   Rx / DC Orders   ED Discharge Orders          Ordered    hydrOXYzine (VISTARIL) 50 MG capsule  3 times daily PRN        03/15/23 0235           Note:  This document was prepared using Dragon voice recognition software and may include unintentional dictation errors.   Merwyn Katos, MD 03/15/23 [phone]

## 2023-05-21 LAB — POC SOFIA 2 FLU + SARS ANTIGEN FIA: SARS Coronavirus 2 Ag: NEGATIVE

## 2023-09-04 ENCOUNTER — Other Ambulatory Visit: Payer: Self-pay

## 2023-09-04 ENCOUNTER — Ambulatory Visit (INDEPENDENT_AMBULATORY_CARE_PROVIDER_SITE_OTHER): Payer: Self-pay | Admitting: Adult Health

## 2023-09-04 ENCOUNTER — Encounter: Payer: Self-pay | Admitting: Adult Health

## 2023-09-04 VITALS — HR 101 | Temp 94.9°F | Ht 68.0 in | Wt 208.0 lb

## 2023-09-04 DIAGNOSIS — R5383 Other fatigue: Secondary | ICD-10-CM

## 2023-09-04 DIAGNOSIS — J029 Acute pharyngitis, unspecified: Secondary | ICD-10-CM

## 2023-09-04 DIAGNOSIS — H1031 Unspecified acute conjunctivitis, right eye: Secondary | ICD-10-CM

## 2023-09-04 LAB — POC SOFIA 2 FLU + SARS ANTIGEN FIA
Influenza A, POC: NEGATIVE
Influenza B, POC: NEGATIVE
SARS Coronavirus 2 Ag: NEGATIVE

## 2023-09-04 MED ORDER — GENTAMICIN SULFATE 0.3 % OP SOLN
1.0000 [drp] | OPHTHALMIC | 0 refills | Status: DC
Start: 2023-09-04 — End: 2023-11-20

## 2023-09-04 NOTE — Progress Notes (Signed)
[facility] [address] [place], [place] [address]   Office Visit Note  Patient Name: Eileen Chaney Date of Birth [id number]  Medical Record number [id number]  Date of Service: 09/04/2023  Chief Complaint  Patient presents with   Acute Visit    Patient c/o R eye pain with itching. She states when she woke up her eye was crusty. She has been around several coworkers who were recently diagnosed with pink eye.      HPI Pt is here for a sick visit. She reports since last night her right eye has been hurting.  She describes crusting in the eye.  She has not seen any redness, but is concerned.  She also has some ear itching.  A few coworkers had pink eye last week. She also has been having congestion and fatigue.  She reports laying in bed all day today, and is now exhausted from coming to clinic.    Current Medication:  Outpatient Encounter Medications as of 09/04/2023  Medication Sig   ADVAIR DISKUS 100-50 MCG/ACT AEPB Inhale 1 puff into the lungs as needed.   albuterol (VENTOLIN HFA) 108 (90 Base) MCG/ACT inhaler 1-2 puffs every 4-6 hours as needed for cough or wheezing. Rinse mouth after use   atorvastatin (LIPITOR) 40 MG tablet Take 1 tablet (40 mg total) by mouth daily.   EPINEPHrine 0.3 mg/0.3 mL IJ SOAJ injection Inject 0.3 mg into the muscle as needed for anaphylaxis.   fluticasone (FLONASE) 50 MCG/ACT nasal spray Place 2 sprays into both nostrils daily.   gentamicin (GARAMYCIN) 0.3 % ophthalmic solution Place 1 drop into the right eye every 4 (four) hours.   methotrexate (RHEUMATREX) 2.5 MG tablet Take 5 tablets by mouth once a week.   minoxidil (LONITEN) 2.5 MG tablet Take 2.5 mg by mouth daily.   Multiple Vitamin (MULTI-VITAMIN) tablet Take 1 tablet by mouth daily.   naproxen sodium (ALEVE) 220 MG tablet Take by mouth at bedtime.   orphenadrine (NORFLEX) 100 MG tablet Take 100 mg by mouth daily as needed for muscle spasms.   pregabalin (LYRICA) 25 MG capsule Take 50  mg by mouth 2 (two) times daily.   spironolactone (ALDACTONE) 100 MG tablet Take 100 mg by mouth 2 (two) times daily.   tirzepatide [facility]) 5 MG/0.5ML Pen Inject 5 mg into the skin once a week.   traMADol (ULTRAM) 50 MG tablet Take 50 mg by mouth as needed.   venlafaxine (EFFEXOR) 75 MG tablet Take 75 mg by mouth daily.   Vitamin D, Ergocalciferol, (DRISDOL) 1.25 MG (50000 UNIT) CAPS capsule Take 50,000 Units by mouth every 7 (seven) days.   [DISCONTINUED] clotrimazole-betamethasone (LOTRISONE) cream Apply 1 Application topically 2 (two) times daily.   [DISCONTINUED] FLUoxetine (PROZAC) 20 MG capsule Take 20 mg by mouth daily.   [DISCONTINUED] NALTREXONE HCL PO Take 4.5 mg by mouth daily.   No facility-administered encounter medications on file as of 09/04/2023.      Medical History: Past Medical History:  Diagnosis Date   Anxiety    Arthritis    Asthma    Cervical myelopathy with cervical radiculopathy (HCC)    Complication of anesthesia    DDD (degenerative disc disease), cervical    Depression    Diabetes mellitus without complication (HCC)    type 2   Family history of adverse reaction to anesthesia    mother hard to wake up   Fibromyalgia    Hyperlipidemia    Hypertension    Idiopathic  urticaria    Pneumonia    PONV (postoperative nausea and vomiting)    Undifferentiated inflammatory arthritis (HCC)      Vital Signs: Pulse (!) 101   Temp (!) 94.9 F (34.9 C)   Ht 5\' 8"  (1.727 m)   Wt 208 lb (94.3 kg)   SpO2 95%   BMI 31.63 kg/m    Review of Systems  Constitutional:  Positive for fatigue. Negative for chills and fever.  HENT:  Positive for congestion.     Physical Exam Vitals reviewed.  Constitutional:      Appearance: Normal appearance.  HENT:     Head: Normocephalic.     Right Ear: Tympanic membrane and ear canal normal.     Left Ear: Tympanic membrane and ear canal normal.     Nose: Nose normal.     Mouth/Throat:     Mouth: Mucous membranes  are moist.  Eyes:     General: Lids are normal.     Conjunctiva/sclera:     Right eye: Right conjunctiva is injected. Exudate present.     Left eye: Left conjunctiva is not injected. No exudate.    Pupils: Pupils are equal, round, and reactive to light.  Pulmonary:     Effort: Pulmonary effort is normal.  Lymphadenopathy:     Cervical: No cervical adenopathy.  Neurological:     Mental Status: She is alert.    Assessment/Plan: 1. Acute bacterial conjunctivitis of right eye Use Gentamicin eye drops, 1 drop to affected eye(s) every 4 hours while awake.  Use them until symptosm resolve.  Wash hands frequently, and avoid touching eyes.   - gentamicin (GARAMYCIN) 0.3 % ophthalmic solution; Place 1 drop into the right eye every 4 (four) hours.  Dispense: 5 mL; Refill: 0  2. Sore throat Evaluate CBC for viral vs bacterial illness - CBC w/Diff - POC SOFIA 2 FLU + SARS ANTIGEN FIA  3. Other fatigue - CBC w/Diff        General Counseling: Eileen Chaney verbalizes understanding of the findings of todays visit and agrees with plan of treatment. I have discussed any further diagnostic evaluation that may be needed or ordered today. We also reviewed her medications today. she has been encouraged to call the office with any questions or concerns that should arise related to todays visit.   Orders Placed This Encounter  Procedures   CBC w/Diff   POC SOFIA 2 FLU + SARS ANTIGEN FIA    Meds ordered this encounter  Medications   gentamicin (GARAMYCIN) 0.3 % ophthalmic solution    Sig: Place 1 drop into the right eye every 4 (four) hours.    Dispense:  5 mL    Refill:  0    Time spent:15 Minutes    Johnna Acosta AGNP-C Nurse Practitioner

## 2023-09-05 ENCOUNTER — Encounter: Payer: Self-pay | Admitting: Adult Health

## 2023-09-05 LAB — CBC WITH DIFFERENTIAL/PLATELET
Basophils Absolute: 0 10*3/uL (ref 0.0–0.2)
Basos: 0 %
EOS (ABSOLUTE): 0.1 10*3/uL (ref 0.0–0.4)
Eos: 1 %
Hematocrit: 42 % (ref 34.0–46.6)
Hemoglobin: 13.5 g/dL (ref 11.1–15.9)
Immature Grans (Abs): 0 10*3/uL (ref 0.0–0.1)
Immature Granulocytes: 0 %
Lymphocytes Absolute: 3.7 10*3/uL — ABNORMAL HIGH (ref 0.7–3.1)
Lymphs: 41 %
MCH: 27.1 pg (ref 26.6–33.0)
MCHC: 32.1 g/dL (ref 31.5–35.7)
MCV: 84 fL (ref 79–97)
Monocytes Absolute: 0.6 10*3/uL (ref 0.1–0.9)
Monocytes: 6 %
Neutrophils Absolute: 4.6 10*3/uL (ref 1.4–7.0)
Neutrophils: 52 %
Platelets: 338 10*3/uL (ref 150–450)
RBC: 4.98 x10E6/uL (ref 3.77–5.28)
RDW: 16.3 % — ABNORMAL HIGH (ref 11.7–15.4)
WBC: 8.9 10*3/uL (ref 3.4–10.8)

## 2023-09-20 IMAGING — RF DG CERVICAL SPINE 2 OR 3 VIEWS
1 series · 3 of 3 positions shown · non-contrast
Comparison: None.

CLINICAL DATA: C3-5 ACDF

EXAM:
CERVICAL SPINE - 2-3 VIEW; DG C-ARM 1-60 MIN-NO REPORT

[Series 1: dg x-ray · 0.20mm/px · 3 of 3 slices shown]
[im 1/3]
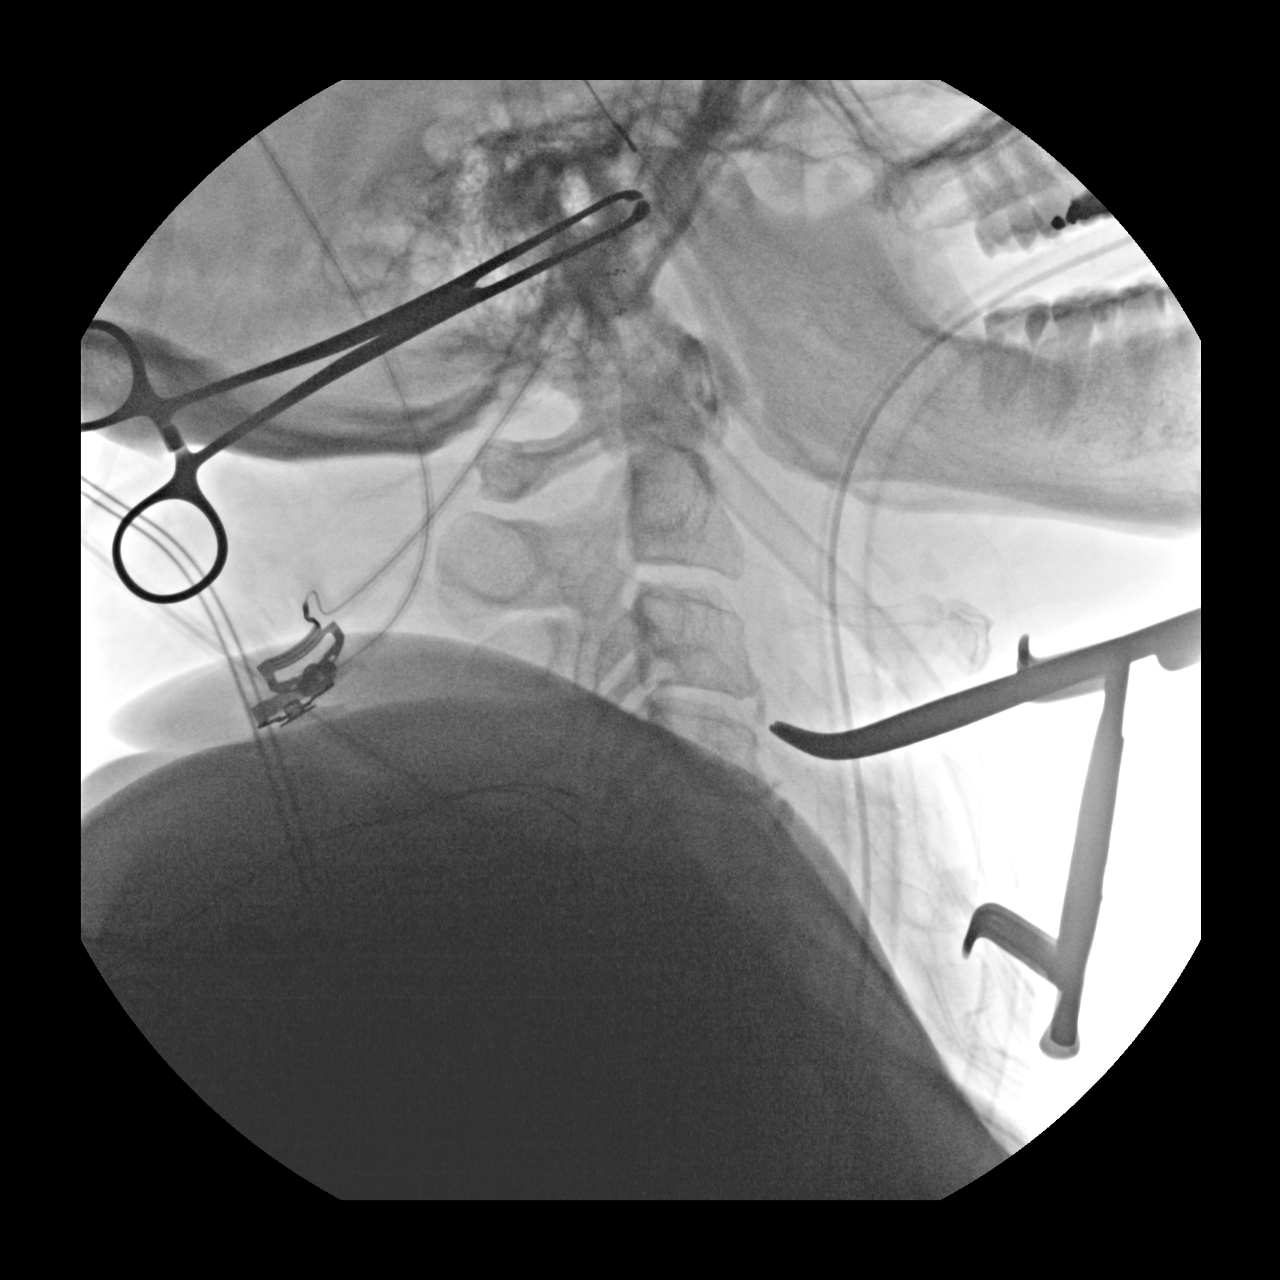
[im 2/3]
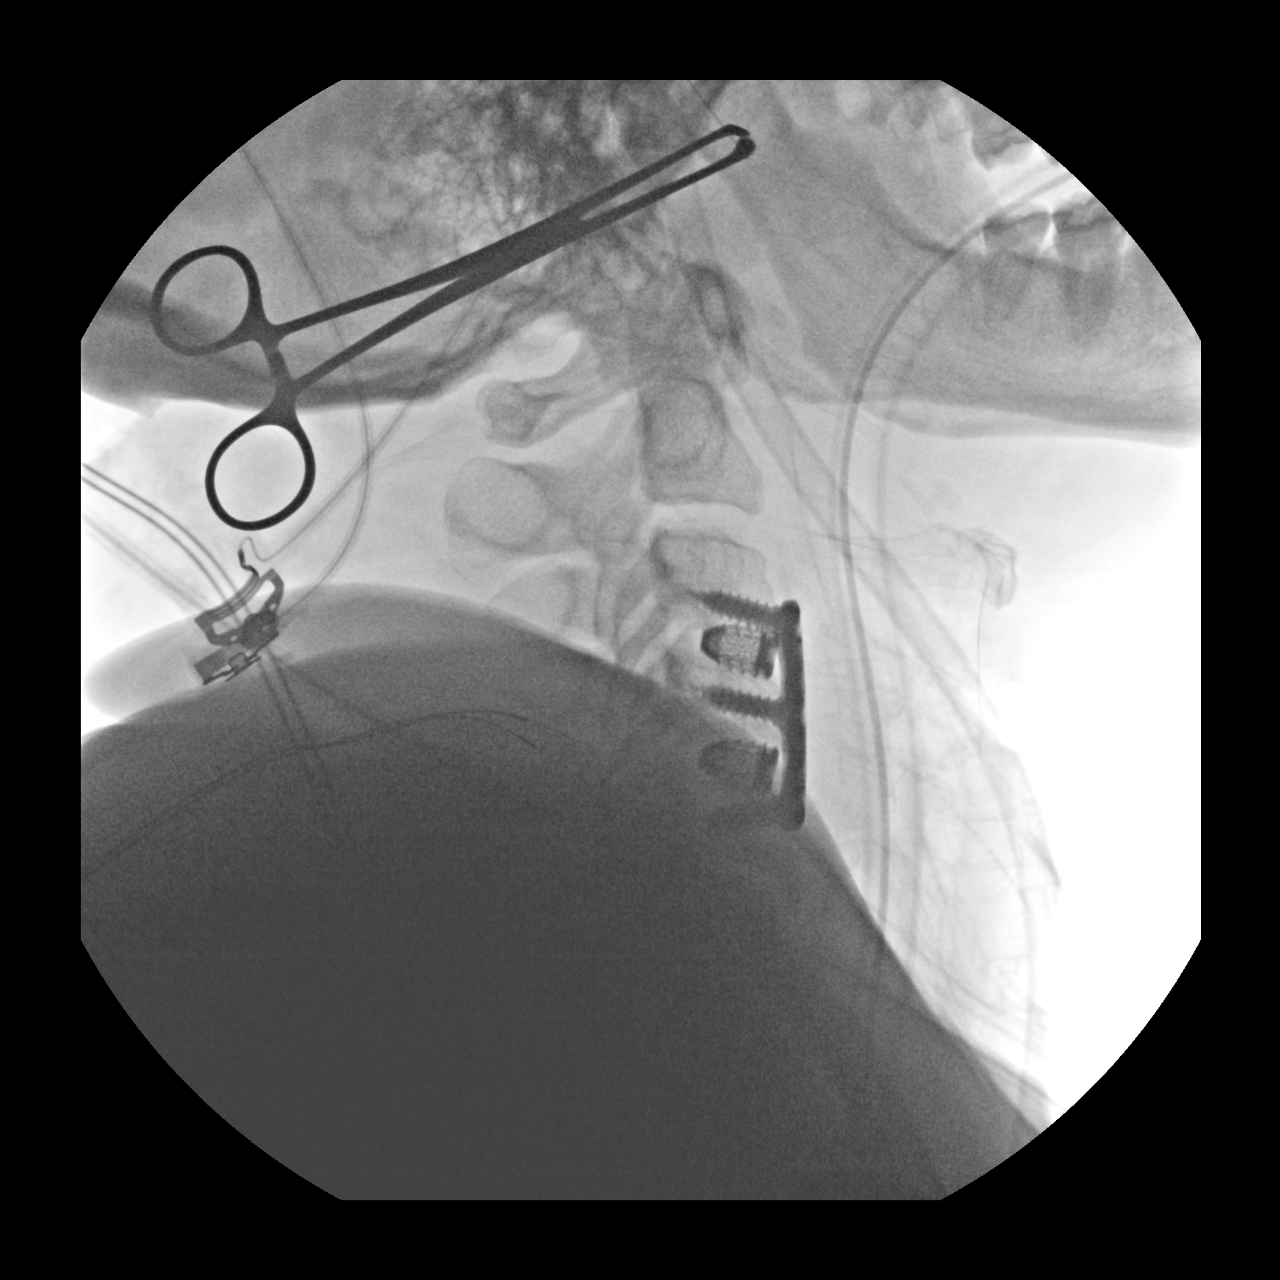
[im 3/3]
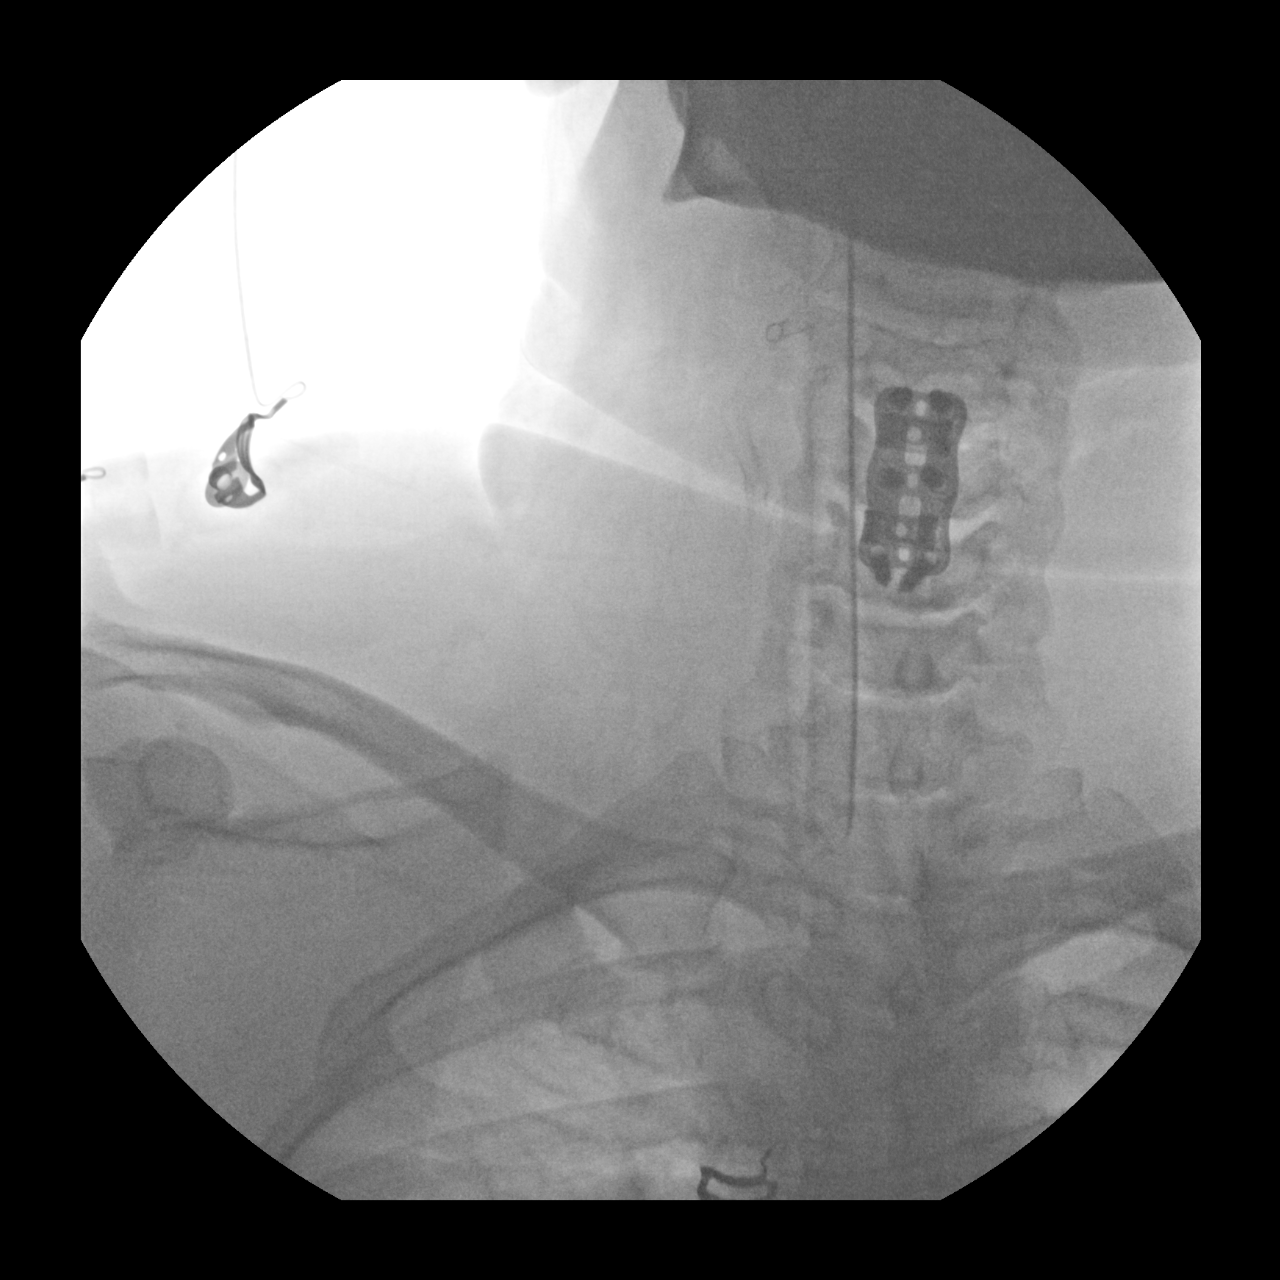

[3 of 3 positions shown; findings below may reference images not displayed]

FLUOROSCOPY TIME:  Radiation Exposure Index (if provided by the
fluoroscopic device): 0.1544 mGy
FINDINGS: Nondiagnostic spot fluoroscopic intraoperative cervical spine
radiographs demonstrate expected postsurgical changes from ACDF
C3-5. Oral route tube enters the trachea with the tip overlying
thoracic inlet on the frontal view.
IMPRESSION: Intraoperative fluoroscopic guidance for ACDF C3-5.

## 2023-09-20 IMAGING — DX DG CHEST 1V
1 series · 1 of 1 positions shown · non-contrast
Comparison: None.

CLINICAL DATA: Postop pain

EXAM:
CHEST  1 VIEW

[chest pa]
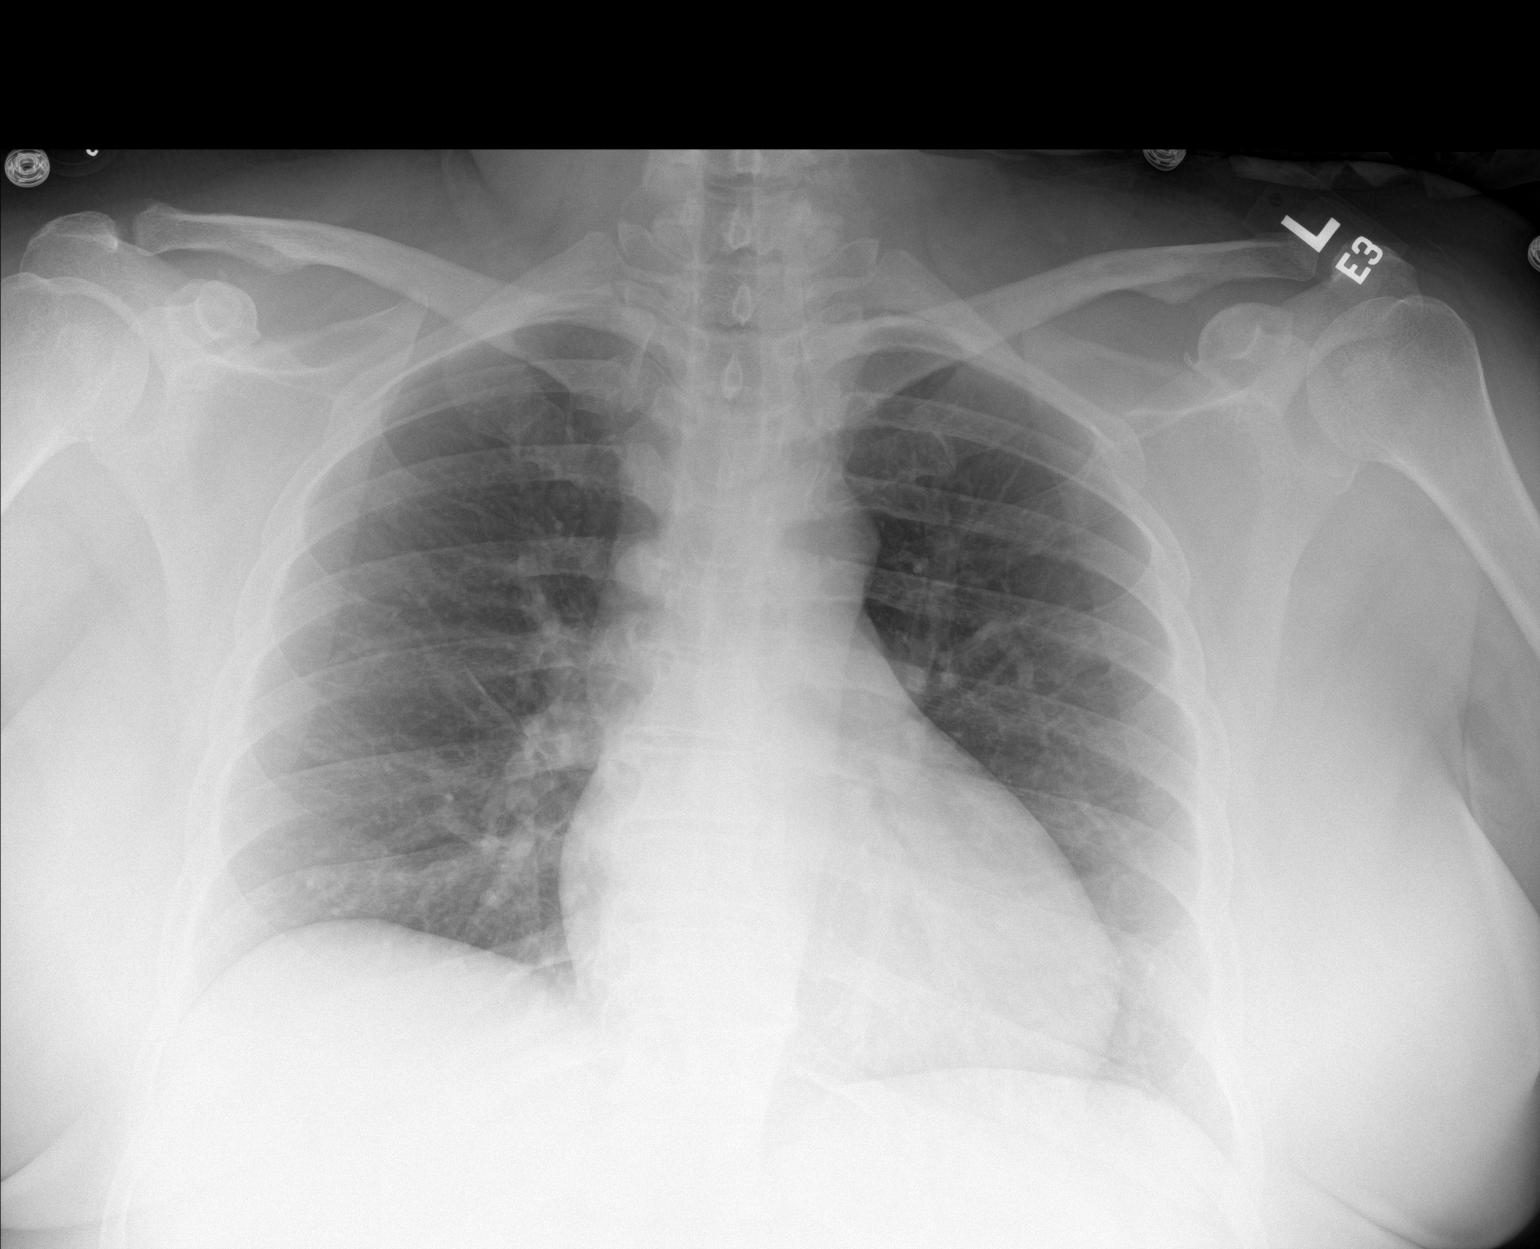

[1 of 1 positions shown; findings below may reference images not displayed]

FINDINGS: Low lung volumes. The heart size and mediastinal contours are within
normal limits. No focal pulmonary opacity. No pleural effusion or
pneumothorax. Partially visualized ACDF. The visualized skeletal
structures are otherwise unremarkable.
IMPRESSION: No acute cardiopulmonary process.

## 2023-11-05 ENCOUNTER — Emergency Department: Payer: BC Managed Care – PPO

## 2023-11-05 ENCOUNTER — Emergency Department
Admission: EM | Admit: 2023-11-05 | Discharge: 2023-11-05 | Disposition: A | Discharge status: Home / Self Care | Payer: BC Managed Care – PPO | Attending: Emergency Medicine | Admitting: Emergency Medicine

## 2023-11-05 ENCOUNTER — Other Ambulatory Visit: Payer: Self-pay

## 2023-11-05 DIAGNOSIS — I10 Essential (primary) hypertension: Secondary | ICD-10-CM | POA: Diagnosis not present

## 2023-11-05 DIAGNOSIS — R42 Dizziness and giddiness: Secondary | ICD-10-CM | POA: Diagnosis not present

## 2023-11-05 DIAGNOSIS — R519 Headache, unspecified: Secondary | ICD-10-CM | POA: Insufficient documentation

## 2023-11-05 DIAGNOSIS — R197 Diarrhea, unspecified: Secondary | ICD-10-CM | POA: Diagnosis not present

## 2023-11-05 DIAGNOSIS — Z20822 Contact with and (suspected) exposure to covid-19: Secondary | ICD-10-CM | POA: Insufficient documentation

## 2023-11-05 DIAGNOSIS — E119 Type 2 diabetes mellitus without complications: Secondary | ICD-10-CM | POA: Diagnosis not present

## 2023-11-05 DIAGNOSIS — J45909 Unspecified asthma, uncomplicated: Secondary | ICD-10-CM | POA: Diagnosis not present

## 2023-11-05 DIAGNOSIS — R112 Nausea with vomiting, unspecified: Secondary | ICD-10-CM | POA: Diagnosis present

## 2023-11-05 LAB — BASIC METABOLIC PANEL
Anion gap: 13 (ref 5–15)
BUN: 15 mg/dL (ref 6–20)
CO2: 20 mmol/L — ABNORMAL LOW (ref 22–32)
Calcium: 9.1 mg/dL (ref 8.9–10.3)
Chloride: 104 mmol/L (ref 98–111)
Creatinine, Ser: 0.71 mg/dL (ref 0.44–1.00)
GFR, Estimated: >60 mL/min (ref >60)
Glucose, Bld: 99 mg/dL (ref 70–99)
Potassium: 3.6 mmol/L (ref 3.5–5.1)
Sodium: 137 mmol/L (ref 135–145)

## 2023-11-05 LAB — HEPATIC FUNCTION PANEL
ALT: 13 U/L (ref 0–44)
AST: 15 U/L (ref 15–41)
Albumin: 3.7 g/dL (ref 3.5–5.0)
Alkaline Phosphatase: 91 U/L (ref 38–126)
Bilirubin, Direct: <0.1 mg/dL (ref 0.0–0.2)
Total Bilirubin: 0.6 mg/dL (ref 0.0–1.2)
Total Protein: 7.2 g/dL (ref 6.5–8.1)

## 2023-11-05 LAB — CBC
HCT: 38.4 % (ref 36.0–46.0)
Hemoglobin: 12.7 g/dL (ref 12.0–15.0)
MCH: 28.9 pg (ref 26.0–34.0)
MCHC: 33.1 g/dL (ref 30.0–36.0)
MCV: 87.5 fL (ref 80.0–100.0)
Platelets: 305 10*3/uL (ref 150–400)
RBC: 4.39 MIL/uL (ref 3.87–5.11)
RDW: 14.6 % (ref 11.5–15.5)
WBC: 9.9 10*3/uL (ref 4.0–10.5)
nRBC: 0 % (ref 0.0–0.2)

## 2023-11-05 LAB — CBG MONITORING, ED: Glucose-Capillary: 94 mg/dL (ref 70–99)

## 2023-11-05 LAB — PROCALCITONIN: Procalcitonin: <0.1 ng/mL

## 2023-11-05 LAB — LIPASE, BLOOD: Lipase: 43 U/L (ref 11–51)

## 2023-11-05 LAB — RESP PANEL BY RT-PCR (RSV, FLU A&B, COVID)  RVPGX2
Influenza A by PCR: NEGATIVE
Influenza B by PCR: NEGATIVE
Resp Syncytial Virus by PCR: NEGATIVE
SARS Coronavirus 2 by RT PCR: NEGATIVE

## 2023-11-05 MED ORDER — IBUPROFEN 800 MG PO TABS: 800.0000 mg | ORAL_TABLET | Freq: Three times a day (TID) | ORAL | 0 refills | Status: DC | PRN

## 2023-11-05 MED ORDER — ONDANSETRON 4 MG PO TBDP: 4.0000 mg | ORAL_TABLET | Freq: Four times a day (QID) | ORAL | 0 refills | Status: DC | PRN

## 2023-11-05 MED ORDER — MECLIZINE HCL 25 MG PO TABS: 25.0000 mg | ORAL_TABLET | Freq: Three times a day (TID) | ORAL | 0 refills | Status: DC | PRN

## 2023-11-05 MED ORDER — KETOROLAC TROMETHAMINE 30 MG/ML IJ SOLN
30.0000 mg | Freq: Once | INTRAMUSCULAR | Status: AC
  Administered 2023-11-05: 30 mg via INTRAVENOUS
  Filled 2023-11-05: qty 1

## 2023-11-05 MED ORDER — METOCLOPRAMIDE HCL 5 MG/ML IJ SOLN
10.0000 mg | Freq: Once | INTRAMUSCULAR | Status: AC
  Administered 2023-11-05: 10 mg via INTRAVENOUS
  Filled 2023-11-05: qty 2

## 2023-11-05 MED ORDER — DIPHENHYDRAMINE HCL 50 MG/ML IJ SOLN
25.0000 mg | Freq: Once | INTRAMUSCULAR | Status: AC
  Administered 2023-11-05: 25 mg via INTRAVENOUS
  Filled 2023-11-05: qty 1

## 2023-11-05 MED ORDER — LACTATED RINGERS IV BOLUS
1000.0000 mL | Freq: Once | INTRAVENOUS | Status: AC
  Administered 2023-11-05: 999 mL via INTRAVENOUS

## 2023-11-05 MED ORDER — MECLIZINE HCL 25 MG PO TABS
50.0000 mg | ORAL_TABLET | Freq: Once | ORAL | Status: AC
  Administered 2023-11-05: 50 mg via ORAL
  Filled 2023-11-05: qty 2

## 2023-11-05 NOTE — ED Triage Notes (Signed)
 Pt reports headache n/v/d x2 days. Pt denies cough congestion or fever.

## 2023-11-05 NOTE — ED Provider Notes (Signed)
 [facility] Provider Note    Event Date/Time   First MD Initiated Contact with Patient 11/05/23 0122     (approximate)   History   Headache   HPI  Eileen Chaney is a 48 y.o. female with history of of hypertension, diabetes, hyperlipidemia, fibromyalgia who presents to the emergency department with complaints of several days of nausea, vomiting and diarrhea and now severe throbbing headache, vertigo.  No numbness, tingling or weakness.  No head injury.  Not on blood thinners.  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  No chest pain, shortness of breath, cough or congestion.  No history of migraines or chronic headaches.  She denies dysuria, hematuria, vaginal bleeding or discharge.  She has had previous hysterectomy, C-section x 3.   History provided by patient, family.    Past Medical History:  Diagnosis Date   Anxiety    Arthritis    Asthma    Cervical myelopathy with cervical radiculopathy (HCC)    Complication of anesthesia    DDD (degenerative disc disease), cervical    Depression    Diabetes mellitus without complication (HCC)    type 2   Family history of adverse reaction to anesthesia    mother hard to wake up   Fibromyalgia    Hyperlipidemia    Hypertension    Idiopathic urticaria    Pneumonia    PONV (postoperative nausea and vomiting)    Undifferentiated inflammatory arthritis (HCC)     Past Surgical History:  Procedure Laterality Date   ABDOMINAL HYSTERECTOMY  2018   uterine fibroids   ANKLE SURGERY Right 2022   ligament repair   ANTERIOR CERVICAL DECOMP/DISCECTOMY FUSION N/A 11/12/2021   Procedure: C3-5 ANTERIOR CERVICAL DISCECTOMY AND FUSION (GLOBUS HEDRON);  Surgeon: Clois Fret, MD;  Location: [facility] ORS;  Service: Neurosurgery;  Laterality: N/A;   CERVICAL WOUND  DEBRIDEMENT N/A 01/20/2022   Procedure: posterior wound debridment;  Surgeon: Clois Fret, MD;  Location: [facility] ORS;  Service: Neurosurgery;  Laterality: N/A;   CESAREAN SECTION     X3 2000, 2001, 2009   POSTERIOR CERVICAL LAMINECTOMY N/A 01/07/2022   Procedure: OPEN C5-7 POSTERIOR DECOMPRESSION;  Surgeon: Clois Fret, MD;  Location: [facility] ORS;  Service: Neurosurgery;  Laterality: N/A;   TONSILLECTOMY  2000    MEDICATIONS:  Prior to Admission medications   Medication Sig Start Date End Date Taking? Authorizing Provider  ADVAIR DISKUS 100-50 MCG/ACT AEPB Inhale 1 puff into the lungs as needed. 09/02/22   [provider]  albuterol  (VENTOLIN  HFA) 108 (90 Base) MCG/ACT inhaler 1-2 puffs every 4-6 hours as needed for cough or wheezing. Rinse mouth after use 09/07/21   Kennyth Domino, FNP  atorvastatin  (LIPITOR) 40 MG tablet Take 1 tablet (40 mg total) by mouth daily. 06/02/20   Betancourt, Ellouise LABOR, NP  EPINEPHrine  0.3 mg/0.3 mL IJ SOAJ injection Inject 0.3 mg into the muscle as needed for anaphylaxis. 03/26/19   [provider]  fluticasone  (FLONASE ) 50 MCG/ACT nasal spray Place 2 sprays into both nostrils daily.    [provider]  gentamicin  (GARAMYCIN ) 0.3 % ophthalmic solution Place 1 drop into the right eye every 4 (four) hours. 09/04/23   Scarboro, Adam J, NP  methotrexate (RHEUMATREX) 2.5 MG tablet Take 5 tablets by mouth once a week. 08/11/23   [provider]  minoxidil  (LONITEN ) 2.5 MG tablet Take 2.5 mg by mouth daily.    [provider]  Multiple Vitamin (MULTI-VITAMIN) tablet Take 1 tablet by mouth daily.    [provider]  naproxen sodium (ALEVE) 220 MG tablet Take by mouth at bedtime.    [provider]  orphenadrine (NORFLEX) 100 MG tablet Take 100 mg by mouth daily as needed for muscle spasms.    [provider]  pregabalin  (LYRICA ) 25 MG capsule Take 50 mg by mouth 2 (two) times daily.    [provider]  spironolactone  (ALDACTONE ) 100 MG tablet Take 100 mg by mouth 2 (two) times daily.    [provider]  tirzepatide CLOYDE) 5 MG/0.5ML Pen Inject 5 mg into the skin once a week.    [provider]  traMADol (ULTRAM) 50 MG tablet Take 50 mg by mouth as needed. 07/11/22   [provider]  venlafaxine  (EFFEXOR ) 75 MG tablet Take 75 mg by mouth daily. 08/23/23   [provider]  Vitamin D , Ergocalciferol , (DRISDOL ) 1.25 MG (50000 UNIT) CAPS capsule Take 50,000 Units by mouth every 7 (seven) days.    [provider]    Physical Exam   Triage Vital Signs: ED Triage Vitals  Encounter Vitals Group     BP 11/05/23 0016 (!) 150/99     Systolic BP Percentile --      Diastolic BP Percentile --      Pulse Rate 11/05/23 0016 100     Resp 11/05/23 0016 20     Temp 11/05/23 0016 98.7 F (37.1 C)     Temp Source 11/05/23 0016 Oral     SpO2 11/05/23 0016 100 %     Weight 11/05/23 0017 212 lb (96.2 kg)     Height 11/05/23 0017 5' 8 (1.727 m)     Head Circumference --      Peak Flow --      Pain Score 11/05/23 0024 7     Pain Loc --      Pain Education --      Exclude from Growth Chart --     Most recent vital signs: Vitals:   11/05/23 0016  BP: (!) 150/99  Pulse: 100  Resp: 20  Temp: 98.7 F (37.1 C)  SpO2: 100%    CONSTITUTIONAL: Alert, responds appropriately to questions.  Appears uncomfortable, afebrile, nontoxic HEAD: Normocephalic, atraumatic EYES: Conjunctivae clear, pupils appear equal, sclera nonicteric ENT: normal nose; moist mucous membranes NECK: Supple, normal ROM, no meningismus, no midline cervical step-off or deformity, is tender to palpation over the occiput bilaterally CARD: RRR; S1 and S2 appreciated RESP: Normal chest excursion without splinting or tachypnea; breath sounds clear and equal bilaterally; no wheezes, no rhonchi, no rales, no hypoxia or respiratory distress, speaking full sentences ABD/GI:  Non-distended; soft, non-tender, no rebound, no guarding, no peritoneal signs BACK: The back appears normal EXT: Normal ROM in all joints; no deformity noted, no edema SKIN: Normal color for age and race; warm; no rash on exposed skin NEURO: Moves all extremities equally, normal speech, normal sensation, no facial asymmetry PSYCH: The patient's mood and manner are appropriate.   ED Results / Procedures / Treatments   LABS: (all labs ordered are listed, but only abnormal results are displayed) Labs Reviewed  BASIC METABOLIC PANEL - Abnormal; Notable for the following components:      Result Value   CO2 20 (*)    All other components within normal  limits  RESP PANEL BY RT-PCR (RSV, FLU A&B, COVID)  RVPGX2  CBC  HEPATIC FUNCTION PANEL  LIPASE, BLOOD  PROCALCITONIN  URINALYSIS, ROUTINE W REFLEX MICROSCOPIC  CBG MONITORING, ED     EKG:     RADIOLOGY: My personal review and interpretation of imaging: CT head unremarkable.  I have personally reviewed all radiology reports.   CT HEAD WO CONTRAST ( ) Result Date: 11/05/2023 CLINICAL DATA:  Sudden severe headache. Also nausea and vomiting. Onset 2 days ago. EXAM: CT HEAD WITHOUT CONTRAST TECHNIQUE: Contiguous axial images were obtained from the base of the skull through the vertex without intravenous contrast. RADIATION DOSE REDUCTION: This exam was performed according to the departmental dose-optimization program which includes automated exposure control, adjustment of the mA and/or kV according to patient size and/or use of iterative reconstruction technique. COMPARISON:  None Available. FINDINGS: Brain: No evidence of acute infarction, hemorrhage, hydrocephalus, extra-axial collection or mass lesion/mass effect. There are benign dural calcifications scattered along the frontal and dorsal falx. 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 Basal cisterns are clear. No midline shift. Vascular: No hyperdense vessel or  unexpected calcification. Skull: Negative for fractures or focal lesions. Sinuses/Orbits: No acute finding. Other: None. IMPRESSION: 1. No acute intracranial CT findings. 2. 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 Electronically Signed   By: Francis Quam M.D.   On: 11/05/2023 02:08     PROCEDURES:  Critical Care performed: No      Procedures    IMPRESSION / MDM / ASSESSMENT AND PLAN / ED COURSE  I reviewed the triage vital signs and the nursing notes.    Patient here with nausea, vomiting, diarrhea and now with headache and vertigo.     DIFFERENTIAL DIAGNOSIS (includes but not limited to):   Viral gastroenteritis, dehydration, electrolyte derangement, intracranial hemorrhage, meningitis, tension headache, migraine, less likely stroke, CVT   Patient's presentation is most consistent with acute presentation with potential threat to life or bodily function.   PLAN: Will obtain labs, CT head, COVID and flu swab.  Will give migraine cocktail, meclizine  and reassess.  I did discuss with patient that I was concerned that she was having severe headache with neck pain and has history of meningitis and reports that this feels similar.  Discussed with patient that the only way that we can truly rule out meningitis would be with a lumbar puncture.  Patient states that she does not want to proceed with a lumbar puncture at this time and would like to see how she feels after medications and what her blood work looks like.  She has afebrile and nontoxic here.  Neurologically intact.   MEDICATIONS GIVEN IN ED: Medications  lactated ringers  bolus 1,000 mL (0 mLs Intravenous Stopped 11/05/23 0242)  ketorolac  (TORADOL ) 30 MG/ML injection 30 mg (30 mg Intravenous Given 11/05/23 0203)  metoCLOPramide  (REGLAN ) injection 10 mg (10 mg Intravenous Given 11/05/23 0203)  diphenhydrAMINE  (BENADRYL ) injection 25 mg (25 mg Intravenous Given 11/05/23 0203)  meclizine  (ANTIVERT ) tablet 50 mg (50 mg Oral  Given 11/05/23 0204)     ED COURSE: Patient's labs show no leukocytosis.  Normal hemoglobin.  Normal electrolytes, creatinine.  LFTs, lipase normal.  Procalcitonin negative.  CT head reviewed and interpreted by myself and the radiologist and shows no acute abnormality.  On reassessment, patient reports feeling much better.  Headache has almost resolved and vertigo has improved.  She is tolerating p.o.  She has been able to ambulate here without assistance.  Discussed again performing a lumbar puncture but patient states given she is feeling better she would declines LP.  Discussed with her that I have low suspicion for bacterial meningitis given length of symptoms and reassuring workup today but this could be viral meningitis and again could not rule out either without further testing but she continues to decline.  I feel she can be discharged home and follow-up with her PCP.  Will discharge with ibuprofen , meclizine , Zofran .  Patient and family comfortable with this plan.   At this time, I do not feel there is any life-threatening condition present. I reviewed all nursing notes, vitals, pertinent previous records.  All lab and urine results, EKGs, imaging ordered have been independently reviewed and interpreted by myself.  I reviewed all available radiology reports from any imaging ordered this visit.  Based on my assessment, I feel the patient is safe to be discharged home without further emergent workup and can continue workup as an outpatient as needed. Discussed all findings, treatment plan as well as usual and customary return precautions.  They verbalize understanding and are comfortable with this plan.  Outpatient follow-up has been provided as needed.  All questions have been answered.    CONSULTS:  none   OUTSIDE RECORDS REVIEWED: Reviewed last pain medicine note on 10/27/2023.       FINAL CLINICAL IMPRESSION(S) / ED DIAGNOSES   Final diagnoses:  Bad headache  Vertigo  Nausea  vomiting and diarrhea     Rx / DC Orders   ED Discharge Orders          Ordered    ibuprofen  (ADVIL ) 800 MG tablet  Every 8 hours PRN        11/05/23 0316    meclizine  (ANTIVERT ) 25 MG tablet  3 times daily PRN        11/05/23 0316    ondansetron  (ZOFRAN -ODT) 4 MG disintegrating tablet  Every 6 hours PRN        11/05/23 0316             Note:  This document was prepared using Dragon voice recognition software and may include unintentional dictation errors.   Aailyah Dunbar, Josette SAILOR, DO 11/05/23 [phone]

## 2023-11-18 NOTE — Progress Notes (Unsigned)
Referring Physician:  Mick Sell, MD [address] [place],  [place] [address]  Primary Physician:  Mick Sell, MD  History of Present Illness: 11/20/2023 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  She underwent neck surgery approximately 2 years ago.  She has developed some symptoms in the same distribution as she had previously.  She has pain into her neck and into her left upper arm.  In her low back, she has had pain on and off for more than 20 years.  She is done physical therapy many times.  She is currently doing physical therapy.  She has pain into her buttocks on the left bilaterally.  Activities make her pain worse.  Nothing really helps significantly except BC powder.  She is currently doing aquatic and regular physical therapy.   Past Surgery:  Cervical fusion on 11/12/2021 Posterior cervical laminectomty on 01/07/2022  Eileen Chaney has no symptoms of cervical myelopathy.  The symptoms are causing a significant impact on the patient's life.   I have utilized the care everywhere function in epic to review the outside records available from external health systems.  Review of Systems:  A 10 point review of systems is negative, except for the pertinent positives and negatives detailed in the HPI.  Past Medical History: Past Medical History:  Diagnosis Date   Anxiety    Arthritis    Asthma    Cervical myelopathy with cervical radiculopathy (HCC)    Complication of anesthesia    DDD (degenerative disc disease), cervical    Depression    Diabetes mellitus without complication (HCC)    type 2   Family history of adverse reaction to anesthesia    mother hard to wake up   Fibromyalgia    Hyperlipidemia    Hypertension    Idiopathic urticaria    Pneumonia    PONV (postoperative nausea and vomiting)    Undifferentiated  inflammatory arthritis (HCC)     Past Surgical History: Past Surgical History:  Procedure Laterality Date   ABDOMINAL HYSTERECTOMY  2018   uterine fibroids   ANKLE SURGERY Right 2022   ligament repair   ANTERIOR CERVICAL DECOMP/DISCECTOMY FUSION N/A 11/12/2021   Procedure: C3-5 ANTERIOR CERVICAL DISCECTOMY AND FUSION (GLOBUS HEDRON);  Surgeon: Venetia Night, MD;  Location: [facility] ORS;  Service: Neurosurgery;  Laterality: N/A;   CERVICAL WOUND DEBRIDEMENT N/A 01/20/2022   Procedure: posterior wound debridment;  Surgeon: Venetia Night, MD;  Location: [facility] OR[facility];  Service: Neurosurgery;  Laterality: N/A;   CESAREAN SECTION     X3 2000, 2001, 2009   POSTERIOR CERVICAL LAMINECTOMY N/A 01/07/2022   Procedure: OPEN C5-7 POSTERIOR DECOMPRESSION;  Surgeon: Venetia Night, MD;  Location: [facility] OR[facility];  Service: Neurosurgery;  Laterality: N/A;   TONSILLECTOMY  2000    Allergies: Allergies as of 11/20/2023 - Review Complete 11/20/2023  Allergen Reaction Noted   Acetaminophen Itching and Other (See Comments) 11/24/2013   Other Hives 07/03/2020   Dilaudid [hydromorphone hcl] Itching 01/20/2022   Lactose Other (See Comments) 10/04/2022    Medications:  Current Outpatient Medications:    albuterol (VENTOLIN HFA) 108 (90 Base) MCG/ACT inhaler, 1-2 puffs every 4-6 hours as needed for cough or wheezing. Rinse mouth after use, Disp: 1 each, Rfl: 1   atorvastatin (LIPITOR) 40 MG tablet, Take 1 tablet (40 mg total) by mouth daily., Disp: 90 tablet,  Rfl: 0   EPINEPHrine 0.3 mg/0.3 mL IJ SOAJ injection, Inject 0.3 mg into the muscle as needed for anaphylaxis., Disp: , Rfl:    fluticasone (FLONASE) 50 MCG/ACT nasal spray, Place 2 sprays into both nostrils daily., Disp: , Rfl:    ibuprofen (ADVIL) 800 MG tablet, Take 1 tablet (800 mg total) by mouth every 8 (eight) hours as needed., Disp: 30 tablet, Rfl: 0   methocarbamol (ROBAXIN) 500 MG tablet, Take 1 tablet (500 mg total) by mouth every 6 (six)  hours as needed for muscle spasms., Disp: 40 tablet, Rfl: 0   methotrexate (RHEUMATREX) 2.5 MG tablet, Take 5 tablets by mouth once a week., Disp: , Rfl:    minoxidil (LONITEN) 2.5 MG tablet, Take 2.5 mg by mouth daily., Disp: , Rfl:    Multiple Vitamin (MULTI-VITAMIN) tablet, Take 1 tablet by mouth daily., Disp: , Rfl:    naproxen sodium (ALEVE) 220 MG tablet, Take by mouth at bedtime., Disp: , Rfl:    pregabalin (LYRICA) 25 MG capsule, Take 50 mg by mouth 2 (two) times daily., Disp: , Rfl:    spironolactone (ALDACTONE) 100 MG tablet, Take 100 mg by mouth 2 (two) times daily., Disp: , Rfl:    tirzepatide (MOUNJARO) 5 MG/0.5ML Pen, Inject 5 mg into the skin once a week., Disp: , Rfl:    traMADol (ULTRAM) 50 MG tablet, Take 50 mg by mouth as needed., Disp: , Rfl:    venlafaxine (EFFEXOR) 75 MG tablet, Take 75 mg by mouth daily., Disp: , Rfl:    Vitamin D, Ergocalciferol, (DRISDOL) 1.25 MG (50000 UNIT) CAPS capsule, Take 50,000 Units by mouth every 7 (seven) days., Disp: , Rfl:   Social History: Social History   Tobacco Use   Smoking status: Never   Smokeless tobacco: Never  Vaping Use   Vaping status: Never Used  Substance Use Topics   Alcohol use: Yes    Alcohol/week: 0.0 - 1.0 standard drinks of alcohol    Comment: rarely   Drug use: Not Currently    Family Medical History: No family history on file.  Physical Examination: Vitals:   11/20/23 1331  BP: 124/78    General: Patient is in no apparent distress. Attention to examination is appropriate.  Neck:   Supple.  Full range of motion.  Respiratory: Patient is breathing without any difficulty.   NEUROLOGICAL:     Awake, alert, oriented to person, place, and time.  Speech is clear and fluent.   Cranial Nerves: Pupils equal round and reactive to light.  Facial tone is symmetric.  Facial sensation is symmetric. Shoulder shrug is symmetric. Tongue protrusion is midline.    Strength: Side Biceps Triceps Deltoid Interossei  Grip Wrist Ext. Wrist Flex.  R 5 5 5 5 5 5 5   L 5 5 5 5 5 5 5    Side Iliopsoas Quads Hamstring PF DF EHL  R 5 5 5 5 5 5   L 5 5 5 5 5 5    Bilateral upper and lower extremity sensation is intact to light touch.    No evidence of dysmetria noted.  Gait is antalgic.     Medical Decision Making  Imaging: CT Head 11/05/2023 IMPRESSION: 1. No acute intracranial CT findings. 2. 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     Electronically Signed   By: Almira Bar M.D.   On: 11/05/2023 02:08  MRI L spine 02/04/2023 IMPRESSION: 1. T3-T4 mild spinal canal stenosis and mild left neural foraminal narrowing. 2.  L5-S1 disc protrusion has decreased in size but appears to contact the descending left S1 nerve roots. Narrowing of the lateral recesses at this level could also affect the descending S1 nerve roots. 3. L4-L5 mild-to-moderate facet arthropathy. Narrowing of the lateral recesses at this level could affect the descending L5 nerve roots.     Electronically Signed   By: Wiliam Ke M.D.   On: 02/09/2023 18:16  I have personally reviewed the images and agree with the above interpretation.  Assessment and Plan: Ms. 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  I will order an MRI scan for further characterization.  If it appears to be a standard pineal region cyst, we will repeat her scan in approximately 1 year.  For her neck symptoms, she has been having worsening symptoms over the past 6 weeks and has been doing physical therapy.  It is time to get an MRI scan of her neck.  For her lower back, she has had ongoing low back pain with bilateral sciatica for 2 decades or so.  She intermittently has worsened symptoms.  On the CT scan of her abdomen from 2023, she has substantial facet arthrosis at L4-5.  She also has lateral recess stenosis at L4-5 as well as on the left side at S1.  I have recommended flexion-extension radiographs of her lower back.  This  will help determine whether surgical intervention is an option for her.  I do not think there is further conservative management indicated for her lower back.  I spent a total of 30 minutes in this patient's care today. This time was spent reviewing pertinent records including imaging studies, obtaining and confirming history, performing a directed evaluation, formulating and discussing my recommendations, and documenting the visit within the medical record.      Thank you for involving me in the care of this patient.      Burnette Sautter K. Myer Haff MD, [facility]

## 2023-11-20 ENCOUNTER — Ambulatory Visit: Payer: BC Managed Care – PPO | Admitting: Neurosurgery

## 2023-11-20 VITALS — BP 124/78 | Ht 68.0 in | Wt 229.0 lb

## 2023-11-20 DIAGNOSIS — M5442 Lumbago with sciatica, left side: Secondary | ICD-10-CM | POA: Diagnosis not present

## 2023-11-20 DIAGNOSIS — M48061 Spinal stenosis, lumbar region without neurogenic claudication: Secondary | ICD-10-CM

## 2023-11-20 DIAGNOSIS — M5412 Radiculopathy, cervical region: Secondary | ICD-10-CM

## 2023-11-20 DIAGNOSIS — M5441 Lumbago with sciatica, right side: Secondary | ICD-10-CM

## 2023-11-20 DIAGNOSIS — M5416 Radiculopathy, lumbar region: Secondary | ICD-10-CM

## 2023-11-20 DIAGNOSIS — E348 Other specified endocrine disorders: Secondary | ICD-10-CM

## 2023-11-20 DIAGNOSIS — G8929 Other chronic pain: Secondary | ICD-10-CM

## 2023-11-20 MED ORDER — METHOCARBAMOL 500 MG PO TABS: 500.0000 mg | ORAL_TABLET | Freq: Four times a day (QID) | ORAL | 0 refills | Status: DC | PRN

## 2023-12-11 ENCOUNTER — Ambulatory Visit: Admitting: Adult Health

## 2023-12-23 ENCOUNTER — Other Ambulatory Visit: Payer: Self-pay

## 2023-12-23 ENCOUNTER — Encounter: Payer: Self-pay | Admitting: Oncology

## 2023-12-23 ENCOUNTER — Ambulatory Visit (INDEPENDENT_AMBULATORY_CARE_PROVIDER_SITE_OTHER): Payer: Self-pay | Admitting: Oncology

## 2023-12-23 VITALS — BP 150/89 | HR 102 | Temp 97.6°F | Ht 68.0 in | Wt 221.0 lb

## 2023-12-23 DIAGNOSIS — R1031 Right lower quadrant pain: Secondary | ICD-10-CM

## 2023-12-23 MED ORDER — GABAPENTIN 300 MG PO CAPS: 300.0000 mg | ORAL_CAPSULE | Freq: Three times a day (TID) | ORAL | 3 refills | Status: DC

## 2023-12-23 MED ORDER — ONDANSETRON 4 MG PO TBDP: 4.0000 mg | ORAL_TABLET | Freq: Three times a day (TID) | ORAL | 0 refills | Status: DC | PRN

## 2023-12-23 NOTE — Progress Notes (Signed)
 [facility]  [address] [place], [place] [address] Phone: [phone] Fax: [phone]   Office Visit Note  Patient Name: Eileen Chaney  Date of [id number]  Med Rec number [id number]  Date of Service: 12/23/2023  Acetaminophen, Other, Dilaudid [hydromorphone hcl], and Lactose  Chief Complaint  Patient presents with   Acute Visit    Pain in right side x 2 weeks. No rash present. Hx of shingles on same side. Eileen Chaney is currently taking lysine, valacyclovir, and an OTC cream to help with body pain.     HPI Patient is an 48 y.o. patient with past medical history of hypertension, diabetes, hyperlipidemia, fibromyalgia who presents today to clinic for right upper abdominal pain with associated nausea that radiates to her back.  Reports Eileen Chaney has had shingles without rash twice over the past year and was prescribed valacyclovir 1000 mg twice daily.  Typically symptoms would resolve within 1 week.  Reports onset of her symptoms were approximately 2 weeks ago and Eileen Chaney has been on the antiviral for about a week without any significant improvement.  Eileen Chaney has not seen a rash.  Denies any injury.  Does feel like her organs hurt on her right side.  Eileen Chaney has never had any imaging.  Eileen Chaney has been taking Aleve, BC powder and using a pain cream that is helped some.  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  Eileen Chaney takes Zyrtec twice daily, Pepcid, montelukast and Benadryl as needed.  Eileen Chaney was recently on prednisone and takes methotrexate daily.  Current Medication:  Outpatient Encounter Medications as of 12/23/2023  Medication Sig   albuterol (VENTOLIN HFA) 108 (90 Base) MCG/ACT inhaler 1-2 puffs every 4-6 hours as needed for cough or wheezing. Rinse mouth after use   atorvastatin (LIPITOR) 40 MG tablet Take 1 tablet (40 mg total) by mouth daily.   desvenlafaxine (PRISTIQ) 50 MG 24 hr  tablet Take 50 mg by mouth daily.   DULoxetine (CYMBALTA) 30 MG capsule Take 30 mg by mouth daily.   EPINEPHrine 0.3 mg/0.3 mL IJ SOAJ injection Inject 0.3 mg into the muscle as needed for anaphylaxis.   fluticasone (FLONASE) 50 MCG/ACT nasal spray Place 2 sprays into both nostrils daily.   gabapentin (NEURONTIN) 300 MG capsule Take 1 capsule (300 mg total) by mouth 3 (three) times daily.   ibuprofen (ADVIL) 800 MG tablet Take 1 tablet (800 mg total) by mouth every 8 (eight) hours as needed.   LYSINE PO Take 1,000 mg by mouth 2 (two) times daily.   methocarbamol (ROBAXIN) 500 MG tablet Take 1 tablet (500 mg total) by mouth every 6 (six) hours as needed for muscle spasms.   methotrexate (RHEUMATREX) 2.5 MG tablet Take 5 tablets by mouth once a week.   minoxidil (LONITEN) 2.5 MG tablet Take 2.5 mg by mouth daily.   Multiple Vitamin (MULTI-VITAMIN) tablet Take 1 tablet by mouth daily.   naproxen sodium (ALEVE) 220 MG tablet Take 220 mg by mouth daily as needed.   ondansetron (ZOFRAN-ODT) 4 MG disintegrating tablet Take 1 tablet (4 mg total) by mouth every 8 (eight) hours as needed for nausea or vomiting.   pregabalin (LYRICA) 25 MG capsule Take 50 mg by mouth 2 (two) times daily.   spironolactone (ALDACTONE) 100 MG tablet Take 100 mg by mouth 2 (two) times daily.   tirzepatide [facility]) 5 MG/0.5ML Pen Inject 5 mg into  the skin once a week.   valACYclovir HCl (VALTREX PO) Take 1 g by mouth 2 (two) times daily.   Vitamin D, Ergocalciferol, (DRISDOL) 1.25 MG (50000 UNIT) CAPS capsule Take 50,000 Units by mouth every 7 (seven) days.   [DISCONTINUED] naproxen sodium (ALEVE) 220 MG tablet Take by mouth at bedtime.   [DISCONTINUED] traMADol (ULTRAM) 50 MG tablet Take 50 mg by mouth as needed.   [DISCONTINUED] venlafaxine (EFFEXOR) 75 MG tablet Take 75 mg by mouth daily.   No facility-administered encounter medications on file as of 12/23/2023.      Medical History: Past Medical History:  Diagnosis  Date   Anxiety    Arthritis    Asthma    Cervical myelopathy with cervical radiculopathy (HCC)    Complication of anesthesia    DDD (degenerative disc disease), cervical    Depression    Diabetes mellitus without complication (HCC)    type 2   Family history of adverse reaction to anesthesia    mother hard to wake up   Fibromyalgia    Hyperlipidemia    Hypertension    Idiopathic urticaria    Pneumonia    PONV (postoperative nausea and vomiting)    Undifferentiated inflammatory arthritis (HCC)      Vital Signs: BP (!) 150/89   Pulse (!) 102   Temp 97.6 F (36.4 C)   Ht 5\' 8"  (1.727 m)   Wt 221 lb (100.2 kg)   SpO2 98%   BMI 33.60 kg/m   ROS: As per HPI.  All other pertinent ROS negative.     Review of Systems  Skin:  Negative for rash.  Neurological:  Positive for numbness (To right side).    Physical Exam Constitutional:      Appearance: Normal appearance.  Skin:    Findings: No abscess, bruising, ecchymosis, signs of injury or rash. Rash is not crusting or urticarial.          Comments: No rash or palpable abnormality.  Sensitivity and tenderness to right sided abdomen from right breast to navel that expands all the way around to back.   Neurological:     Mental Status: Eileen Chaney is alert.     No results found for this or any previous visit (from the past 24 hours).  Assessment/Plan: 1. 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 Eileen Chaney has been on valacyclovir 1 g twice daily for the past 7 days. Reports symptoms are not improving.  We discussed increasing valacyclovir 1 g to 3 times daily and adding gabapentin 300 mg as needed for neuropathic pain. Recommend labs today (CBC, CMP and C-reactive protein).  Will send results via MyChart when available. Reports worsening internal pain than previous and Eileen Chaney has concerns of something else potentially going on.  Is interested in  imaging.  We discussed an ultrasound of her abdomen. Possible referral to infectious disease based on labs and ultrasound. Continue topical pain reliever, Aleve and BC powder as needed.  Eileen Chaney was given 8 mg Zofran while in clinic.  New prescription sent to her pharmacy.  Recommend 4 mg every 6 hours as needed for nausea.  - naproxen sodium (ALEVE) 220 MG tablet; Take 220 mg by mouth daily as needed. - valACYclovir HCl (VALTREX PO); Take 1 g by mouth 2 (two) times daily. - gabapentin (NEURONTIN) 300 MG capsule; Take 1 capsule (300 mg total) by mouth 3 (three) times daily.  Dispense:  90 capsule; Refill: 3 - ondansetron (ZOFRAN-ODT) 4 MG disintegrating tablet; Take 1 tablet (4 mg total) by mouth every 8 (eight) hours as needed for nausea or vomiting.  Dispense: 30 tablet; Refill: 0 - CBC with Differential/Platelet - Comprehensive metabolic panel - C-reactive protein - US Abdomen Complete; Future   General Counseling: Jaylea verbalizes understanding of the findings of todays visit and agrees with plan of treatment. I have discussed any further diagnostic evaluation that may be needed or ordered today. We also reviewed her medications today. Eileen Chaney has been encouraged to call the office with any questions or concerns that should arise related to todays visit.   Orders Placed This Encounter  Procedures   US Abdomen Complete   CBC with Differential/Platelet   Comprehensive metabolic panel   C-reactive protein    Meds ordered this encounter  Medications   gabapentin (NEURONTIN) 300 MG capsule    Sig: Take 1 capsule (300 mg total) by mouth 3 (three) times daily.    Dispense:  90 capsule    Refill:  3   ondansetron (ZOFRAN-ODT) 4 MG disintegrating tablet    Sig: Take 1 tablet (4 mg total) by mouth every 8 (eight) hours as needed for nausea or vomiting.    Dispense:  30 tablet    Refill:  0    I spent 25 minutes dedicated to the care of this patient (face-to-face and non-face-to-face) on the  date of the encounter to include what is described in the assessment and plan.   Durenda Hurt, NP 12/23/2023 3:09 PM

## 2023-12-24 ENCOUNTER — Ambulatory Visit

## 2023-12-24 ENCOUNTER — Encounter: Payer: Self-pay | Admitting: Oncology

## 2023-12-24 LAB — COMPREHENSIVE METABOLIC PANEL
ALT: 11 IU/L (ref 0–32)
AST: 16 IU/L (ref 0–40)
Albumin: 4.8 g/dL (ref 3.9–4.9)
Alkaline Phosphatase: 131 IU/L — ABNORMAL HIGH (ref 44–121)
BUN/Creatinine Ratio: 15 (ref 9–23)
BUN: 13 mg/dL (ref 6–24)
Bilirubin Total: 0.5 mg/dL (ref 0.0–1.2)
CO2: 23 mmol/L (ref 20–29)
Calcium: 10.1 mg/dL (ref 8.7–10.2)
Chloride: 99 mmol/L (ref 96–106)
Creatinine, Ser: 0.88 mg/dL (ref 0.57–1.00)
Globulin, Total: 2.9 g/dL (ref 1.5–4.5)
Glucose: 94 mg/dL (ref 70–99)
Potassium: 4.5 mmol/L (ref 3.5–5.2)
Sodium: 139 mmol/L (ref 134–144)
Total Protein: 7.7 g/dL (ref 6.0–8.5)
eGFR: 81 mL/min/{1.73_m2} (ref >59)

## 2023-12-24 LAB — CBC WITH DIFFERENTIAL/PLATELET
Basophils Absolute: 0 10*3/uL (ref 0.0–0.2)
Basos: 0 %
EOS (ABSOLUTE): 0.1 10*3/uL (ref 0.0–0.4)
Eos: 1 %
Hematocrit: 40 % (ref 34.0–46.6)
Hemoglobin: 13.1 g/dL (ref 11.1–15.9)
Immature Grans (Abs): 0 10*3/uL (ref 0.0–0.1)
Immature Granulocytes: 0 %
Lymphocytes Absolute: 3.1 10*3/uL (ref 0.7–3.1)
Lymphs: 32 %
MCH: 27.9 pg (ref 26.6–33.0)
MCHC: 32.8 g/dL (ref 31.5–35.7)
MCV: 85 fL (ref 79–97)
Monocytes Absolute: 0.5 10*3/uL (ref 0.1–0.9)
Monocytes: 6 %
Neutrophils Absolute: 5.9 10*3/uL (ref 1.4–7.0)
Neutrophils: 61 %
Platelets: 357 10*3/uL (ref 150–450)
RBC: 4.69 x10E6/uL (ref 3.77–5.28)
RDW: 14.9 % (ref 11.7–15.4)
WBC: 9.7 10*3/uL (ref 3.4–10.8)

## 2023-12-24 LAB — C-REACTIVE PROTEIN: CRP: 6 mg/L (ref 0–10)

## 2024-01-02 ENCOUNTER — Inpatient Hospital Stay
Admission: RE | Admit: 2024-01-02 | Discharge: 2024-01-02 | Disposition: A | Discharge status: Home / Self Care | Payer: Self-pay | Source: Ambulatory Visit | Attending: Neurosurgery | Admitting: Neurosurgery

## 2024-01-02 ENCOUNTER — Other Ambulatory Visit: Payer: Self-pay | Admitting: Family Medicine

## 2024-01-02 DIAGNOSIS — Z049 Encounter for examination and observation for unspecified reason: Secondary | ICD-10-CM

## 2024-01-05 ENCOUNTER — Other Ambulatory Visit: Payer: Self-pay | Admitting: Family Medicine

## 2024-01-05 ENCOUNTER — Inpatient Hospital Stay
Admission: RE | Admit: 2024-01-05 | Discharge: 2024-01-05 | Disposition: A | Discharge status: Home / Self Care | Payer: Self-pay | Source: Ambulatory Visit | Attending: Neurosurgery | Admitting: Neurosurgery

## 2024-01-05 DIAGNOSIS — Z049 Encounter for examination and observation for unspecified reason: Secondary | ICD-10-CM

## 2024-01-08 ENCOUNTER — Encounter: Payer: Self-pay | Admitting: Neurosurgery

## 2024-01-13 ENCOUNTER — Ambulatory Visit: Admitting: Neurosurgery

## 2024-01-13 VITALS — BP 126/80 | Ht 68.0 in | Wt 221.0 lb

## 2024-01-13 DIAGNOSIS — M542 Cervicalgia: Secondary | ICD-10-CM

## 2024-01-13 DIAGNOSIS — M5412 Radiculopathy, cervical region: Secondary | ICD-10-CM

## 2024-01-13 DIAGNOSIS — G959 Disease of spinal cord, unspecified: Secondary | ICD-10-CM

## 2024-01-13 NOTE — Progress Notes (Signed)
 Referring Physician:  Mick Sell, MD [address] [place],  [place] [address]  Primary Physician:  Mick Sell, MD  DOS:  11/12/21 (ACDF C3-5) 01/07/2022 (C5-7 posterior decompression 01/21/2024 (I and D posterior wound)  History of Present Illness: 01/13/2024 [name] continues to have both neck, left shoulder, and lower back pain that extends into her buttocks.  She has tried multiple episodes of physical therapy.  More recently, she reports some intermittent numbness in her left hand as well as some increasing difficulty with her balance.  This is greatly concerning to her.  She has not had any falls, but has noted a difference in her balance over time.  She has also noted some changes in her dexterity such as difficulty with buttons and clasping her necklaces.   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  She underwent neck surgery approximately 2 years ago.  She has developed some symptoms in the same distribution as she had previously.  She has pain into her neck and into her left upper arm.  In her low back, she has had pain on and off for more than 20 years.  She is done physical therapy many times.  She is currently doing physical therapy.  She has pain into her buttocks on the left bilaterally.  Activities make her pain worse.  Nothing really helps significantly except BC powder.  She is currently doing aquatic and regular physical therapy.   Past Surgery:  Cervical fusion on 11/12/2021 Posterior cervical laminectomty on 01/07/2022  Eileen Chaney has no symptoms of cervical myelopathy.  The symptoms are causing a significant impact on the patient's life.   I have utilized the care everywhere function in epic to review the outside records available from external health systems.  Review of Systems:  A 10 point review  of systems is negative, except for the pertinent positives and negatives detailed in the HPI.  Past Medical History: Past Medical History:  Diagnosis Date   Anxiety    Arthritis    Asthma    Cervical myelopathy with cervical radiculopathy (HCC)    Complication of anesthesia    DDD (degenerative disc disease), cervical    Depression    Diabetes mellitus without complication (HCC)    type 2   Family history of adverse reaction to anesthesia    mother hard to wake up   Fibromyalgia    Hyperlipidemia    Hypertension    Idiopathic urticaria    Pneumonia    PONV (postoperative nausea and vomiting)    Undifferentiated inflammatory arthritis (HCC)     Past Surgical History: Past Surgical History:  Procedure Laterality Date   ABDOMINAL HYSTERECTOMY  2018   uterine fibroids   ANKLE SURGERY Right 2022   ligament repair   ANTERIOR CERVICAL DECOMP/DISCECTOMY FUSION N/A 11/12/2021   Procedure: C3-5 ANTERIOR CERVICAL DISCECTOMY AND FUSION (GLOBUS HEDRON);  Surgeon: Venetia Night, MD;  Location: [facility] ORS;  Service: Neurosurgery;  Laterality: N/A;   CERVICAL WOUND DEBRIDEMENT N/A 01/20/2022   Procedure: posterior wound debridment;  Surgeon: Venetia Night, MD;  Location: [facility] ORS;  Service: Neurosurgery;  Laterality: N/A;   CESAREAN SECTION     X3 2000, 2001, 2009   POSTERIOR CERVICAL LAMINECTOMY N/A 01/07/2022   Procedure: OPEN C5-7 POSTERIOR DECOMPRESSION;  Surgeon: Venetia Night, MD;  Location: [facility] ORS;  Service: Neurosurgery;  Laterality: N/A;   TONSILLECTOMY  2000    Allergies: Allergies as of 01/13/2024 - Review Complete 01/13/2024  Allergen Reaction Noted   Acetaminophen Itching and Other (See Comments) 11/24/2013   Other Hives 07/03/2020   Dilaudid [hydromorphone hcl] Itching 01/20/2022   Lactose Other (See Comments) 10/04/2022    Medications:  Current Outpatient Medications:    atorvastatin (LIPITOR) 40 MG tablet, Take 1 tablet (40 mg total) by mouth  daily., Disp: 90 tablet, Rfl: 0   desvenlafaxine (PRISTIQ) 50 MG 24 hr tablet, Take 50 mg by mouth daily., Disp: , Rfl:    DULoxetine (CYMBALTA) 30 MG capsule, Take 30 mg by mouth daily., Disp: , Rfl:    EPINEPHrine 0.3 mg/0.3 mL IJ SOAJ injection, Inject 0.3 mg into the muscle as needed for anaphylaxis., Disp: , Rfl:    fluticasone (FLONASE) 50 MCG/ACT nasal spray, Place 2 sprays into both nostrils daily., Disp: , Rfl:    gabapentin (NEURONTIN) 300 MG capsule, Take 1 capsule (300 mg total) by mouth 3 (three) times daily., Disp: 90 capsule, Rfl: 3   LYSINE PO, Take 1,000 mg by mouth 2 (two) times daily., Disp: , Rfl:    methocarbamol (ROBAXIN) 500 MG tablet, Take 1 tablet (500 mg total) by mouth every 6 (six) hours as needed for muscle spasms., Disp: 40 tablet, Rfl: 0   minoxidil (LONITEN) 2.5 MG tablet, Take 2.5 mg by mouth daily., Disp: , Rfl:    Multiple Vitamin (MULTI-VITAMIN) tablet, Take 1 tablet by mouth daily., Disp: , Rfl:    naproxen sodium (ALEVE) 220 MG tablet, Take 220 mg by mouth daily as needed., Disp: , Rfl:    pregabalin (LYRICA) 25 MG capsule, Take 50 mg by mouth 2 (two) times daily., Disp: , Rfl:    spironolactone (ALDACTONE) 100 MG tablet, Take 100 mg by mouth 2 (two) times daily., Disp: , Rfl:    tirzepatide (MOUNJARO) 15 MG/0.5ML Pen, Inject 5 mg into the skin once a week., Disp: , Rfl:    Vitamin D, Ergocalciferol, (DRISDOL) 1.25 MG (50000 UNIT) CAPS capsule, Take 50,000 Units by mouth every 7 (seven) days., Disp: , Rfl:   Social History: Social History   Tobacco Use   Smoking status: Never   Smokeless tobacco: Never  Vaping Use   Vaping status: Never Used  Substance Use Topics   Alcohol use: Yes    Alcohol/week: 0.0 - 1.0 standard drinks of alcohol    Comment: rarely   Drug use: Not Currently    Family Medical History: No family history on file.  Physical Examination: Vitals:   01/13/24 0839  BP: 126/80     General: Patient is in no apparent distress.  Attention to examination is appropriate. No additional exam today  Medical Decision Making  Imaging: CT Head 11/05/2023 IMPRESSION: 1. No acute intracranial CT findings. 2. 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     Electronically Signed   By: Denman Fischer M.D.   On: 11/05/2023 02:08  MRI L spine 02/04/2023 IMPRESSION: 1. T3-T4 mild spinal canal stenosis and mild left neural foraminal narrowing. 2. L5-S1 disc protrusion has decreased in size but appears to contact the descending left S1 nerve roots. Narrowing of the lateral recesses at this level could also affect the descending S1 nerve roots. 3. L4-L5 mild-to-moderate facet arthropathy. Narrowing of the lateral recesses at this level could affect the descending L5 nerve roots.     Electronically Signed   By: Zoila Hines M.D.   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   Otherwise normal.   No follow-up recommended.     Electronically Signed by: Opal Bill, MD on 01/04/2024 1:22 PM   L spine flex/ext 12/30/2023 IMPRESSION:  1. No acute radiographic findings.  2.  Multilevel degenerative changes.   Electronically Signed by: Anette Kehr, MD on 01/01/2024 9:33 AM   MRI C spine 12/30/2023 IMPRESSION:    1. Prior ACDF at C3-4 and C4-5 with solid arthrodesis  2. At C4-5 there is ossification of the disc level which effaces the thecal sac and slightly indents the cord.  3. At C5-6 there is degenerative disc disease with disc bulge and foraminal spurs causing moderate spinal stenosis and severe bilateral neuroforaminal stenosis.  4. At C6-7 there is a disc bulge with bilateral disc/foraminal spurs and moderate bilateral neuroforaminal stenosis     Electronically Signed by: Opal Bill, MD on 01/04/2024 1:15 PM  I have personally reviewed the images and agree with the above interpretation.  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  That does not require further follow-up.  For her neck, it seems that she may be suffering from cervical myelopathy.  Additionally, I am concerned that she may not be fused at C3-4.  I would like to get a CT scan to help in surgical decision-making as her worsening symptoms may indicate the need for further surgical intervention to decompress her spinal cord.  This would likely involve a posterior approach but the levels will be determined by the findings of the CT scan.  For her low back, she has an anterolisthesis at L4-5 that is stable currently.  I measured this at 3 mm.  I suspect will worsen over time and ultimately meet indications for surgical intervention at some point, but not currently.  We discussed spinal cord stimulation, but that is not currently indicated unless she has had lower back surgical intervention.  Additionally, 1 could consider L4-5 and L5-S1 decompression, but that would not address her back pain.  As such, we are currently in a holding pattern for her lower back.    We will reconvene once she has had her CT scan.   Thank you for involving me in the care of this patient.      Karrie Fluellen K. Mont Antis MD, [facility]

## 2024-02-05 ENCOUNTER — Other Ambulatory Visit: Payer: Self-pay | Admitting: Family Medicine

## 2024-02-05 ENCOUNTER — Inpatient Hospital Stay
Admission: RE | Admit: 2024-02-05 | Discharge: 2024-02-05 | Disposition: A | Discharge status: Home / Self Care | Payer: Self-pay | Source: Ambulatory Visit | Attending: Neurosurgery | Admitting: Neurosurgery

## 2024-02-05 DIAGNOSIS — Z049 Encounter for examination and observation for unspecified reason: Secondary | ICD-10-CM

## 2024-02-17 ENCOUNTER — Encounter: Payer: Self-pay | Admitting: Neurosurgery

## 2024-02-17 ENCOUNTER — Ambulatory Visit: Admitting: Neurosurgery

## 2024-02-17 ENCOUNTER — Other Ambulatory Visit: Payer: Self-pay

## 2024-02-17 VITALS — BP 130/80 | Ht 68.0 in | Wt 229.0 lb

## 2024-02-17 DIAGNOSIS — G959 Disease of spinal cord, unspecified: Secondary | ICD-10-CM

## 2024-02-17 DIAGNOSIS — Z01818 Encounter for other preprocedural examination: Secondary | ICD-10-CM

## 2024-02-17 DIAGNOSIS — M5412 Radiculopathy, cervical region: Secondary | ICD-10-CM

## 2024-02-17 DIAGNOSIS — M4802 Spinal stenosis, cervical region: Secondary | ICD-10-CM | POA: Diagnosis not present

## 2024-02-17 NOTE — Progress Notes (Signed)
 Referring Physician:  Eartha Gold, MD [address] [place],  [place] [address]  Primary Physician:  Eartha Gold, MD  DOS:  11/12/21 (ACDF C3-5) 01/07/2022 (C5-7 posterior decompression 01/21/2024 (I and D posterior wound)  History of Present Illness: 02/17/2024 Since her last visit, Eileen Chaney has had increasing difficulty with dexterity in her hands.  She is having trouble using her phone.  She feels like her numbness and fine motor control has gotten worse.  She continues to have life limiting neck and arm pain.   01/13/2024 Eileen Chaney continues to have both neck, left shoulder, and lower back pain that extends into her buttocks.  She has tried multiple episodes of physical therapy.  More recently, she reports some intermittent numbness in her left hand as well as some increasing difficulty with her balance.  This is greatly concerning to her.  She has not had any falls, but has noted a difference in her balance over time.  She has also noted some changes in her dexterity such as difficulty with buttons and clasping her necklaces.   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  She underwent neck surgery approximately 2 years ago.  She has developed some symptoms in the same distribution as she had previously.  She has pain into her neck and into her left upper arm.  In her low back, she has had pain on and off for more than 20 years.  She is done physical therapy many times.  She is currently doing physical therapy.  She has pain into her buttocks on the left bilaterally.  Activities make her pain worse.  Nothing really helps significantly except BC powder.  She is currently doing aquatic and regular physical therapy.   Past Surgery:  Cervical fusion on 11/12/2021 Posterior cervical laminectomty on 01/07/2022  Eileen Chaney has no  symptoms of cervical myelopathy.  The symptoms are causing a significant impact on the patient's life.   I have utilized the care everywhere function in epic to review the outside records available from external health systems.  Review of Systems:  A 10 point review of systems is negative, except for the pertinent positives and negatives detailed in the HPI.  Past Medical History: Past Medical History:  Diagnosis Date   Anxiety    Arthritis    Asthma    Cervical myelopathy with cervical radiculopathy (HCC)    Complication of anesthesia    DDD (degenerative disc disease), cervical    Depression    Diabetes mellitus without complication (HCC)    type 2   Family history of adverse reaction to anesthesia    mother hard to wake up   Fibromyalgia    Hyperlipidemia    Hypertension    Idiopathic urticaria    Pneumonia    PONV (postoperative nausea and vomiting)    Undifferentiated inflammatory arthritis     Past Surgical History: Past Surgical History:  Procedure Laterality Date   ABDOMINAL HYSTERECTOMY  2018   uterine fibroids   ANKLE SURGERY Right 2022   ligament repair   ANTERIOR CERVICAL DECOMP/DISCECTOMY FUSION N/A 11/12/2021   Procedure: C3-5 ANTERIOR CERVICAL DISCECTOMY AND FUSION (GLOBUS HEDRON);  Surgeon: Jodeen Munch, MD;  Location: [facility] ORS;  Service: Neurosurgery;  Laterality: N/A;   CERVICAL WOUND DEBRIDEMENT N/A 01/20/2022   Procedure: posterior wound debridment;  Surgeon: Jodeen Munch,  MD;  Location: [facility] ORS;  Service: Neurosurgery;  Laterality: N/A;   CESAREAN SECTION     X3 2000, 2001, 2009   POSTERIOR CERVICAL LAMINECTOMY N/A 01/07/2022   Procedure: OPEN C5-7 POSTERIOR DECOMPRESSION;  Surgeon: Jodeen Munch, MD;  Location: [facility] ORS;  Service: Neurosurgery;  Laterality: N/A;   TONSILLECTOMY  2000    Allergies: Allergies as of 02/17/2024 - Review Complete 02/17/2024  Allergen Reaction Noted   Acetaminophen  Itching and Other (See Comments)  11/24/2013   Other Hives 07/03/2020   Dilaudid  [hydromorphone  hcl] Itching 01/20/2022   Lactose Other (See Comments) 10/04/2022    Medications:  Current Outpatient Medications:    Aspirin-Salicylamide-Caffeine (BC HEADACHE POWDER PO), Take by mouth as needed., Disp: , Rfl:    atorvastatin  (LIPITOR) 40 MG tablet, Take 1 tablet (40 mg total) by mouth daily., Disp: 90 tablet, Rfl: 0   desvenlafaxine (PRISTIQ) 50 MG 24 hr tablet, Take 50 mg by mouth daily., Disp: , Rfl:    EPINEPHrine  0.3 mg/0.3 mL IJ SOAJ injection, Inject 0.3 mg into the muscle as needed for anaphylaxis., Disp: , Rfl:    famotidine  (PEPCID ) 40 MG tablet, Take 40 mg by mouth daily. Twice daily, Disp: , Rfl:    fluticasone  (FLONASE ) 50 MCG/ACT nasal spray, Place 2 sprays into both nostrils daily., Disp: , Rfl:    gabapentin  (NEURONTIN ) 300 MG capsule, Take 1 capsule (300 mg total) by mouth 3 (three) times daily., Disp: 90 capsule, Rfl: 3   hydrOXYzine  (VISTARIL ) 50 MG capsule, Take 50 mg by mouth 3 (three) times daily as needed for itching., Disp: , Rfl:    methylphenidate (METADATE CD) 20 MG CR capsule, Take 20 mg by mouth every morning., Disp: , Rfl:    minoxidil (LONITEN) 2.5 MG tablet, Take 2.5 mg by mouth daily., Disp: , Rfl:    montelukast  (SINGULAIR ) 10 MG tablet, Take 10 mg by mouth at bedtime., Disp: , Rfl:    Multiple Vitamin (MULTI-VITAMIN) tablet, Take 1 tablet by mouth daily., Disp: , Rfl:    Naltrexone HCl, Pain, 4.5 MG CAPS, Take 4.5 mg by mouth daily., Disp: , Rfl:    naproxen sodium (ALEVE) 220 MG tablet, Take 220 mg by mouth daily as needed., Disp: , Rfl:    ORPHENADRINE CITRATE PO, Take 100 mg by mouth. PRN, Disp: , Rfl:    pregabalin (LYRICA) 25 MG capsule, Take 50 mg by mouth 2 (two) times daily., Disp: , Rfl:    spironolactone  (ALDACTONE ) 100 MG tablet, Take 100 mg by mouth 2 (two) times daily., Disp: , Rfl:    tirzepatide (MOUNJARO) 15 MG/0.5ML Pen, Inject 5 mg into the skin once a week., Disp: , Rfl:     Vitamin D, Ergocalciferol, (DRISDOL) 1.25 MG (50000 UNIT) CAPS capsule, Take 50,000 Units by mouth every 7 (seven) days., Disp: , Rfl:    DULoxetine (CYMBALTA) 30 MG capsule, Take 30 mg by mouth daily. (Patient not taking: Reported on 02/17/2024), Disp: , Rfl:    LYSINE PO, Take 1,000 mg by mouth 2 (two) times daily. (Patient not taking: Reported on 02/17/2024), Disp: , Rfl:    methocarbamol  (ROBAXIN ) 500 MG tablet, Take 1 tablet (500 mg total) by mouth every 6 (six) hours as needed for muscle spasms., Disp: 40 tablet, Rfl: 0  Social History: Social History   Tobacco Use   Smoking status: Never   Smokeless tobacco: Never  Vaping Use   Vaping status: Never Used  Substance Use Topics   Alcohol use: Yes  Alcohol/week: 0.0 - 1.0 standard drinks of alcohol    Comment: rarely   Drug use: Not Currently    Family Medical History: History reviewed. No pertinent family history.  Physical Examination: Vitals:   02/17/24 1116  BP: 130/80      General: Patient is in no apparent distress. Attention to examination is appropriate.  MAEW   Pain down R arm in radicular fashion C6 and C7  Clinical myelopathy previously noted    Medical Decision Making  Imaging: CT Head 11/05/2023 IMPRESSION: 1. No acute intracranial CT findings. 2. 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     Electronically Signed   By: Denman Fischer M.D.   On: 11/05/2023 02:08  MRI L spine 02/04/2023 IMPRESSION: 1. T3-T4 mild spinal canal stenosis and mild left neural foraminal narrowing. 2. L5-S1 disc protrusion has decreased in size but appears to contact the descending left S1 nerve roots. Narrowing of the lateral recesses at this level could also affect the descending S1 nerve roots. 3. L4-L5 mild-to-moderate facet arthropathy. Narrowing of the lateral recesses at this level could affect the descending L5 nerve roots.     Electronically Signed   By: Zoila Hines M.D.   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   Otherwise normal.   No follow-up recommended.     Electronically Signed by: Opal Bill, MD on 01/04/2024 1:22 PM   L spine flex/ext 12/30/2023 IMPRESSION:  1. No acute radiographic findings.  2.  Multilevel degenerative changes.   Electronically Signed by: Anette Kehr, MD on 01/01/2024 9:33 AM   MRI C spine 12/30/2023 IMPRESSION:    1. Prior ACDF at C3-4 and C4-5 with solid arthrodesis  2. At C4-5 there is ossification of the disc level which effaces the thecal sac and slightly indents the cord.  3. At C5-6 there is degenerative disc disease with disc bulge and foraminal spurs causing moderate spinal stenosis and severe bilateral neuroforaminal stenosis.  4. At C6-7 there is a disc bulge with bilateral disc/foraminal spurs and moderate bilateral neuroforaminal stenosis   Electronically Signed by: Opal Bill, MD on 01/04/2024 1:15 PM   CT Cervical spine 01/29/2024 IMPRESSION:  1. Degenerative and postsurgical changes. Remote ACDF C3-4 and C4-5. There appears to be solid bony fusion anteriorly at both levels. Anterior fixating hardware in place without complicating features. 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 Prominent foraminal stenosis again noted bilaterally at C3-4, C4-5, C5-6 and on the left at C6-7.  2.  No acute bony abnormality.   Electronically Signed by: Dickie Found, MD on 02/04/2024 8:44 AM   I have personally reviewed the images and agree with the above interpretation.  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  That does not require further follow-up.  For her neck, she is suffering from cervical myelopathy.  She previously had surgery for this a couple of years ago.  She now has severe  stenosis that has recurred.  She has severe central stenosis at C4-5 and C5-6 with foraminal stenosis from C3-C7.  She has worsening symptoms of clinical myelopathy as shown by her dexterity changes, numbness and tingling in her hands, and balance changes.  At this point, no further conservative management is indicated.  She has tried multiple  rounds of physical therapy in the past.  I have recommended posterior cervical intervention with C3-7 laminectomy with bilateral foraminotomies and instrumentation from C3-7.  She is already fused at C4-5 and probably fused at C3-4, but I would recommend instrumentation of those levels to solidify the construct.  We will attempt to achieve fusion from C5-7.  I discussed the planned procedure at length with the patient, including the risks, benefits, alternatives, and indications. The risks discussed include but are not limited to bleeding, infection, need for reoperation, spinal fluid leak, stroke, vision loss, anesthetic complication, coma, paralysis, and even death. I also described in detail that improvement was not guaranteed.  The patient expressed understanding of these risks, and asked that we proceed with surgery. I described the surgery in layman's terms, and gave ample opportunity for questions, which were answered to the best of my ability.  I spent a total of 40 minutes in this patient's care today. This time was spent reviewing pertinent records including imaging studies, obtaining and confirming history, performing a directed evaluation, formulating and discussing my recommendations, and documenting the visit within the medical record.     Thank you for involving me in the care of this patient.      Arnitra Sokoloski K. Mont Antis MD, [facility] Neurosurgery

## 2024-02-17 NOTE — Patient Instructions (Signed)
 Please see below for information in regards to your upcoming surgery:   Planned surgery: C3-7 posterior spinal decompression, C3-7 posterior spinal instrumentation, C5-7 fusion   Surgery date: 03/15/24 at [facility] (Medical Mall: [address], [place], [place] [address]) - you will find out your arrival time the business day before your surgery.   Pre-op appointment at [facility] Pre-admit Testing: you will receive a call with a date/time for this appointment. If you are scheduled for an in person appointment, Pre-admit Testing is located on the first floor of the [facility] building, [address], [address]. During this appointment, they will advise you which medications you can take the morning of surgery, and which medications you will need to hold for surgery. Labs (such as blood work, EKG) may be done at your pre-op appointment. You are not required to fast for these labs. Should you need to change your pre-op appointment, please call Pre-admit testing at [phone].     Blood thinners:   BC headaches and body powder (aspirin 845mg , caffeine 65mg ): avoid for 7 days prior to surgery and 14 days after surgery   Diabetes/weight loss medications: Per anesthesia guidelines (due to the increased risk of aspiration caused by delayed gastric emptying):  Mounjaro: hold for 7 days prior to surgery    Surgical clearance: we will send a clearance form to Dr Harwood Lingo. They may wish to see you in their office prior to signing the clearance form. If so, they may call you to schedule an appointment.     Brace: You will need to bring the brace to the hospital on the day of surgery. [facility] will contact you regarding an appointment for the brace you will use after surgery. If it is getting close to your surgery date and you have not received an appointment with [facility], please reach out to us .  Their number is [phone] should you miss their  call or have an issue with your brace after surgery.     NSAIDS (Non-steroidal anti-inflammatory drugs): because you are having a fusion, please avoid taking any NSAIDS (examples: ibuprofen , motrin , aleve, naproxen, meloxicam, diclofenac) for 3 months after surgery. Celebrex  is an exception and is OK to take, if prescribed. Tylenol  is not an NSAID.    Common restrictions after surgery: No bending, lifting, or twisting ("BLT"). Avoid lifting objects heavier than 10 pounds for the first 6 weeks after surgery. Where possible, avoid household activities that involve lifting, bending, reaching, pushing, or pulling such as laundry, vacuuming, grocery shopping, and childcare. Try to arrange for help from friends and family for these activities while you heal. Do not drive while taking prescription pain medication. Weeks 6 through 12 after surgery: avoid lifting more than 25 pounds.    X-rays after surgery: Because you are having a fusion: for appointments after your 2 week follow-up: please arrive at the [facility] outpatient imaging center ([address], [place]) or [facility] one hour prior to your appointment for x-rays. This applies to every appointment after your 2 week follow-up. Failure to do so may result in your appointment being rescheduled.   How to contact us :  If you have any questions/concerns before or after surgery, you can reach us  at [phone], or you can send a mychart message. We can be reached by phone or mychart 8am-4pm, Monday-Friday.  *Please note: Calls after 4pm are forwarded to a third party answering service. Mychart messages are not routinely  monitored during evenings, weekends, and holidays. Please call our office to contact the answering service for urgent concerns during non-business hours.    If you have FMLA/disability paperwork, please drop it off or fax it to [phone], attention Patty.   Appointments/FMLA & disability  paperwork: Gerlean Kocher, & Maryann Smalls Registered Nurses/Surgery schedulers: Tristen Luce & Lauren Medical Assistants: Donnajean Fuse Physician Assistants: Ludwig Safer, PA-C, Anastacio Karvonen, PA-C & Lucetta Russel, PA-C Surgeons: Jodeen Munch, MD & Henderson Lock, MD   [facility] PREADMIT TESTING VISIT and SURGERY INFORMATION SHEET   Now that surgery has been scheduled you can anticipate several phone calls from [facility] services. A pharmacy technician will call you to verify your current list of medications taken at home.               The Pre-Service Center will call to verify your insurance information and to give you billing estimates and information.             The Preadmit Testing Office will be calling to schedule a visit to obtain information for the anesthesia team and provide instructions on preparation for surgery.  What can you expect for the Preadmit Testing Visit: Appointments may be scheduled in-person or by telephone.  If a telephone visit is scheduled, you may be asked to come into the office to have lab tests or other studies performed.   This visit will not be completed any greater than 14 days prior to your surgery.  If your surgery has been scheduled for a future date, please do not be alarmed if we have not contacted you to schedule an appointment more than a month prior to the surgery date.    Please be prepared to provide the following information during this appointment:            -Personal medical history                                               -Medication and allergy list            -Any history of problems with anesthesia              -Recent lab work or diagnostic studies            -Please notify us  of any needs we should be aware of to provide the best care possible           -You will be provided with instructions on how to prepare for your surgery.    On The Day of Surgery:  You must have a driver to take you home after surgery,  you will be asked not to drive for 24 hours following surgery.  Taxi, [employer] and non-medical transport will not be acceptable means of transportation unless you have a responsible individual who will be traveling with you.  Visitors in the surgical area:   2 people will be able to visit you in your room once your preparation for surgery has been completed. During surgery, your visitors will be asked to wait in the Surgery Waiting Area.  It is not a requirement for them to stay, if they prefer to leave and come back.  Your visitor(s) will be given an update once the surgery has been completed.  No visitors are allowed in the initial recovery room to respect  patient privacy and safety.  Once you are more awake and transfer to the secondary recovery area, or are transferred to an inpatient room, visitors will again be able to see you.  To respect and protect your privacy: We will ask on the day of surgery who your driver will be and what the contact number for that individual will be. We will ask if it is okay to share information with this individual, or if there is an alternative individual that we, or the surgeon, should contact to provide updates and information. If family or friends come to the surgical information desk requesting information about you, who you have not listed with us , no information will be given.   It may be helpful to designate someone as the main contact who will be responsible for updating your other friends and family.    PREADMIT TESTING OFFICE: [phone] SAME DAY SURGERY: [phone] We look forward to caring for you before and throughout the process of your surgery.

## 2024-02-17 NOTE — H&P (View-Only) (Signed)
 Referring Physician:  Eartha Gold, MD [address] [place],  [place] [address]  Primary Physician:  Eartha Gold, MD  DOS:  11/12/21 (ACDF C3-5) 01/07/2022 (C5-7 posterior decompression 01/21/2024 (I and D posterior wound)  History of Present Illness: 02/17/2024 Since her last visit, Eileen Chaney has had increasing difficulty with dexterity in her hands.  She is having trouble using her phone.  She feels like her numbness and fine motor control has gotten worse.  She continues to have life limiting neck and arm pain.   01/13/2024 Eileen Chaney continues to have both neck, left shoulder, and lower back pain that extends into her buttocks.  She has tried multiple episodes of physical therapy.  More recently, she reports some intermittent numbness in her left hand as well as some increasing difficulty with her balance.  This is greatly concerning to her.  She has not had any falls, but has noted a difference in her balance over time.  She has also noted some changes in her dexterity such as difficulty with buttons and clasping her necklaces.   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  She underwent neck surgery approximately 2 years ago.  She has developed some symptoms in the same distribution as she had previously.  She has pain into her neck and into her left upper arm.  In her low back, she has had pain on and off for more than 20 years.  She is done physical therapy many times.  She is currently doing physical therapy.  She has pain into her buttocks on the left bilaterally.  Activities make her pain worse.  Nothing really helps significantly except BC powder.  She is currently doing aquatic and regular physical therapy.   Past Surgery:  Cervical fusion on 11/12/2021 Posterior cervical laminectomty on 01/07/2022  Eileen Chaney has no  symptoms of cervical myelopathy.  The symptoms are causing a significant impact on the patient's life.   I have utilized the care everywhere function in epic to review the outside records available from external health systems.  Review of Systems:  A 10 point review of systems is negative, except for the pertinent positives and negatives detailed in the HPI.  Past Medical History: Past Medical History:  Diagnosis Date   Anxiety    Arthritis    Asthma    Cervical myelopathy with cervical radiculopathy (HCC)    Complication of anesthesia    DDD (degenerative disc disease), cervical    Depression    Diabetes mellitus without complication (HCC)    type 2   Family history of adverse reaction to anesthesia    mother hard to wake up   Fibromyalgia    Hyperlipidemia    Hypertension    Idiopathic urticaria    Pneumonia    PONV (postoperative nausea and vomiting)    Undifferentiated inflammatory arthritis     Past Surgical History: Past Surgical History:  Procedure Laterality Date   ABDOMINAL HYSTERECTOMY  2018   uterine fibroids   ANKLE SURGERY Right 2022   ligament repair   ANTERIOR CERVICAL DECOMP/DISCECTOMY FUSION N/A 11/12/2021   Procedure: C3-5 ANTERIOR CERVICAL DISCECTOMY AND FUSION (GLOBUS HEDRON);  Surgeon: Jodeen Munch, MD;  Location: [facility];  Service: Neurosurgery;  Laterality: N/A;   CERVICAL WOUND DEBRIDEMENT N/A 01/20/2022   Procedure: posterior wound debridment;  Surgeon: Jodeen Munch,  MD;  Location: [facility];  Service: Neurosurgery;  Laterality: N/A;   CESAREAN SECTION     X3 2000, 2001, 2009   POSTERIOR CERVICAL LAMINECTOMY N/A 01/07/2022   Procedure: OPEN C5-7 POSTERIOR DECOMPRESSION;  Surgeon: Jodeen Munch, MD;  Location: [facility];  Service: Neurosurgery;  Laterality: N/A;   TONSILLECTOMY  2000    Allergies: Allergies as of 02/17/2024 - Review Complete 02/17/2024  Allergen Reaction Noted   Acetaminophen  Itching and Other (See Comments)  11/24/2013   Other Hives 07/03/2020   Dilaudid  [hydromorphone  hcl] Itching 01/20/2022   Lactose Other (See Comments) 10/04/2022    Medications:  Current Outpatient Medications:    Aspirin-Salicylamide-Caffeine (BC HEADACHE POWDER PO), Take by mouth as needed., Disp: , Rfl:    atorvastatin  (LIPITOR) 40 MG tablet, Take 1 tablet (40 mg total) by mouth daily., Disp: 90 tablet, Rfl: 0   desvenlafaxine (PRISTIQ) 50 MG 24 hr tablet, Take 50 mg by mouth daily., Disp: , Rfl:    EPINEPHrine  0.3 mg/0.3 mL IJ SOAJ injection, Inject 0.3 mg into the muscle as needed for anaphylaxis., Disp: , Rfl:    famotidine  (PEPCID ) 40 MG tablet, Take 40 mg by mouth daily. Twice daily, Disp: , Rfl:    fluticasone  (FLONASE ) 50 MCG/ACT nasal spray, Place 2 sprays into both nostrils daily., Disp: , Rfl:    gabapentin  (NEURONTIN ) 300 MG capsule, Take 1 capsule (300 mg total) by mouth 3 (three) times daily., Disp: 90 capsule, Rfl: 3   hydrOXYzine  (VISTARIL ) 50 MG capsule, Take 50 mg by mouth 3 (three) times daily as needed for itching., Disp: , Rfl:    methylphenidate (METADATE CD) 20 MG CR capsule, Take 20 mg by mouth every morning., Disp: , Rfl:    minoxidil (LONITEN) 2.5 MG tablet, Take 2.5 mg by mouth daily., Disp: , Rfl:    montelukast  (SINGULAIR ) 10 MG tablet, Take 10 mg by mouth at bedtime., Disp: , Rfl:    Multiple Vitamin (MULTI-VITAMIN) tablet, Take 1 tablet by mouth daily., Disp: , Rfl:    Naltrexone HCl, Pain, 4.5 MG CAPS, Take 4.5 mg by mouth daily., Disp: , Rfl:    naproxen sodium (ALEVE) 220 MG tablet, Take 220 mg by mouth daily as needed., Disp: , Rfl:    ORPHENADRINE CITRATE PO, Take 100 mg by mouth. PRN, Disp: , Rfl:    pregabalin (LYRICA) 25 MG capsule, Take 50 mg by mouth 2 (two) times daily., Disp: , Rfl:    spironolactone  (ALDACTONE ) 100 MG tablet, Take 100 mg by mouth 2 (two) times daily., Disp: , Rfl:    tirzepatide (MOUNJARO) 15 MG/0.5ML Pen, Inject 5 mg into the skin once a week., Disp: , Rfl:     Vitamin D, Ergocalciferol, (DRISDOL) 1.25 MG (50000 UNIT) CAPS capsule, Take 50,000 Units by mouth every 7 (seven) days., Disp: , Rfl:    DULoxetine (CYMBALTA) 30 MG capsule, Take 30 mg by mouth daily. (Patient not taking: Reported on 02/17/2024), Disp: , Rfl:    LYSINE PO, Take 1,000 mg by mouth 2 (two) times daily. (Patient not taking: Reported on 02/17/2024), Disp: , Rfl:    methocarbamol  (ROBAXIN ) 500 MG tablet, Take 1 tablet (500 mg total) by mouth every 6 (six) hours as needed for muscle spasms., Disp: 40 tablet, Rfl: 0  Social History: Social History   Tobacco Use   Smoking status: Never   Smokeless tobacco: Never  Vaping Use   Vaping status: Never Used  Substance Use Topics   Alcohol use: Yes  Alcohol/week: 0.0 - 1.0 standard drinks of alcohol    Comment: rarely   Drug use: Not Currently    Family Medical History: History reviewed. No pertinent family history.  Physical Examination: Vitals:   02/17/24 1116  BP: 130/80      General: Patient is in no apparent distress. Attention to examination is appropriate.  MAEW   Pain down R arm in radicular fashion C6 and C7  Clinical myelopathy previously noted    Medical Decision Making  Imaging: CT Head 11/05/2023 IMPRESSION: 1. No acute intracranial CT findings. 2. 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     Electronically Signed   By: Denman Fischer M.D.   On: 11/05/2023 02:08  MRI L spine 02/04/2023 IMPRESSION: 1. T3-T4 mild spinal canal stenosis and mild left neural foraminal narrowing. 2. L5-S1 disc protrusion has decreased in size but appears to contact the descending left S1 nerve roots. Narrowing of the lateral recesses at this level could also affect the descending S1 nerve roots. 3. L4-L5 mild-to-moderate facet arthropathy. Narrowing of the lateral recesses at this level could affect the descending L5 nerve roots.     Electronically Signed   By: Zoila Hines M.D.   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   Otherwise normal.   No follow-up recommended.     Electronically Signed by: Opal Bill, MD on 01/04/2024 1:22 PM   L spine flex/ext 12/30/2023 IMPRESSION:  1. No acute radiographic findings.  2.  Multilevel degenerative changes.   Electronically Signed by: Anette Kehr, MD on 01/01/2024 9:33 AM   MRI C spine 12/30/2023 IMPRESSION:    1. Prior ACDF at C3-4 and C4-5 with solid arthrodesis  2. At C4-5 there is ossification of the disc level which effaces the thecal sac and slightly indents the cord.  3. At C5-6 there is degenerative disc disease with disc bulge and foraminal spurs causing moderate spinal stenosis and severe bilateral neuroforaminal stenosis.  4. At C6-7 there is a disc bulge with bilateral disc/foraminal spurs and moderate bilateral neuroforaminal stenosis   Electronically Signed by: Opal Bill, MD on 01/04/2024 1:15 PM   CT Cervical spine 01/29/2024 IMPRESSION:  1. Degenerative and postsurgical changes. Remote ACDF C3-4 and C4-5. There appears to be solid bony fusion anteriorly at both levels. Anterior fixating hardware in place without complicating features. 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 Prominent foraminal stenosis again noted bilaterally at C3-4, C4-5, C5-6 and on the left at C6-7.  2.  No acute bony abnormality.   Electronically Signed by: Dickie Found, MD on 02/04/2024 8:44 AM   I have personally reviewed the images and agree with the above interpretation.  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  That does not require further follow-up.  For her neck, she is suffering from cervical myelopathy.  She previously had surgery for this a couple of years ago.  She now has severe  stenosis that has recurred.  She has severe central stenosis at C4-5 and C5-6 with foraminal stenosis from C3-C7.  She has worsening symptoms of clinical myelopathy as shown by her dexterity changes, numbness and tingling in her hands, and balance changes.  At this point, no further conservative management is indicated.  She has tried multiple  rounds of physical therapy in the past.  I have recommended posterior cervical intervention with C3-7 laminectomy with bilateral foraminotomies and instrumentation from C3-7.  She is already fused at C4-5 and probably fused at C3-4, but I would recommend instrumentation of those levels to solidify the construct.  We will attempt to achieve fusion from C5-7.  I discussed the planned procedure at length with the patient, including the risks, benefits, alternatives, and indications. The risks discussed include but are not limited to bleeding, infection, need for reoperation, spinal fluid leak, stroke, vision loss, anesthetic complication, coma, paralysis, and even death. I also described in detail that improvement was not guaranteed.  The patient expressed understanding of these risks, and asked that we proceed with surgery. I described the surgery in layman's terms, and gave ample opportunity for questions, which were answered to the best of my ability.  I spent a total of 40 minutes in this patient's care today. This time was spent reviewing pertinent records including imaging studies, obtaining and confirming history, performing a directed evaluation, formulating and discussing my recommendations, and documenting the visit within the medical record.     Thank you for involving me in the care of this patient.      Arnitra Sokoloski K. Mont Antis MD, [facility] Neurosurgery

## 2024-03-03 ENCOUNTER — Encounter
Admission: RE | Admit: 2024-03-03 | Discharge: 2024-03-03 | Disposition: A | Discharge status: Home / Self Care | Source: Ambulatory Visit | Attending: Neurosurgery | Admitting: Neurosurgery

## 2024-03-03 ENCOUNTER — Other Ambulatory Visit: Payer: Self-pay

## 2024-03-03 VITALS — BP 120/78 | HR 98 | Resp 16 | Wt 233.0 lb

## 2024-03-03 DIAGNOSIS — I10 Essential (primary) hypertension: Secondary | ICD-10-CM | POA: Diagnosis not present

## 2024-03-03 DIAGNOSIS — Z01810 Encounter for preprocedural cardiovascular examination: Secondary | ICD-10-CM | POA: Diagnosis present

## 2024-03-03 DIAGNOSIS — Z01818 Encounter for other preprocedural examination: Secondary | ICD-10-CM | POA: Insufficient documentation

## 2024-03-03 DIAGNOSIS — E119 Type 2 diabetes mellitus without complications: Secondary | ICD-10-CM | POA: Insufficient documentation

## 2024-03-03 DIAGNOSIS — Z01812 Encounter for preprocedural laboratory examination: Secondary | ICD-10-CM

## 2024-03-03 DIAGNOSIS — Z794 Long term (current) use of insulin: Secondary | ICD-10-CM | POA: Diagnosis not present

## 2024-03-03 LAB — CBC
HCT: 40.3 % (ref 36.0–46.0)
Hemoglobin: 12.8 g/dL (ref 12.0–15.0)
MCH: 27.1 pg (ref 26.0–34.0)
MCHC: 31.8 g/dL (ref 30.0–36.0)
MCV: 85.4 fL (ref 80.0–100.0)
Platelets: 306 10*3/uL (ref 150–400)
RBC: 4.72 MIL/uL (ref 3.87–5.11)
RDW: 14.4 % (ref 11.5–15.5)
WBC: 9.1 10*3/uL (ref 4.0–10.5)
nRBC: 0 % (ref 0.0–0.2)

## 2024-03-03 LAB — BASIC METABOLIC PANEL WITH GFR
Anion gap: 8 (ref 5–15)
BUN: 15 mg/dL (ref 6–20)
CO2: 27 mmol/L (ref 22–32)
Calcium: 9.5 mg/dL (ref 8.9–10.3)
Chloride: 103 mmol/L (ref 98–111)
Creatinine, Ser: 0.75 mg/dL (ref 0.44–1.00)
GFR, Estimated: >60 mL/min (ref >60)
Glucose, Bld: 101 mg/dL — ABNORMAL HIGH (ref 70–99)
Potassium: 4.1 mmol/L (ref 3.5–5.1)
Sodium: 138 mmol/L (ref 135–145)

## 2024-03-03 LAB — TYPE AND SCREEN
ABO/RH(D): O POS
Antibody Screen: NEGATIVE

## 2024-03-03 LAB — SURGICAL PCR SCREEN
MRSA, PCR: NEGATIVE
Staphylococcus aureus: POSITIVE — AB

## 2024-03-03 NOTE — Patient Instructions (Addendum)
 Your procedure is scheduled on: 03/15/24 - Monday Report to the Registration Desk on the 1st floor of the [facility]. To find out your arrival time, please call [phone] between 1PM - 3PM on: 03/12/24 - Friday If your arrival time is 6:00 am, do not arrive before that time as the [facility] entrance doors do not open until 6:00 am.  REMEMBER: Instructions that are not followed completely may result in serious medical risk, up to and including death; or upon the discretion of your surgeon and anesthesiologist your surgery may need to be rescheduled.  Do not eat food after midnight the night before surgery.  No gum chewing or hard candies.  You may however, drink CLEAR liquids up to 2 hours before you are scheduled to arrive for your surgery. Do not drink anything within 2 hours of your scheduled arrival time.  Clear liquids include: - water    You may continue if needed, Anti-inflammatories (NSAIDS) such as Advil , Aleve, Ibuprofen , Motrin , Naproxen, Naprosyn and Aspirin based products such as Excedrin, Goody's Powder, BC Powder.  Stop ANY OVER THE COUNTER supplements until after surgery : Multiple Vitamin   Blood thinners:    BC headaches and body powder (aspirin 845mg , caffeine 65mg ): avoid for 7 days prior to surgery and 14 days after surgery     Diabetes/weight loss medications:   Per anesthesia guidelines (due to the increased risk of aspiration caused by delayed gastric emptying):    Mounjaro: hold for 7 days prior to surgery   Continue taking all of your other prescription medications up until the day before surgery.  ON THE DAY OF SURGERY ONLY TAKE THESE MEDICATIONS WITH SIPS OF WATER:  famotidine  (PEPCID )  montelukast  (SINGULAIR )  pregabalin (LYRICA)   Bring your albuterol  (VENTOLIN  HFA) to the hospital on the morning of surgery.   No Alcohol for 24 hours before or after surgery.  No Smoking including e-cigarettes for 24 hours before surgery.  No chewable  tobacco products for at least 6 hours before surgery.  No nicotine patches on the day of surgery.  Do not use any "recreational" drugs for at least a week (preferably 2 weeks) before your surgery.  Please be advised that the combination of cocaine and anesthesia may have negative outcomes, up to and including death. If you test positive for cocaine, your surgery will be cancelled.  On the morning of surgery brush your teeth with toothpaste and water, you may rinse your mouth with mouthwash if you wish. Do not swallow any toothpaste or mouthwash.  Use CHG Soap or wipes as directed on instruction sheet.  Do not wear jewelry, make-up, hairpins, clips or nail polish.  For welded (permanent) jewelry: bracelets, anklets, waist bands, etc.  Please have this removed prior to surgery.  If it is not removed, there is a chance that hospital personnel will need to cut it off on the day of surgery.  Do not wear lotions, powders, or perfumes.   Contact lenses, hearing aids and dentures may not be worn into surgery.  Do not bring valuables to the hospital. [facility] is not responsible for any missing/lost belongings or valuables.   Notify your doctor if there is any change in your medical condition (cold, fever, infection).  Wear comfortable clothing (specific to your surgery type) to the hospital.  After surgery, you can help prevent lung complications by doing breathing exercises.  Take deep breaths and cough every 1-2 hours. Your doctor may order a device called an  Incentive Spirometer to help you take deep breaths.  When coughing or sneezing, hold a pillow firmly against your incision with both hands. This is called "splinting." Doing this helps protect your incision. It also decreases belly discomfort.  If you are being admitted to the hospital overnight, leave your suitcase in the car. After surgery it may be brought to your room.  In case of increased patient census, it may be necessary  for you, the patient, to continue your postoperative care in the Same Day Surgery department.  If you are being discharged the day of surgery, you will not be allowed to drive home. You will need a responsible individual to drive you home and stay with you for 24 hours after surgery.   If you are taking public transportation, you will need to have a responsible individual with you.  Please call the Pre-admissions Testing Dept. at [phone] if you have any questions about these instructions.  Surgery Visitation Policy:  Patients having surgery or a procedure may have two visitors.  Children under the age of 33 must have an adult with them who is not the patient.  Inpatient Visitation:    Visiting hours are 7 a.m. to 8 p.m. Up to four visitors are allowed at one time in a patient room. The visitors may rotate out with other people during the day.  One visitor age 54 or older may stay with the patient overnight and must be in the room by 8 p.m.    Pre-operative 5 CHG Bath Instructions   You can play a key role in reducing the risk of infection after surgery. Your skin needs to be as free of germs as possible. You can reduce the number of germs on your skin by washing with CHG (chlorhexidine  gluconate) soap before surgery. CHG is an antiseptic soap that kills germs and continues to kill germs even after washing.   DO NOT use if you have an allergy to chlorhexidine /CHG or antibacterial soaps. If your skin becomes reddened or irritated, stop using the CHG and notify one of our RNs at [phone].   Please shower with the CHG soap starting 4 days before surgery using the following schedule: 06/12 - 06/16.    Please keep in mind the following:  DO NOT shave, including legs and underarms, starting the day of your first shower.   You may shave your face at any point before/day of surgery.  Place clean sheets on your bed the day you start using CHG soap. Use a clean washcloth (not used  since being washed) for each shower. DO NOT sleep with pets once you start using the CHG.   CHG Shower Instructions:  If you choose to wash your hair and private area, wash first with your normal shampoo/soap.  After you use shampoo/soap, rinse your hair and body thoroughly to remove shampoo/soap residue.  Turn the water OFF and apply about 3 tablespoons (45 ml) of CHG soap to a CLEAN washcloth.  Apply CHG soap ONLY FROM YOUR NECK DOWN TO YOUR TOES (washing for 3-5 minutes)  DO NOT use CHG soap on face, private areas, open wounds, or sores.  Pay special attention to the area where your surgery is being performed.  If you are having back surgery, having someone wash your back for you may be helpful. Wait 2 minutes after CHG soap is applied, then you may rinse off the CHG soap.  Pat dry with a clean towel  Put on clean clothes/pajamas  If you choose to wear lotion, please use ONLY the CHG-compatible lotions on the back of this paper.     Additional instructions for the day of surgery: DO NOT APPLY any lotions, deodorants, cologne, or perfumes.   Put on clean/comfortable clothes.  Brush your teeth.  Ask your nurse before applying any prescription medications to the skin.      CHG Compatible Lotions   Aveeno Moisturizing lotion  Cetaphil Moisturizing Cream  Cetaphil Moisturizing Lotion  Clairol Herbal Essence Moisturizing Lotion, Dry Skin  Clairol Herbal Essence Moisturizing Lotion, Extra Dry Skin  Clairol Herbal Essence Moisturizing Lotion, Normal Skin  Curel Age Defying Therapeutic Moisturizing Lotion with Alpha Hydroxy  Curel Extreme Care Body Lotion  Curel Soothing Hands Moisturizing Hand Lotion  Curel Therapeutic Moisturizing Cream, Fragrance-Free  Curel Therapeutic Moisturizing Lotion, Fragrance-Free  Curel Therapeutic Moisturizing Lotion, Original Formula  Eucerin Daily Replenishing Lotion  Eucerin Dry Skin Therapy Plus Alpha Hydroxy Crme  Eucerin Dry Skin Therapy Plus  Alpha Hydroxy Lotion  Eucerin Original Crme  Eucerin Original Lotion  Eucerin Plus Crme Eucerin Plus Lotion  Eucerin TriLipid Replenishing Lotion  Keri Anti-Bacterial Hand Lotion  Keri Deep Conditioning Original Lotion Dry Skin Formula Softly Scented  Keri Deep Conditioning Original Lotion, Fragrance Free Sensitive Skin Formula  Keri Lotion Fast Absorbing Fragrance Free Sensitive Skin Formula  Keri Lotion Fast Absorbing Softly Scented Dry Skin Formula  Keri Original Lotion  Keri Skin Renewal Lotion Keri Silky Smooth Lotion  Keri Silky Smooth Sensitive Skin Lotion  Nivea Body Creamy Conditioning Oil  Nivea Body Extra Enriched Teacher, adult education Moisturizing Lotion Nivea Crme  Nivea Skin Firming Lotion  NutraDerm 30 Skin Lotion  NutraDerm Skin Lotion  NutraDerm Therapeutic Skin Cream  NutraDerm Therapeutic Skin Lotion  ProShield Protective Hand Cream  Provon moisturizing lotion

## 2024-03-15 ENCOUNTER — Encounter: Payer: Self-pay | Admitting: Neurosurgery

## 2024-03-15 ENCOUNTER — Inpatient Hospital Stay: Payer: Self-pay | Admitting: Urgent Care

## 2024-03-15 ENCOUNTER — Inpatient Hospital Stay
Admission: RE | Admit: 2024-03-15 | Discharge: 2024-03-19 | DRG: 473 | Disposition: A | Discharge status: Rehabilitation Facility | Attending: Neurosurgery | Admitting: Neurosurgery

## 2024-03-15 ENCOUNTER — Other Ambulatory Visit: Payer: Self-pay

## 2024-03-15 ENCOUNTER — Inpatient Hospital Stay

## 2024-03-15 ENCOUNTER — Encounter
Admission: RE | Disposition: A | Discharge status: Rehabilitation Facility | Payer: Self-pay | Source: Home / Self Care | Attending: Neurosurgery

## 2024-03-15 ENCOUNTER — Inpatient Hospital Stay: Admitting: Certified Registered"

## 2024-03-15 DIAGNOSIS — G8918 Other acute postprocedural pain: Secondary | ICD-10-CM | POA: Diagnosis not present

## 2024-03-15 DIAGNOSIS — G959 Disease of spinal cord, unspecified: Secondary | ICD-10-CM | POA: Diagnosis present

## 2024-03-15 DIAGNOSIS — R200 Anesthesia of skin: Secondary | ICD-10-CM | POA: Diagnosis present

## 2024-03-15 DIAGNOSIS — Z01812 Encounter for preprocedural laboratory examination: Secondary | ICD-10-CM

## 2024-03-15 DIAGNOSIS — D62 Acute posthemorrhagic anemia: Secondary | ICD-10-CM | POA: Diagnosis not present

## 2024-03-15 DIAGNOSIS — M542 Cervicalgia: Secondary | ICD-10-CM | POA: Diagnosis not present

## 2024-03-15 DIAGNOSIS — M50122 Cervical disc disorder at C5-C6 level with radiculopathy: Principal | ICD-10-CM | POA: Diagnosis present

## 2024-03-15 DIAGNOSIS — F32A Depression, unspecified: Secondary | ICD-10-CM | POA: Diagnosis present

## 2024-03-15 DIAGNOSIS — R1013 Epigastric pain: Secondary | ICD-10-CM | POA: Diagnosis not present

## 2024-03-15 DIAGNOSIS — Z981 Arthrodesis status: Principal | ICD-10-CM

## 2024-03-15 DIAGNOSIS — F419 Anxiety disorder, unspecified: Secondary | ICD-10-CM | POA: Diagnosis present

## 2024-03-15 DIAGNOSIS — E785 Hyperlipidemia, unspecified: Secondary | ICD-10-CM | POA: Diagnosis present

## 2024-03-15 DIAGNOSIS — R3915 Urgency of urination: Secondary | ICD-10-CM | POA: Diagnosis not present

## 2024-03-15 DIAGNOSIS — M50022 Cervical disc disorder at C5-C6 level with myelopathy: Secondary | ICD-10-CM | POA: Diagnosis present

## 2024-03-15 DIAGNOSIS — M797 Fibromyalgia: Secondary | ICD-10-CM | POA: Diagnosis present

## 2024-03-15 DIAGNOSIS — J45909 Unspecified asthma, uncomplicated: Secondary | ICD-10-CM | POA: Diagnosis present

## 2024-03-15 DIAGNOSIS — S143XXA Injury of brachial plexus, initial encounter: Secondary | ICD-10-CM | POA: Diagnosis not present

## 2024-03-15 DIAGNOSIS — Z79899 Other long term (current) drug therapy: Secondary | ICD-10-CM | POA: Diagnosis not present

## 2024-03-15 DIAGNOSIS — F418 Other specified anxiety disorders: Secondary | ICD-10-CM | POA: Diagnosis not present

## 2024-03-15 DIAGNOSIS — E119 Type 2 diabetes mellitus without complications: Secondary | ICD-10-CM | POA: Diagnosis present

## 2024-03-15 DIAGNOSIS — K5901 Slow transit constipation: Secondary | ICD-10-CM | POA: Diagnosis not present

## 2024-03-15 DIAGNOSIS — Z90710 Acquired absence of both cervix and uterus: Secondary | ICD-10-CM

## 2024-03-15 DIAGNOSIS — N319 Neuromuscular dysfunction of bladder, unspecified: Secondary | ICD-10-CM | POA: Diagnosis not present

## 2024-03-15 DIAGNOSIS — Z794 Long term (current) use of insulin: Secondary | ICD-10-CM | POA: Diagnosis not present

## 2024-03-15 DIAGNOSIS — I10 Essential (primary) hypertension: Secondary | ICD-10-CM | POA: Diagnosis present

## 2024-03-15 DIAGNOSIS — M5412 Radiculopathy, cervical region: Secondary | ICD-10-CM

## 2024-03-15 DIAGNOSIS — Z7985 Long-term (current) use of injectable non-insulin antidiabetic drugs: Secondary | ICD-10-CM

## 2024-03-15 DIAGNOSIS — Y838 Other surgical procedures as the cause of abnormal reaction of the patient, or of later complication, without mention of misadventure at the time of the procedure: Secondary | ICD-10-CM | POA: Diagnosis not present

## 2024-03-15 DIAGNOSIS — Z888 Allergy status to other drugs, medicaments and biological substances status: Secondary | ICD-10-CM | POA: Diagnosis not present

## 2024-03-15 DIAGNOSIS — M4802 Spinal stenosis, cervical region: Secondary | ICD-10-CM | POA: Diagnosis present

## 2024-03-15 DIAGNOSIS — G4700 Insomnia, unspecified: Secondary | ICD-10-CM | POA: Diagnosis not present

## 2024-03-15 DIAGNOSIS — D72829 Elevated white blood cell count, unspecified: Secondary | ICD-10-CM | POA: Diagnosis not present

## 2024-03-15 DIAGNOSIS — Z7982 Long term (current) use of aspirin: Secondary | ICD-10-CM

## 2024-03-15 DIAGNOSIS — I951 Orthostatic hypotension: Secondary | ICD-10-CM | POA: Diagnosis not present

## 2024-03-15 DIAGNOSIS — R682 Dry mouth, unspecified: Secondary | ICD-10-CM | POA: Diagnosis not present

## 2024-03-15 DIAGNOSIS — R000 Tachycardia, unspecified: Secondary | ICD-10-CM | POA: Diagnosis not present

## 2024-03-15 DIAGNOSIS — K5900 Constipation, unspecified: Secondary | ICD-10-CM | POA: Diagnosis not present

## 2024-03-15 DIAGNOSIS — Z01818 Encounter for other preprocedural examination: Secondary | ICD-10-CM

## 2024-03-15 HISTORY — PX: POSTERIOR CERVICAL FUSION/FORAMINOTOMY: SHX5038

## 2024-03-15 HISTORY — DX: Radiculopathy, cervical region: M54.12

## 2024-03-15 HISTORY — DX: Spinal stenosis, cervical region: M48.02

## 2024-03-15 LAB — URINALYSIS, ROUTINE W REFLEX MICROSCOPIC
Bilirubin Urine: NEGATIVE
Glucose, UA: NEGATIVE mg/dL
Hgb urine dipstick: NEGATIVE
Ketones, ur: NEGATIVE mg/dL
Leukocytes,Ua: NEGATIVE
Nitrite: NEGATIVE
Protein, ur: NEGATIVE mg/dL
Specific Gravity, Urine: 1.01 (ref 1.005–1.030)
pH: 6 (ref 5.0–8.0)

## 2024-03-15 LAB — GLUCOSE, CAPILLARY
Glucose-Capillary: 106 mg/dL — ABNORMAL HIGH (ref 70–99)
Glucose-Capillary: 134 mg/dL — ABNORMAL HIGH (ref 70–99)

## 2024-03-15 SURGERY — POSTERIOR CERVICAL FUSION/FORAMINOTOMY LEVEL 4
Anesthesia: General

## 2024-03-15 MED ORDER — ENOXAPARIN SODIUM 40 MG/0.4ML IJ SOSY
40.0000 mg | PREFILLED_SYRINGE | INTRAMUSCULAR | Status: DC
  Administered 2024-03-16 – 2024-03-19 (×4): 40 mg via SUBCUTANEOUS
  Filled 2024-03-15 (×4): qty 0.4

## 2024-03-15 MED ORDER — FENTANYL CITRATE (PF) 100 MCG/2ML IJ SOLN
INTRAMUSCULAR | Status: AC
Start: 2024-03-15 — End: 2024-03-15
  Filled 2024-03-15: qty 2

## 2024-03-15 MED ORDER — KETOROLAC TROMETHAMINE 15 MG/ML IJ SOLN
15.0000 mg | Freq: Four times a day (QID) | INTRAMUSCULAR | Status: DC
  Administered 2024-03-15 – 2024-03-16 (×3): 15 mg via INTRAVENOUS
  Filled 2024-03-15 (×3): qty 1

## 2024-03-15 MED ORDER — VITAMIN D (ERGOCALCIFEROL) 1.25 MG (50000 UNIT) PO CAPS
50000.0000 [IU] | ORAL_CAPSULE | ORAL | Status: DC
Start: 2024-03-20 — End: 2024-03-19

## 2024-03-15 MED ORDER — REMIFENTANIL HCL 1 MG IV SOLR
INTRAVENOUS | Status: AC
Start: 2024-03-15 — End: 2024-03-15
  Filled 2024-03-15: qty 1000

## 2024-03-15 MED ORDER — PROPOFOL 500 MG/50ML IV EMUL
INTRAVENOUS | Status: DC | PRN
Start: 2024-03-15 — End: 2024-03-15
  Administered 2024-03-15: 165 ug/kg/min via INTRAVENOUS

## 2024-03-15 MED ORDER — FLUTICASONE PROPIONATE 50 MCG/ACT NA SUSP
2.0000 | Freq: Every day | NASAL | Status: DC
  Administered 2024-03-15 – 2024-03-18 (×4): 2 via NASAL
  Filled 2024-03-15: qty 16

## 2024-03-15 MED ORDER — METHOCARBAMOL 500 MG PO TABS
500.0000 mg | ORAL_TABLET | Freq: Four times a day (QID) | ORAL | Status: DC | PRN
  Administered 2024-03-15 – 2024-03-16 (×2): 500 mg via ORAL
  Filled 2024-03-15 (×2): qty 1

## 2024-03-15 MED ORDER — VANCOMYCIN HCL 1000 MG IV SOLR
INTRAVENOUS | Status: DC | PRN
  Administered 2024-03-15: 1000 mg via TOPICAL

## 2024-03-15 MED ORDER — PHENYLEPHRINE 80 MCG/ML (10ML) SYRINGE FOR IV PUSH (FOR BLOOD PRESSURE SUPPORT)
PREFILLED_SYRINGE | INTRAVENOUS | Status: DC | PRN
  Administered 2024-03-15: 160 ug via INTRAVENOUS
  Administered 2024-03-15: 80 ug via INTRAVENOUS
  Administered 2024-03-15: 160 ug via INTRAVENOUS
  Administered 2024-03-15: 80 ug via INTRAVENOUS
  Administered 2024-03-15 (×3): 160 ug via INTRAVENOUS
  Administered 2024-03-15: 80 ug via INTRAVENOUS

## 2024-03-15 MED ORDER — MIDAZOLAM HCL 2 MG/2ML IJ SOLN
INTRAMUSCULAR | Status: DC | PRN
  Administered 2024-03-15: 2 mg via INTRAVENOUS

## 2024-03-15 MED ORDER — MORPHINE SULFATE (PF) 2 MG/ML IV SOLN
2.0000 mg | INTRAVENOUS | Status: AC | PRN
  Administered 2024-03-15 (×3): 2 mg via INTRAVENOUS
  Filled 2024-03-15 (×3): qty 1

## 2024-03-15 MED ORDER — HYDROXYZINE HCL 50 MG PO TABS
50.0000 mg | ORAL_TABLET | Freq: Every evening | ORAL | Status: DC | PRN
  Administered 2024-03-16 – 2024-03-17 (×2): 50 mg via ORAL
  Filled 2024-03-15 (×3): qty 1

## 2024-03-15 MED ORDER — SODIUM CHLORIDE 0.9% FLUSH: 3.0000 mL | INTRAVENOUS | Status: DC | PRN

## 2024-03-15 MED ORDER — DEXAMETHASONE SODIUM PHOSPHATE 10 MG/ML IJ SOLN
INTRAMUSCULAR | Status: DC | PRN
  Administered 2024-03-15: 10 mg via INTRAVENOUS

## 2024-03-15 MED ORDER — SODIUM CHLORIDE 0.9 % IV SOLN: INTRAVENOUS | Status: DC

## 2024-03-15 MED ORDER — MUPIROCIN 2 % EX OINT: 1.0000 | TOPICAL_OINTMENT | Freq: Two times a day (BID) | CUTANEOUS | 0 refills | Status: DC

## 2024-03-15 MED ORDER — KETAMINE HCL 50 MG/5ML IJ SOSY
PREFILLED_SYRINGE | INTRAMUSCULAR | Status: AC
  Filled 2024-03-15: qty 5

## 2024-03-15 MED ORDER — KETOROLAC TROMETHAMINE 0.5 % OP SOLN
1.0000 [drp] | Freq: Four times a day (QID) | OPHTHALMIC | Status: AC
  Administered 2024-03-15 – 2024-03-16 (×4): 1 [drp] via OPHTHALMIC
  Filled 2024-03-15 (×2): qty 3

## 2024-03-15 MED ORDER — ONDANSETRON HCL 4 MG PO TABS: 4.0000 mg | ORAL_TABLET | Freq: Four times a day (QID) | ORAL | Status: DC | PRN

## 2024-03-15 MED ORDER — CHLORHEXIDINE GLUCONATE 4 % EX SOLN: 1.0000 | CUTANEOUS | 1 refills | Status: DC

## 2024-03-15 MED ORDER — SODIUM CHLORIDE 0.9% FLUSH
3.0000 mL | Freq: Two times a day (BID) | INTRAVENOUS | Status: DC
  Administered 2024-03-15 – 2024-03-19 (×8): 3 mL via INTRAVENOUS

## 2024-03-15 MED ORDER — PREGABALIN 75 MG PO CAPS
75.0000 mg | ORAL_CAPSULE | Freq: Two times a day (BID) | ORAL | Status: DC
Start: 2024-03-15 — End: 2024-03-16
  Administered 2024-03-15 – 2024-03-16 (×2): 75 mg via ORAL
  Filled 2024-03-15 (×2): qty 1

## 2024-03-15 MED ORDER — PROPOFOL 1000 MG/100ML IV EMUL
INTRAVENOUS | Status: AC
Start: 2024-03-15 — End: 2024-03-15
  Filled 2024-03-15: qty 100

## 2024-03-15 MED ORDER — CHLORHEXIDINE GLUCONATE 0.12 % MT SOLN
OROMUCOSAL | Status: AC
  Filled 2024-03-15: qty 15

## 2024-03-15 MED ORDER — POLYETHYLENE GLYCOL 3350 17 G PO PACK: 17.0000 g | PACK | Freq: Every day | ORAL | Status: DC | PRN

## 2024-03-15 MED ORDER — FOLIC ACID 1 MG PO TABS
1.0000 mg | ORAL_TABLET | Freq: Every day | ORAL | Status: DC
  Administered 2024-03-15 – 2024-03-19 (×5): 1 mg via ORAL
  Filled 2024-03-15 (×5): qty 1

## 2024-03-15 MED ORDER — SODIUM CHLORIDE 0.9 % IV SOLN: 250.0000 mL | INTRAVENOUS | Status: AC

## 2024-03-15 MED ORDER — VENLAFAXINE HCL ER 75 MG PO CP24
75.0000 mg | ORAL_CAPSULE | Freq: Every day | ORAL | Status: DC
Start: 2024-03-16 — End: 2024-03-19
  Administered 2024-03-16 – 2024-03-17 (×2): 75 mg via ORAL
  Filled 2024-03-15 (×4): qty 1

## 2024-03-15 MED ORDER — FENTANYL CITRATE (PF) 100 MCG/2ML IJ SOLN
INTRAMUSCULAR | Status: DC | PRN
  Administered 2024-03-15: 100 ug via INTRAVENOUS
  Administered 2024-03-15 (×3): 50 ug via INTRAVENOUS

## 2024-03-15 MED ORDER — OXYCODONE HCL 5 MG PO TABS
5.0000 mg | ORAL_TABLET | Freq: Once | ORAL | Status: AC | PRN
  Administered 2024-03-15: 5 mg via ORAL

## 2024-03-15 MED ORDER — SODIUM CHLORIDE (PF) 0.9 % IJ SOLN
INTRAMUSCULAR | Status: AC
  Filled 2024-03-15: qty 20

## 2024-03-15 MED ORDER — METHOCARBAMOL 1000 MG/10ML IJ SOLN
INTRAMUSCULAR | Status: AC
  Filled 2024-03-15: qty 10

## 2024-03-15 MED ORDER — CHLORHEXIDINE GLUCONATE 0.12 % MT SOLN
15.0000 mL | Freq: Once | OROMUCOSAL | Status: AC
  Administered 2024-03-15: 15 mL via OROMUCOSAL

## 2024-03-15 MED ORDER — OXYCODONE HCL 5 MG PO TABS
10.0000 mg | ORAL_TABLET | ORAL | Status: DC | PRN
  Administered 2024-03-15 – 2024-03-16 (×4): 10 mg via ORAL
  Filled 2024-03-15 (×4): qty 2

## 2024-03-15 MED ORDER — DOCUSATE SODIUM 100 MG PO CAPS
100.0000 mg | ORAL_CAPSULE | Freq: Two times a day (BID) | ORAL | Status: DC
  Administered 2024-03-15 – 2024-03-19 (×7): 100 mg via ORAL
  Filled 2024-03-15 (×7): qty 1

## 2024-03-15 MED ORDER — 0.9 % SODIUM CHLORIDE (POUR BTL) OPTIME
TOPICAL | Status: DC | PRN
  Administered 2024-03-15: 325 mL

## 2024-03-15 MED ORDER — MIDAZOLAM HCL 2 MG/2ML IJ SOLN
INTRAMUSCULAR | Status: AC
  Filled 2024-03-15: qty 2

## 2024-03-15 MED ORDER — OXYCODONE HCL 5 MG/5ML PO SOLN: 5.0000 mg | Freq: Once | ORAL | Status: AC | PRN

## 2024-03-15 MED ORDER — METHOCARBAMOL 1000 MG/10ML IJ SOLN
500.0000 mg | Freq: Four times a day (QID) | INTRAMUSCULAR | Status: DC | PRN
  Administered 2024-03-15: 500 mg via INTRAVENOUS

## 2024-03-15 MED ORDER — BUPIVACAINE-EPINEPHRINE (PF) 0.5% -1:200000 IJ SOLN
INTRAMUSCULAR | Status: DC | PRN
  Administered 2024-03-15: 7 mL

## 2024-03-15 MED ORDER — MORPHINE SULFATE (PF) 2 MG/ML IV SOLN
INTRAVENOUS | Status: AC
Start: 2024-03-15 — End: 2024-03-15
  Filled 2024-03-15: qty 1

## 2024-03-15 MED ORDER — REMIFENTANIL HCL 1 MG IV SOLR
INTRAVENOUS | Status: AC
  Filled 2024-03-15: qty 1000

## 2024-03-15 MED ORDER — PROPOFOL 1000 MG/100ML IV EMUL
INTRAVENOUS | Status: AC
  Filled 2024-03-15: qty 100

## 2024-03-15 MED ORDER — LIDOCAINE HCL (CARDIAC) PF 100 MG/5ML IV SOSY
PREFILLED_SYRINGE | INTRAVENOUS | Status: DC | PRN
  Administered 2024-03-15: 100 mg via INTRAVENOUS

## 2024-03-15 MED ORDER — FENTANYL CITRATE (PF) 100 MCG/2ML IJ SOLN
25.0000 ug | INTRAMUSCULAR | Status: AC | PRN
  Administered 2024-03-15 (×8): 25 ug via INTRAVENOUS

## 2024-03-15 MED ORDER — SODIUM CHLORIDE (PF) 0.9 % IJ SOLN
INTRAMUSCULAR | Status: DC | PRN
  Administered 2024-03-15: 60 mL via INTRAMUSCULAR

## 2024-03-15 MED ORDER — GLYCOPYRROLATE 0.2 MG/ML IJ SOLN
INTRAMUSCULAR | Status: DC | PRN
  Administered 2024-03-15: .2 mg via INTRAVENOUS

## 2024-03-15 MED ORDER — SUCCINYLCHOLINE CHLORIDE 200 MG/10ML IV SOSY
PREFILLED_SYRINGE | INTRAVENOUS | Status: DC | PRN
  Administered 2024-03-15: 100 mg via INTRAVENOUS

## 2024-03-15 MED ORDER — OXYCODONE HCL 5 MG PO TABS
ORAL_TABLET | ORAL | Status: AC
  Filled 2024-03-15: qty 1

## 2024-03-15 MED ORDER — PHENYLEPHRINE HCL-NACL 20-0.9 MG/250ML-% IV SOLN
INTRAVENOUS | Status: AC
  Filled 2024-03-15: qty 250

## 2024-03-15 MED ORDER — CEFAZOLIN SODIUM-DEXTROSE 2-4 GM/100ML-% IV SOLN
INTRAVENOUS | Status: AC
Start: 2024-03-15 — End: 2024-03-15
  Filled 2024-03-15: qty 100

## 2024-03-15 MED ORDER — MAGNESIUM CITRATE PO SOLN: 1.0000 | Freq: Once | ORAL | Status: DC | PRN

## 2024-03-15 MED ORDER — EPHEDRINE SULFATE-NACL 50-0.9 MG/10ML-% IV SOSY
PREFILLED_SYRINGE | INTRAVENOUS | Status: DC | PRN
  Administered 2024-03-15 (×4): 5 mg via INTRAVENOUS

## 2024-03-15 MED ORDER — LORATADINE 10 MG PO TABS
10.0000 mg | ORAL_TABLET | Freq: Every day | ORAL | Status: DC
  Administered 2024-03-15 – 2024-03-19 (×5): 10 mg via ORAL
  Filled 2024-03-15 (×5): qty 1

## 2024-03-15 MED ORDER — IRRISEPT - 450ML BOTTLE WITH 0.05% CHG IN STERILE WATER, USP 99.95% OPTIME
TOPICAL | Status: DC | PRN
  Administered 2024-03-15: 450 mL

## 2024-03-15 MED ORDER — CEFAZOLIN SODIUM 1 G IJ SOLR
INTRAMUSCULAR | Status: AC
Start: 2024-03-15 — End: 2024-03-15
  Filled 2024-03-15: qty 20

## 2024-03-15 MED ORDER — REMIFENTANIL HCL 1 MG IV SOLR
INTRAVENOUS | Status: DC | PRN
  Administered 2024-03-15: .15 ug/kg/min via INTRAVENOUS

## 2024-03-15 MED ORDER — SURGIFLO WITH THROMBIN (HEMOSTATIC MATRIX KIT) OPTIME
TOPICAL | Status: DC | PRN
  Administered 2024-03-15: 1 via TOPICAL

## 2024-03-15 MED ORDER — PHENYLEPHRINE HCL-NACL 20-0.9 MG/250ML-% IV SOLN
INTRAVENOUS | Status: DC | PRN
  Administered 2024-03-15: 25 ug/min via INTRAVENOUS

## 2024-03-15 MED ORDER — SENNA 8.6 MG PO TABS
1.0000 | ORAL_TABLET | Freq: Two times a day (BID) | ORAL | Status: DC
  Administered 2024-03-15 – 2024-03-19 (×7): 8.6 mg via ORAL
  Filled 2024-03-15 (×7): qty 1

## 2024-03-15 MED ORDER — DROPERIDOL 2.5 MG/ML IJ SOLN: 0.6250 mg | Freq: Once | INTRAMUSCULAR | Status: DC | PRN

## 2024-03-15 MED ORDER — MOMETASONE FUROATE 0.1 % EX CREA
1.0000 | TOPICAL_CREAM | Freq: Every day | CUTANEOUS | Status: DC
  Administered 2024-03-16 – 2024-03-18 (×3): 1 via TOPICAL
  Filled 2024-03-15: qty 15

## 2024-03-15 MED ORDER — ACETAMINOPHEN 10 MG/ML IV SOLN: 1000.0000 mg | Freq: Once | INTRAVENOUS | Status: DC | PRN

## 2024-03-15 MED ORDER — BACITRACIN-NEOMYCIN-POLYMYXIN OINTMENT TUBE
TOPICAL_OINTMENT | CUTANEOUS | Status: DC | PRN
  Filled 2024-03-15: qty 14.17

## 2024-03-15 MED ORDER — PHENOL 1.4 % MT LIQD: 1.0000 | OROMUCOSAL | Status: DC | PRN

## 2024-03-15 MED ORDER — PROPOFOL 10 MG/ML IV BOLUS
INTRAVENOUS | Status: DC | PRN
  Administered 2024-03-15 (×2): 50 mg via INTRAVENOUS
  Administered 2024-03-15: 150 mg via INTRAVENOUS

## 2024-03-15 MED ORDER — ORAL CARE MOUTH RINSE: 15.0000 mL | Freq: Once | OROMUCOSAL | Status: AC

## 2024-03-15 MED ORDER — KETAMINE HCL 10 MG/ML IJ SOLN
INTRAMUSCULAR | Status: DC | PRN
  Administered 2024-03-15 (×2): 10 mg via INTRAVENOUS
  Administered 2024-03-15: 20 mg via INTRAVENOUS
  Administered 2024-03-15: 10 mg via INTRAVENOUS

## 2024-03-15 MED ORDER — MENTHOL 3 MG MT LOZG: 1.0000 | LOZENGE | OROMUCOSAL | Status: DC | PRN

## 2024-03-15 MED ORDER — DEXMEDETOMIDINE HCL IN NACL 200 MCG/50ML IV SOLN
INTRAVENOUS | Status: DC | PRN
  Administered 2024-03-15: 8 ug via INTRAVENOUS
  Administered 2024-03-15: 12 ug via INTRAVENOUS

## 2024-03-15 MED ORDER — FENTANYL CITRATE (PF) 100 MCG/2ML IJ SOLN
INTRAMUSCULAR | Status: AC
  Filled 2024-03-15: qty 2

## 2024-03-15 MED ORDER — SORBITOL 70 % SOLN: 30.0000 mL | Freq: Every day | Status: DC | PRN

## 2024-03-15 MED ORDER — METHYLPHENIDATE HCL 10 MG PO TABS
20.0000 mg | ORAL_TABLET | Freq: Every morning | ORAL | Status: DC
  Administered 2024-03-16 – 2024-03-19 (×3): 20 mg via ORAL
  Filled 2024-03-15 (×4): qty 2

## 2024-03-15 MED ORDER — ONDANSETRON HCL 4 MG/2ML IJ SOLN
INTRAMUSCULAR | Status: DC | PRN
  Administered 2024-03-15 (×2): 4 mg via INTRAVENOUS

## 2024-03-15 MED ORDER — SPIRONOLACTONE 25 MG PO TABS
100.0000 mg | ORAL_TABLET | Freq: Two times a day (BID) | ORAL | Status: DC
Start: 2024-03-15 — End: 2024-03-19
  Administered 2024-03-15 – 2024-03-18 (×6): 100 mg via ORAL
  Filled 2024-03-15 (×6): qty 4

## 2024-03-15 MED ORDER — ROCURONIUM BROMIDE 10 MG/ML (PF) SYRINGE
PREFILLED_SYRINGE | INTRAVENOUS | Status: AC
Start: 2024-03-15 — End: 2024-03-15
  Filled 2024-03-15: qty 10

## 2024-03-15 MED ORDER — BSS IO SOLN
15.0000 mL | Freq: Once | INTRAOCULAR | Status: DC
Start: 2024-03-15 — End: 2024-03-19
  Filled 2024-03-15: qty 15

## 2024-03-15 MED ORDER — MINOXIDIL 2.5 MG PO TABS
2.5000 mg | ORAL_TABLET | Freq: Every morning | ORAL | Status: DC
  Administered 2024-03-16 – 2024-03-19 (×3): 2.5 mg via ORAL
  Filled 2024-03-15 (×4): qty 1

## 2024-03-15 MED ORDER — ONDANSETRON HCL 4 MG/2ML IJ SOLN: 4.0000 mg | Freq: Four times a day (QID) | INTRAMUSCULAR | Status: DC | PRN

## 2024-03-15 MED ORDER — OXYCODONE HCL 5 MG PO TABS: 5.0000 mg | ORAL_TABLET | ORAL | Status: DC | PRN

## 2024-03-15 MED ORDER — CEFAZOLIN IN SODIUM CHLORIDE 2-0.9 GM/100ML-% IV SOLN
2.0000 g | Freq: Once | INTRAVENOUS | Status: AC
  Administered 2024-03-15 (×2): 2 g via INTRAVENOUS

## 2024-03-15 MED ORDER — HYDROCORTISONE 1 % EX CREA
1.0000 | TOPICAL_CREAM | Freq: Two times a day (BID) | CUTANEOUS | Status: DC | PRN
  Filled 2024-03-15: qty 28

## 2024-03-15 SURGICAL SUPPLY — 44 items
ALLOGRAFT BONESTRIP KORE 2.5X5 (Bone Implant) IMPLANT
BASIN KIT SINGLE STR (MISCELLANEOUS) ×1 IMPLANT
BIT DRILL NS LF DISP RELINE C (BIT) IMPLANT
BUR NEURO DRILL SOFT 3.0X3.8M (BURR) ×1 IMPLANT
DERMABOND ADVANCED .7 DNX12 (GAUZE/BANDAGES/DRESSINGS) ×1 IMPLANT
DRAPE C ARM PK CFD 31 SPINE (DRAPES) ×1 IMPLANT
DRAPE LAPAROTOMY 100X77 ABD (DRAPES) ×1 IMPLANT
DRAPE MICROSCOPE SPINE 48X150 (DRAPES) IMPLANT
EVACUATOR 1/8 PVC DRAIN (DRAIN) IMPLANT
FEE INTRAOP CADWELL SUPPLY NCS (MISCELLANEOUS) IMPLANT
FEE INTRAOP MONITOR IMPULS NCS (MISCELLANEOUS) IMPLANT
GLOVE BIOGEL PI IND STRL 6.5 (GLOVE) ×1 IMPLANT
GLOVE SURG SYN 6.5 ES PF (GLOVE) ×1 IMPLANT
GLOVE SURG SYN 6.5 PF PI (GLOVE) ×1 IMPLANT
GLOVE SURG SYN 8.5 E (GLOVE) ×3 IMPLANT
GLOVE SURG SYN 8.5 PF PI (GLOVE) ×3 IMPLANT
GOWN SRG LRG LVL 4 IMPRV REINF (GOWNS) ×1 IMPLANT
GOWN SRG XL LVL 3 NONREINFORCE (GOWNS) ×1 IMPLANT
HOLDER FOLEY CATH W/STRAP (MISCELLANEOUS) IMPLANT
KIT PREVENA INCISION MGT 13 (CANNISTER) IMPLANT
KIT SPINAL PRONEVIEW (KITS) ×1 IMPLANT
LAVAGE JET IRRISEPT WOUND (IRRIGATION / IRRIGATOR) IMPLANT
MANIFOLD NEPTUNE II (INSTRUMENTS) ×1 IMPLANT
MARKER SKIN DUAL TIP RULER LAB (MISCELLANEOUS) ×1 IMPLANT
MILL MEDIUM DISP (BLADE) IMPLANT
NS IRRIG 1000ML POUR BTL (IV SOLUTION) ×1 IMPLANT
PACK LAMINECTOMY ARMC (PACKS) ×1 IMPLANT
PAD ARMBOARD POSITIONER FOAM (MISCELLANEOUS) ×1 IMPLANT
ROD PB RELINE C 3.5X70 (Rod) IMPLANT
SCREW LOCK RELINE C OPEN (Screw) IMPLANT
SCREW MA RELINE C 3.5X14 (Screw) ×4 IMPLANT
SCREW MA RELINE C 3.5X16 (Screw) ×5 IMPLANT
SCREW SP MA RELINE-C 3.5X14 (Screw) IMPLANT
SCREW SP MA RELINE-C 3.5X16 (Screw) IMPLANT
STAPLER SKIN PROX 35W (STAPLE) ×1 IMPLANT
SURGIFLO W/THROMBIN 8M KIT (HEMOSTASIS) ×1 IMPLANT
SUT STRATA 3-0 15 PS-2 (SUTURE) ×1 IMPLANT
SUT VIC AB 0 CT1 27XCR 8 STRN (SUTURE) ×2 IMPLANT
SUT VIC AB 2-0 CT1 18 (SUTURE) ×2 IMPLANT
SUTURE EHLN 3-0 FS-10 30 BLK (SUTURE) IMPLANT
SYR 30ML LL (SYRINGE) ×2 IMPLANT
TAPE CLOTH 3X10 WHT NS LF (GAUZE/BANDAGES/DRESSINGS) ×2 IMPLANT
TRAP FLUID SMOKE EVACUATOR (MISCELLANEOUS) ×1 IMPLANT
TRAY FOLEY SLVR 16FR LF STAT (SET/KITS/TRAYS/PACK) IMPLANT

## 2024-03-15 NOTE — Anesthesia Preprocedure Evaluation (Signed)
 Anesthesia Evaluation  Patient identified by MRN, date of birth, ID band Patient awake    Reviewed: Allergy & Precautions, H&P , NPO status , Patient's Chart, lab work & pertinent test results, reviewed documented beta blocker date and time   History of Anesthesia Complications (+) PONV, Family history of anesthesia reaction and history of anesthetic complications  Airway Mallampati: III  TM Distance: >3 FB Neck ROM: full  Mouth opening: Limited Mouth Opening  Dental  (+) Teeth Intact   Pulmonary asthma , sleep apnea , pneumonia, resolved   Pulmonary exam normal        Cardiovascular Exercise Tolerance: Good hypertension, On Medications negative cardio ROS Normal cardiovascular exam Rhythm:regular Rate:Normal     Neuro/Psych  PSYCHIATRIC DISORDERS Anxiety Depression     Neuromuscular disease    GI/Hepatic negative GI ROS, Neg liver ROS,,,  Endo/Other  negative endocrine ROSdiabetes, Well Controlled, Type 2    Renal/GU negative Renal ROS  negative genitourinary   Musculoskeletal   Abdominal   Peds  Hematology  (+) Blood dyscrasia, anemia   Anesthesia Other Findings Past Medical History: No date: Anxiety No date: Arthritis No date: Asthma No date: Cervical myelopathy with cervical radiculopathy (HCC) No date: Complication of anesthesia     Comment:  with c sections had a had time getting her sedated No date: DDD (degenerative disc disease), cervical No date: Depression No date: Diabetes mellitus without complication (HCC)     Comment:  type 2 No date: Family history of adverse reaction to anesthesia     Comment:  mother hard to wake up No date: Fibromyalgia No date: Hyperlipidemia No date: Hypertension No date: Idiopathic urticaria No date: Pneumonia No date: PONV (postoperative nausea and vomiting) 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 Past Surgical History: 2018: ABDOMINAL HYSTERECTOMY      Comment:  uterine fibroids 2022: ANKLE SURGERY; Right     Comment:  ligament repair 11/12/2021: ANTERIOR CERVICAL DECOMP/DISCECTOMY FUSION; N/A     Comment:  Procedure: C3-5 ANTERIOR CERVICAL DISCECTOMY AND FUSION               (GLOBUS HEDRON);  Surgeon: Jodeen Munch, MD;                Location: [facility] OR[facility];  Service: Neurosurgery;  Laterality:               N/A; 01/20/2022: CERVICAL WOUND DEBRIDEMENT; N/A     Comment:  Procedure: posterior wound debridment;  Surgeon:               Jodeen Munch, MD;  Location: [facility];  Service:               Neurosurgery;  Laterality: N/A; No date: CESAREAN SECTION     Comment:  X3 2000, 2001, 2009 No date: DILATION AND CURETTAGE OF UTERUS 01/07/2022: POSTERIOR CERVICAL LAMINECTOMY; N/A     Comment:  Procedure: OPEN C5-7 POSTERIOR DECOMPRESSION;  Surgeon:               Jodeen Munch, MD;  Location: [facility];  Service:               Neurosurgery;  Laterality: N/A; 2000: TONSILLECTOMY No date: WISDOM TOOTH EXTRACTION BMI    Body Mass Index: 35.28 kg/m     Reproductive/Obstetrics negative OB ROS                             Anesthesia Physical Anesthesia Plan  ASA: 3  Anesthesia Plan: General ETT   Post-op Pain Management:    Induction:   PONV Risk Score and Plan: 4 or greater  Airway Management Planned:   Additional Equipment:   Intra-op Plan:   Post-operative Plan:   Informed Consent: I have reviewed the patients History and Physical, chart, labs and discussed the procedure including the risks, benefits and alternatives for the proposed anesthesia with the patient or authorized representative who has indicated his/her understanding and acceptance.     Dental Advisory Given  Plan Discussed with: CRNA  Anesthesia Plan Comments:        Anesthesia Quick Evaluation

## 2024-03-15 NOTE — Plan of Care (Signed)
   Problem: Education: Goal: Knowledge of General Education information will improve Description Including pain rating scale, medication(s)/side effects and non-pharmacologic comfort measures Outcome: Progressing

## 2024-03-15 NOTE — Anesthesia Procedure Notes (Addendum)
 Procedure Name: Intubation Date/Time: 03/15/2024 9:36 AM  Performed by: Niki Barter, CRNAPre-anesthesia Checklist: Patient identified, Emergency Drugs available, Suction available and Patient being monitored Patient Re-evaluated:Patient Re-evaluated prior to induction Oxygen Delivery Method: Circle system utilized Preoxygenation: Pre-oxygenation with 100% oxygen Induction Type: IV induction Ventilation: Mask ventilation without difficulty Laryngoscope Size: McGrath and 3 Grade View: Grade I Tube type: Oral Tube size: 6.5 mm Number of attempts: 1 Airway Equipment and Method: Stylet and Bite block Placement Confirmation: ETT inserted through vocal cords under direct vision, positive ETCO2 and breath sounds checked- equal and bilateral Secured at: 21 cm Tube secured with: Tape Dental Injury: Teeth and Oropharynx as per pre-operative assessment  Comments: Strict neutral positioned maintained throughout w/o incident, pt reports comfort w/intubation position prior to induction-TFH CRNA

## 2024-03-15 NOTE — Discharge Instructions (Signed)
 Your surgeon has performed an operation on your cervical spine to relieve pressure on one or more nerves. Many times, patients feel better immediately after surgery and can "overdo it." Even if you feel well, it is important that you follow these activity guidelines. If you do not let your neck heal properly from the surgery, you can increase the chance of hardware complications and/or return of your symptoms. The following are instructions to help in your recovery once you have been discharged from the hospital.  Do not use NSAIDs for 3 months after surgery.  *Regarding compression stockings-  Please wear day and night until you are walking a couple hundred feet three times a day.   Activity    No bending, lifting, or twisting ("BLT"). Avoid lifting objects heavier than 10 pounds (gallon milk jug).  Where possible, avoid household activities that involve lifting, bending, pushing, or pulling such as laundry, vacuuming, grocery shopping, and childcare. Try to arrange for help from friends and family for these activities while your neck heals.  Increase physical activity slowly as tolerated.  Taking short walks is encouraged, but avoid strenuous exercise. Do not jog, run, bicycle, lift weights, or participate in any other exercises unless specifically allowed by your doctor. Avoid prolonged sitting, including car rides.  Talk to your doctor before resuming sexual activity.  You should not drive until cleared by your doctor.  Until released by your doctor, you should not return to work or school.  You should rest at home and let your body heal.   You may shower three days after your surgery.  After showering, lightly dab your incision dry. Do not take a tub bath or go swimming for 3 weeks, or until approved by your doctor at your follow-up appointment.  If you smoke, we strongly recommend that you quit.  Smoking has been proven to interfere with normal healing in your back and will dramatically  reduce the success rate of your surgery. Please contact [facility] (800-QUIT-NOW) and use the resources at www.[facility].com for assistance in stopping smoking.  Surgical Incision   If you have a dressing on your incision, you may remove it three days after your surgery. Keep your incision area clean and dry.  Your incision was closed with Dermabond glue. The glue should begin to peel away within about a week.  Diet            You may return to your usual diet. Be sure to stay hydrated.  When to Contact Us   Although your surgery and recovery will likely be uneventful, you may have some residual numbness, aches, and pains in your back and/or legs. This is normal and should improve in the next few weeks.  However, should you experience any of the following, contact us  immediately: New numbness or weakness Pain that is progressively getting worse, and is not relieved by your pain medications or rest Bleeding, redness, swelling, pain, or drainage from surgical incision Chills or flu-like symptoms Fever greater than 101.0 F (38.3 C) Problems with bowel or bladder functions Difficulty breathing or shortness of breath Warmth, tenderness, or swelling in your calf  Contact Information How to contact us :  If you have any questions/concerns before or after surgery, you can reach us  at [phone], or you can send a mychart message. We can be reached by phone or mychart 8am-4pm, Monday-Friday.  *Please note: Calls after 4pm are forwarded to a third party answering service. Mychart messages are not routinely monitored during evenings, weekends, and  holidays. Please call our office to contact the answering service for urgent concerns during non-business hours.

## 2024-03-15 NOTE — Transfer of Care (Signed)
 Immediate Anesthesia Transfer of Care Note  Patient: Eileen Chaney  Procedure(s) Performed: POSTERIOR CERVICAL FUSION/FORAMINOTOMY LEVEL 4  Patient Location: PACU  Anesthesia Type:General  Level of Consciousness: awake, oriented, and patient cooperative  Airway & Oxygen Therapy: Patient Spontanous Breathing and Patient connected to face mask oxygen  Post-op Assessment: Report given to RN and Post -op Vital signs reviewed and stable  Post vital signs: Reviewed and stable  Last Vitals:  Vitals Value Taken Time  BP 114/63 03/15/24 14:00  Temp    Pulse 106 03/15/24 14:13  Resp 22 03/15/24 14:10  SpO2 100 % 03/15/24 14:13  Vitals shown include unfiled device data.  Last Pain:  Vitals:   03/15/24 0836  TempSrc: Tympanic  PainSc: 4          Complications: No notable events documented.

## 2024-03-15 NOTE — Op Note (Signed)
 Indications: [name] is a 48 y.o. female with G95.9 Cervical myelopathy , M48.02 Cervical spinal stenosis , M54.12 Cervical radiculopathy   Due to ongoing symptoms, surgical intervention was recommended.    Findings: bony overgrowth at multiple levels requiring destabilization for full decompression  Preoperative Diagnosis: Cervical myelopathy Postoperative Diagnosis: same   EBL: 300 ml IVF: see anesthesia record Drains: one Disposition: Extubated and Stable to PACU Complications: none  No foley catheter was placed.   Preoperative Note:   Risks of surgery discussed include: infection, bleeding, stroke, coma, death, paralysis, CSF leak, nerve/spinal cord injury, numbness, tingling, weakness, complex regional pain syndrome, recurrent stenosis and/or disc herniation, vascular injury, development of instability, neck/back pain, need for further surgery, persistent symptoms, development of deformity, and the risks of anesthesia. The patient understood these risks and agreed to proceed.  Operative Note:   OPERATIVE PROCEDURE:  1. Posterior Segmental Instrumentation C3-7 using Nuvasive Reline C 2. Posterolateral arthrodesis from C5-7 3. Cervical Laminectomy from C4-C6 for decompression of the spinal cord with bilateral foraminotomies and facetectomies at C3/4, C4/5, C5/6, and left foraminotomy at C6/7 4. Harvesting of autograft via the same incision 5. Use of flouroscopy   OPERATIVE PROCEDURE:  After induction of general anesthesia, the patient was placed in the prone position on the operative table.  A midline incision was then planned using fluoroscopy.  A timeout was performed, and antibiotics given.  Next, the posterior cervical region was prepped and draped in the usual sterile fashion. The incision was injected with local anesthetic, then opened sharply. A subperiosteal dissection was then carried out to expose the posterior elements from C2 and C7, with careful  attention paid to maintaining the C2/3 facet capsule.  After satisfactory exposure had been obtained, our attention was turned to placement of lateral mass screws.  On each side, lateral mass screws were placed at each level using a modified Magerl technique. Briefly, a pilot hole was drilled in the lateral mass using the high-speed drill on each side.  Next, a drill was used to drill a tract in each lateral mass to 14mm. A balltip probe was used to confirm lack of breach. We then placed 3.5x 14 mm screws at C3, 3.5x 14 mm screws at C4, 3.5x 16 mm screws at C5, 3.5x 16 mm screws at C6, and 3.5x 16 mm screws at C7.   After placement of the lateral mass screws, the high speed drill was used to drill trough laminectomies from C4 to C6. The spinous processes were disconnected from the intraspinous ligaments at C3/4 and C6/7, then the ligamentum flavum was carefully divided at C2/3 and C6/7 using a Kerrison punch and upgoing curettes. The laminae and spinous processes were then removed en bloc. The decompression was completed using a Kerrison punch until the spinal cord was adequately decompressed.  The leading edge of C7 and the inferior margin of C3 were also felt to ensure adequate decompression.   After central decompression was performed, we then moved to performing facetectomy and foraminotomies.  These were performed bilaterally at C3-4, C4-5, and C5-6.  At C6-7, a left-sided foraminotomy was performed.  Briefly, each foraminotomy was performed by using the high-speed drill to remove a portion of the inferior articulating process and the superior articulating process until only a thin cortical rim of bone was present over the posterior portion of the neuroforamen.  The nerve root at each level was identified and carefully dissected free.  It was carefully protected, then the remainder of the  foramen was unroofed using 1 mm Kerrison punch.  After the nerve was fully decompressed, a nerve hook was used to  confirm the adequacy of decompression.  This was performed successively 7 different times at the levels mentioned above.  After decompression was complete,  rods were measured and shaped, then secured to the screws according to manufacturer's specifications. Final AP and lateral radiographs were taken.  The wound was copiously irrigated and hemostasis was achieved.  Using high-speed drill, the lateral margin of the lateral masses was gently decorticated.  The bone harvested from the laminectomy was processed and placed posterolaterally and in the facet joints to aid in arthrodesis.  A Hemovac drain was then placed in the wound deep to the fascia.   The wound was closed in a multilayer fashion using interrupted 0 and 2-0 Vicryl sutures.  The final skin edges were reapproximated using a 3-0 monocryl.  A wound vac was placed.  After closure, the patient was flipped supine. Patient was then handed back over to anesthesia.  All counts were correct at the conclusion of the procedure.  Neurological monitoring was used throughout, and there were no changes.  Anastacio Karvonen PA acted as an Designer, television/film set throughout the case. An assistant was required for this procedure due to the complexity.  The assistant provided assistance in tissue manipulation and suction, and was required for the successful and safe performance of the procedure. I performed the critical portions of the procedure.   Jodeen Munch MD

## 2024-03-15 NOTE — Interval H&P Note (Signed)
 History and Physical Interval Note:  03/15/2024 8:56 AM  Eileen Chaney  has presented today for surgery, with the diagnosis of G95.9 Cervical myelopathy    M48.02 Cervical spinal stenosis  M54.12 Cervical radiculopathy.  The various methods of treatment have been discussed with the patient and family. After consideration of risks, benefits and other options for treatment, the patient has consented to  Procedure(s) with comments: POSTERIOR CERVICAL FUSION/FORAMINOTOMY LEVEL 4 (N/A) - C4-7 Posterior Spinal Decompression C4-7 Posterior Spinal Instrumentation C5-7 Fusion as a surgical intervention.  The patient's history has been reviewed, patient examined, no change in status, stable for surgery.  I have reviewed the patient's chart and labs.  Questions were answered to the patient's satisfaction.    Heart sounds normal no MRG. Chest Clear to Auscultation Bilaterally.  Keishana Klinger

## 2024-03-16 ENCOUNTER — Encounter: Payer: Self-pay | Admitting: Neurosurgery

## 2024-03-16 ENCOUNTER — Inpatient Hospital Stay

## 2024-03-16 MED ORDER — HYDROMORPHONE HCL 2 MG PO TABS: 1.0000 mg | ORAL_TABLET | ORAL | Status: DC | PRN

## 2024-03-16 MED ORDER — METHOCARBAMOL 1000 MG/10ML IJ SOLN: 500.0000 mg | Freq: Four times a day (QID) | INTRAMUSCULAR | Status: DC

## 2024-03-16 MED ORDER — METHOCARBAMOL 500 MG PO TABS
1000.0000 mg | ORAL_TABLET | Freq: Four times a day (QID) | ORAL | Status: DC
  Administered 2024-03-16 – 2024-03-19 (×8): 1000 mg via ORAL
  Filled 2024-03-16 (×8): qty 2

## 2024-03-16 MED ORDER — DIPHENHYDRAMINE HCL 25 MG PO CAPS
25.0000 mg | ORAL_CAPSULE | Freq: Four times a day (QID) | ORAL | Status: DC | PRN
Start: 2024-03-16 — End: 2024-03-19
  Administered 2024-03-18: 25 mg via ORAL
  Filled 2024-03-16: qty 1

## 2024-03-16 MED ORDER — METHOCARBAMOL 1000 MG/10ML IJ SOLN
500.0000 mg | Freq: Four times a day (QID) | INTRAMUSCULAR | Status: DC
  Administered 2024-03-18 – 2024-03-19 (×3): 500 mg via INTRAVENOUS
  Filled 2024-03-16 (×3): qty 10

## 2024-03-16 MED ORDER — PREGABALIN 75 MG PO CAPS
75.0000 mg | ORAL_CAPSULE | Freq: Three times a day (TID) | ORAL | Status: DC
  Administered 2024-03-16: 75 mg via ORAL
  Filled 2024-03-16: qty 1

## 2024-03-16 MED ORDER — DEXAMETHASONE SODIUM PHOSPHATE 10 MG/ML IJ SOLN
8.0000 mg | Freq: Four times a day (QID) | INTRAMUSCULAR | Status: AC
  Administered 2024-03-16 – 2024-03-17 (×4): 8 mg via INTRAVENOUS
  Filled 2024-03-16 (×4): qty 0.8

## 2024-03-16 MED ORDER — HYDROMORPHONE HCL 2 MG PO TABS
2.0000 mg | ORAL_TABLET | ORAL | Status: DC | PRN
  Administered 2024-03-16 (×2): 2 mg via ORAL
  Filled 2024-03-16 (×2): qty 1

## 2024-03-16 MED ORDER — HYDROMORPHONE HCL 2 MG PO TABS
2.0000 mg | ORAL_TABLET | ORAL | Status: DC | PRN
  Administered 2024-03-17: 2 mg via ORAL
  Filled 2024-03-16: qty 1

## 2024-03-16 MED ORDER — HYDROMORPHONE HCL 2 MG PO TABS
4.0000 mg | ORAL_TABLET | ORAL | Status: DC | PRN
  Administered 2024-03-16 – 2024-03-19 (×10): 4 mg via ORAL
  Filled 2024-03-16 (×12): qty 2

## 2024-03-16 MED ORDER — PREGABALIN 75 MG PO CAPS
150.0000 mg | ORAL_CAPSULE | Freq: Three times a day (TID) | ORAL | Status: DC
  Administered 2024-03-16 – 2024-03-19 (×8): 150 mg via ORAL
  Filled 2024-03-16 (×8): qty 2

## 2024-03-16 MED ORDER — DIAZEPAM 5 MG PO TABS
5.0000 mg | ORAL_TABLET | Freq: Three times a day (TID) | ORAL | Status: DC | PRN
  Administered 2024-03-16 – 2024-03-19 (×6): 5 mg via ORAL
  Filled 2024-03-16 (×6): qty 1

## 2024-03-16 MED ORDER — METHOCARBAMOL 500 MG PO TABS
500.0000 mg | ORAL_TABLET | Freq: Four times a day (QID) | ORAL | Status: DC
  Administered 2024-03-16: 500 mg via ORAL
  Filled 2024-03-16 (×2): qty 1

## 2024-03-16 MED ORDER — DEXAMETHASONE SODIUM PHOSPHATE 4 MG/ML IJ SOLN
4.0000 mg | Freq: Four times a day (QID) | INTRAMUSCULAR | Status: AC
  Administered 2024-03-17 – 2024-03-18 (×4): 4 mg via INTRAVENOUS
  Filled 2024-03-16 (×4): qty 1

## 2024-03-16 MED ORDER — KETOROLAC TROMETHAMINE 15 MG/ML IJ SOLN
15.0000 mg | Freq: Four times a day (QID) | INTRAMUSCULAR | Status: AC
  Administered 2024-03-16 – 2024-03-17 (×4): 15 mg via INTRAVENOUS
  Filled 2024-03-16 (×4): qty 1

## 2024-03-16 NOTE — Evaluation (Signed)
 Occupational Therapy Evaluation Patient Details Name: Eileen Chaney MRN: [id number] DOB: Apr 20, 1976 Today's Date: 03/16/2024   History of Present Illness   Pt is a 48 year old female s/p Posterior Segmental Instrumentation C3-7, Posterolateral arthrodesis from C5-7, Cervical Laminectomy from C4-C6 for decompression of the spinal cord with bilateral foraminotomies and facetectomies at C3/4, C4/5, C5/6, and left foraminotomy at C6/7    PMH significant for C3-5 ACDF 2023; posterior cervical laminectomy C5-7 2024     Clinical Impressions Chart reviewed to date, pt greeted flat, supine in bed, alert and oriented x4, agreeable to OT evaluation. Pt is supine, reports she has not been up yet, also reporting L sided weakness/numbness (appears unchanged with position changes however- see further details below). Co tx with PT for mobility. PTA pt is MOD I-I in ADL/IADL, amb with no AD. MOD A required for bed mobility, c collar donned in sitting with MAX A, STS with Min-MOD A +2, step pivot transfer to bedside chair via HHA with MIN A +2. MAX A required for LB dressing. Neurosurgery PA in room post session. Pt left with PT and PA, all needs met. Educated pt re: discharge recommendations, at this time would recommend intensive therapy to return to indep PLOF. OT will continue to follow.      If plan is discharge home, recommend the following:   A lot of help with walking and/or transfers;A lot of help with bathing/dressing/bathroom;Supervision due to cognitive status     Functional Status Assessment   Patient has had a recent decline in their functional status and demonstrates the ability to make significant improvements in function in a reasonable and predictable amount of time.     Equipment Recommendations   BSC/3in1     Recommendations for Other Services   Rehab consult     Precautions/Restrictions   Precautions Precautions: Fall;Cervical Recall of Precautions/Restrictions:  Intact Required Braces or Orthoses: Cervical Brace Cervical Brace: For comfort Restrictions Weight Bearing Restrictions Per Provider Order: No     Mobility Bed Mobility Overal bed mobility: Needs Assistance Bed Mobility: Rolling, Sidelying to Sit Rolling: Min assist Sidelying to sit: Mod assist, HOB elevated            Transfers Overall transfer level: Needs assistance Equipment used: 2 person hand held assist Transfers: Sit to/from Stand, Bed to chair/wheelchair/BSC Sit to Stand: Min assist, Mod assist     Step pivot transfers: Min assist, +2 physical assistance, +2 safety/equipment            Balance Overall balance assessment: Needs assistance Sitting-balance support: Feet supported Sitting balance-Leahy Scale: Good     Standing balance support: Bilateral upper extremity supported, During functional activity Standing balance-Leahy Scale: Fair                             ADL either performed or assessed with clinical judgement   ADL Overall ADL's : Needs assistance/impaired     Grooming: Moderate assistance;Sitting           Upper Body Dressing : Maximal assistance;Sitting Upper Body Dressing Details (indicate cue type and reason): donn collar Lower Body Dressing: Maximal assistance;Total assistance;Bed level Lower Body Dressing Details (indicate cue type and reason): TEDs and socks Toilet Transfer: +2 for physical assistance;+2 for safety/equipment;Minimal assistance Toilet Transfer Details (indicate cue type and reason): step pivot via HHA simulated to bedside chair Toileting- Clothing Manipulation and Hygiene: Maximal assistance Toileting - Clothing Manipulation Details (indicate cue  type and reason): anticipate             Vision Patient Visual Report: No change from baseline       Perception         Praxis         Pertinent Vitals/Pain Pain Assessment Pain Assessment: 0-10 Pain Score: 10-Worst pain ever Pain  Location: neck Pain Descriptors / Indicators: Discomfort, Constant Pain Intervention(s): Limited activity within patient's tolerance, Premedicated before session, Repositioned     Extremity/Trunk Assessment Upper Extremity Assessment Upper Extremity Assessment: RUE deficits/detail;LUE deficits/detail LUE Deficits / Details: houler flexion to approx 80 degrees, elbow flexion 1/2 full AROM, limited wrist/hand/forearm; diminished sensation and pain when she tries to move it LUE Sensation: decreased light touch LUE Coordination: decreased fine motor;decreased gross motor   Lower Extremity Assessment Lower Extremity Assessment: Defer to PT evaluation   Cervical / Trunk Assessment Cervical / Trunk Assessment: Neck Surgery   Communication Communication Communication: No apparent difficulties   Cognition Arousal: Alert Behavior During Therapy: WFL for tasks assessed/performed Cognition: No apparent impairments                               Following commands: Intact       Cueing  General Comments   Cueing Techniques: Verbal cues  vss throughout; pt is pain limited; neurosurg PA in room at end of eval;drain/hemovac intact pre/post sessin   Exercises Other Exercises Other Exercises: edu re: role of OT, role of rehab, discharge recommendations   Shoulder Instructions      Home Living Family/patient expects to be discharged to:: Private residence Living Arrangements: Children Available Help at Discharge: Family;Available 24 hours/day Type of Home: House Home Access: Stairs to enter [employer] of Steps: 1+1 with L rail Entrance Stairs-Rails: Left Home Layout: Two level;Able to live on main level with bedroom/bathroom Alternate Level Stairs-Number of Steps: 20 Alternate Level Stairs-Rails: Can reach both (last 5 steps have L rail only)           Home Equipment: Cane - single point          Prior Functioning/Environment Prior Level of Function  : Independent/Modified Independent             Mobility Comments: indep amb community distances without an AD ADLs Comments: MOD I-I with ADL/IADL    OT Problem List: Decreased strength;Decreased activity tolerance;Decreased coordination;Decreased knowledge of use of DME or AE;Impaired sensation;Impaired UE functional use;Impaired balance (sitting and/or standing)   OT Treatment/Interventions: Self-care/ADL training;DME and/or AE instruction;Therapeutic activities;Balance training;Therapeutic exercise;Energy conservation;Patient/family education;Neuromuscular education      OT Goals(Current goals can be found in the care plan section)   Acute Rehab OT Goals Patient Stated Goal: improve function OT Goal Formulation: With patient/family Time For Goal Achievement: 03/30/24 Potential to Achieve Goals: Good ADL Goals Pt Will Perform Grooming: with modified independence;sitting;standing Pt Will Perform Lower Body Dressing: with modified independence;sit to/from stand;sitting/lateral leans Pt Will Transfer to Toilet: with modified independence;ambulating Pt Will Perform Toileting - Clothing Manipulation and hygiene: with modified independence;sit to/from stand;sitting/lateral leans   OT Frequency:  Min 3X/week    Co-evaluation PT/OT/SLP Co-Evaluation/Treatment: Yes Reason for Co-Treatment: To address functional/ADL transfers   OT goals addressed during session: ADL's and self-care      AM-PAC OT 6 Clicks Daily Activity     Outcome Measure Help from another person eating meals?: A Little Help from another person taking care of personal grooming?:  A Little Help from another person toileting, which includes using toliet, bedpan, or urinal?: A Lot Help from another person bathing (including washing, rinsing, drying)?: A Lot Help from another person to put on and taking off regular upper body clothing?: A Lot Help from another person to put on and taking off regular lower body  clothing?: A Lot 6 Click Score: 14   End of Session Nurse Communication: Mobility status;Patient requests pain meds  Activity Tolerance: Patient limited by pain Patient left: in chair;with call bell/phone within reach (with PT and neurosurgery PA present)  OT Visit Diagnosis: Other abnormalities of gait and mobility (R26.89);Muscle weakness (generalized) (M62.81)                Time: 4098-1191 OT Time Calculation (min): 31 min Charges:  OT General Charges $OT Visit: 1 Visit OT Evaluation $OT Eval High Complexity: 1 High  Gerre Kraft, OTD OTR/L  03/16/24, 12:34 PM

## 2024-03-16 NOTE — Plan of Care (Signed)
  Problem: Elimination: Goal: Will not experience complications related to bowel motility Outcome: Progressing Goal: Will not experience complications related to urinary retention Outcome: Progressing   Problem: Pain Managment: Goal: General experience of comfort will improve and/or be controlled Outcome: Progressing   Problem: Safety: Goal: Ability to remain free from injury will improve Outcome: Progressing   Problem: Education: Goal: Ability to verbalize activity precautions or restrictions will improve Outcome: Progressing

## 2024-03-16 NOTE — Progress Notes (Signed)
  Inpatient Rehab Admissions Coordinator :  Per therapy recommendations, patient was screened for CIR candidacy by Ottie Glazier RN MSN.  At this time patient appears to be a potential candidate for CIR. I will place a rehab consult per protocol for full assessment. Please call me with any questions.  Ottie Glazier RN MSN Admissions Coordinator [phone]

## 2024-03-16 NOTE — Progress Notes (Addendum)
   Neurosurgery Progress Note  History: Eileen Chaney is s/p C3-7 PSF., C4-6 decompression  POD1: Pt experiencing acute left arm weakness overnight with associated pain and numbness which is new. Pain is uncontrolled  Physical Exam: Vitals:   03/16/24 0420 03/16/24 0757  BP: 109/69 120/78  Pulse: 92 (!) 101  Resp: 18 17  Temp: 97.8 F (36.6 C) 97.7 F (36.5 C)  SpO2: 99% 100%    AA Ox3 CNI  Strength:5/5 throughout BLE  Side Biceps Triceps Deltoid Interossei Grip Wrist Ext. Wrist Flex.  R 4+ 4+ 4 4- 4- 4- 4-  L 3 3 2  4- 4- 4- 4-    HV output 20 since surgery. Appears to be another 30-54mL in container.  Incision covered with wound vac  Data:  Other tests/results: UA. 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 Changed PO to diluadid (ok with patient to take benadryl  PRN for itching), started short steroid taper.  - STAT MRI C spine ordered  - DVT prophylaxis - PTOT  Anastacio Karvonen PA-C Department of Neurosurgery   This is an addendum to above.  I have seen and examined Eileen Chaney.  Due to concern about her presentation with severe left arm pain and weakness this morning, we pursued an MRI scan.  This showed that her central stenosis from C4-C7 was improved.  Additionally, there was no evidence of a compressive epidural lesion that might explain her symptoms.  On further evaluation on evening rounds, her symptoms seem more concerning for left-sided C5-7 radicular dysfunction.  There is some concern on her MRI scan that there is persistent neuroforaminal stenosis, but I discussed with the patient that this was evaluated by direct visualization and palpation during surgery yesterday.  Additionally, there is metallic artifact in the area of the foramina on her MRI scan  which could lead to an overcall of the foraminal stenosis.  As such, I am less concerned with the possibility of persistent stenosis.  I was unable to fully evaluate the placement of the lateral mass screws to confirm that there was no area in which the lateral mass screws might interact with the nerve roots.  As such, I pursued a CT scan.  I have reviewed this with the radiologist Dr. Ethelle Herb who agrees with my assessment that there is no area in which there is concern for any compression of her nerve roots by the surgical implants.  As we have now eliminated the possibility of an epidural fluid collection or implant mediated radicular compression, I feel that it is safe to conclude that her ongoing radicular dysfunction is more likely due to nerve root irritation and manipulation during surgery.  I do not think that surgical exploration is indicated at this time.  I have increased her neuropathic pain medications and change some of her other pain medications to try to achieve more stable pain relief this evening.  I have discussed this in detail with the patient, her partner, and her parents.  I answered their questions to the best of my ability.

## 2024-03-16 NOTE — Evaluation (Signed)
 Physical Therapy Evaluation Patient Details Name: Eileen Chaney MRN: [id number] DOB: [birth year] Today's Date: 03/16/2024  History of Present Illness  Pt is a 48 year old female s/p Posterior Segmental Instrumentation C3-7, Posterolateral arthrodesis from C5-7, Cervical Laminectomy from C4-C6 for decompression of the spinal cord with bilateral foraminotomies and facetectomies at C3/4, C4/5, C5/6, and left foraminotomy at C6/7    PMH significant for C3-5 ACDF 2023; posterior cervical laminectomy C5-7 2024   Clinical Impression  Pt upright at the EOB upon arrival to the room with OT assisting the pt.  Pt reporting significant weakness and pain in the L UE following surgery and noting that she cannot perform full ROM and is unable to demonstrate full ROM when asked to do so.  Pt received co-tx for mobility with OT/PT with pt being min-modA +2 for step pivot transfer to the recliner utilizing HHA from therapists.  Pt LE's are performing well and demonstrate 4/5 strength.  Pa from neurosurgery in room at conclusion of the session.  Pt left with all needs met and call bell within reach, along with PA and family in room.        If plan is discharge home, recommend the following: A lot of help with walking and/or transfers;A lot of help with bathing/dressing/bathroom;Assist for transportation;Help with stairs or ramp for entrance;Assistance with cooking/housework   Can travel by private vehicle        Equipment Recommendations Other (comment) (TBD at next venue)  Recommendations for Other Services       Functional Status Assessment Patient has had a recent decline in their functional status and demonstrates the ability to make significant improvements in function in a reasonable and predictable amount of time.     Precautions / Restrictions Precautions Precautions: Fall;Cervical Recall of Precautions/Restrictions: Intact Required Braces or Orthoses: Cervical Brace Cervical Brace: For  comfort Restrictions Weight Bearing Restrictions Per Provider Order: No      Mobility  Bed Mobility               General bed mobility comments: Pt upright at EOB upon arrival to room and transferred to the recliner.    Transfers Overall transfer level: Needs assistance Equipment used: 2 person hand held assist Transfers: Sit to/from Stand, Bed to chair/wheelchair/BSC Sit to Stand: Min assist, Mod assist   Step pivot transfers: Min assist, +2 physical assistance, +2 safety/equipment       General transfer comment: pt with good technique, however very painful to mobilize and pt unable to functionally utilize the L UE.    Ambulation/Gait               General Gait Details: deferred at this time.  Stairs            Wheelchair Mobility     Tilt Bed    Modified Rankin (Stroke Patients Only)       Balance Overall balance assessment: Needs assistance Sitting-balance support: Feet supported Sitting balance-Leahy Scale: Good     Standing balance support: Bilateral upper extremity supported, During functional activity Standing balance-Leahy Scale: Fair                               Pertinent Vitals/Pain Pain Assessment Pain Assessment: 0-10 Pain Score: 10-Worst pain ever Pain Location: neck Pain Descriptors / Indicators: Discomfort, Constant Pain Intervention(s): Limited activity within patient's tolerance, Premedicated before session, Repositioned    Home Living Family/patient expects to be  discharged to:: Private residence Living Arrangements: Children Available Help at Discharge: Family;Available 24 hours/day Type of Home: House Home Access: Stairs to enter Entrance Stairs-Rails: Left Entrance Stairs-Number of Steps: 1+1 with L rail Alternate Level Stairs-Number of Steps: 20 Home Layout: Two level;Able to live on main level with bedroom/bathroom Home Equipment: Cane - single point      Prior Function Prior Level of  Function : Independent/Modified Independent             Mobility Comments: indep amb community distances without an AD ADLs Comments: MOD I-I with ADL/IADL     Extremity/Trunk Assessment   Upper Extremity Assessment Upper Extremity Assessment: Right hand dominant;RUE deficits/detail;LUE deficits/detail LUE Deficits / Details: shoulder flexion to approx 80 degrees, elbow flexion 1/2 full AROM, limited wrist/hand/forearm; diminished sensation and pain when she tries to move it LUE Sensation: decreased light touch LUE Coordination: decreased fine motor;decreased gross motor    Lower Extremity Assessment Lower Extremity Assessment: Generalized weakness    Cervical / Trunk Assessment Cervical / Trunk Assessment: Neck Surgery  Communication   Communication Communication: No apparent difficulties    Cognition Arousal: Alert Behavior During Therapy: WFL for tasks assessed/performed                             Following commands: Intact       Cueing Cueing Techniques: Verbal cues     General Comments General comments (skin integrity, edema, etc.): cuing utilized throughout; pt is pain limited; neurosurg PA in room at end of eval;drain/hemovac intact pre/post sessin    Exercises     Assessment/Plan    PT Assessment Patient needs continued PT services  PT Problem List Decreased strength;Decreased range of motion;Decreased activity tolerance;Decreased balance;Decreased mobility       PT Treatment Interventions DME instruction;Gait training;Functional mobility training;Therapeutic activities;Therapeutic exercise;Balance training;Neuromuscular re-education;Patient/family education    PT Goals (Current goals can be found in the Care Plan section)  Acute Rehab PT Goals Patient Stated Goal: to improve feeling and strength in the L UE and get back home. PT Goal Formulation: With patient/family Time For Goal Achievement: 03/30/24 Potential to Achieve Goals:  Good    Frequency 7X/week     Co-evaluation   Reason for Co-Treatment: To address functional/ADL transfers   OT goals addressed during session: ADL's and self-care       AM-PAC PT 6 Clicks Mobility  Outcome Measure Help needed turning from your back to your side while in a flat bed without using bedrails?: A Lot Help needed moving from lying on your back to sitting on the side of a flat bed without using bedrails?: A Lot Help needed moving to and from a bed to a chair (including a wheelchair)?: A Lot Help needed standing up from a chair using your arms (e.g., wheelchair or bedside chair)?: A Lot Help needed to walk in hospital room?: A Lot Help needed climbing 3-5 steps with a railing? : A Lot 6 Click Score: 12    End of Session   Activity Tolerance: Patient limited by pain Patient left: in chair;with chair alarm set;with family/visitor present (with neurosurgion PA in room.)   PT Visit Diagnosis: Unsteadiness on feet (R26.81);Other abnormalities of gait and mobility (R26.89);Muscle weakness (generalized) (M62.81);Difficulty in walking, not elsewhere classified (R26.2);Other symptoms and signs involving the nervous system (R29.898);Pain Pain - Right/Left: Left Pain - part of body: Arm    Time: 1001-1020 PT Time Calculation (min) (ACUTE ONLY):  19 min   Charges:   PT Evaluation $PT Eval Moderate Complexity: 1 Mod   PT General Charges $$ ACUTE PT VISIT: 1 Visit         Rozanna Corner, PT, DPT Physical Therapist - [facility]  03/16/24, 2:04 PM

## 2024-03-17 NOTE — Progress Notes (Addendum)
 Neurosurgery Progress Note  History: Eileen Chaney is s/p C3-7 PSF., C4-6 decompression  POD2: Pts left arm pain improved some this morning POD1: Pt experiencing acute left arm weakness overnight with associated pain and numbness which is new. Pain is uncontrolled  Physical Exam: Vitals:   03/17/24 0458 03/17/24 0826  BP: 122/61 116/74  Pulse: 92 93  Resp: 17 16  Temp: 97.6 F (36.4 C) 97.9 F (36.6 C)  SpO2: 98% 99%    AA Ox3 CNI  Strength:5/5 throughout BLE  Side Biceps Triceps Deltoid Interossei Grip Wrist Ext. Wrist Flex.  R 4+ 4+ 4 4- 4- 4- 4-  L 3 3 2  4- 4- 4- 4-    HV output recorded as 0 overnight  however there is clearly 30-40 of bloody drainage in HV Incision covered with wound vac  Data:  Other tests/results:  UA. Negative   03/16/24 MRI C spine FINDINGS: Alignment: Straightening of the normal cervical lordosis. No significant listhesis.   Vertebrae: Redemonstrated anterior cervical fusion spanning C3-C5. Interval decompressive laminectomy from C4-C6. Interval placement of posterior instrumented fusion spanning C3-C7. Modic type 1 degenerative endplate changes at C5-6 with mild discogenic edema. Vertebral body heights are maintained. No evidence of fracture.   Cord: Normal signal and morphology.   Posterior Fossa, vertebral arteries, paraspinal tissues: The visualized posterior fossa is unremarkable. Bilateral vertebral artery flow voids visualized. Postsurgical changes in the midline paraspinal soft tissues. There is edema throughout the subcutaneous tissues and paraspinal musculature of the cervical spine likely reflecting postsurgical changes. Postoperative fluid within the dorsal epidural soft tissues from C4-5 to C6-7 without significant impingement upon the thecal sac.   Disc levels:   C2-3: Diffuse disc bulge which indents the ventral thecal sac. Thickening of the ligamentum flavum. Moderate spinal canal stenosis. Bilateral facet  arthrosis. No significant foraminal stenosis.   C3-4: Disc osteophyte complex slightly eccentric to the left which indents the ventral thecal sac flattening of the ventral cervical cord. Thickening of the ligamentum flavum. Moderate to severe spinal canal stenosis. There is subtle cord compression at this level without definite cord signal abnormality. Bilateral facet arthrosis and uncovertebral hypertrophy. Severe bilateral foraminal stenosis.   C4-5: Postsurgical changes at this level. Mild chronic volume loss of the cord at this level without definite cord signal abnormality or cord compression. Bilateral facet arthrosis and uncovertebral hypertrophy. There is mild right and moderate left foraminal stenosis.   C5-6: Disc osteophyte complex eccentric to the left which indents the ventral thecal sac without contacting the spinal cord. Posterior decompression. No significant spinal canal stenosis. Bilateral facet arthrosis and uncovertebral hypertrophy. Severe bilateral foraminal stenosis.   C6-7: Disc osteophyte complex indents the ventral thecal sac with subtle flattening of the ventral cervical cord. Posterior decompression. Bilateral facet arthrosis and uncovertebral hypertrophy. Moderate to severe right and severe left foraminal stenosis.   C7-T1: Disc osteophyte complex indents the ventral thecal sac with mild flattening of the ventral cervical cord. Thickening of the ligamentum flavum. Moderate spinal canal stenosis. Bilateral facet arthrosis. Mild bilateral foraminal stenosis.   IMPRESSION: Interval postsurgical changes of posterior decompression from C4-C6 with posterior instrumented fusion spanning C3-C7.   Degenerative changes as above. Moderate to severe spinal canal stenosis at C3-4 with mild cord compression without definite cord signal abnormality.   Multilevel foraminal stenosis, greatest and severe bilaterally at C3-4 and C5-6 and on the left at C6-7.  Moderate to severe spinal canal stenosis on the right at C6-7.   Degenerative endplate changes  and mild discogenic edema at C6-7 which may contribute to neck pain.   Fluid along the dorsal aspect of the spinal canal from C4-5 to C6-7 which is likely postoperative. Surgical drain noted in this region.     Electronically Signed   By: Denny Flack M.D.   On: 03/16/2024 13:24  CT C spine 03/16/24 FINDINGS: Alignment: Alignment is grossly anatomic.   Skull base and vertebrae: Prior C3-4 and C4-5 ACDF, with stable appearance of the orthopedic hardware. Interval C4-C6 laminectomies, with posterior fusion hardware spanning C4 through C7. Intrapedicular screws are seen on the left from C3 through C7 and on the right at C3, C4, C6, and C7. There are lucencies through the superior aspects of the right C5 and C6 facets as well as the left C4, C5, C6, and C7 facets, likely ghost holes from attempted hardware placement. There are no acute displaced fractures.   Soft tissues and spinal canal: Postsurgical changes are seen within the midline soft tissues of posterior neck from recent decompressive laminectomy. Surgical drain is identified along the dorsal margin of the central canal from C4 through C6. Soft tissue gas and fluid are seen along the deep margins of the posterior incision, likely representing postoperative seroma or hematoma. This is better seen on recent MRI.   Disc levels: Prior C3-4 and C4-5 ACDF. Moderate spondylosis at C5-6 and C6-7. Adequate decompression of the central canal after laminectomy spanning C4 through C6.   Upper chest: Airway is patent.  Lung apices are clear.   Other: Reconstructed images demonstrate no additional findings.   IMPRESSION: 1. Stable C3-4 and C4-5 ACDF, with no change in orthopedic hardware. 2. Recent postsurgical changes from decompressive laminectomy and posterior fusion spanning C3 through C7, with anatomic alignment. 3. Subcutaneous  gas and fluid along the deep margin of the posterior midline incision, consistent with postoperative seroma or hematoma. Surgical drain is seen along the dorsal margin of the central canal. 4. No acute displaced fracture.     Electronically Signed   By: Bobbye Burrow M.D.   On: 03/16/2024 20:20    Assessment/Plan:  Eileen Chaney is a 48 y.o presenting with cervical myelopathy s/p C3-7 PSF., C4-6 decompression experiencing new left UE weakness post-op concerning for C5 palsy  - mobilize - pain control; increased Lyrica. Changed PO to diluadid (ok with patient to take benadryl  PRN for itching), started short steroid taper.  - MRI and CT scan ordered yesterday given new symptoms - will continue HV for now as it is unclear over what period of time the blood has accumulated. Will re-evaluate this afternoon  - DVT prophylaxis - PTOT  Anastacio Karvonen PA-C Department of Neurosurgery    Addendum to above.  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  I think the most likely diagnoses of her left arm weakness are neuropraxia versus radicular dysfunction due to manipulation during surgery.  Either way, I think she will recover over time.  Fortunately, she is somewhat better today after we changed her regimen.  Will continue to watch her and hope for improvement as she begins to work more with physical and Occupational Therapy.  It is possible that she will benefit from inpatient rehab.  She is currently being evaluated for this.

## 2024-03-17 NOTE — Plan of Care (Signed)

## 2024-03-17 NOTE — Progress Notes (Signed)
 Inpatient Rehab Admissions Coordinator:    I spoke with pt. Regarding potential CIR admit. She is interested and states her partner, daughter, and son can provide 24/7 assist at d/c if she does not reach independent goals. I will send case to insurance and pursue for admit.   Wandalee Gust, MS, CCC-SLP Rehab Admissions Coordinator  [phone] (celll) [phone] (office)

## 2024-03-17 NOTE — PMR Pre-admission (Signed)
 PMR Admission Coordinator Pre-Admission Assessment  Patient: Eileen Chaney is an 48 y.o., female MRN: [id number] DOB: 04-08-1976 Height: 5' 8 (172.7 cm) Weight: 105.2 kg  Insurance Information HMO:     PPO: yes     PCP:      IPA:      80/20:      OTHER:  PRIMARY: BCBS Comm PPO      Policy#: [id number]      Subscriber: pt CM Name: ***      Phone#: [phone]     Fax#: [phone]   Pre-Cert#: [id number] approval vis fax from 03/18/24 to 03/31/24 with updated due by 03/31/24      Employer:  Benefits:  Phone #:      Name:  Venson Ginger Date: 10/01/2023 - 09/29/2198 Deductible: $300 ($300 met) OOP Max: $3,500 ($2,161.57 met) CIR: 80% coverage, 20% co-insurance SNF: 80% coverage, 20% co-insurance, limited to 120 days/cal yr (120 remaining) Outpatient:  80% coverage, 20% co-insurance Home Health: 100% coverage DME: 80% coverage, 20% co-insurance Providers: in network   SECONDARY:       Policy#:      Phone#:    Artist:       Phone#:  The Data processing manager" for patients in Inpatient Rehabilitation Facilities with attached "Privacy Act Statement-Health Care Records" was provided and verbally reviewed with: Patient  Emergency Contact Information Contact Information     Name Relation Home Work Mobile   Vernon Daughter   [phone]   Damiana, Berrian Father   [phone]   Amil Balding   [phone]      Other Contacts   None on File     Current Medical History  Patient Admitting Diagnosis: Cervical Myelopathy  History of Present Illness: Eileen Chaney is a pleasant 48 y.o. 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 She has been followed by neurosurgery for cervical myelopathy due to stenosis. She has  She previously had  surgery for this a couple of years ago.  She now has severe stenosis that has recurred.  She has severe central stenosis at C4-5 and C5-6 with foraminal stenosis from C3-C7. Leading up to her admission, Pt. Had worsening symptoms of clinical myelopathy as shown by her dexterity changes, numbness and tingling in her hands, and balance changes. She presented to [facility] 03/15/24 fore elective posterior Segmental Instrumentation C3-7, Posterolateral arthrodesis from C5-7, Cervical Laminectomy from C4-C6 for decompression of the spinal cord with bilateral foraminotomies and facetectomies at C3/4, C4/5, C5/6, and left foraminotomy at C6/7. Pt. Seen by PT/OT and they recommended CIR to assist return to PLOF.   Patient's medical record from [facility] has been reviewed by the rehabilitation admission coordinator and physician.  Past Medical History  Past Medical History:  Diagnosis Date   Anxiety    Arthritis    Asthma    Cervical myelopathy with cervical radiculopathy (HCC)    Complication of anesthesia    with c sections had a had time getting her sedated   DDD (degenerative disc disease), cervical    Depression    Diabetes mellitus without complication (HCC)    type 2   Family history of adverse reaction to anesthesia    mother hard to wake up   Fibromyalgia    Hyperlipidemia    Hypertension  Idiopathic urticaria    Pneumonia    PONV (postoperative nausea and vomiting)    Undifferentiated inflammatory arthritis     Has the patient had major surgery during 100 days prior to admission? Yes  Family History   family history is not on file.  Current Medications  Current Facility-Administered Medications:    balanced salts (BSS) ophthalmic solution 15 mL, 15 mL, Irrigation, Once, Adams, Alston Jerry, MD   [COMPLETED] dexamethasone  (DECADRON ) injection 8 mg, 8 mg, Intravenous, Q6H, 8 mg at 03/17/24 0511 **FOLLOWED BY** dexamethasone  (DECADRON )  injection 4 mg, 4 mg, Intravenous, Q6H, Noble Bateman, PA, 4 mg at 03/17/24 1207   diazepam  (VALIUM ) tablet 5 mg, 5 mg, Oral, Q8H PRN, Jodeen Munch, MD, 5 mg at 03/17/24 1205   diphenhydrAMINE  (BENADRYL ) capsule 25 mg, 25 mg, Oral, Q6H PRN, Noble Bateman, PA   docusate sodium (COLACE) capsule 100 mg, 100 mg, Oral, BID, Noble Bateman, PA, 100 mg at 03/17/24 0913   enoxaparin (LOVENOX) injection 40 mg, 40 mg, Subcutaneous, Q24H, Noble Bateman, [place], 40 mg at 03/17/24 4098   fluticasone  (FLONASE ) 50 MCG/ACT nasal spray 2 spray, 2 spray, Each Nare, QHS, Noble Bateman, PA, 2 spray at 03/16/24 2129   folic acid (FOLVITE) tablet 1 mg, 1 mg, Oral, Daily, Noble Bateman, PA, 1 mg at 03/17/24 0913   hydrocortisone cream 1 % 1 Application, 1 Application, Topical, BID PRN, Noble Bateman, PA   HYDROmorphone  (DILAUDID ) tablet 2 mg, 2 mg, Oral, Q4H PRN, Jodeen Munch, MD   HYDROmorphone  (DILAUDID ) tablet 4 mg, 4 mg, Oral, Q4H PRN, Jodeen Munch, MD, 4 mg at 03/17/24 0912   hydrOXYzine  (ATARAX ) tablet 50 mg, 50 mg, Oral, QHS PRN, Noble Bateman, PA, 50 mg at 03/16/24 2316   loratadine  (CLARITIN ) tablet 10 mg, 10 mg, Oral, Daily, Noble Bateman, PA, 10 mg at 03/17/24 0913   magnesium citrate solution 1 Bottle, 1 Bottle, Oral, Once PRN, Noble Bateman, PA   menthol -cetylpyridinium (CEPACOL) lozenge 3 mg, 1 lozenge, Oral, PRN **OR** phenol (CHLORASEPTIC) mouth spray 1 spray, 1 spray, Mouth/Throat, PRN, Noble Bateman, PA   methocarbamol  (ROBAXIN ) tablet 1,000 mg, 1,000 mg, Oral, Q6H, 1,000 mg at 03/17/24 1205 **OR** methocarbamol  (ROBAXIN ) injection 500 mg, 500 mg, Intravenous, Q6H, Jodeen Munch, MD   methylphenidate (RITALIN) tablet 20 mg, 20 mg, Oral, q AM, Noble Bateman, PA, 20 mg at 03/17/24 0913   minoxidil (LONITEN) tablet 2.5 mg, 2.5 mg, Oral, q AM, Noble Bateman, PA, 2.5 mg at 03/17/24 0916   mometasone (ELOCON) 0.1 % cream 1  Application, 1 Application, Topical, Daily, Noble Bateman, [place], 1 Application at 03/17/24 1191   neomycin-bacitracin-polymyxin (NEOSPORIN) ointment, , Topical, PRN, Jodeen Munch, MD, Given at 03/15/24 2134   ondansetron  (ZOFRAN ) tablet 4 mg, 4 mg, Oral, Q6H PRN **OR** ondansetron  (ZOFRAN ) injection 4 mg, 4 mg, Intravenous, Q6H PRN, Noble Bateman, PA   polyethylene glycol (MIRALAX / GLYCOLAX) packet 17 g, 17 g, Oral, Daily PRN, Noble Bateman, PA   pregabalin (LYRICA) capsule 150 mg, 150 mg, Oral, TID, Jodeen Munch, MD, 150 mg at 03/17/24 0914   senna (SENOKOT) tablet 8.6 mg, 1 tablet, Oral, BID, Noble Bateman, PA, 8.6 mg at 03/17/24 0913   sodium chloride  flush (NS) 0.9 % injection 3 mL, 3 mL, Intravenous, Q12H, Noble Bateman, PA, 3 mL at 03/17/24 1217   sodium chloride  flush (NS) 0.9 % injection 3 mL, 3 mL, Intravenous,  PRN, Noble Bateman, PA   sorbitol 70 % solution 30 mL, 30 mL, Oral, Daily PRN, Noble Bateman, PA   spironolactone  (ALDACTONE ) tablet 100 mg, 100 mg, Oral, BID, Noble Bateman, PA, 100 mg at 03/17/24 0913   venlafaxine XR (EFFEXOR-XR) 24 hr capsule 75 mg, 75 mg, Oral, Q breakfast, Noble Bateman, [place], 75 mg at 03/17/24 0915   [START ON 03/20/2024] Vitamin D (Ergocalciferol) (DRISDOL) 1.25 MG (50000 UNIT) capsule 50,000 Units, 50,000 Units, Oral, Q Sat, Noble Bateman, [place]  Patients Current Diet:  Diet Order             Diet regular Room service appropriate? Yes; Fluid consistency: Thin  Diet effective now                   Precautions / Restrictions Precautions Precautions: Fall, Cervical Cervical Brace: For comfort Restrictions Weight Bearing Restrictions Per Provider Order: No   Has the patient had 2 or more falls or a fall with injury in the past year? No  Prior Activity Level Community (5-7x/wk): Pt active in the community PTA  Prior Functional Level Self Care: Did the patient need help bathing, dressing,  using the toilet or eating? Independent  Indoor Mobility: Did the patient need assistance with walking from room to room (with or without device)? Independent  Stairs: Did the patient need assistance with internal or external stairs (with or without device)? Independent  Functional Cognition: Did the patient need help planning regular tasks such as shopping or remembering to take medications? Independent  Patient Information Are you of Hispanic, Latino/a,or Spanish origin?: A. No, not of Hispanic, Latino/a, or Spanish origin What is your race?: A. White Do you need or want an interpreter to communicate with a doctor or health care staff?: 0. No  Patient's Response To:  Health Literacy and Transportation Is the patient able to respond to health literacy and transportation needs?: Yes Health Literacy - How often do you need to have someone help you when you read instructions, pamphlets, or other written material from your doctor or pharmacy?: Never In the past 12 months, has lack of transportation kept you from medical appointments or from getting medications?: No In the past 12 months, has lack of transportation kept you from meetings, work, or from getting things needed for daily living?: No  Home Assistive Devices / Equipment Home Equipment: Jeananne Mighty - single point  Prior Device Use: Indicate devices/aids used by the patient prior to current illness, exacerbation or injury? None of the above  Current Functional Level Cognition  Orientation Level: Oriented X4    Extremity Assessment (includes Sensation/Coordination)  Upper Extremity Assessment: LUE deficits/detail LUE Deficits / Details: not formally assessed as pt has requested sling, LUE in sling and tx session targeting optimizing mobility on this date; will continue to assess LUE Sensation: decreased light touch LUE Coordination: decreased fine motor, decreased gross motor  Lower Extremity Assessment: Generalized weakness     ADLs  Overall ADL's : Needs assistance/impaired Eating/Feeding: Set up, Sitting Grooming: Set up, Sitting Upper Body Dressing : Maximal assistance, Sitting Upper Body Dressing Details (indicate cue type and reason): adjust sling Lower Body Dressing: Maximal assistance, Bed level Lower Body Dressing Details (indicate cue type and reason): slides shoes on with SET UP seated on edge of bed Toilet Transfer: Contact guard assist, Ambulation, Minimal assistance Toilet Transfer Details (indicate cue type and reason): simulated Toileting- Clothing Manipulation and Hygiene: Maximal assistance Toileting - Clothing Manipulation Details (indicate  cue type and reason): anticipate Functional mobility during ADLs: Contact guard assist, Minimal assistance (approx 200' with no AD)    Mobility  Overal bed mobility: Needs Assistance Bed Mobility: Rolling, Sidelying to Sit Rolling: Min assist Sidelying to sit: Min assist, HOB elevated General bed mobility comments: Pt upright at EOB upon arrival to room and transferred to the recliner.    Transfers  Overall transfer level: Needs assistance Equipment used: 1 person hand held assist Transfers: Sit to/from Stand Sit to Stand: Min assist Bed to/from chair/wheelchair/BSC transfer type:: Step pivot Step pivot transfers: Min assist, +2 physical assistance, +2 safety/equipment General transfer comment: pt with good technique, however very painful to mobilize and pt unable to functionally utilize the L UE.    Ambulation / Gait / Stairs / Wheelchair Mobility  Ambulation/Gait General Gait Details: deferred at this time.    Posture / Balance Balance Overall balance assessment: Needs assistance Sitting-balance support: Feet supported Sitting balance-Leahy Scale: Good Standing balance support: Single extremity supported Standing balance-Leahy Scale: Fair    Special needs/care consideration Skin ***   Previous Home Environment (from acute therapy  documentation) Living Arrangements: Children  Lives With: Significant other Available Help at Discharge: Family, Available 24 hours/day Type of Home: House Home Layout: Two level, Able to live on main level with bedroom/bathroom Alternate Level Stairs-Rails: Can reach both (last 5 steps have L rail only) Alternate Level Stairs-Number of Steps: 20 Home Access: Stairs to enter Entrance Stairs-Rails: Left Entrance Stairs-Number of Steps: 1+1 with L rail Bathroom Shower/Tub: Health visitor: Standard Bathroom Accessibility: Yes How Accessible: Accessible via wheelchair Home Care Services: No  Discharge Living Setting Plans for Discharge Living Setting: Patient's home Type of Home at Discharge: House Discharge Home Layout: Able to live on main level with bedroom/bathroom Discharge Home Access: Stairs to enter Entrance Stairs-Rails: None Entrance Stairs-Number of Steps: 3 Discharge Bathroom Shower/Tub: Tub/shower unit Discharge Bathroom Toilet: Standard Discharge Bathroom Accessibility: No Does the patient have any problems obtaining your medications?: No  Social/Family/Support Systems Patient Roles: Partner, Other (Comment) Contact Information: Pt. partner and children can rotate to be with her 24/7 Caregiver Availability: 24/7 Discharge Plan Discussed with Primary Caregiver: Yes Is Caregiver In Agreement with Plan?: Yes Does Caregiver/Family have Issues with Lodging/Transportation while Pt is in Rehab?: No  Goals Patient/Family Goal for Rehab: PT/OT/SLP Min A Expected length of stay: 10-12 days Pt/Family Agrees to Admission and willing to participate: Yes Program Orientation Provided & Reviewed with Pt/Caregiver Including Roles  & Responsibilities: Yes  Decrease burden of Care through IP rehab admission: not anticipated  Possible need for SNF placement upon discharge: not anticipated  Patient Condition: I have reviewed medical records from [facility], spoken with CM, and patient. I discussed via phone for inpatient rehabilitation assessment.  Patient will benefit from ongoing PT, OT, and SLP, can actively participate in 3 hours of therapy a day 5 days of the week, and can make measurable gains during the admission.  Patient will also benefit from the coordinated team approach during an Inpatient Acute Rehabilitation admission.  The patient will receive intensive therapy as well as Rehabilitation physician, nursing, social worker, and care management interventions.  Due to safety, skin/wound care, disease management, medication administration, pain management, and patient education the patient requires 24 hour a day rehabilitation nursing.  The patient is currently *** with mobility and basic ADLs.  Discharge setting and therapy post discharge at home with home health is anticipated.  Patient has  agreed to participate in the Acute Inpatient Rehabilitation Program and will admit {Time; today/tomorrow:10263}.  Preadmission Screen Completed By:  Dorena Gander, 03/17/2024 1:28 PM ______________________________________________________________________   Discussed status with Dr. [name] on *** at *** and received approval for admission today.  Admission Coordinator:  Dorena Gander, CCC-SLP, time Aaron AasAlanna Hu ***   Assessment/Plan: Diagnosis: *** Does the need for close, 24 hr/day Medical supervision in concert with the patient's rehab needs make it unreasonable for this patient to be served in a less intensive setting? {yes_no_potentially:3041433} Co-Morbidities requiring supervision/potential complications: *** Due to {due ZO:1096045}, does the patient require 24 hr/day rehab nursing? {yes_no_potentially:3041433} Does the patient require coordinated care of a physician, rehab nurse, PT, OT, and SLP to address physical and functional deficits in the context of the above medical diagnosis(es)? {yes_no_potentially:3041433} Addressing deficits in the  following areas: {deficits:3041436} Can the patient actively participate in an intensive therapy program of at least 3 hrs of therapy 5 days a week? {yes_no_potentially:3041433} The potential for patient to make measurable gains while on inpatient rehab is {potential:3041437} Anticipated functional outcomes upon discharge from inpatient rehab: {functional outcomes:304600100} PT, {functional outcomes:304600100} OT, {functional outcomes:304600100} SLP Estimated rehab length of stay to reach the above functional goals is: *** Anticipated discharge destination: {anticipated dc setting:21604} 10. Overall Rehab/Functional Prognosis: {potential:3041437}   MD Signature: ***

## 2024-03-17 NOTE — Anesthesia Postprocedure Evaluation (Signed)
 Anesthesia Post Note  Patient: Eileen Chaney  Procedure(s) Performed: POSTERIOR CERVICAL FUSION/FORAMINOTOMY LEVEL 4  Patient location during evaluation: PACU Anesthesia Type: General Level of consciousness: awake and alert Pain management: pain level controlled Vital Signs Assessment: post-procedure vital signs reviewed and stable Respiratory status: spontaneous breathing, nonlabored ventilation, respiratory function stable and patient connected to nasal cannula oxygen Cardiovascular status: blood pressure returned to baseline and stable Postop Assessment: no apparent nausea or vomiting Anesthetic complications: no   No notable events documented.   Last Vitals:  Vitals:   03/17/24 0458 03/17/24 0826  BP: 122/61 116/74  Pulse: 92 93  Resp: 17 16  Temp: 36.4 C 36.6 C  SpO2: 98% 99%    Last Pain:  Vitals:   03/17/24 1829  TempSrc:   PainSc: 5                  Zula Hitch

## 2024-03-17 NOTE — Progress Notes (Signed)
 Occupational Therapy Treatment Patient Details Name: Eileen Chaney MRN: [id number] DOB: June 02, 1976 Today's Date: 03/17/2024   History of present illness Pt is a 48 year old female s/p Posterior Segmental Instrumentation C3-7, Posterolateral arthrodesis from C5-7, Cervical Laminectomy from C4-C6 for decompression of the spinal cord with bilateral foraminotomies and facetectomies at C3/4, C4/5, C5/6, and left foraminotomy at C6/7    PMH significant for C3-5 ACDF 2023; posterior cervical laminectomy C5-7 2024; post op course complicated by ongoing radicular dysfunction which may be due to nerve root irritation and manipulation during surgery per neurosugery   OT comments  Chart reviewed to date, pt greeted semi supine in bed, reporting improvements in LUE pain with sling on this date (which she reports she requested after amb to the bathroom this morning.) Will further assess LUE function in future treatment sessions. Pt is in agreement for OT tx session targeting improving functional activity tolerance for improved ADL performance. Improvements noted on this date with pt performing bed mobility with MIN A, STS with MIN A, amb with CGA-MIN A. Soft collar donned throughout (per pt request). SET UP for grooming/feeding tasks, MAX A for LB dressing. Pt is making progress towards goals, discharge recommendation remains appropriate. OT will continue to follow.      If plan is discharge home, recommend the following:  A lot of help with walking and/or transfers;A lot of help with bathing/dressing/bathroom;Supervision due to cognitive status   Equipment Recommendations  BSC/3in1;Tub/shower seat    Recommendations for Other Services      Precautions / Restrictions Precautions Precautions: Fall;Cervical Recall of Precautions/Restrictions: Intact Required Braces or Orthoses: Cervical Brace Cervical Brace: For comfort       Mobility Bed Mobility Overal bed mobility: Needs Assistance Bed  Mobility: Rolling, Sidelying to Sit Rolling: Min assist Sidelying to sit: Min assist, HOB elevated            Transfers Overall transfer level: Needs assistance Equipment used: 1 person hand held assist Transfers: Sit to/from Stand Sit to Stand: Min assist                 Balance Overall balance assessment: Needs assistance Sitting-balance support: Feet supported Sitting balance-Leahy Scale: Good     Standing balance support: Single extremity supported Standing balance-Leahy Scale: Fair                             ADL either performed or assessed with clinical judgement   ADL Overall ADL's : Needs assistance/impaired Eating/Feeding: Set up;Sitting   Grooming: Set up;Sitting           Upper Body Dressing : Maximal assistance;Sitting Upper Body Dressing Details (indicate cue type and reason): adjust sling Lower Body Dressing: Maximal assistance;Bed level Lower Body Dressing Details (indicate cue type and reason): slides shoes on with SET UP seated on edge of bed Toilet Transfer: Contact guard assist;Ambulation;Minimal assistance Toilet Transfer Details (indicate cue type and reason): simulated         Functional mobility during ADLs: Contact guard assist;Minimal assistance (approx 200' with no AD)      Extremity/Trunk Assessment Upper Extremity Assessment Upper Extremity Assessment: LUE deficits/detail LUE Deficits / Details: not formally assessed as pt has requested sling, LUE in sling and tx session targeting optimizing mobility on this date; will continue to assess            Vision       Perception     Praxis  Communication Communication Communication: No apparent difficulties   Cognition Arousal: Alert Behavior During Therapy: WFL for tasks assessed/performed Cognition: No apparent impairments                               Following commands: Intact        Cueing   Cueing Techniques: Verbal cues   Exercises Other Exercises Other Exercises: edu re: role of OT, role of rehab, discharge recommendations, positioning for improved comfort    Shoulder Instructions       General Comments wound vac/drain intact pre/post session    Pertinent Vitals/ Pain       Pain Assessment Pain Assessment: 0-10 Pain Score: 10-Worst pain ever Pain Location: 10/10 L arm with positioning/mobility Pain Descriptors / Indicators: Discomfort, Constant Pain Intervention(s): Monitored during session, Premedicated before session, Repositioned, Limited activity within patient's tolerance  Home Living                                          Prior Functioning/Environment              Frequency  Min 3X/week        Progress Toward Goals  OT Goals(current goals can now be found in the care plan section)  Progress towards OT goals: Progressing toward goals  Acute Rehab OT Goals Time For Goal Achievement: 03/30/24  Plan      Co-evaluation                 AM-PAC OT 6 Clicks Daily Activity     Outcome Measure   Help from another person eating meals?: None Help from another person taking care of personal grooming?: None Help from another person toileting, which includes using toliet, bedpan, or urinal?: A Lot Help from another person bathing (including washing, rinsing, drying)?: A Lot Help from another person to put on and taking off regular upper body clothing?: A Lot Help from another person to put on and taking off regular lower body clothing?: A Lot 6 Click Score: 16    End of Session    OT Visit Diagnosis: Other abnormalities of gait and mobility (R26.89);Muscle weakness (generalized) (M62.81)   Activity Tolerance Patient tolerated treatment well   Patient Left in bed;with call bell/phone within reach   Nurse Communication          Time: 1610-9604 OT Time Calculation (min): 37 min  Charges: OT General Charges $OT Visit: 1 Visit OT  Treatments $Self Care/Home Management : 23-37 mins $Therapeutic Activity: 23-37 mins  Gerre Kraft, OTD OTR/L  03/17/24, 1:15 PM

## 2024-03-17 NOTE — Progress Notes (Signed)
 Physical Therapy Treatment Patient Details Name: Eileen Chaney MRN: [id number] DOB: 1976/07/05 Today's Date: 03/17/2024   History of Present Illness Pt is a 48 year old female s/p Posterior Segmental Instrumentation C3-7, Posterolateral arthrodesis from C5-7, Cervical Laminectomy from C4-C6 for decompression of the spinal cord with bilateral foraminotomies and facetectomies at C3/4, C4/5, C5/6, and left foraminotomy at C6/7    PMH significant for C3-5 ACDF 2023; posterior cervical laminectomy C5-7 2024; post op course complicated by ongoing radicular dysfunction which may be due to nerve root irritation and manipulation during surgery per neurosugery    PT Comments  Pt received in Semi-Fowler's position and agreeable to therapy.  Pt with L arm in sling upon arrival and noting that her pain was much more manageable with it being in the sling.  Pt transferred to the EOB with good technique during bed mobility.  Pt noted the pain to be slightly better in standing, so she elected to stand soon after getting to the EOB.  Pt noted to be in increased pain upon standing, and sling was adjusted to assist.  Pt still ultimately in a lot more pain throughout the ambulation around the nursing station.  Pt required frequent standing rest breaks propped on hallway railing.  Pt still able to complete full lap around the nursing station, but in a lot of pain.  Nursing notified of pain and request of pain meds before returning to the bed.  Pt was left with all needs met and call bell within reach.     If plan is discharge home, recommend the following: A lot of help with walking and/or transfers;A lot of help with bathing/dressing/bathroom;Assist for transportation;Help with stairs or ramp for entrance;Assistance with cooking/housework   Can travel by private vehicle        Equipment Recommendations  Other (comment) (TBD at next venue)    Recommendations for Other Services       Precautions / Restrictions  Precautions Precautions: Fall;Cervical Recall of Precautions/Restrictions: Intact Required Braces or Orthoses: Cervical Brace Cervical Brace: For comfort     Mobility  Bed Mobility Overal bed mobility: Needs Assistance Bed Mobility: Rolling, Sidelying to Sit Rolling: Supervision Sidelying to sit: HOB elevated, Supervision            Transfers Overall transfer level: Needs assistance Equipment used: None Transfers: Sit to/from Stand Sit to Stand: Contact guard assist           General transfer comment: painful mobilization, noting the pain to be the most in the L GHJ.    Ambulation/Gait Ambulation/Gait assistance: Contact guard assist, Min assist Gait Distance (Feet): 160 Feet Assistive device: None Gait Pattern/deviations: Step-through pattern, Staggering left, Staggering right, Drifts right/left Gait velocity: decreased     General Gait Details: pt with increased difficulty ambulating today due to the pain and noting a reduction in the overall strength of the LE's compared to session with OT.  Pt had to take frequent rest breaks with use of the handrail before continuing.  RN notified of pt's request for pain medication.   Stairs             Wheelchair Mobility     Tilt Bed    Modified Rankin (Stroke Patients Only)       Balance Overall balance assessment: Needs assistance Sitting-balance support: Feet supported Sitting balance-Leahy Scale: Good     Standing balance support: Single extremity supported Standing balance-Leahy Scale: Fair  Communication Communication Communication: No apparent difficulties  Cognition Arousal: Alert Behavior During Therapy: WFL for tasks assessed/performed                             Following commands: Intact      Cueing Cueing Techniques: Verbal cues  Exercises      General Comments General comments (skin integrity, edema, etc.): wound vac/drain  intact pre/post session      Pertinent Vitals/Pain Pain Assessment Pain Assessment: Faces Faces Pain Scale: Hurts whole lot Pain Descriptors / Indicators: Discomfort, Constant Pain Intervention(s): Limited activity within patient's tolerance, Monitored during session, Repositioned, Patient requesting pain meds-RN notified    Home Living                          Prior Function            PT Goals (current goals can now be found in the care plan section) Acute Rehab PT Goals Patient Stated Goal: to improve feeling and strength in the L UE and get back home. PT Goal Formulation: With patient/family Time For Goal Achievement: 03/30/24 Potential to Achieve Goals: Good Progress towards PT goals: Progressing toward goals    Frequency    7X/week      PT Plan      Co-evaluation              AM-PAC PT 6 Clicks Mobility   Outcome Measure  Help needed turning from your back to your side while in a flat bed without using bedrails?: A Lot Help needed moving from lying on your back to sitting on the side of a flat bed without using bedrails?: A Lot Help needed moving to and from a bed to a chair (including a wheelchair)?: A Lot Help needed standing up from a chair using your arms (e.g., wheelchair or bedside chair)?: A Lot Help needed to walk in hospital room?: A Lot Help needed climbing 3-5 steps with a railing? : A Lot 6 Click Score: 12    End of Session   Activity Tolerance: Patient limited by pain Patient left: in bed;with bed alarm set   PT Visit Diagnosis: Unsteadiness on feet (R26.81);Other abnormalities of gait and mobility (R26.89);Muscle weakness (generalized) (M62.81);Difficulty in walking, not elsewhere classified (R26.2);Other symptoms and signs involving the nervous system (R29.898);Pain Pain - Right/Left: Left Pain - part of body: Arm     Time: 4098-1191 PT Time Calculation (min) (ACUTE ONLY): 25 min  Charges:    $Therapeutic Activity:  23-37 mins PT General Charges $$ ACUTE PT VISIT: 1 Visit                     Rozanna Corner, PT, DPT Physical Therapist - [facility]  03/17/24, 3:14 PM

## 2024-03-18 NOTE — Progress Notes (Signed)
 Writer called to room by patient to report Prevena alarming, Clinical research associate noted that device was not maintaining suction. Dressing reinforced; not effective; dressing then changed by writer and Bretta Camp, RN; device still not maintaining suction, Clinical research associate contacted MD [place] via secure chat, provider stated to turn off the device so that it doesn't alarm and that he would assess in the morning. Patient informed of plan.

## 2024-03-18 NOTE — Progress Notes (Signed)
 Patient ambulated around unit with SBA and tolerated well.

## 2024-03-18 NOTE — Plan of Care (Signed)

## 2024-03-18 NOTE — Progress Notes (Signed)
 Neurosurgery Progress Note  History: Eileen Chaney is s/p C3-7 PSF., C4-6 decompression  POD3: wound vac lost suction overnight pt reports improvement in the numbness in her left hand.  POD2: Pts left arm pain improved some this morning POD1: Pt experiencing acute left arm weakness overnight with associated pain and numbness which is new. Pain is uncontrolled  Physical Exam: Vitals:   03/18/24 0438 03/18/24 0839  BP: 114/69 113/66  Pulse: 90 100  Resp: 17 18  Temp:  97.9 F (36.6 C)  SpO2: 100% 98%    AA Ox3 CNI  Strength:5/5 throughout BLE  Side Biceps Triceps Deltoid Interossei Grip Wrist Ext. Wrist Flex.  R 4+ 4+ 4 4- 4- 4- 4-  L 3 3 2  4- 4- 4- 4-    HV output 60 overnight.  Data:  Other tests/results:  UA. Negative   03/16/24 MRI C spine FINDINGS: Alignment: Straightening of the normal cervical lordosis. No significant listhesis.   Vertebrae: Redemonstrated anterior cervical fusion spanning C3-C5. Interval decompressive laminectomy from C4-C6. Interval placement of posterior instrumented fusion spanning C3-C7. Modic type 1 degenerative endplate changes at C5-6 with mild discogenic edema. Vertebral body heights are maintained. No evidence of fracture.   Cord: Normal signal and morphology.   Posterior Fossa, vertebral arteries, paraspinal tissues: The visualized posterior fossa is unremarkable. Bilateral vertebral artery flow voids visualized. Postsurgical changes in the midline paraspinal soft tissues. There is edema throughout the subcutaneous tissues and paraspinal musculature of the cervical spine likely reflecting postsurgical changes. Postoperative fluid within the dorsal epidural soft tissues from C4-5 to C6-7 without significant impingement upon the thecal sac.   Disc levels:   C2-3: Diffuse disc bulge which indents the ventral thecal sac. Thickening of the ligamentum flavum. Moderate spinal canal stenosis. Bilateral facet arthrosis. No  significant foraminal stenosis.   C3-4: Disc osteophyte complex slightly eccentric to the left which indents the ventral thecal sac flattening of the ventral cervical cord. Thickening of the ligamentum flavum. Moderate to severe spinal canal stenosis. There is subtle cord compression at this level without definite cord signal abnormality. Bilateral facet arthrosis and uncovertebral hypertrophy. Severe bilateral foraminal stenosis.   C4-5: Postsurgical changes at this level. Mild chronic volume loss of the cord at this level without definite cord signal abnormality or cord compression. Bilateral facet arthrosis and uncovertebral hypertrophy. There is mild right and moderate left foraminal stenosis.   C5-6: Disc osteophyte complex eccentric to the left which indents the ventral thecal sac without contacting the spinal cord. Posterior decompression. No significant spinal canal stenosis. Bilateral facet arthrosis and uncovertebral hypertrophy. Severe bilateral foraminal stenosis.   C6-7: Disc osteophyte complex indents the ventral thecal sac with subtle flattening of the ventral cervical cord. Posterior decompression. Bilateral facet arthrosis and uncovertebral hypertrophy. Moderate to severe right and severe left foraminal stenosis.   C7-T1: Disc osteophyte complex indents the ventral thecal sac with mild flattening of the ventral cervical cord. Thickening of the ligamentum flavum. Moderate spinal canal stenosis. Bilateral facet arthrosis. Mild bilateral foraminal stenosis.   IMPRESSION: Interval postsurgical changes of posterior decompression from C4-C6 with posterior instrumented fusion spanning C3-C7.   Degenerative changes as above. Moderate to severe spinal canal stenosis at C3-4 with mild cord compression without definite cord signal abnormality.   Multilevel foraminal stenosis, greatest and severe bilaterally at C3-4 and C5-6 and on the left at C6-7. Moderate to severe  spinal canal stenosis on the right at C6-7.   Degenerative endplate changes and mild discogenic edema  at C6-7 which may contribute to neck pain.   Fluid along the dorsal aspect of the spinal canal from C4-5 to C6-7 which is likely postoperative. Surgical drain noted in this region.     Electronically Signed   By: Denny Flack M.D.   On: 03/16/2024 13:24  CT C spine 03/16/24 FINDINGS: Alignment: Alignment is grossly anatomic.   Skull base and vertebrae: Prior C3-4 and C4-5 ACDF, with stable appearance of the orthopedic hardware. Interval C4-C6 laminectomies, with posterior fusion hardware spanning C4 through C7. Intrapedicular screws are seen on the left from C3 through C7 and on the right at C3, C4, C6, and C7. There are lucencies through the superior aspects of the right C5 and C6 facets as well as the left C4, C5, C6, and C7 facets, likely ghost holes from attempted hardware placement. There are no acute displaced fractures.   Soft tissues and spinal canal: Postsurgical changes are seen within the midline soft tissues of posterior neck from recent decompressive laminectomy. Surgical drain is identified along the dorsal margin of the central canal from C4 through C6. Soft tissue gas and fluid are seen along the deep margins of the posterior incision, likely representing postoperative seroma or hematoma. This is better seen on recent MRI.   Disc levels: Prior C3-4 and C4-5 ACDF. Moderate spondylosis at C5-6 and C6-7. Adequate decompression of the central canal after laminectomy spanning C4 through C6.   Upper chest: Airway is patent.  Lung apices are clear.   Other: Reconstructed images demonstrate no additional findings.   IMPRESSION: 1. Stable C3-4 and C4-5 ACDF, with no change in orthopedic hardware. 2. Recent postsurgical changes from decompressive laminectomy and posterior fusion spanning C3 through C7, with anatomic alignment. 3. Subcutaneous gas and fluid along  the deep margin of the posterior midline incision, consistent with postoperative seroma or hematoma. Surgical drain is seen along the dorsal margin of the central canal. 4. No acute displaced fracture.     Electronically Signed   By: Bobbye Burrow M.D.   On: 03/16/2024 20:20    Assessment/Plan:  Eileen Chaney is a 48 y.o presenting with cervical myelopathy s/p C3-7 PSF., C4-6 decompression experiencing new left UE weakness post-op concerning for C5 palsy  - mobilize - pain control; increased Lyrica. Changed PO to diluadid (ok with patient to take benadryl  PRN for itching), started short steroid taper.  - MRI and CT scan ordered yesterday given new symptoms - HV removed today as output was serous. Wound vac removed as well - DVT prophylaxis - PTOT  Anastacio Karvonen PA-C Department of Neurosurgery

## 2024-03-18 NOTE — Progress Notes (Signed)
 [facility] IP rehab admissions - We have received approval from insurance carrier for acute inpatient rehab admission.  We will have a bed for patient tomorrow, Friday, if patient is medically ready and continues to want to admit to [facility].  We will follow up in am.  [phone]

## 2024-03-18 NOTE — H&P (Incomplete)
 Physical Medicine and Rehabilitation Admission H&P     HPI: Eileen Chaney is a 48 year old right-handed female with history significant for anxiety/depression as well as a component of ADHD maintained on Ritalin, asthma, class I obesity with BMI 35.28, diabetes mellitus, fibromyalgia, hyperlipidemia, hypertension, posterior cervical laminectomy 01/07/2022, anterior cervical decompression/discectomy fusion 11/12/2021 per Dr. Nance Aw complicated by cervical wound debridement 01/20/2022.  Per chart review patient lives with her daughter.  She has good support from her partner daughter and son.  Two-level home bed and bath on main level.  Independent community ambulator.  Presented to [facility] 03/15/2024 with increasing difficulty with dexterity in her hands as well as poor balance.  She was having trouble using her phone as well as difficulty with buttons and clasping her necklaces .[name]  She continued to have ongoing neck and left arm and shoulder pain with numbness as well as low back pain that extends into her buttocks..  She has denied any recent falls.  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  Most recent preoperative labs showed a hemoglobin of 12.8 and hematocrit 40.3 on 03/03/2024 and unremarkable chemistries.  Underwent posterior segmental instrumentation C3-7 as well as posterior lateral arthrodesis from C5-7 with cervical laminectomy from C4-C6 for decompression of the spinal cord with bilateral foraminotomies and facetectomies at C3/4, C4-5, C5-6, and left foraminotomies C6-7 03/15/2024 per Dr. Nance Aw.  Placed in a cervical brace for comfort.  She did complete a course of Decadron  taper.  Pain control with the use of Dilaudid  2 mg every 4 hours as needed as well as Valium  for spasms.  Lovenox added for DVT prophylaxis 03/16/2024.  Follow-up imaging CT/MRI cervical spine 03/16/2024 due to complaints of increasing neck pain and  acute left arm weakness showed noted degenerative changes moderate to severe spinal canal stenosis C3-4 with mild cord compression without definitive cord signal abnormality.  Multilevel foraminal stenosis greatest and severe bilaterally at C3-4 and C5-6 on the left at C6-7.  Moderate to severe spinal canal stenosis on the right at C6-7.  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  Therapy evaluations completed due to patient's decreased functional mobility was admitted for a comprehensive rehab program.  Review of Systems  Constitutional:  Negative for chills and fever.  HENT:  Negative for hearing loss.   Eyes:  Negative for blurred vision and double vision.  Respiratory:  Negative for cough, shortness of breath and wheezing.   Cardiovascular:  Negative for chest pain, palpitations and leg swelling.  Gastrointestinal:  Positive for constipation. Negative for heartburn, nausea and vomiting.  Genitourinary:  Negative for dysuria, flank pain and hematuria.  Musculoskeletal:  Positive for back pain, joint pain, myalgias and neck pain.  Skin:  Negative for rash.  Neurological:  Positive for sensory change, weakness and headaches.  Psychiatric/Behavioral:  Positive for depression.        Anxiety  All other systems reviewed and are negative.  Past Medical History:  Diagnosis Date   Anxiety    Arthritis    Asthma    Cervical myelopathy with cervical radiculopathy (HCC)    Complication of anesthesia    with c sections had a had time getting her sedated   DDD (degenerative disc disease), cervical    Depression    Diabetes mellitus without complication (HCC)  type 2   Family history of adverse reaction to anesthesia    mother hard to wake up   Fibromyalgia    Hyperlipidemia    Hypertension    Idiopathic urticaria    Pneumonia     PONV (postoperative nausea and vomiting)    Undifferentiated inflammatory arthritis    Past Surgical History:  Procedure Laterality Date   ABDOMINAL HYSTERECTOMY  2018   uterine fibroids   ANKLE SURGERY Right 2022   ligament repair   ANTERIOR CERVICAL DECOMP/DISCECTOMY FUSION N/A 11/12/2021   Procedure: C3-5 ANTERIOR CERVICAL DISCECTOMY AND FUSION (GLOBUS HEDRON);  Surgeon: Jodeen Munch, MD;  Location: [facility] ORS;  Service: Neurosurgery;  Laterality: N/A;   CERVICAL WOUND DEBRIDEMENT N/A 01/20/2022   Procedure: posterior wound debridment;  Surgeon: Jodeen Munch, MD;  Location: [facility] OR[facility];  Service: Neurosurgery;  Laterality: N/A;   CESAREAN SECTION     X3 2000, 2001, 2009   DILATION AND CURETTAGE OF UTERUS     POSTERIOR CERVICAL FUSION/FORAMINOTOMY N/A 03/15/2024   Procedure: POSTERIOR CERVICAL FUSION/FORAMINOTOMY LEVEL 4;  Surgeon: Jodeen Munch, MD;  Location: [facility] OR[facility];  Service: Neurosurgery;  Laterality: N/A;  C4-7 Posterior Spinal Decompression C4-7 Posterior Spinal Instrumentation C5-7 Fusion   POSTERIOR CERVICAL LAMINECTOMY N/A 01/07/2022   Procedure: OPEN C5-7 POSTERIOR DECOMPRESSION;  Surgeon: Jodeen Munch, MD;  Location: [facility] OR[facility];  Service: Neurosurgery;  Laterality: N/A;   TONSILLECTOMY  2000   WISDOM TOOTH EXTRACTION     History reviewed. No pertinent family history. Social History:  reports that she has never smoked. She has never used smokeless tobacco. She reports current alcohol use. She reports that she does not currently use drugs. Allergies:  Allergies  Allergen Reactions   Acetaminophen  Itching and Other (See Comments)    Eyes throat swelling   Other Hives    Per patient has idiopathic urticaria   Dilaudid  [Hydromorphone  Hcl] Itching   Lactose Other (See Comments)   Medications Prior to Admission  Medication Sig Dispense Refill   Aspirin-Salicylamide-Caffeine (BC HEADACHE POWDER PO) Take 1 packet by mouth 2 (two) times daily as needed  (pain.).     cetirizine (ZYRTEC) 10 MG tablet Take 10 mg by mouth in the morning and at bedtime.     desvenlafaxine (PRISTIQ) 50 MG 24 hr tablet Take 50 mg by mouth every evening.     EPINEPHrine  0.3 mg/0.3 mL IJ SOAJ injection Inject 0.3 mg into the muscle as needed for anaphylaxis.     fluticasone  (FLONASE ) 50 MCG/ACT nasal spray Place 2 sprays into both nostrils at bedtime.     folic acid (FOLVITE) 1 MG tablet Take 1 mg by mouth daily.     gabapentin  (NEURONTIN ) 300 MG capsule Take 1 capsule (300 mg total) by mouth 3 (three) times daily. (Patient taking differently: Take 300 mg by mouth 3 (three) times daily as needed (nerve pain.).) 90 capsule 3   hydrocortisone cream (CORTIZONE-10 INTENSVE MOISTURE) 1 % Apply 1 Application topically 2 (two) times daily as needed (skin irritation/itching.).     hydrOXYzine  (ATARAX ) 50 MG tablet Take 50 mg by mouth at bedtime as needed (sleep).     methotrexate 50 MG/2ML injection Inject 15 mg into the skin every Saturday.     methylphenidate (RITALIN) 20 MG tablet Take 20 mg by mouth in the morning.     minoxidil (LONITEN) 2.5 MG tablet Take 2.5 mg by mouth in the morning.     mometasone (ELOCON) 0.1 % cream Apply 1 Application topically daily.  Multiple Vitamin (MULTIVITAMIN WITH MINERALS) TABS tablet Take 1 tablet by mouth in the morning.     Naltrexone HCl, Pain, 4.5 MG CAPS Take 4.5 mg by mouth every evening.     naproxen sodium (ALEVE) 220 MG tablet Take 220-440 mg by mouth 2 (two) times daily as needed (pain.).     orphenadrine (NORFLEX) 100 MG tablet Take 100 mg by mouth 2 (two) times daily as needed for muscle spasms.     pregabalin (LYRICA) 75 MG capsule Take 75 mg by mouth 2 (two) times daily.     spironolactone  (ALDACTONE ) 100 MG tablet Take 100 mg by mouth 2 (two) times daily.     tirzepatide (MOUNJARO) 15 MG/0.5ML Pen Inject 15 mg into the skin every Saturday.     Vitamin D, Ergocalciferol, (DRISDOL) 1.25 MG (50000 UNIT) CAPS capsule Take  50,000 Units by mouth every Saturday.     albuterol  (VENTOLIN  HFA) 108 (90 Base) MCG/ACT inhaler Inhale into the lungs every 6 (six) hours as needed for wheezing or shortness of breath. (Patient not taking: Reported on 03/15/2024)     atorvastatin  (LIPITOR) 40 MG tablet Take 1 tablet (40 mg total) by mouth daily. (Patient not taking: Reported on 03/15/2024) 90 tablet 0   famotidine  (PEPCID ) 40 MG tablet Take 40 mg by mouth 2 (two) times daily. Twice daily (Patient not taking: Reported on 03/15/2024)     LYSINE PO Take 1,000 mg by mouth 2 (two) times daily as needed (fever blisters.). (Patient not taking: Reported on 03/15/2024)     montelukast  (SINGULAIR ) 10 MG tablet Take 10 mg by mouth in the morning. (Patient not taking: Reported on 03/15/2024)        Home: Home Living Family/patient expects to be discharged to:: Private residence Living Arrangements: Children Available Help at Discharge: Family, Available 24 hours/day Type of Home: House Home Access: Stairs to enter [employer] of Steps: 1+1 with L rail Entrance Stairs-Rails: Left Home Layout: Two level, Able to live on main level with bedroom/bathroom Alternate Level Stairs-Number of Steps: 20 Alternate Level Stairs-Rails: Can reach both (last 5 steps have L rail only) Bathroom Shower/Tub: Health visitor: Pharmacist, community: Yes Home Equipment: Medical laboratory scientific officer - single point  Lives With: Significant other   Functional History: Prior Function Prior Level of Function : Independent/Modified Independent Mobility Comments: indep amb community distances without an AD ADLs Comments: MOD I-I with ADL/IADL  Functional Status:  Mobility: Bed Mobility Overal bed mobility: Needs Assistance Bed Mobility: Rolling, Sidelying to Sit Rolling: Supervision Sidelying to sit: HOB elevated, Supervision General bed mobility comments: Pt upright at EOB upon arrival to room and transferred to the  recliner. Transfers Overall transfer level: Needs assistance Equipment used: None Transfers: Sit to/from Stand Sit to Stand: Contact guard assist Bed to/from chair/wheelchair/BSC transfer type:: Step pivot Step pivot transfers: Min assist, +2 physical assistance, +2 safety/equipment General transfer comment: painful mobilization, noting the pain to be the most in the L GHJ. Ambulation/Gait Ambulation/Gait assistance: Contact guard assist, Min assist Gait Distance (Feet): 160 Feet Assistive device: None Gait Pattern/deviations: Step-through pattern, Staggering left, Staggering right, Drifts right/left General Gait Details: pt with increased difficulty ambulating today due to the pain and noting a reduction in the overall strength of the LE's compared to session with OT.  Pt had to take frequent rest breaks with use of the handrail before continuing.  RN notified of pt's request for pain medication. Gait velocity: decreased    ADL: ADL Overall ADL's :  Needs assistance/impaired Eating/Feeding: Set up, Sitting Grooming: Set up, Sitting Upper Body Dressing : Maximal assistance, Sitting Upper Body Dressing Details (indicate cue type and reason): adjust sling Lower Body Dressing: Maximal assistance, Bed level Lower Body Dressing Details (indicate cue type and reason): slides shoes on with SET UP seated on edge of bed Toilet Transfer: Contact guard assist, Ambulation, Minimal assistance Toilet Transfer Details (indicate cue type and reason): simulated Toileting- Clothing Manipulation and Hygiene: Maximal assistance Toileting - Clothing Manipulation Details (indicate cue type and reason): anticipate Functional mobility during ADLs: Contact guard assist, Minimal assistance (approx 200' with no AD)  Cognition: Cognition Orientation Level: Oriented X4 Cognition Arousal: Alert Behavior During Therapy: WFL for tasks assessed/performed  Physical Exam: Blood pressure 114/69, pulse 90,  temperature 97.9 F (36.6 C), temperature source Oral, resp. rate 17, height 5' 8 (1.727 m), weight 105.2 kg, SpO2 100%. Physical Exam Neck:     Comments: Cervical collar in place Neurological:     Comments: Patient is alert sitting up in bed.  She did appear somewhat anxious.  Followed commands.  Provides name and age.    No results found for this or any previous visit (from the past 48 hours). CT CERVICAL SPINE WO CONTRAST Result Date: 03/16/2024 CLINICAL DATA:  Neck pain, prior cervical spine surgery EXAM: CT CERVICAL SPINE WITHOUT CONTRAST TECHNIQUE: Multidetector CT imaging of the cervical spine was performed without intravenous contrast. Multiplanar CT image reconstructions were also generated. RADIATION DOSE REDUCTION: This exam was performed according to the departmental dose-optimization program which includes automated exposure control, adjustment of the mA and/or kV according to patient size and/or use of iterative reconstruction technique. COMPARISON:  03/16/2024, 02/05/2024 FINDINGS: Alignment: Alignment is grossly anatomic. Skull base and vertebrae: Prior C3-4 and C4-5 ACDF, with stable appearance of the orthopedic hardware. Interval C4-C6 laminectomies, with posterior fusion hardware spanning C4 through C7. Intrapedicular screws are seen on the left from C3 through C7 and on the right at C3, C4, C6, and C7. There are lucencies through the superior aspects of the right C5 and C6 facets as well as the left C4, C5, C6, and C7 facets, likely ghost holes from attempted hardware placement. There are no acute displaced fractures. Soft tissues and spinal canal: Postsurgical changes are seen within the midline soft tissues of posterior neck from recent decompressive laminectomy. Surgical drain is identified along the dorsal margin of the central canal from C4 through C6. Soft tissue gas and fluid are seen along the deep margins of the posterior incision, likely representing postoperative seroma or  hematoma. This is better seen on recent MRI. Disc levels: Prior C3-4 and C4-5 ACDF. Moderate spondylosis at C5-6 and C6-7. Adequate decompression of the central canal after laminectomy spanning C4 through C6. Upper chest: Airway is patent.  Lung apices are clear. Other: Reconstructed images demonstrate no additional findings. IMPRESSION: 1. Stable C3-4 and C4-5 ACDF, with no change in orthopedic hardware. 2. Recent postsurgical changes from decompressive laminectomy and posterior fusion spanning C3 through C7, with anatomic alignment. 3. Subcutaneous gas and fluid along the deep margin of the posterior midline incision, consistent with postoperative seroma or hematoma. Surgical drain is seen along the dorsal margin of the central canal. 4. No acute displaced fracture. Electronically Signed   By: Bobbye Burrow M.D.   On: 03/16/2024 20:20   MR CERVICAL SPINE WO CONTRAST Result Date: 03/16/2024 CLINICAL DATA:  Cervical radiculopathy, acute left arm weakness overnight with associated pain and numbness. History of ACDF and C4-C6 decompression.  EXAM: MRI CERVICAL SPINE WITHOUT CONTRAST TECHNIQUE: Multiplanar, multisequence MR imaging of the cervical spine was performed. No intravenous contrast was administered. COMPARISON:  Cervical spine radiograph 03/15/2024. MRI cervical spine 12/30/2023. FINDINGS: Alignment: Straightening of the normal cervical lordosis. No significant listhesis. Vertebrae: Redemonstrated anterior cervical fusion spanning C3-C5. Interval decompressive laminectomy from C4-C6. Interval placement of posterior instrumented fusion spanning C3-C7. Modic type 1 degenerative endplate changes at C5-6 with mild discogenic edema. Vertebral body heights are maintained. No evidence of fracture. Cord: Normal signal and morphology. Posterior Fossa, vertebral arteries, paraspinal tissues: The visualized posterior fossa is unremarkable. Bilateral vertebral artery flow voids visualized. Postsurgical changes in the  midline paraspinal soft tissues. There is edema throughout the subcutaneous tissues and paraspinal musculature of the cervical spine likely reflecting postsurgical changes. Postoperative fluid within the dorsal epidural soft tissues from C4-5 to C6-7 without significant impingement upon the thecal sac. Disc levels: C2-3: Diffuse disc bulge which indents the ventral thecal sac. Thickening of the ligamentum flavum. Moderate spinal canal stenosis. Bilateral facet arthrosis. No significant foraminal stenosis. C3-4: Disc osteophyte complex slightly eccentric to the left which indents the ventral thecal sac flattening of the ventral cervical cord. Thickening of the ligamentum flavum. Moderate to severe spinal canal stenosis. There is subtle cord compression at this level without definite cord signal abnormality. Bilateral facet arthrosis and uncovertebral hypertrophy. Severe bilateral foraminal stenosis. C4-5: Postsurgical changes at this level. Mild chronic volume loss of the cord at this level without definite cord signal abnormality or cord compression. Bilateral facet arthrosis and uncovertebral hypertrophy. There is mild right and moderate left foraminal stenosis. C5-6: Disc osteophyte complex eccentric to the left which indents the ventral thecal sac without contacting the spinal cord. Posterior decompression. No significant spinal canal stenosis. Bilateral facet arthrosis and uncovertebral hypertrophy. Severe bilateral foraminal stenosis. C6-7: Disc osteophyte complex indents the ventral thecal sac with subtle flattening of the ventral cervical cord. Posterior decompression. Bilateral facet arthrosis and uncovertebral hypertrophy. Moderate to severe right and severe left foraminal stenosis. C7-T1: Disc osteophyte complex indents the ventral thecal sac with mild flattening of the ventral cervical cord. Thickening of the ligamentum flavum. Moderate spinal canal stenosis. Bilateral facet arthrosis. Mild bilateral  foraminal stenosis. IMPRESSION: Interval postsurgical changes of posterior decompression from C4-C6 with posterior instrumented fusion spanning C3-C7. Degenerative changes as above. Moderate to severe spinal canal stenosis at C3-4 with mild cord compression without definite cord signal abnormality. Multilevel foraminal stenosis, greatest and severe bilaterally at C3-4 and C5-6 and on the left at C6-7. Moderate to severe spinal canal stenosis on the right at C6-7. Degenerative endplate changes and mild discogenic edema at C6-7 which may contribute to neck pain. Fluid along the dorsal aspect of the spinal canal from C4-5 to C6-7 which is likely postoperative. Surgical drain noted in this region. Electronically Signed   By: Denny Flack M.D.   On: 03/16/2024 13:24      Blood pressure 114/69, pulse 90, temperature 97.9 F (36.6 C), temperature source Oral, resp. rate 17, height 5' 8 (1.727 m), weight 105.2 kg, SpO2 100%.  Medical Problem List and Plan: 1. Functional deficits secondary to cervical myelopathy status post C3-7 PSF, C4-6 decompression 03/15/2024 with postoperative left upper extremity weakness as well as history of cervical laminectomy 01/07/2022 as well as 11/12/2021.  Cervical collar as directed  -patient may *** shower  -ELOS/Goals: *** 2.  Antithrombotics: -DVT/anticoagulation:  Pharmaceutical: Lovenox check vascular study  -antiplatelet therapy: N/A 3. Pain Management: Lyrica 150 mg 3 times daily,  Dilaudid  2 mg every 4 hours as needed Valium  as needed muscle spasms 4. Mood/Behavior/Sleep/ADHD: Ritalin 20 mg every morning, Effexor 75 mg daily, Atarax  50 mg nightly as needed sleep,   -antipsychotic agents: N/A 5. Neuropsych/cognition: This patient is capable of making decisions on her own behalf. 6. Skin/Wound Care: Routine skin checks 7. Fluids/Electrolytes/Nutrition: Routine in and outs with follow-up chemistries 8.  Hypertension.  Aldactone  100 mg twice daily.  Monitor with  increased mobility 9.  Constipation.  Colace 100 mg twice daily, MiraLAX daily as needed 10.  Diabetes mellitus.  Hemoglobin A1c 5.8.  Blood sugars 103-94-106-134.  Patient on Mounjaro prior to admission and resume at discharge. 11.  Hyperlipidemia.  Lipitor 40 mg daily 12.  History of asthma.  Singulair  10 mg daily.  Check oxygen saturations every shift 13.  Class I obesity.  BMI 35.28.  Dietary follow-up     Sterling Eisenmenger, PA-C 03/18/2024

## 2024-03-18 NOTE — TOC Initial Note (Signed)
 Transition of Care [facility]) - Initial/Assessment Note    Patient Details  Name: Eileen Chaney MRN: [id number] Date of Birth: 1975/12/18  Transition of [facility]) CM/SW Contact:    Alexandra Ice, RN Phone Number: 03/18/2024, 3:35 PM  Clinical Narrative:                 Patient referred to CIR, insurance approved.   Expected Discharge Plan: IP Rehab Facility Barriers to Discharge: Continued Medical Work up   Patient Goals and CMS Choice            Expected Discharge Plan and Services     Post Acute Care Choice: IP Rehab Living arrangements for the past 2 months: Single Family Home                   DME Agency: NA       HH Arranged: NA          Prior Living Arrangements/Services Living arrangements for the past 2 months: Single Family Home Lives with:: Adult Children Patient language and need for interpreter reviewed:: Yes Do you feel safe going back to the place where you live?: Yes      Need for Family Participation in Patient Care: No (Comment) Care giver support system in place?: Yes (comment) Current home services: DME (single point cane) Criminal Activity/Legal Involvement Pertinent to Current Situation/Hospitalization: No - Comment as needed  Activities of Daily Living   ADL Screening (condition at time of admission) Independently performs ADLs?: Yes (appropriate for developmental age) Is the patient deaf or have difficulty hearing?: No Does the patient have difficulty seeing, even when wearing glasses/contacts?: No Does the patient have difficulty concentrating, remembering, or making decisions?: No  Permission Sought/Granted                  Emotional Assessment Appearance:: Appears stated age       Alcohol / Substance Use: Not Applicable Psych Involvement: No (comment)  Admission diagnosis:  Cervical myelopathy (HCC) [G95.9] Cervical spinal stenosis [M48.02] Cervical radiculopathy [M54.12] S/P cervical spinal fusion  [Z98.1] Patient Active Problem List   Diagnosis Date Noted   Cervical myelopathy (HCC) 03/15/2024   Spinal stenosis in cervical region 03/15/2024   Cervical radiculopathy 03/15/2024   S/P cervical spinal fusion 03/15/2024   Wound infection after surgery 01/20/2022   Gestational diabetes 06/02/2020   Arthritis 10/11/2019   DDD (degenerative disc disease), lumbar 10/11/2019   Hyperlipidemia 10/11/2019   Idiopathic urticaria 10/11/2019   Recurrent major depressive disorder, in partial remission (HCC) 10/11/2019   Spinal stenosis of lumbar region without neurogenic claudication 10/11/2019   Type 2 diabetes mellitus without complication, with long-term current use of insulin  (HCC) 10/11/2019   Elevated lactic acid level 09/29/2017   Leukocytosis 09/29/2017   Diabetes (HCC) 09/28/2017   Hypertension 09/28/2017   Mild asthma without complication 09/28/2017   Morbid obesity (HCC) 09/28/2017   Thalassemia 09/28/2017   GAD (generalized anxiety disorder) 07/25/2014   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, unspecified 03/14/2014   PCP:  Eartha Gold, MD Pharmacy:   [id number] [place], [place] - [address] AT [facility] [address] [place] [address] Phone: [phone] Fax: [phone]     Social Drivers of Health (SDOH) Social History: SDOH Screenings   Food Insecurity: No Food Insecurity (03/15/2024)  Housing: Low Risk  (03/15/2024)  Transportation Needs: No Transportation Needs (03/15/2024)  Utilities: Not  At Risk (03/15/2024)  Depression (PHQ2-9): Low Risk  (01/19/2021)  Financial Resource Strain: Low Risk  (07/30/2023)   Received from [facility]  Physical Activity: Insufficiently Active (12/28/2022)   Received from [facility]  Social Connections: Socially Integrated (12/28/2022)   Received from [facility]  Stress: No Stress Concern Present (12/28/2022)   Received from [facility]  Tobacco Use: Low Risk  (03/15/2024)   SDOH Interventions:     Readmission Risk Interventions     No data to display

## 2024-03-18 NOTE — Progress Notes (Signed)
 PT Cancellation Note  Patient Details Name: Eileen Chaney MRN: [id number] DOB: 04/12/1976   Cancelled Treatment:    Reason Eval/Treat Not Completed: Other (comment).  Chart reviewed and attempted to see pt.  Pt with significant pain levels upon entering into the room, and therapist elected to hold off until pain is more manageable at this time.  Pt to be seen at later date as medically appropriate.   Rozanna Corner, PT, DPT Physical Therapist - [facility]  03/18/24, 5:19 PM

## 2024-03-19 ENCOUNTER — Inpatient Hospital Stay (HOSPITAL_COMMUNITY)
Admission: AD | Admit: 2024-03-19 | Discharge: 2024-04-01 | DRG: 560 | Disposition: A | Discharge status: Home / Self Care | Source: Intra-hospital | Attending: Physical Medicine & Rehabilitation | Admitting: Physical Medicine & Rehabilitation

## 2024-03-19 ENCOUNTER — Other Ambulatory Visit: Payer: Self-pay

## 2024-03-19 DIAGNOSIS — G8929 Other chronic pain: Secondary | ICD-10-CM | POA: Diagnosis present

## 2024-03-19 DIAGNOSIS — M542 Cervicalgia: Secondary | ICD-10-CM | POA: Diagnosis not present

## 2024-03-19 DIAGNOSIS — D72829 Elevated white blood cell count, unspecified: Secondary | ICD-10-CM | POA: Diagnosis not present

## 2024-03-19 DIAGNOSIS — E66811 Obesity, class 1: Secondary | ICD-10-CM | POA: Diagnosis present

## 2024-03-19 DIAGNOSIS — Z79899 Other long term (current) drug therapy: Secondary | ICD-10-CM

## 2024-03-19 DIAGNOSIS — Z79622 Long term (current) use of janus kinase inhibitor: Secondary | ICD-10-CM

## 2024-03-19 DIAGNOSIS — E559 Vitamin D deficiency, unspecified: Secondary | ICD-10-CM | POA: Diagnosis present

## 2024-03-19 DIAGNOSIS — G959 Disease of spinal cord, unspecified: Principal | ICD-10-CM | POA: Diagnosis present

## 2024-03-19 DIAGNOSIS — S143XXA Injury of brachial plexus, initial encounter: Secondary | ICD-10-CM | POA: Diagnosis not present

## 2024-03-19 DIAGNOSIS — F32A Depression, unspecified: Secondary | ICD-10-CM | POA: Diagnosis present

## 2024-03-19 DIAGNOSIS — J45909 Unspecified asthma, uncomplicated: Secondary | ICD-10-CM | POA: Diagnosis present

## 2024-03-19 DIAGNOSIS — G992 Myelopathy in diseases classified elsewhere: Secondary | ICD-10-CM | POA: Diagnosis present

## 2024-03-19 DIAGNOSIS — Y838 Other surgical procedures as the cause of abnormal reaction of the patient, or of later complication, without mention of misadventure at the time of the procedure: Secondary | ICD-10-CM | POA: Diagnosis not present

## 2024-03-19 DIAGNOSIS — G4700 Insomnia, unspecified: Secondary | ICD-10-CM | POA: Diagnosis present

## 2024-03-19 DIAGNOSIS — Z90710 Acquired absence of both cervix and uterus: Secondary | ICD-10-CM | POA: Diagnosis not present

## 2024-03-19 DIAGNOSIS — M5412 Radiculopathy, cervical region: Secondary | ICD-10-CM | POA: Diagnosis present

## 2024-03-19 DIAGNOSIS — N319 Neuromuscular dysfunction of bladder, unspecified: Secondary | ICD-10-CM | POA: Diagnosis not present

## 2024-03-19 DIAGNOSIS — M50122 Cervical disc disorder at C5-C6 level with radiculopathy: Secondary | ICD-10-CM | POA: Diagnosis not present

## 2024-03-19 DIAGNOSIS — F418 Other specified anxiety disorders: Secondary | ICD-10-CM

## 2024-03-19 DIAGNOSIS — E785 Hyperlipidemia, unspecified: Secondary | ICD-10-CM | POA: Diagnosis present

## 2024-03-19 DIAGNOSIS — M50022 Cervical disc disorder at C5-C6 level with myelopathy: Secondary | ICD-10-CM | POA: Diagnosis not present

## 2024-03-19 DIAGNOSIS — M545 Low back pain, unspecified: Secondary | ICD-10-CM | POA: Diagnosis present

## 2024-03-19 DIAGNOSIS — Z7982 Long term (current) use of aspirin: Secondary | ICD-10-CM | POA: Diagnosis not present

## 2024-03-19 DIAGNOSIS — M4802 Spinal stenosis, cervical region: Secondary | ICD-10-CM | POA: Diagnosis present

## 2024-03-19 DIAGNOSIS — K5900 Constipation, unspecified: Secondary | ICD-10-CM | POA: Diagnosis not present

## 2024-03-19 DIAGNOSIS — Z981 Arthrodesis status: Secondary | ICD-10-CM

## 2024-03-19 DIAGNOSIS — M797 Fibromyalgia: Secondary | ICD-10-CM | POA: Diagnosis present

## 2024-03-19 DIAGNOSIS — Z794 Long term (current) use of insulin: Secondary | ICD-10-CM | POA: Diagnosis not present

## 2024-03-19 DIAGNOSIS — H532 Diplopia: Secondary | ICD-10-CM | POA: Diagnosis not present

## 2024-03-19 DIAGNOSIS — Z6836 Body mass index (BMI) 36.0-36.9, adult: Secondary | ICD-10-CM | POA: Diagnosis not present

## 2024-03-19 DIAGNOSIS — F419 Anxiety disorder, unspecified: Secondary | ICD-10-CM | POA: Diagnosis not present

## 2024-03-19 DIAGNOSIS — R682 Dry mouth, unspecified: Secondary | ICD-10-CM | POA: Diagnosis not present

## 2024-03-19 DIAGNOSIS — D62 Acute posthemorrhagic anemia: Secondary | ICD-10-CM | POA: Diagnosis present

## 2024-03-19 DIAGNOSIS — E119 Type 2 diabetes mellitus without complications: Secondary | ICD-10-CM | POA: Diagnosis present

## 2024-03-19 DIAGNOSIS — I10 Essential (primary) hypertension: Secondary | ICD-10-CM | POA: Diagnosis present

## 2024-03-19 DIAGNOSIS — Z4789 Encounter for other orthopedic aftercare: Principal | ICD-10-CM

## 2024-03-19 DIAGNOSIS — Z7985 Long-term (current) use of injectable non-insulin antidiabetic drugs: Secondary | ICD-10-CM | POA: Diagnosis not present

## 2024-03-19 DIAGNOSIS — G8324 Monoplegia of upper limb affecting left nondominant side: Secondary | ICD-10-CM | POA: Diagnosis present

## 2024-03-19 DIAGNOSIS — R5383 Other fatigue: Secondary | ICD-10-CM | POA: Diagnosis present

## 2024-03-19 DIAGNOSIS — I951 Orthostatic hypotension: Secondary | ICD-10-CM | POA: Diagnosis not present

## 2024-03-19 DIAGNOSIS — R12 Heartburn: Secondary | ICD-10-CM | POA: Diagnosis not present

## 2024-03-19 DIAGNOSIS — Z885 Allergy status to narcotic agent status: Secondary | ICD-10-CM

## 2024-03-19 DIAGNOSIS — R1013 Epigastric pain: Secondary | ICD-10-CM | POA: Diagnosis not present

## 2024-03-19 DIAGNOSIS — R200 Anesthesia of skin: Secondary | ICD-10-CM | POA: Diagnosis not present

## 2024-03-19 DIAGNOSIS — R000 Tachycardia, unspecified: Secondary | ICD-10-CM | POA: Diagnosis not present

## 2024-03-19 DIAGNOSIS — Z79631 Long term (current) use of antimetabolite agent: Secondary | ICD-10-CM

## 2024-03-19 DIAGNOSIS — Z888 Allergy status to other drugs, medicaments and biological substances status: Secondary | ICD-10-CM | POA: Diagnosis not present

## 2024-03-19 DIAGNOSIS — G8918 Other acute postprocedural pain: Secondary | ICD-10-CM | POA: Diagnosis not present

## 2024-03-19 DIAGNOSIS — R3915 Urgency of urination: Secondary | ICD-10-CM | POA: Diagnosis not present

## 2024-03-19 DIAGNOSIS — F909 Attention-deficit hyperactivity disorder, unspecified type: Secondary | ICD-10-CM | POA: Diagnosis present

## 2024-03-19 DIAGNOSIS — M51369 Other intervertebral disc degeneration, lumbar region without mention of lumbar back pain or lower extremity pain: Secondary | ICD-10-CM | POA: Diagnosis present

## 2024-03-19 DIAGNOSIS — M7989 Other specified soft tissue disorders: Secondary | ICD-10-CM | POA: Diagnosis not present

## 2024-03-19 DIAGNOSIS — T380X5A Adverse effect of glucocorticoids and synthetic analogues, initial encounter: Secondary | ICD-10-CM | POA: Diagnosis present

## 2024-03-19 DIAGNOSIS — K5901 Slow transit constipation: Secondary | ICD-10-CM | POA: Diagnosis not present

## 2024-03-19 DIAGNOSIS — Z886 Allergy status to analgesic agent status: Secondary | ICD-10-CM

## 2024-03-19 LAB — CREATININE, SERUM
Creatinine, Ser: 0.74 mg/dL (ref 0.44–1.00)
GFR, Estimated: >60 mL/min (ref >60)

## 2024-03-19 LAB — CBC
HCT: 30.9 % — ABNORMAL LOW (ref 36.0–46.0)
Hemoglobin: 10.1 g/dL — ABNORMAL LOW (ref 12.0–15.0)
MCH: 28.3 pg (ref 26.0–34.0)
MCHC: 32.7 g/dL (ref 30.0–36.0)
MCV: 86.6 fL (ref 80.0–100.0)
Platelets: 308 10*3/uL (ref 150–400)
RBC: 3.57 MIL/uL — ABNORMAL LOW (ref 3.87–5.11)
RDW: 14.8 % (ref 11.5–15.5)
WBC: 12.3 10*3/uL — ABNORMAL HIGH (ref 4.0–10.5)
nRBC: 0 % (ref 0.0–0.2)

## 2024-03-19 MED ORDER — MINOXIDIL 2.5 MG PO TABS
2.5000 mg | ORAL_TABLET | Freq: Every day | ORAL | Status: DC
  Administered 2024-03-20 – 2024-03-23 (×4): 2.5 mg via ORAL
  Filled 2024-03-19 (×4): qty 1

## 2024-03-19 MED ORDER — BACITRACIN-NEOMYCIN-POLYMYXIN OINTMENT TUBE: TOPICAL_OINTMENT | CUTANEOUS | Status: DC | PRN

## 2024-03-19 MED ORDER — HYDROMORPHONE HCL 2 MG PO TABS
2.0000 mg | ORAL_TABLET | ORAL | Status: DC | PRN
  Administered 2024-03-22 – 2024-03-28 (×5): 2 mg via ORAL
  Filled 2024-03-19 (×6): qty 1

## 2024-03-19 MED ORDER — ONDANSETRON HCL 4 MG/2ML IJ SOLN: 4.0000 mg | Freq: Four times a day (QID) | INTRAMUSCULAR | Status: DC | PRN

## 2024-03-19 MED ORDER — METHOCARBAMOL 1000 MG/10ML IJ SOLN
500.0000 mg | Freq: Four times a day (QID) | INTRAMUSCULAR | Status: DC
  Filled 2024-03-19 (×20): qty 5

## 2024-03-19 MED ORDER — FOLIC ACID 1 MG PO TABS
1.0000 mg | ORAL_TABLET | Freq: Every day | ORAL | Status: DC
  Administered 2024-03-20 – 2024-04-01 (×13): 1 mg via ORAL
  Filled 2024-03-19 (×13): qty 1

## 2024-03-19 MED ORDER — MOMETASONE FUROATE 0.1 % EX CREA
1.0000 | TOPICAL_CREAM | Freq: Every day | CUTANEOUS | Status: DC
  Filled 2024-03-19: qty 15

## 2024-03-19 MED ORDER — SORBITOL 70 % SOLN
30.0000 mL | Freq: Every day | Status: DC | PRN
  Administered 2024-03-22 – 2024-03-23 (×2): 30 mL via ORAL
  Filled 2024-03-19 (×2): qty 30

## 2024-03-19 MED ORDER — METHOCARBAMOL 500 MG PO TABS
1000.0000 mg | ORAL_TABLET | Freq: Four times a day (QID) | ORAL | Status: DC
  Administered 2024-03-19 – 2024-04-01 (×50): 1000 mg via ORAL
  Filled 2024-03-19 (×51): qty 2

## 2024-03-19 MED ORDER — OXYCODONE HCL 5 MG PO TABS
5.0000 mg | ORAL_TABLET | Freq: Three times a day (TID) | ORAL | Status: DC
  Administered 2024-03-19 – 2024-03-20 (×2): 5 mg via ORAL
  Filled 2024-03-19 (×2): qty 1

## 2024-03-19 MED ORDER — DIAZEPAM 5 MG PO TABS
5.0000 mg | ORAL_TABLET | Freq: Three times a day (TID) | ORAL | Status: DC | PRN
  Administered 2024-03-20 – 2024-03-28 (×9): 5 mg via ORAL
  Filled 2024-03-19 (×9): qty 1

## 2024-03-19 MED ORDER — DIAZEPAM 5 MG PO TABS: 5.0000 mg | ORAL_TABLET | Freq: Three times a day (TID) | ORAL | Status: DC | PRN

## 2024-03-19 MED ORDER — HYDROMORPHONE HCL 2 MG PO TABS
4.0000 mg | ORAL_TABLET | ORAL | Status: DC | PRN
  Administered 2024-03-19 – 2024-03-28 (×33): 4 mg via ORAL
  Filled 2024-03-19 (×39): qty 2

## 2024-03-19 MED ORDER — ONDANSETRON HCL 4 MG PO TABS: 4.0000 mg | ORAL_TABLET | Freq: Four times a day (QID) | ORAL | Status: DC | PRN

## 2024-03-19 MED ORDER — POLYETHYLENE GLYCOL 3350 17 G PO PACK
17.0000 g | PACK | Freq: Every day | ORAL | Status: DC | PRN
  Administered 2024-03-21: 17 g via ORAL
  Filled 2024-03-19 (×2): qty 1

## 2024-03-19 MED ORDER — PREGABALIN 50 MG PO CAPS
150.0000 mg | ORAL_CAPSULE | Freq: Three times a day (TID) | ORAL | Status: DC
  Administered 2024-03-19 – 2024-04-01 (×39): 150 mg via ORAL
  Filled 2024-03-19 (×39): qty 3

## 2024-03-19 MED ORDER — DIPHENHYDRAMINE HCL 25 MG PO CAPS
25.0000 mg | ORAL_CAPSULE | Freq: Four times a day (QID) | ORAL | Status: DC | PRN
  Administered 2024-03-24: 25 mg via ORAL
  Filled 2024-03-19: qty 1

## 2024-03-19 MED ORDER — HYDROMORPHONE HCL 2 MG PO TABS: 2.0000 mg | ORAL_TABLET | ORAL | Status: DC | PRN

## 2024-03-19 MED ORDER — METHOCARBAMOL 1000 MG PO TABS
1000.0000 mg | ORAL_TABLET | Freq: Four times a day (QID) | ORAL | Status: DC
Start: 2024-03-19 — End: 2024-04-01

## 2024-03-19 MED ORDER — SENNA 8.6 MG PO TABS
1.0000 | ORAL_TABLET | Freq: Two times a day (BID) | ORAL | Status: DC
Start: 2024-03-19 — End: 2024-04-13

## 2024-03-19 MED ORDER — FLUTICASONE PROPIONATE 50 MCG/ACT NA SUSP
2.0000 | Freq: Every day | NASAL | Status: DC
  Administered 2024-03-20 – 2024-03-31 (×11): 2 via NASAL
  Filled 2024-03-19 (×2): qty 16

## 2024-03-19 MED ORDER — VENLAFAXINE HCL ER 75 MG PO CP24
75.0000 mg | ORAL_CAPSULE | Freq: Every day | ORAL | Status: DC
  Administered 2024-03-20 – 2024-04-01 (×13): 75 mg via ORAL
  Filled 2024-03-19 (×13): qty 1

## 2024-03-19 MED ORDER — MONTELUKAST SODIUM 10 MG PO TABS
10.0000 mg | ORAL_TABLET | Freq: Every day | ORAL | Status: DC
  Administered 2024-03-19 – 2024-03-31 (×13): 10 mg via ORAL
  Filled 2024-03-19 (×13): qty 1

## 2024-03-19 MED ORDER — HYDROXYZINE HCL 25 MG PO TABS
50.0000 mg | ORAL_TABLET | Freq: Every evening | ORAL | Status: DC | PRN
  Administered 2024-03-20 – 2024-03-24 (×3): 50 mg via ORAL
  Administered 2024-03-29: 25 mg via ORAL
  Filled 2024-03-19 (×4): qty 2

## 2024-03-19 MED ORDER — BSS IO SOLN: 15.0000 mL | Freq: Once | INTRAOCULAR | Status: DC

## 2024-03-19 MED ORDER — VITAMIN D (ERGOCALCIFEROL) 1.25 MG (50000 UNIT) PO CAPS
50000.0000 [IU] | ORAL_CAPSULE | ORAL | Status: DC
  Administered 2024-03-20 – 2024-03-27 (×2): 50000 [IU] via ORAL
  Filled 2024-03-19 (×2): qty 1

## 2024-03-19 MED ORDER — ENOXAPARIN SODIUM 40 MG/0.4ML IJ SOSY
40.0000 mg | PREFILLED_SYRINGE | INTRAMUSCULAR | Status: DC
  Administered 2024-03-20 – 2024-04-01 (×13): 40 mg via SUBCUTANEOUS
  Filled 2024-03-19 (×13): qty 0.4

## 2024-03-19 MED ORDER — SPIRONOLACTONE 25 MG PO TABS
100.0000 mg | ORAL_TABLET | Freq: Two times a day (BID) | ORAL | Status: DC
  Administered 2024-03-19 – 2024-03-21 (×5): 100 mg via ORAL
  Filled 2024-03-19 (×6): qty 4

## 2024-03-19 MED ORDER — LORATADINE 10 MG PO TABS
10.0000 mg | ORAL_TABLET | Freq: Every day | ORAL | Status: DC
  Administered 2024-03-20 – 2024-04-01 (×13): 10 mg via ORAL
  Filled 2024-03-19 (×13): qty 1

## 2024-03-19 MED ORDER — ATORVASTATIN CALCIUM 40 MG PO TABS
40.0000 mg | ORAL_TABLET | Freq: Every day | ORAL | Status: DC
  Administered 2024-03-20 – 2024-03-26 (×7): 40 mg via ORAL
  Filled 2024-03-19 (×13): qty 1

## 2024-03-19 MED ORDER — SENNA 8.6 MG PO TABS
1.0000 | ORAL_TABLET | Freq: Two times a day (BID) | ORAL | Status: DC
  Administered 2024-03-19 – 2024-03-22 (×6): 8.6 mg via ORAL
  Filled 2024-03-19 (×6): qty 1

## 2024-03-19 MED ORDER — BACITRACIN-NEOMYCIN-POLYMYXIN OINTMENT TUBE
1.0000 | TOPICAL_OINTMENT | CUTANEOUS | Status: DC | PRN
Start: 2024-03-19 — End: 2024-04-01

## 2024-03-19 MED ORDER — DOCUSATE SODIUM 100 MG PO CAPS
100.0000 mg | ORAL_CAPSULE | Freq: Two times a day (BID) | ORAL | Status: DC
  Administered 2024-03-19 – 2024-03-21 (×4): 100 mg via ORAL
  Filled 2024-03-19 (×4): qty 1

## 2024-03-19 MED ORDER — DOCUSATE SODIUM 100 MG PO CAPS: 100.0000 mg | ORAL_CAPSULE | Freq: Two times a day (BID) | ORAL | Status: DC

## 2024-03-19 MED ORDER — ENOXAPARIN SODIUM 40 MG/0.4ML IJ SOSY: 40.0000 mg | PREFILLED_SYRINGE | INTRAMUSCULAR | Status: DC

## 2024-03-19 MED ORDER — DIPHENHYDRAMINE HCL 25 MG PO CAPS: 25.0000 mg | ORAL_CAPSULE | Freq: Four times a day (QID) | ORAL | Status: DC | PRN

## 2024-03-19 MED ORDER — AMITRIPTYLINE HCL 25 MG PO TABS
25.0000 mg | ORAL_TABLET | Freq: Every day | ORAL | Status: DC
  Administered 2024-03-19 – 2024-03-31 (×13): 25 mg via ORAL
  Filled 2024-03-19 (×14): qty 1

## 2024-03-19 MED ORDER — METHYLPHENIDATE HCL 5 MG PO TABS
20.0000 mg | ORAL_TABLET | Freq: Every morning | ORAL | Status: DC
  Administered 2024-03-20 – 2024-04-01 (×13): 20 mg via ORAL
  Filled 2024-03-19 (×13): qty 4

## 2024-03-19 NOTE — H&P (Signed)
 Physical Medicine and Rehabilitation Admission H&P       HPI: Eileen Chaney is a 48 year old right-handed female with history significant for anxiety/depression as well as a component of ADHD maintained on Ritalin, asthma, class I obesity with BMI 35.28, diabetes mellitus, fibromyalgia, hyperlipidemia, hypertension, posterior cervical laminectomy 01/07/2022, anterior cervical decompression/discectomy fusion 11/12/2021 per Dr. Nance Aw complicated by cervical wound debridement 01/20/2022.  Per chart review patient lives with her daughter.  She has good support from her partner daughter and son.  Two-level home bed and bath on main level.  Independent community ambulator.  Presented to [facility] 03/15/2024 with increasing difficulty with dexterity in her hands as well as poor balance.  She was having trouble using her phone as well as difficulty with buttons and clasping her necklaces .Eileen Chaney  She continued to have ongoing neck and left arm and shoulder pain with numbness as well as low back pain that extends into her buttocks..  She has denied any recent falls.  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  Most recent preoperative labs showed a hemoglobin of 12.8 and hematocrit 40.3 on 03/03/2024 and unremarkable chemistries.  Underwent posterior segmental instrumentation C3-7 as well as posterior lateral arthrodesis from C5-7 with cervical laminectomy from C4-C6 for decompression of the spinal cord with bilateral foraminotomies and facetectomies at C3/4, C4-5, C5-6, and left foraminotomies C6-7 03/15/2024 per Dr. Nance Aw.  Placed in a cervical brace for comfort.  She did complete a course of Decadron  taper.  Pain control with the use of Dilaudid  2 mg every 4 hours as needed as well as Valium  for spasms.  Lovenox added for DVT prophylaxis 03/16/2024.  Follow-up imaging CT/MRI cervical spine 03/16/2024 due to complaints of increasing neck pain  and acute left arm weakness showed noted degenerative changes moderate to severe spinal canal stenosis C3-4 with mild cord compression without definitive cord signal abnormality.  Multilevel foraminal stenosis greatest and severe bilaterally at C3-4 and C5-6 on the left at C6-7.  Moderate to severe spinal canal stenosis on the right at C6-7.  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  Therapy evaluations completed due to patient's decreased functional mobility was admitted for a comprehensive rehab program.   Review of Systems  Constitutional:  Negative for chills and fever.  HENT:  Negative for hearing loss.   Eyes:  Negative for blurred vision and double vision.  Respiratory:  Negative for cough, shortness of breath and wheezing.   Cardiovascular:  Negative for chest pain, palpitations and leg swelling.  Gastrointestinal:  Positive for constipation. Negative for heartburn, nausea and vomiting.  Genitourinary:  Negative for dysuria, flank pain and hematuria.  Musculoskeletal:  Positive for back pain, joint pain, myalgias and neck pain.  Skin:  Negative for rash.  Neurological:  Positive for sensory change, weakness and headaches.  Psychiatric/Behavioral:  Positive for depression.        Anxiety  All other systems reviewed and are negative.       Past Medical History:  Diagnosis Date   Anxiety     Arthritis     Asthma     Cervical myelopathy with cervical radiculopathy (HCC)     Complication of anesthesia      with c sections had a had time getting her sedated   DDD (degenerative  disc disease), cervical     Depression     Diabetes mellitus without complication (HCC)      type 2   Family history of adverse reaction to anesthesia      mother hard to wake up   Fibromyalgia     Hyperlipidemia     Hypertension     Idiopathic  urticaria     Pneumonia     PONV (postoperative nausea and vomiting)     Undifferentiated inflammatory arthritis               Past Surgical History:  Procedure Laterality Date   ABDOMINAL HYSTERECTOMY   2018    uterine fibroids   ANKLE SURGERY Right 2022    ligament repair   ANTERIOR CERVICAL DECOMP/DISCECTOMY FUSION N/A 11/12/2021    Procedure: C3-5 ANTERIOR CERVICAL DISCECTOMY AND FUSION (GLOBUS HEDRON);  Surgeon: Jodeen Munch, MD;  Location: [facility] ORS;  Service: Neurosurgery;  Laterality: N/A;   CERVICAL WOUND DEBRIDEMENT N/A 01/20/2022    Procedure: posterior wound debridment;  Surgeon: Jodeen Munch, MD;  Location: [facility] ORS;  Service: Neurosurgery;  Laterality: N/A;   CESAREAN SECTION        X3 2000, 2001, 2009   DILATION AND CURETTAGE OF UTERUS       POSTERIOR CERVICAL FUSION/FORAMINOTOMY N/A 03/15/2024    Procedure: POSTERIOR CERVICAL FUSION/FORAMINOTOMY LEVEL 4;  Surgeon: Jodeen Munch, MD;  Location: [facility] ORS;  Service: Neurosurgery;  Laterality: N/A;  C4-7 Posterior Spinal Decompression C4-7 Posterior Spinal Instrumentation C5-7 Fusion   POSTERIOR CERVICAL LAMINECTOMY N/A 01/07/2022    Procedure: OPEN C5-7 POSTERIOR DECOMPRESSION;  Surgeon: Jodeen Munch, MD;  Location: [facility] ORS;  Service: Neurosurgery;  Laterality: N/A;   TONSILLECTOMY   2000   WISDOM TOOTH EXTRACTION            History reviewed. No pertinent family history.     Social History:  reports that she has never smoked. She has never used smokeless tobacco. She reports current alcohol use. She reports that she does not currently use drugs. Allergies:  Allergies       Allergies  Allergen Reactions   Acetaminophen  Itching and Other (See Comments)      Eyes throat swelling   Other Hives      Per patient has idiopathic urticaria   Dilaudid  [Hydromorphone  Hcl] Itching   Lactose Other (See Comments)            Medications Prior to Admission  Medication Sig Dispense Refill    Aspirin-Salicylamide-Caffeine (BC HEADACHE POWDER PO) Take 1 packet by mouth 2 (two) times daily as needed (pain.).       cetirizine (ZYRTEC) 10 MG tablet Take 10 mg by mouth in the morning and at bedtime.       desvenlafaxine (PRISTIQ) 50 MG 24 hr tablet Take 50 mg by mouth every evening.       EPINEPHrine  0.3 mg/0.3 mL IJ SOAJ injection Inject 0.3 mg into the muscle as needed for anaphylaxis.       fluticasone  (FLONASE ) 50 MCG/ACT nasal spray Place 2 sprays into both nostrils at bedtime.       folic acid (FOLVITE) 1 MG tablet Take 1 mg by mouth daily.       gabapentin  (NEURONTIN ) 300 MG capsule Take 1 capsule (300 mg total) by mouth 3 (three) times daily. (Patient taking differently: Take 300 mg by mouth 3 (three) times daily as needed (nerve pain.).) 90 capsule 3   hydrocortisone cream (CORTIZONE-10 INTENSVE MOISTURE) 1 %  Apply 1 Application topically 2 (two) times daily as needed (skin irritation/itching.).       hydrOXYzine  (ATARAX ) 50 MG tablet Take 50 mg by mouth at bedtime as needed (sleep).       methotrexate 50 MG/2ML injection Inject 15 mg into the skin every Saturday.       methylphenidate (RITALIN) 20 MG tablet Take 20 mg by mouth in the morning.       minoxidil (LONITEN) 2.5 MG tablet Take 2.5 mg by mouth in the morning.       mometasone (ELOCON) 0.1 % cream Apply 1 Application topically daily.       Multiple Vitamin (MULTIVITAMIN WITH MINERALS) TABS tablet Take 1 tablet by mouth in the morning.       Naltrexone HCl, Pain, 4.5 MG CAPS Take 4.5 mg by mouth every evening.       naproxen sodium (ALEVE) 220 MG tablet Take 220-440 mg by mouth 2 (two) times daily as needed (pain.).       orphenadrine (NORFLEX) 100 MG tablet Take 100 mg by mouth 2 (two) times daily as needed for muscle spasms.       pregabalin (LYRICA) 75 MG capsule Take 75 mg by mouth 2 (two) times daily.       spironolactone  (ALDACTONE ) 100 MG tablet Take 100 mg by mouth 2 (two) times daily.       tirzepatide (MOUNJARO)  15 MG/0.5ML Pen Inject 15 mg into the skin every Saturday.       Vitamin D, Ergocalciferol, (DRISDOL) 1.25 MG (50000 UNIT) CAPS capsule Take 50,000 Units by mouth every Saturday.       albuterol  (VENTOLIN  HFA) 108 (90 Base) MCG/ACT inhaler Inhale into the lungs every 6 (six) hours as needed for wheezing or shortness of breath. (Patient not taking: Reported on 03/15/2024)       atorvastatin  (LIPITOR) 40 MG tablet Take 1 tablet (40 mg total) by mouth daily. (Patient not taking: Reported on 03/15/2024) 90 tablet 0   famotidine  (PEPCID ) 40 MG tablet Take 40 mg by mouth 2 (two) times daily. Twice daily (Patient not taking: Reported on 03/15/2024)       LYSINE PO Take 1,000 mg by mouth 2 (two) times daily as needed (fever blisters.). (Patient not taking: Reported on 03/15/2024)       montelukast  (SINGULAIR ) 10 MG tablet Take 10 mg by mouth in the morning. (Patient not taking: Reported on 03/15/2024)                  Home: Home Living Family/patient expects to be discharged to:: Private residence Living Arrangements: Children Available Help at Discharge: Family, Available 24 hours/day Type of Home: House Home Access: Stairs to enter [employer] of Steps: 1+1 with L rail Entrance Stairs-Rails: Left Home Layout: Two level, Able to live on main level with bedroom/bathroom Alternate Level Stairs-Number of Steps: 20 Alternate Level Stairs-Rails: Can reach both (last 5 steps have L rail only) Bathroom Shower/Tub: Health visitor: Pharmacist, community: Yes Home Equipment: Medical laboratory scientific officer - single point  Lives With: Significant other   Functional History: Prior Function Prior Level of Function : Independent/Modified Independent Mobility Comments: indep amb community distances without an AD ADLs Comments: MOD I-I with ADL/IADL   Functional Status:  Mobility: Bed Mobility Overal bed mobility: Needs Assistance Bed Mobility: Rolling, Sidelying to Sit Rolling:  Supervision Sidelying to sit: HOB elevated, Supervision General bed mobility comments: Pt upright at EOB upon arrival to room and transferred to  the recliner. Transfers Overall transfer level: Needs assistance Equipment used: None Transfers: Sit to/from Stand Sit to Stand: Contact guard assist Bed to/from chair/wheelchair/BSC transfer type:: Step pivot Step pivot transfers: Min assist, +2 physical assistance, +2 safety/equipment General transfer comment: painful mobilization, noting the pain to be the most in the L GHJ. Ambulation/Gait Ambulation/Gait assistance: Contact guard assist, Min assist Gait Distance (Feet): 160 Feet Assistive device: None Gait Pattern/deviations: Step-through pattern, Staggering left, Staggering right, Drifts right/left General Gait Details: pt with increased difficulty ambulating today due to the pain and noting a reduction in the overall strength of the LE's compared to session with OT.  Pt had to take frequent rest breaks with use of the handrail before continuing.  RN notified of pt's request for pain medication. Gait velocity: decreased   ADL: ADL Overall ADL's : Needs assistance/impaired Eating/Feeding: Set up, Sitting Grooming: Set up, Sitting Upper Body Dressing : Maximal assistance, Sitting Upper Body Dressing Details (indicate cue type and reason): adjust sling Lower Body Dressing: Maximal assistance, Bed level Lower Body Dressing Details (indicate cue type and reason): slides shoes on with SET UP seated on edge of bed Toilet Transfer: Contact guard assist, Ambulation, Minimal assistance Toilet Transfer Details (indicate cue type and reason): simulated Toileting- Clothing Manipulation and Hygiene: Maximal assistance Toileting - Clothing Manipulation Details (indicate cue type and reason): anticipate Functional mobility during ADLs: Contact guard assist, Minimal assistance (approx 200' with no AD)   Cognition: Cognition Orientation Level:  Oriented X4 Cognition Arousal: Alert Behavior During Therapy: WFL for tasks assessed/performed   Physical Exam: Blood pressure 104/61, pulse 82, temperature 98 F (36.7 C), resp. rate 16, height 5' 8 (1.727 m), weight 105.2 kg, SpO2 98%. Physical Exam Constitutional: No apparent distress. Appropriate appearance for age.  HENT: No JVD. + soft C collar in place.  Eyes: PERRLA. EOMI. Visual fields grossly intact. + glasses Cardiovascular: RRR, no murmurs/rub/gallops. No Edema. Peripheral pulses 2+  Respiratory: CTAB. No rales, rhonchi, or wheezing. On RA.  Abdomen: + bowel sounds, normoactive. No distention or tenderness.  Skin: C/D/I. No apparent lesions.  MSK:      No apparent deformity. + LUE sling for comfort      + TTP L posterior shoulder - no palpable deformity  Neurologic exam:  Cognition: AAO to person, place, time and event.  Language: Fluent, No substitutions or neoglisms. No dysarthria. Names 3/3 objects correctly.  Memory: Recalls 3/3 objects at 5 minutes. No apparent deficits  Insight: Good  insight into current condition.  Mood: Pleasant affect, appropriate mood.  Sensation: To light touch intact in BL LE; LUE diffusely reduced compared to LUE; absent digits 1-3   Reflexes: 0 BL UE, 0  L patella; 2+ R patella. Negative Hoffman's and babinski signs bilaterally.   CN: 2-12 grossly intact.  Coordination: No apparent tremors. No ataxia  Spasticity: MAS 0 in all extremities.       Strength:                RUE: 5-/5 SA, 5-/5 EF, 5-/5 EE, 5-/5 WE, 5-/5 FF, 5-/5 FA                LUE:  1/5 SA, 3/5 EF, 3/5 EE, 3/5 WE, 4/5 FF, 4/5 FA                RLE: 5/5 HF, 5/5 KE, 5/5  DF, 5/5  EHL, 5/5  PF  LLE:  5/5 HF, 5/5 KE, 5/5  DF, 5/5  EHL, 5/5  PF       Lab Results Last 48 Hours  No results found for this or any previous visit (from the past 48 hours).   Imaging Results (Last 48 hours)  No results found.           Blood pressure 104/61, pulse 82,  temperature 98 F (36.7 C), resp. rate 16, height 5' 8 (1.727 m), weight 105.2 kg, SpO2 98%.   Medical Problem List and Plan: 1. Functional deficits secondary to cervical myelopathy status post C3-7 PSF, C4-6 decompression 03/15/2024 with postoperative left upper extremity weakness as well as history of cervical laminectomy 01/07/2022 as well as 11/12/2021.  Cervical collar as directed             -patient may shower with incisions covered              -ELOS/Goals: 10-12 days, Min A PT, OT  - Stable for inpatient rehab  2.  Antithrombotics: -DVT/anticoagulation:  Pharmaceutical: Lovenox check vascular study             -antiplatelet therapy: N/A 3. Pain Management: Lyrica 150 mg 3 times daily, Dilaudid  2 mg every 4 hours as moderate pain needed and 4 mg every 4 hours as needed severe pain, Robaxin  1000 mg every 6 hours, Valium  as needed muscle spasms   - Patient with slight allergy to dilaudid , has benadryll 35 mg Q6H PRN   - Poorly controlled L posteiro shoulder neuropathic pain - added elavil 25 mg at bedtime for pain/sleep + oxycodone  5 mg TID (no allergy prior, scheduled before therapies/bed). Did fail pain management with oxycodone  IR prior to dilaudid .  4. Mood/Behavior/Sleep/ADHD: Ritalin 20 mg every morning, Effexor 75 mg daily, Atarax  50 mg nightly as needed sleep,              -antipsychotic agents: N/A   - added elavil 25 mg at bedtime  5. Neuropsych/cognition: This patient is capable of making decisions on her own behalf. 6. Skin/Wound Care: Routine skin checks 7. Fluids/Electrolytes/Nutrition: Routine in and outs with follow-up chemistries 8.  Hypertension.  Aldactone  100 mg twice daily, minoxidil 2.5 mg daily.  Monitor with increased mobility 9.  Constipation.  Colace 100 mg twice daily, MiraLAX daily as needed 10.  Diabetes mellitus.  Hemoglobin A1c 5.8.  Blood sugars 103-94-106-134.  Patient on Mounjaro prior to admission and resume at discharge. 11.  Hyperlipidemia.  Lipitor  40 mg daily 12.  History of asthma.  Singulair  10 mg daily.  Check oxygen saturations every shift 13.  Class I obesity.  BMI 35.28.  Dietary follow-up  14. Hx b/l CTS surgery; LUE weakness s/p PSF. L digit 1-3 numbness on exam, recommend OP EMG on discharge.         Everlyn Hockey Angiulli, PA-C 03/19/2024  I have examined the patient independently and edited the note for HPI, ROS, exam, assessment, and plan as appropriate. I am in agreement with the above recommendations.   Bea Lime, DO 03/19/2024

## 2024-03-19 NOTE — Progress Notes (Signed)
 Writer called CIR and gave report to nurse Jearlean Mince at facility. Patient will be transported by ambulance via stretcher. Patient notified and family aware at this time. Patient has all belongings.

## 2024-03-19 NOTE — Progress Notes (Signed)
 Inpatient Rehab Admissions Coordinator:    I have a CIR bed for this Pt. Today. RN may call report to [phone].  Pt. To dc to CIR for an estimated 5-7 days with the goal of reaching mod I goals and discharging home with assistance of partner and children.  Wandalee Gust, MS, CCC-SLP Rehab Admissions Coordinator  [phone] (celll) [phone] (office)

## 2024-03-19 NOTE — Plan of Care (Signed)

## 2024-03-19 NOTE — Progress Notes (Addendum)
 Inpatient Rehabilitation Admission Medication Review by a Pharmacist  A complete drug regimen review was completed for this patient to identify any potential clinically significant medication issues.  High Risk Drug Classes Is patient taking? Indication by Medication  Antipsychotic No   Anticoagulant Yes Enoxaparin for DVT prophylaxis  Antibiotic No   Opioid Yes Hydrocmorphone for acute pain   Antiplatelet No   Hypoglycemics/insulin  No   Vasoactive Medication No   Chemotherapy No   Other Yes Atorvastatin  for HLD Docusate, miralax, senna, sorbitol for bowel care Fluticasone  NS, loratadine  for allergies  Methocarbamol , Diazepam  for muscle spasms Methylphenidate for ADHD Minoxidil and spironolactone  for HTN  Montelukast  for asthma  Pregabalin for nerve pain  Venlafaxine for depression  Vitamin D for supplementation Hydroxyzine  for sleep  Ondansetron  for nausea     Type of Medication Issue Identified Description of Issue Recommendation(s)  Drug Interaction(s) (clinically significant)     Duplicate Therapy     Allergy     No Medication Administration End Date  Mometasone cream  OK to stop per provider   Incorrect Dose     Additional Drug Therapy Needed     Significant med changes from prior encounter (inform family/care partners about these prior to discharge). HELD home BC powder, famotidine  BID, gabapentin  PRN, methotrexate, naltrexone, naproxen, orphenadrine prn muscle spasms, tirzepatide  INCREASED pregabalin  NEW folic acid, methocarbamol   Review at discharge   Other       Clinically significant medication issues were identified that warrant physician communication and completion of prescribed/recommended actions by midnight of the next day:  No  Name of provider notified for urgent issues identified:   Provider Method of Notification:     Pharmacist comments:   Time spent performing this drug regimen review (minutes):  20  Dorene Gang, PharmD, [place],  [facility] Clinical Pharmacist  Please check AMION for all [facility] phone numbers After 10:00 PM, call Main Pharmacy [phone]

## 2024-03-19 NOTE — Progress Notes (Signed)
 Inpatient Rehabilitation Care Coordinator Assessment and Plan Patient Details  Name: Eileen Chaney MRN: [id number] Date of Birth: 17-May-1976  Today's Date: 03/19/2024  Hospital Problems: Principal Problem:   Cervical myelopathy [facility])  Past Medical History:  Past Medical History:  Diagnosis Date   Anxiety    Arthritis    Asthma    Cervical myelopathy with cervical radiculopathy (HCC)    Complication of anesthesia    with c sections had a had time getting her sedated   DDD (degenerative disc disease), cervical    Depression    Diabetes mellitus without complication (HCC)    type 2   Family history of adverse reaction to anesthesia    mother hard to wake up   Fibromyalgia    Hyperlipidemia    Hypertension    Idiopathic urticaria    Pneumonia    PONV (postoperative nausea and vomiting)    Undifferentiated inflammatory arthritis    Past Surgical History:  Past Surgical History:  Procedure Laterality Date   ABDOMINAL HYSTERECTOMY  2018   uterine fibroids   ANKLE SURGERY Right 2022   ligament repair   ANTERIOR CERVICAL DECOMP/DISCECTOMY FUSION N/A 11/12/2021   Procedure: C3-5 ANTERIOR CERVICAL DISCECTOMY AND FUSION (GLOBUS HEDRON);  Surgeon: Jodeen Munch, MD;  Location: [facility];  Service: Neurosurgery;  Laterality: N/A;   CERVICAL WOUND DEBRIDEMENT N/A 01/20/2022   Procedure: posterior wound debridment;  Surgeon: Jodeen Munch, MD;  Location: [facility];  Service: Neurosurgery;  Laterality: N/A;   CESAREAN SECTION     X3 2000, 2001, 2009   DILATION AND CURETTAGE OF UTERUS     POSTERIOR CERVICAL FUSION/FORAMINOTOMY N/A 03/15/2024   Procedure: POSTERIOR CERVICAL FUSION/FORAMINOTOMY LEVEL 4;  Surgeon: Jodeen Munch, MD;  Location: [facility];  Service: Neurosurgery;  Laterality: N/A;  C4-7 Posterior Spinal Decompression C4-7 Posterior Spinal Instrumentation C5-7 Fusion   POSTERIOR CERVICAL LAMINECTOMY N/A 01/07/2022   Procedure: OPEN C5-7 POSTERIOR  DECOMPRESSION;  Surgeon: Jodeen Munch, MD;  Location: [facility];  Service: Neurosurgery;  Laterality: N/A;   TONSILLECTOMY  2000   WISDOM TOOTH EXTRACTION     Social History:  reports that she has never smoked. She has never used smokeless tobacco. She reports current alcohol use. She reports that she does not currently use drugs.  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 Pt hopes to be moving on her own at [place] Caregiver Availability: 24/7 Family Dynamics: Close with children, partner and family. 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   Abuse/Neglect Abuse/Neglect Assessment Can Be Completed: Yes Physical Abuse: Denies Verbal Abuse: Denies Sexual Abuse: Denies Exploitation of patient/patient's resources: Denies Self-Neglect: Denies  Patient response to: Social Isolation - How often do you feel lonely or isolated from those around you?: Never  Emotional Status Pt's affect, behavior and  adjustment status: Pt is motivated to get her function back, she had hoped to be in the hospital one day and then  back home but had a complication with her arms and weakness. She has been through back surgery in 2023 and did well, she hopes tp do well here and get back home soon Recent Psychosocial Issues: other health issues Psychiatric History: Hx-depression/anxiety takes medications for this and finds them helpful. May benefit from seeing neuro-psych while here Substance Abuse History: NA  Patient / Family Perceptions, Expectations & Goals Pt/Family understanding of illness & functional limitations: Pt is able to explain her back surgery and now the weakness she has in her arms. She has a sling on her left arm. She does talk with the MD's involved and feels understands her plna moving forward. Premorbid pt/family roles/activities: mom, partner, employee, daughter, friend, etc Anticipated changes in roles/activities/participation: resume Pt/family expectations/goals: Pt states:   I want to be able to take care of myself when I leave here.  Community Resources [employer]: Other (Comment) (had IV antibiotics in the past 2023) Premorbid Home Care/DME Agencies: Other (Comment) (rw from last back surgery) Transportation available at discharge: self and family Is the patient able to respond to transportation needs?: Yes In the past 12 months, has lack of transportation kept you from medical appointments or from getting medications?: No In the past 12 months, has lack of transportation kept you from meetings, work, or from getting things needed for daily living?: No Resource referrals recommended: Neuropsychology  Discharge Planning Living Arrangements: Spouse/significant other Support Systems: Children, Other relatives, Friends/neighbors Type of Residence: Private residence Insurance Resources: Media planner (specify) Herbalist) Financial Resources: Employment, Garment/textile technologist Screen Referred: No Living Expenses: Banker Management: Patient, Significant Other Does the patient  have any problems obtaining your medications?: No Home Management: both she and partner Patient/Family Preliminary Plans: Return home with partner who is there most of the time and she reports daughter is close by. Made aware of the rehab process and will be evalauted tomorrow and goals set ofr stay here. Team conference on Wed and will update then with goals and [facility] Care Coordinator Barriers to Discharge: Insurance for SNF coverage Care Coordinator Anticipated Follow Up Needs: HH/OP  Clinical Impression Pleasant female who had just arrived from [facility]. She is motivated to do well and get her movement in her arms back. Aware of will be evaluated tomorrow and goals set for stay here. Will work on discharge needs and ask neuro-psych to see while here  Mardell Shade 03/19/2024, 1:03 PM

## 2024-03-19 NOTE — Progress Notes (Signed)
 PMR Admission Coordinator Pre-Admission Assessment   Patient: Eileen Chaney is an 48 y.o., female MRN: [id number] DOB: 24-Jun-1976 Height: 5' 8 (172.7 cm) Weight: 105.2 kg   Insurance Information HMO:     PPO: yes     PCP:      IPA:      80/20:      OTHER:  PRIMARY: BCBS Comm PPO      Policy#: [id number]      Subscriber: pt CM Name:       Phone#: [phone]     Fax#: [phone]   Pre-Cert#: [id number] approval vis fax from 03/18/24 to 03/31/24 with updated due by 03/31/24      Employer:  Benefits:  Phone #:      Name:  Venson Ginger Date: 10/01/2023 - 09/29/2198 Deductible: $300 ($300 met) OOP Max: $3,500 ($2,161.57 met) CIR: 80% coverage, 20% co-insurance SNF: 80% coverage, 20% co-insurance, limited to 120 days/cal yr (120 remaining) Outpatient:  80% coverage, 20% co-insurance Home Health: 100% coverage DME: 80% coverage, 20% co-insurance Providers: in network   SECONDARY:       Policy#:      Phone#:      Artist:       Phone#:   The Data processing manager" for patients in Inpatient Rehabilitation Facilities with attached "Privacy Act Statement-Health Care Records" was provided and verbally reviewed with: Patient   Emergency Contact Information Contact Information       Name Relation Home Work Mobile    [place] Daughter     [phone]    [place], [place] Father     [phone]    Amil Balding     [phone]         Other Contacts   None on File        Current Medical History  Patient Admitting Diagnosis: Cervical Myelopathy   History of Present Illness: Eileen Chaney is a 48 year old right-handed female with history significant for anxiety/depression as well as a component of ADHD maintained on Ritalin, asthma, class I obesity with BMI 35.28, diabetes mellitus, fibromyalgia, hyperlipidemia, hypertension, posterior cervical laminectomy 01/07/2022, anterior cervical decompression/discectomy fusion 11/12/2021 per Dr. Nance Aw complicated by cervical wound debridement 01/20/2022.  Per chart review patient lives with her daughter.  She has good support from her partner daughter and son.  Two-level home bed and bath on main level.  Independent community ambulator.  Presented to [facility] 03/15/2024 with increasing difficulty with dexterity in her hands as well as poor balance.  She was having trouble using her phone as well as difficulty with buttons and clasping her necklaces .[name]  She continued to have ongoing neck and left arm and shoulder pain with numbness as well as low back pain that extends into her buttocks..  She has denied any recent falls.  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  Most recent preoperative labs showed a hemoglobin of 12.8 and hematocrit 40.3 on 03/03/2024 and unremarkable chemistries.  Underwent posterior segmental instrumentation C3-7 as well as posterior lateral arthrodesis from C5-7 with cervical laminectomy from C4-C6 for decompression of the spinal cord with bilateral foraminotomies and facetectomies at C3/4, C4-5, C5-6, and left foraminotomies C6-7 03/15/2024 per Dr. Nance Aw.  Placed in a cervical brace for comfort.  She did complete a course of Decadron  taper.  Pain control with the use of Dilaudid  2 mg every 4 hours as needed as well as Valium  for spasms.  Lovenox added for  DVT prophylaxis 03/16/2024.  Follow-up imaging CT/MRI cervical spine 03/16/2024 due to complaints of increasing neck pain and acute left arm weakness showed noted degenerative changes moderate to severe spinal canal stenosis C3-4 with mild cord compression without definitive cord signal abnormality.  Multilevel foraminal stenosis greatest and severe bilaterally at C3-4 and C5-6 on the left at C6-7.  Moderate to severe spinal canal stenosis on the right at C6-7.  It was suggested by neurosurgery patient exhibiting some component of neuropraxia of the brachial plexus  exacerbating her left arm weakness versus radicular dysfunction and advised to continue to monitor.  Therapy evaluations completed due to patient's decreased functional mobility was admitted for a comprehensive rehab program.    Patient's medical record from [facility] has been reviewed by the rehabilitation admission coordinator and physician.   Past Medical History      Past Medical History:  Diagnosis Date   Anxiety     Arthritis     Asthma     Cervical myelopathy with cervical radiculopathy (HCC)     Complication of anesthesia      with c sections had a had time getting her sedated   DDD (degenerative disc disease), cervical     Depression     Diabetes mellitus without complication (HCC)      type 2   Family history of adverse reaction to anesthesia      mother hard to wake up   Fibromyalgia     Hyperlipidemia     Hypertension     Idiopathic urticaria     Pneumonia     PONV (postoperative nausea and vomiting)     Undifferentiated inflammatory arthritis            Has the patient had major surgery during 100 days prior to admission? Yes   Family History   family history is not on file.   Current Medications  Current Medications    Current Facility-Administered Medications:    balanced salts (BSS) ophthalmic solution 15 mL, 15 mL, Irrigation, Once, Zula Hitch, MD   [COMPLETED] dexamethasone  (DECADRON ) injection 8 mg, 8 mg, Intravenous, Q6H, 8 mg at 03/17/24 0511 **FOLLOWED BY** dexamethasone  (DECADRON ) injection 4 mg, 4 mg, Intravenous, Q6H, Noble Bateman, PA, 4 mg at 03/17/24 1207   diazepam  (VALIUM ) tablet 5 mg, 5 mg, Oral, Q8H PRN, Jodeen Munch, MD, 5 mg at 03/17/24 1205   diphenhydrAMINE  (BENADRYL ) capsule 25 mg, 25 mg, Oral, Q6H PRN, Noble Bateman, PA   docusate sodium (COLACE) capsule 100 mg, 100 mg, Oral, BID, Noble Bateman, PA, 100 mg at 03/17/24 0913   enoxaparin (LOVENOX) injection 40 mg, 40 mg, Subcutaneous,  Q24H, Noble Bateman, PA, 40 mg at 03/17/24 0926   fluticasone  (FLONASE ) 50 MCG/ACT nasal spray 2 spray, 2 spray, Each Nare, QHS, Noble Bateman, PA, 2 spray at 03/16/24 2129   folic acid (FOLVITE) tablet 1 mg, 1 mg, Oral, Daily, Noble Bateman, PA, 1 mg at 03/17/24 0913   hydrocortisone cream 1 % 1 Application, 1 Application, Topical, BID PRN, Noble Bateman, PA   HYDROmorphone  (DILAUDID ) tablet 2 mg, 2 mg, Oral, Q4H PRN, Jodeen Munch, MD   HYDROmorphone  (DILAUDID ) tablet 4 mg, 4 mg, Oral, Q4H PRN, Jodeen Munch, MD, 4 mg at 03/17/24 0912   hydrOXYzine  (ATARAX ) tablet 50 mg, 50 mg, Oral, QHS PRN, Noble Bateman, PA, 50 mg at 03/16/24 2316   loratadine  (CLARITIN ) tablet 10 mg, 10 mg, Oral, Daily, Regena Cap,  Ishmael Marcus, PA, 10 mg at 03/17/24 0913   magnesium citrate solution 1 Bottle, 1 Bottle, Oral, Once PRN, Noble Bateman, PA   menthol -cetylpyridinium (CEPACOL) lozenge 3 mg, 1 lozenge, Oral, PRN **OR** phenol (CHLORASEPTIC) mouth spray 1 spray, 1 spray, Mouth/Throat, PRN, Noble Bateman, PA   methocarbamol  (ROBAXIN ) tablet 1,000 mg, 1,000 mg, Oral, Q6H, 1,000 mg at 03/17/24 1205 **OR** methocarbamol  (ROBAXIN ) injection 500 mg, 500 mg, Intravenous, Q6H, Jodeen Munch, MD   methylphenidate (RITALIN) tablet 20 mg, 20 mg, Oral, q AM, Noble Bateman, PA, 20 mg at 03/17/24 0913   minoxidil (LONITEN) tablet 2.5 mg, 2.5 mg, Oral, q AM, Noble Bateman, PA, 2.5 mg at 03/17/24 0916   mometasone (ELOCON) 0.1 % cream 1 Application, 1 Application, Topical, Daily, Noble Bateman, [place], 1 Application at 03/17/24 1610   neomycin-bacitracin-polymyxin (NEOSPORIN) ointment, , Topical, PRN, Jodeen Munch, MD, Given at 03/15/24 2134   ondansetron  (ZOFRAN ) tablet 4 mg, 4 mg, Oral, Q6H PRN **OR** ondansetron  (ZOFRAN ) injection 4 mg, 4 mg, Intravenous, Q6H PRN, Noble Bateman, PA   polyethylene glycol (MIRALAX / GLYCOLAX) packet 17 g, 17 g, Oral, Daily PRN, Noble Bateman, PA   pregabalin (LYRICA) capsule 150 mg, 150 mg, Oral, TID, Jodeen Munch, MD, 150 mg at 03/17/24 0914   senna (SENOKOT) tablet 8.6 mg, 1 tablet, Oral, BID, Noble Bateman, PA, 8.6 mg at 03/17/24 0913   sodium chloride  flush (NS) 0.9 % injection 3 mL, 3 mL, Intravenous, Q12H, Noble Bateman, PA, 3 mL at 03/17/24 1217   sodium chloride  flush (NS) 0.9 % injection 3 mL, 3 mL, Intravenous, PRN, Noble Bateman, PA   sorbitol 70 % solution 30 mL, 30 mL, Oral, Daily PRN, Noble Bateman, PA   spironolactone  (ALDACTONE ) tablet 100 mg, 100 mg, Oral, BID, Noble Bateman, PA, 100 mg at 03/17/24 0913   venlafaxine XR (EFFEXOR-XR) 24 hr capsule 75 mg, 75 mg, Oral, Q breakfast, Noble Bateman, [place], 75 mg at 03/17/24 0915   [START ON 03/20/2024] Vitamin D (Ergocalciferol) (DRISDOL) 1.25 MG (50000 UNIT) capsule 50,000 Units, 50,000 Units, Oral, Q Sat, Noble Bateman, [place]     Patients Current Diet:  Diet Order                  Diet regular Room service appropriate? Yes; Fluid consistency: Thin  Diet effective now                         Precautions / Restrictions Precautions Precautions: Fall, Cervical Cervical Brace: For comfort Restrictions Weight Bearing Restrictions Per Provider Order: No    Has the patient had 2 or more falls or a fall with injury in the past year? No   Prior Activity Level Community (5-7x/wk): Pt active in the community PTA   Prior Functional Level Self Care: Did the patient need help bathing, dressing, using the toilet or eating? Independent   Indoor Mobility: Did the patient need assistance with walking from room to room (with or without device)? Independent   Stairs: Did the patient need assistance with internal or external stairs (with or without device)? Independent   Functional Cognition: Did the patient need help planning regular tasks such as shopping or remembering to take medications? Independent   Patient  Information Are you of Hispanic, Latino/a,or Spanish origin?: A. No, not of Hispanic, Latino/a, or Spanish origin What is your race?: A. White Do you need or  want an interpreter to communicate with a doctor or health care staff?: 0. No   Patient's Response To:  Health Literacy and Transportation Is the patient able to respond to health literacy and transportation needs?: Yes Health Literacy - How often do you need to have someone help you when you read instructions, pamphlets, or other written material from your doctor or pharmacy?: Never In the past 12 months, has lack of transportation kept you from medical appointments or from getting medications?: No In the past 12 months, has lack of transportation kept you from meetings, work, or from getting things needed for daily living?: No   Home Assistive Devices / Equipment Home Equipment: Jeananne Mighty - single point   Prior Device Use: Indicate devices/aids used by the patient prior to current illness, exacerbation or injury? None of the above   Current Functional Level Cognition   Orientation Level: Oriented X4    Extremity Assessment (includes Sensation/Coordination)   Upper Extremity Assessment: LUE deficits/detail LUE Deficits / Details: not formally assessed as pt has requested sling, LUE in sling and tx session targeting optimizing mobility on this date; will continue to assess LUE Sensation: decreased light touch LUE Coordination: decreased fine motor, decreased gross motor  Lower Extremity Assessment: Generalized weakness     ADLs   Overall ADL's : Needs assistance/impaired Eating/Feeding: Set up, Sitting Grooming: Set up, Sitting Upper Body Dressing : Maximal assistance, Sitting Upper Body Dressing Details (indicate cue type and reason): adjust sling Lower Body Dressing: Maximal assistance, Bed level Lower Body Dressing Details (indicate cue type and reason): slides shoes on with SET UP seated on edge of bed Toilet Transfer:  Contact guard assist, Ambulation, Minimal assistance Toilet Transfer Details (indicate cue type and reason): simulated Toileting- Clothing Manipulation and Hygiene: Maximal assistance Toileting - Clothing Manipulation Details (indicate cue type and reason): anticipate Functional mobility during ADLs: Contact guard assist, Minimal assistance (approx 200' with no AD)     Mobility   Overal bed mobility: Needs Assistance Bed Mobility: Rolling, Sidelying to Sit Rolling: Min assist Sidelying to sit: Min assist, HOB elevated General bed mobility comments: Pt upright at EOB upon arrival to room and transferred to the recliner.     Transfers   Overall transfer level: Needs assistance Equipment used: 1 person hand held assist Transfers: Sit to/from Stand Sit to Stand: Min assist Bed to/from chair/wheelchair/BSC transfer type:: Step pivot Step pivot transfers: Min assist, +2 physical assistance, +2 safety/equipment General transfer comment: pt with good technique, however very painful to mobilize and pt unable to functionally utilize the L UE.     Ambulation / Gait / Stairs / Wheelchair Mobility   Ambulation/Gait General Gait Details: deferred at this time.     Posture / Balance Balance Overall balance assessment: Needs assistance Sitting-balance support: Feet supported Sitting balance-Leahy Scale: Good Standing balance support: Single extremity supported Standing balance-Leahy Scale: Fair     Special needs/care consideration Skin surgical incision     Previous Home Environment (from acute therapy documentation) Living Arrangements: Children  Lives With: Significant other Available Help at Discharge: Family, Available 24 hours/day Type of Home: House Home Layout: Two level, Able to live on main level with bedroom/bathroom Alternate Level Stairs-Rails: Can reach both (last 5 steps have L rail only) Alternate Level Stairs-Number of Steps: 20 Home Access: Stairs to enter Entrance  Stairs-Rails: Left Entrance Stairs-Number of Steps: 1+1 with L rail Bathroom Shower/Tub: Health visitor: Standard Bathroom Accessibility: Yes How Accessible: Accessible via wheelchair Home Care  Services: No   Discharge Living Setting Plans for Discharge Living Setting: Patient's home Type of Home at Discharge: House Discharge Home Layout: Able to live on main level with bedroom/bathroom Discharge Home Access: Stairs to enter Entrance Stairs-Rails: None Entrance Stairs-Number of Steps: 3 Discharge Bathroom Shower/Tub: Tub/shower unit Discharge Bathroom Toilet: Standard Discharge Bathroom Accessibility: No Does the patient have any problems obtaining your medications?: No   Social/Family/Support Systems Patient Roles: Partner, Other (Comment) Contact Information: Pt. partner and children can rotate to be with her 24/7 Caregiver Availability: 24/7 Discharge Plan Discussed with Primary Caregiver: Yes Is Caregiver In Agreement with Plan?: Yes Does Caregiver/Family have Issues with Lodging/Transportation while Pt is in Rehab?: No   Goals Patient/Family Goal for Rehab: PT/OT/SLP Min A Expected length of stay: 10-12 days Pt/Family Agrees to Admission and willing to participate: Yes Program Orientation Provided & Reviewed with Pt/Caregiver Including Roles  & Responsibilities: Yes   Decrease burden of Care through IP rehab admission: not anticipated   Possible need for SNF placement upon discharge: not anticipated   Patient Condition: I have reviewed medical records from [facility], spoken with CM, and patient. I discussed via phone for inpatient rehabilitation assessment.  Patient will benefit from ongoing PT, OT, and SLP, can actively participate in 3 hours of therapy a day 5 days of the week, and can make measurable gains during the admission.  Patient will also benefit from the coordinated team approach during an Inpatient Acute Rehabilitation  admission.  The patient will receive intensive therapy as well as Rehabilitation physician, nursing, social worker, and care management interventions.  Due to safety, skin/wound care, disease management, medication administration, pain management, and patient education the patient requires 24 hour a day rehabilitation nursing.  The patient is currently Min A to CGA  with mobility and basic ADLs.  Discharge setting and therapy post discharge at home with home health is anticipated.  Patient has agreed to participate in the Acute Inpatient Rehabilitation Program and will admit today.   Preadmission Screen Completed By:  Dorena Gander, 03/17/2024 1:28 PM ______________________________________________________________________   Discussed status with Dr. Dorn Gaskins  on 03/19/24 at 900 and received approval for admission today.   Admission Coordinator:  Dorena Gander, CCC-SLP, time 924/Date 03/19/24    Assessment/Plan: Diagnosis:  Cervical myelopathy status post C3-7 PSF, C4-6 decompression 03/15/2024 with postoperative left upper extremity weakness  Does the need for close, 24 hr/day Medical supervision in concert with the patient's rehab needs make it unreasonable for this patient to be served in a less intensive setting? Yes Co-Morbidities requiring supervision/potential complications: Poor pain control, complex wounds, hypertension, constipation, diabetes, asthma Due to bladder management, bowel management, safety, skin/wound care, disease management, medication administration, pain management, and patient education, does the patient require 24 hr/day rehab nursing? Yes Does the patient require coordinated care of a physician, rehab nurse, PT, OT  to address physical and functional deficits in the context of the above medical diagnosis(es)? Yes Addressing deficits in the following areas: balance, endurance, locomotion, strength, transferring, bowel/bladder control, bathing, dressing, feeding, grooming, and  toileting Can the patient actively participate in an intensive therapy program of at least 3 hrs of therapy 5 days a week? Yes The potential for patient to make measurable gains while on inpatient rehab is good Anticipated functional outcomes upon discharge from inpatient rehab: min assist PT, min assist OT Estimated rehab length of stay to reach the above functional goals is: 10-12 days Anticipated discharge destination: Home 10.  Overall Rehab/Functional Prognosis: good     MD Signature:   Bea Lime, DO 03/19/2024

## 2024-03-19 NOTE — Progress Notes (Signed)
 Neurosurgery Progress Note  History: Eileen Chaney is s/p C3-7 PSF., C4-6 decompression  POD4: Pain and numbness improving some.  POD3: wound vac lost suction overnight pt reports improvement in the numbness in her left hand.  POD2: Pts left arm pain improved some this morning POD1: Pt experiencing acute left arm weakness overnight with associated pain and numbness which is new. Pain is uncontrolled  Physical Exam: Vitals:   03/19/24 0407 03/19/24 0755  BP: 104/61 108/74  Pulse: 82 88  Resp: 16 18  Temp: 98 F (36.7 C) 98 F (36.7 C)  SpO2: 98% 96%    AA Ox3 CNI  Strength:5/5 throughout BLE  Side Biceps Triceps Deltoid Interossei Grip Wrist Ext. Wrist Flex.  R 4+ 4+ 4 4- 4- 4- 4-  L 3 3 2  4- 4- 4- 4-    Incision intake. There is serous drainage on the dressing this morning but not obvious active drainage  Data:  Other tests/results:  UA. Negative   03/16/24 MRI C spine FINDINGS: Alignment: Straightening of the normal cervical lordosis. No significant listhesis.   Vertebrae: Redemonstrated anterior cervical fusion spanning C3-C5. Interval decompressive laminectomy from C4-C6. Interval placement of posterior instrumented fusion spanning C3-C7. Modic type 1 degenerative endplate changes at C5-6 with mild discogenic edema. Vertebral body heights are maintained. No evidence of fracture.   Cord: Normal signal and morphology.   Posterior Fossa, vertebral arteries, paraspinal tissues: The visualized posterior fossa is unremarkable. Bilateral vertebral artery flow voids visualized. Postsurgical changes in the midline paraspinal soft tissues. There is edema throughout the subcutaneous tissues and paraspinal musculature of the cervical spine likely reflecting postsurgical changes. Postoperative fluid within the dorsal epidural soft tissues from C4-5 to C6-7 without significant impingement upon the thecal sac.   Disc levels:   C2-3: Diffuse disc bulge which  indents the ventral thecal sac. Thickening of the ligamentum flavum. Moderate spinal canal stenosis. Bilateral facet arthrosis. No significant foraminal stenosis.   C3-4: Disc osteophyte complex slightly eccentric to the left which indents the ventral thecal sac flattening of the ventral cervical cord. Thickening of the ligamentum flavum. Moderate to severe spinal canal stenosis. There is subtle cord compression at this level without definite cord signal abnormality. Bilateral facet arthrosis and uncovertebral hypertrophy. Severe bilateral foraminal stenosis.   C4-5: Postsurgical changes at this level. Mild chronic volume loss of the cord at this level without definite cord signal abnormality or cord compression. Bilateral facet arthrosis and uncovertebral hypertrophy. There is mild right and moderate left foraminal stenosis.   C5-6: Disc osteophyte complex eccentric to the left which indents the ventral thecal sac without contacting the spinal cord. Posterior decompression. No significant spinal canal stenosis. Bilateral facet arthrosis and uncovertebral hypertrophy. Severe bilateral foraminal stenosis.   C6-7: Disc osteophyte complex indents the ventral thecal sac with subtle flattening of the ventral cervical cord. Posterior decompression. Bilateral facet arthrosis and uncovertebral hypertrophy. Moderate to severe right and severe left foraminal stenosis.   C7-T1: Disc osteophyte complex indents the ventral thecal sac with mild flattening of the ventral cervical cord. Thickening of the ligamentum flavum. Moderate spinal canal stenosis. Bilateral facet arthrosis. Mild bilateral foraminal stenosis.   IMPRESSION: Interval postsurgical changes of posterior decompression from C4-C6 with posterior instrumented fusion spanning C3-C7.   Degenerative changes as above. Moderate to severe spinal canal stenosis at C3-4 with mild cord compression without definite cord signal  abnormality.   Multilevel foraminal stenosis, greatest and severe bilaterally at C3-4 and C5-6 and on the left  at C6-7. Moderate to severe spinal canal stenosis on the right at C6-7.   Degenerative endplate changes and mild discogenic edema at C6-7 which may contribute to neck pain.   Fluid along the dorsal aspect of the spinal canal from C4-5 to C6-7 which is likely postoperative. Surgical drain noted in this region.     Electronically Signed   By: Denny Flack M.D.   On: 03/16/2024 13:24  CT C spine 03/16/24 FINDINGS: Alignment: Alignment is grossly anatomic.   Skull base and vertebrae: Prior C3-4 and C4-5 ACDF, with stable appearance of the orthopedic hardware. Interval C4-C6 laminectomies, with posterior fusion hardware spanning C4 through C7. Intrapedicular screws are seen on the left from C3 through C7 and on the right at C3, C4, C6, and C7. There are lucencies through the superior aspects of the right C5 and C6 facets as well as the left C4, C5, C6, and C7 facets, likely ghost holes from attempted hardware placement. There are no acute displaced fractures.   Soft tissues and spinal canal: Postsurgical changes are seen within the midline soft tissues of posterior neck from recent decompressive laminectomy. Surgical drain is identified along the dorsal margin of the central canal from C4 through C6. Soft tissue gas and fluid are seen along the deep margins of the posterior incision, likely representing postoperative seroma or hematoma. This is better seen on recent MRI.   Disc levels: Prior C3-4 and C4-5 ACDF. Moderate spondylosis at C5-6 and C6-7. Adequate decompression of the central canal after laminectomy spanning C4 through C6.   Upper chest: Airway is patent.  Lung apices are clear.   Other: Reconstructed images demonstrate no additional findings.   IMPRESSION: 1. Stable C3-4 and C4-5 ACDF, with no change in orthopedic hardware. 2. Recent postsurgical  changes from decompressive laminectomy and posterior fusion spanning C3 through C7, with anatomic alignment. 3. Subcutaneous gas and fluid along the deep margin of the posterior midline incision, consistent with postoperative seroma or hematoma. Surgical drain is seen along the dorsal margin of the central canal. 4. No acute displaced fracture.     Electronically Signed   By: Bobbye Burrow M.D.   On: 03/16/2024 20:20    Assessment/Plan:  Eileen Chaney is a 48 y.o presenting with cervical myelopathy s/p C3-7 PSF., C4-6 decompression experiencing new left UE weakness post-op concerning for neuropraxia   - mobilize - pain control; increased Lyrica. Changed PO to diluadid (ok with patient to take benadryl  PRN for itching), started short steroid taper.  - clean dressing placed on incision this morning  - DVT prophylaxis - PTOT - plan to d/c to inpatient rehab today. Would recommend daily dressing changes for 3 days and then ok to leave incision open to air  Anastacio Karvonen PA-C Department of Neurosurgery

## 2024-03-19 NOTE — TOC Transition Note (Signed)
 Transition of Care [facility]) - Discharge Note   Patient Details  Name: Eileen Chaney MRN: [id number] Date of Birth: 1975/10/01  Transition of Care [facility]) CM/SW Contact:  Alexandra Ice, RN Phone Number: 03/19/2024, 9:37 AM   Clinical Narrative:     Patient to discharge today to CIR today.   Final next level of care: IP Rehab Facility Barriers to Discharge: Barriers Resolved   Patient Goals and CMS Choice            Discharge Placement              Patient chooses bed at: Other - please specify in the comment section below: [facility])     Patient and family notified of of transfer: 03/19/24  Discharge Plan and Services Additional resources added to the After Visit Summary for       Post Acute Care Choice: IP Rehab            DME Agency: NA       HH Arranged: NA          Social Drivers of Health (SDOH) Interventions SDOH Screenings   Food Insecurity: No Food Insecurity (03/15/2024)  Housing: Low Risk  (03/15/2024)  Transportation Needs: No Transportation Needs (03/15/2024)  Utilities: Not At Risk (03/15/2024)  Depression (PHQ2-9): Low Risk  (01/19/2021)  Financial Resource Strain: Low Risk  (07/30/2023)   Received from [facility]  Physical Activity: Insufficiently Active (12/28/2022)   Received from [facility]  Social Connections: Socially Integrated (12/28/2022)   [facility] from [facility]  Stress: No Stress Concern Present (12/28/2022)   Received from [facility]  Tobacco Use: Low Risk  (03/15/2024)     Readmission Risk Interventions     No data to display

## 2024-03-19 NOTE — Progress Notes (Deleted)
   REFERRING PHYSICIAN:  Eartha Gold, Md [address] [place],  [place] [address]  DOS: 03/15/24 PSF C3-C7 with C4-C6 decompression  HISTORY OF PRESENT ILLNESS: Eileen Chaney is 2 weeks status post above surgery.   Post-operatively, she developednew acute left arm weakness with pain. CT and MRI of the cervical spine did not reveal any ongoing compression, malplaced hardware or fluid collection .  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 Her pain improved with medications and time, but her weakness remained present.   She was sent to inpatient rehab on POD #4.       PHYSICAL EXAMINATION:  NEUROLOGICAL:  General: In no acute distress.   Awake, alert, oriented to person, place, and time.  Pupils equal round and reactive to light.  Facial tone is symmetric.    Strength: Side Biceps Triceps Deltoid Interossei Grip Wrist Ext. Wrist Flex.  R 4+*** 4+*** 4*** 4-*** 4-*** 4-*** 4-***  L 3*** 3*** 2*** 4-*** 4-*** 4-*** 4-***   Incision c/d/i  Imaging:  Nothing new to review.   Assessment / Plan: Eileen Chaney is doing fair*** s/p above surgery. Treatment options reviewed with patient and following plan made:   - We discussed activity escalation and I have advised the patient to lift up to 10 pounds until 6 weeks after surgery (until follow up with Dr. Mont Antis).   - Reviewed wound care.  - Continue current medications including *** - Preop nasal swab was positive for staph- patient is to continue mupirocin and chloraprep x 6 weeks.  - Follow up as scheduled in 4 weeks and prn.   Advised to contact the office if any questions or concerns arise.   Lucetta Russel PA-C Dept of Neurosurgery

## 2024-03-19 NOTE — Discharge Summary (Signed)
 Discharge Summary  Patient ID: Eileen Chaney MRN: [id number] DOB/AGE: [birth year] 50 y.o.  Admit date: 03/15/2024 Discharge date: 03/19/2024  Admission Diagnoses: G95.9 Cervical myelopathy , M48.02 Cervical spinal stenosis , M54.12 Cervical radiculopathy  Discharge Diagnoses:  Principal Problem:   S/P cervical spinal fusion Active Problems:   Cervical myelopathy (HCC)   Spinal stenosis in cervical region   Cervical radiculopathy   Discharged Condition: fair  Hospital Course:  Eileen Chaney is a 48 y.o presenting with cervical radiculopathy and myelopathy s/p C3-7 PSF, C4-6 decompression. Her intraoperative course was uncomplicated. She was admitted for drain output monitoring and pain control. Post-operative she delivered new acute left arm weakness with pain. CT and MRI of the cervical spine did not reveal any ongoing compression, malplaced hardware or fluid collection as an explanation. 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 Her pain improved with medications and time however has not resolved and her weakness remained present. She was deemed appropriate for discharge to inpatient rehab in order to continue to work on her pain, strength, and mobility. Her drain output decreased as expected and was removed on POD 3. She was accepted to rehab on POD4.  Daily dressing changes were recommended for 3 days after discharge at which time her incision can be left open to air.  Consults: None  Significant Diagnostic Studies:   Other tests/results:  UA. Negative    03/16/24 MRI C spine FINDINGS: Alignment: Straightening of the normal cervical lordosis. No significant listhesis.   Vertebrae: Redemonstrated anterior cervical fusion spanning C3-C5. Interval decompressive laminectomy from C4-C6. Interval placement of posterior instrumented fusion spanning C3-C7. Modic  type 1 degenerative endplate changes at C5-6 with mild discogenic edema. Vertebral body heights are maintained. No evidence of fracture.   Cord: Normal signal and morphology.   Posterior Fossa, vertebral arteries, paraspinal tissues: The visualized posterior fossa is unremarkable. Bilateral vertebral artery flow voids visualized. Postsurgical changes in the midline paraspinal soft tissues. There is edema throughout the subcutaneous tissues and paraspinal musculature of the cervical spine likely reflecting postsurgical changes. Postoperative fluid within the dorsal epidural soft tissues from C4-5 to C6-7 without significant impingement upon the thecal sac.   Disc levels:   C2-3: Diffuse disc bulge which indents the ventral thecal sac. Thickening of the ligamentum flavum. Moderate spinal canal stenosis. Bilateral facet arthrosis. No significant foraminal stenosis.   C3-4: Disc osteophyte complex slightly eccentric to the left which indents the ventral thecal sac flattening of the ventral cervical cord. Thickening of the ligamentum flavum. Moderate to severe spinal canal stenosis. There is subtle cord compression at this level without definite cord signal abnormality. Bilateral facet arthrosis and uncovertebral hypertrophy. Severe bilateral foraminal stenosis.   C4-5: Postsurgical changes at this level. Mild chronic volume loss of the cord at this level without definite cord signal abnormality or cord compression. Bilateral facet arthrosis and uncovertebral hypertrophy. There is mild right and moderate left foraminal stenosis.   C5-6: Disc osteophyte complex eccentric to the left which indents the ventral thecal sac without contacting the spinal cord. Posterior decompression. No significant spinal canal stenosis. Bilateral facet arthrosis and uncovertebral hypertrophy. Severe bilateral foraminal stenosis.   C6-7: Disc osteophyte complex indents the ventral thecal sac with subtle  flattening of the ventral cervical cord. Posterior decompression. Bilateral facet arthrosis and uncovertebral hypertrophy. Moderate to severe right and severe left foraminal stenosis.   C7-T1:  Disc osteophyte complex indents the ventral thecal sac with mild flattening of the ventral cervical cord. Thickening of the ligamentum flavum. Moderate spinal canal stenosis. Bilateral facet arthrosis. Mild bilateral foraminal stenosis.   IMPRESSION: Interval postsurgical changes of posterior decompression from C4-C6 with posterior instrumented fusion spanning C3-C7.   Degenerative changes as above. Moderate to severe spinal canal stenosis at C3-4 with mild cord compression without definite cord signal abnormality.   Multilevel foraminal stenosis, greatest and severe bilaterally at C3-4 and C5-6 and on the left at C6-7. Moderate to severe spinal canal stenosis on the right at C6-7.   Degenerative endplate changes and mild discogenic edema at C6-7 which may contribute to neck pain.   Fluid along the dorsal aspect of the spinal canal from C4-5 to C6-7 which is likely postoperative. Surgical drain noted in this region.     Electronically Signed   By: Denny Flack M.D.   On: 03/16/2024 13:24   CT C spine 03/16/24 FINDINGS: Alignment: Alignment is grossly anatomic.   Skull base and vertebrae: Prior C3-4 and C4-5 ACDF, with stable appearance of the orthopedic hardware. Interval C4-C6 laminectomies, with posterior fusion hardware spanning C4 through C7. Intrapedicular screws are seen on the left from C3 through C7 and on the right at C3, C4, C6, and C7. There are lucencies through the superior aspects of the right C5 and C6 facets as well as the left C4, C5, C6, and C7 facets, likely ghost holes from attempted hardware placement. There are no acute displaced fractures.   Soft tissues and spinal canal: Postsurgical changes are seen within the midline soft tissues of posterior neck from  recent decompressive laminectomy. Surgical drain is identified along the dorsal margin of the central canal from C4 through C6. Soft tissue gas and fluid are seen along the deep margins of the posterior incision, likely representing postoperative seroma or hematoma. This is better seen on recent MRI.   Disc levels: Prior C3-4 and C4-5 ACDF. Moderate spondylosis at C5-6 and C6-7. Adequate decompression of the central canal after laminectomy spanning C4 through C6.   Upper chest: Airway is patent.  Lung apices are clear.   Other: Reconstructed images demonstrate no additional findings.   IMPRESSION: 1. Stable C3-4 and C4-5 ACDF, with no change in orthopedic hardware. 2. Recent postsurgical changes from decompressive laminectomy and posterior fusion spanning C3 through C7, with anatomic alignment. 3. Subcutaneous gas and fluid along the deep margin of the posterior midline incision, consistent with postoperative seroma or hematoma. Surgical drain is seen along the dorsal margin of the central canal. 4. No acute displaced fracture.     Electronically Signed   By: Bobbye Burrow M.D.   On: 03/16/2024 20:20  Treatments: surgery:  as above. Please see separately dictated operative report for further details. She was also given a short course of IV DepoMedrol  Discharge Exam: Blood pressure 108/74, pulse 88, temperature 98 F (36.7 C), temperature source Oral, resp. rate 18, height 5' 8 (1.727 m), weight 105.2 kg, SpO2 96%.   AA Ox3 CNI   Strength:5/5 throughout BLE   Side Biceps Triceps Deltoid Interossei Grip Wrist Ext. Wrist Flex.  R 4+ 4+ 4 4- 4- 4- 4-  L 3 3 2  4- 4- 4- 4-    Incision intake. 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   Dilaudid  [hydromorphone  Hcl] Itching   Lactose Other (See Comments)        Medication List     STOP taking these medications    albuterol  108 (90 Base) MCG/ACT inhaler Commonly known as: VENTOLIN  HFA   atorvastatin  40 MG tablet Commonly known as: LIPITOR   BC HEADACHE POWDER PO   famotidine  40 MG tablet Commonly known as: PEPCID    LYSINE PO   montelukast  10 MG tablet Commonly known as: SINGULAIR    Naltrexone HCl (Pain) 4.5 MG Caps   naproxen sodium 220 MG tablet Commonly known as: ALEVE   orphenadrine 100 MG tablet Commonly known as: NORFLEX       TAKE these medications    balanced salts Soln Inject 15 mLs into the eye once for 1 dose.   cetirizine 10 MG tablet Commonly known as: ZYRTEC Take 10 mg by mouth in the morning and at bedtime.   chlorhexidine  4 % external liquid Commonly known as: HIBICLENS  Apply 15 mLs (1 Application total) topically as directed for 30 doses. Use as directed daily for 5 days every other week for 6 weeks.   Cortizone-10 Intensve Moisture 1 % Generic drug: hydrocortisone cream Apply 1 Application topically 2 (two) times daily as needed (skin irritation/itching.).   desvenlafaxine 50 MG 24 hr tablet Commonly known as: PRISTIQ Take 50 mg by mouth every evening.   diazepam  5 MG tablet Commonly known as: VALIUM  Take 1 tablet (5 mg total) by mouth every 8 (eight) hours as needed for muscle spasms.   diphenhydrAMINE  25 mg capsule Commonly known as: BENADRYL  Take 1 capsule (25 mg total) by mouth every 6 (six) hours as needed for itching.   docusate sodium 100 MG capsule Commonly known as: COLACE Take 1 capsule (100 mg total) by mouth 2 (two) times daily.    EPINEPHrine  0.3 mg/0.3 mL Soaj injection Commonly known as: EPI-PEN Inject 0.3 mg into the muscle as needed for anaphylaxis.   fluticasone  50 MCG/ACT nasal spray Commonly known as: FLONASE  Place 2 sprays into both nostrils at bedtime.   folic acid 1 MG tablet Commonly known as: FOLVITE Take 1 mg by mouth daily.   gabapentin  300 MG capsule Commonly known as: NEURONTIN  Take 1 capsule (300 mg total) by mouth 3 (three) times daily. What changed:  when to take this reasons to take this   HYDROmorphone  2 MG tablet Commonly known as: DILAUDID  Take 1-2 tablets (2-4 mg total) by mouth every 4 (four) hours as needed for moderate pain (pain score 4-6) or severe pain (pain score 7-10).   hydrOXYzine  50 MG tablet Commonly known as: ATARAX  Take 50 mg by mouth at bedtime as needed (sleep).   Methocarbamol  1000 MG Tabs Take 1,000 mg by mouth every 6 (six) hours.   methotrexate 50 MG/2ML injection Inject 15 mg into the skin every Saturday.   methylphenidate 20 MG tablet Commonly known as: RITALIN Take 20 mg by mouth in the morning.   minoxidil 2.5 MG tablet Commonly known as: LONITEN Take 2.5 mg by mouth in the morning.  mometasone 0.1 % cream Commonly known as: ELOCON Apply 1 Application topically daily.   multivitamin with minerals Tabs tablet Take 1 tablet by mouth in the morning.   mupirocin ointment 2 % Commonly known as: BACTROBAN Place 1 Application into the nose 2 (two) times daily for 60 doses. Use as directed 2 times daily for 5 days every other week for 6 weeks.   neomycin-bacitracin-polymyxin Oint Commonly known as: NEOSPORIN Apply 1 Application topically as needed for wound care (to face if needed).   pregabalin 75 MG capsule Commonly known as: LYRICA Take 75 mg by mouth 2 (two) times daily.   senna 8.6 MG Tabs tablet Commonly known as: SENOKOT Take 1 tablet (8.6 mg total) by mouth 2 (two) times daily.   spironolactone  100 MG tablet Commonly known as:  ALDACTONE  Take 100 mg by mouth 2 (two) times daily.   tirzepatide 15 MG/0.5ML Pen Commonly known as: MOUNJARO Inject 15 mg into the skin every Saturday.   Vitamin D (Ergocalciferol) 1.25 MG (50000 UNIT) Caps capsule Commonly known as: DRISDOL Take 50,000 Units by mouth every Saturday.               Discharge Care Instructions  (From admission, onward)           Start     Ordered   03/19/24 0000  Discharge wound care:       Comments: Daily dressing changes x3 days then leave incision open to air   03/19/24 0928             Signed: Noble Bateman 03/19/2024, 9:28 AM

## 2024-03-19 NOTE — Discharge Instructions (Addendum)
 Inpatient Rehab Discharge Instructions  Eileen Chaney Discharge date and time: No discharge date for patient encounter.   Activities/Precautions/ Functional Status: Activity: As tolerated with cervical collar Diet: Regular Wound Care: Routine skin checks Functional status:  ___ No restrictions     ___ Walk up steps independently ___ 24/7 supervision/assistance   ___ Walk up steps with assistance ___ Intermittent supervision/assistance  ___ Bathe/dress independently ___ Walk with walker     _x__ Bathe/dress with assistance ___ Walk Independently    ___ Shower independently ___ Walk with assistance    ___ Shower with assistance ___ No alcohol     ___ Return to work/school ________  Special Instructions: No driving smoking or alcohol  Follow-up Dr. Katrina for skin check of incision site early next week  COMMUNITY REFERRALS UPON DISCHARGE:     Outpatient: PT   &    OT             Agency:[facility]  [address] [place] [address] Phone:[phone]              Appointment Date/Time:WILL CALL TO SET UP FOLLOW UP APPOINTMENTS  Medical Equipment/Items Ordered:ROLLATOR   PT WILL GET TUB BENCH ON OWN                                                 Agency/Supplier:[facility]  [phone]     My questions have been answered and I understand these instructions. I will adhere to these goals and the provided educational materials after my discharge from the hospital.  Patient/Caregiver Signature _______________________________ Date __________  Clinician Signature _______________________________________ Date __________  Please bring this form and your medication list with you to all your follow-up doctor's appointments.

## 2024-03-19 NOTE — Progress Notes (Signed)
 [facility] Individual Statement of Services  Patient Name:  MYLAN LENGYEL  Date:  03/19/2024  Welcome to the [facility].  Our goal is to provide you with an individualized program based on your diagnosis and situation, designed to meet your specific needs.  With this comprehensive rehabilitation program, you will be expected to participate in at least 3 hours of rehabilitation therapies Monday-Friday, with modified therapy programming on the weekends.  Your rehabilitation program will include the following services:  Physical Therapy (PT), Occupational Therapy (OT), 24 hour per day rehabilitation nursing, Therapeutic Recreaction (TR), Neuropsychology, Care Coordinator, Rehabilitation Medicine, Nutrition Services, and Pharmacy Services  Weekly team conferences will be held on Wednesday to discuss your progress.  Your Inpatient Rehabilitation Care Coordinator will talk with you frequently to get your input and to update you on team discussions.  Team conferences with you and your family in attendance may also be held.  Expected length of stay: 12-14 days  Overall anticipated outcome: supervision-min level  Depending on your progress and recovery, your program may change. Your Inpatient Rehabilitation Care Coordinator will coordinate services and will keep you informed of any changes. Your Inpatient Rehabilitation Care Coordinator's name and contact numbers are listed  below.  The following services may also be recommended but are not provided by the Inpatient [facility]:  Driving Evaluations Home Health Rehabiltiation Services Outpatient Rehabilitation Services Vocational Rehabilitation   Arrangements will be made to provide these services after discharge if needed.  Arrangements include referral to agencies that provide these services.  Your insurance has been verified to be:  [employer] Your primary doctor is:  Alm Fritzgerald  Pertinent  information will be shared with your doctor and your insurance company.  Inpatient Rehabilitation Care Coordinator:  Rhoda Clement, KEN [phone] or ELIGAH BASQUES  Information discussed with and copy given to patient by: Clement Asberry MATSU, 03/19/2024, 1:05 PM

## 2024-03-19 NOTE — Progress Notes (Signed)
 Patient arrived from [facility] to [phone]. Patient is A&Ox4, oriented to unit. Patient complains of numbness and tingling in both hands R<L. Patient left arm in sling for support. Patient complains of pain 7/10 in left shoulder. All needs met at this time, call light within reach, bed in lowest setting.

## 2024-03-20 ENCOUNTER — Inpatient Hospital Stay (HOSPITAL_COMMUNITY)

## 2024-03-20 DIAGNOSIS — G8918 Other acute postprocedural pain: Secondary | ICD-10-CM

## 2024-03-20 DIAGNOSIS — M7989 Other specified soft tissue disorders: Secondary | ICD-10-CM | POA: Diagnosis not present

## 2024-03-20 DIAGNOSIS — I10 Essential (primary) hypertension: Secondary | ICD-10-CM

## 2024-03-20 DIAGNOSIS — K5901 Slow transit constipation: Secondary | ICD-10-CM

## 2024-03-20 MED ORDER — LIDOCAINE 5 % EX PTCH
1.0000 | MEDICATED_PATCH | CUTANEOUS | Status: DC
  Administered 2024-03-20 – 2024-03-31 (×11): 1 via TRANSDERMAL
  Filled 2024-03-20 (×13): qty 1

## 2024-03-20 MED ORDER — OXYCODONE HCL 5 MG PO TABS
5.0000 mg | ORAL_TABLET | Freq: Three times a day (TID) | ORAL | Status: DC
  Administered 2024-03-20 – 2024-04-01 (×36): 5 mg via ORAL
  Filled 2024-03-20 (×36): qty 1

## 2024-03-20 MED ORDER — MELATONIN 5 MG PO TABS
5.0000 mg | ORAL_TABLET | Freq: Every day | ORAL | Status: DC
  Administered 2024-03-20 – 2024-03-31 (×12): 5 mg via ORAL
  Filled 2024-03-20 (×13): qty 1

## 2024-03-20 NOTE — Discharge Summary (Signed)
 Physician Discharge Summary  Patient ID: Eileen Chaney MRN: [id number] DOB/AGE: 50/07/1976 50 y.o.  Admit date: 03/19/2024 Discharge date: 04/01/2024  Discharge Diagnoses:  Principal Problem:   Cervical myelopathy (HCC) Active Problems:   Anxiety with depression DVT prophylaxis Pain management Mood stabilization Hypertension Constipation Diabetes mellitus Hyperlipidemia History of asthma Class I obesity History of bilateral CTS surgery   Discharged Condition: Stable  Significant Diagnostic Studies: VAS US  LOWER EXTREMITY VENOUS (DVT) Result Date: 03/21/2024  Lower Venous DVT Study Patient Name:  Eileen Chaney  Date of Exam:   03/20/2024 Medical Rec #: [id number]          Accession #:    [id number] Date of Birth: 1976/05/02          Patient Gender: F Patient Age:   48 years Exam Location:  [facility] Procedure:      VAS US  LOWER EXTREMITY VENOUS (DVT) Referring Phys: TORIBIO PITCH --------------------------------------------------------------------------------  Indications: Swelling.  Risk Factors: None identified. Comparison Study: No prior studies. Performing Technologist: Cordella Collet RVT  Examination Guidelines: A complete evaluation includes B-mode imaging, spectral Doppler, color Doppler, and power Doppler as needed of all accessible portions of each vessel. Bilateral testing is considered an integral part of a complete examination. Limited examinations for reoccurring indications may be performed as noted. The reflux portion of the exam is performed with the patient in reverse Trendelenburg.  +---------+---------------+---------+-----------+----------+--------------+ RIGHT    CompressibilityPhasicitySpontaneityPropertiesThrombus Aging +---------+---------------+---------+-----------+----------+--------------+ CFV      Full           Yes      Yes                                 +---------+---------------+---------+-----------+----------+--------------+  SFJ      Full                                                        +---------+---------------+---------+-----------+----------+--------------+ FV Prox  Full                                                        +---------+---------------+---------+-----------+----------+--------------+ FV Mid   Full                                                        +---------+---------------+---------+-----------+----------+--------------+ FV DistalFull                                                        +---------+---------------+---------+-----------+----------+--------------+ PFV      Full                                                        +---------+---------------+---------+-----------+----------+--------------+  POP      Full           Yes      Yes                                 +---------+---------------+---------+-----------+----------+--------------+ PTV      Full                                                        +---------+---------------+---------+-----------+----------+--------------+ PERO     Full                                                        +---------+---------------+---------+-----------+----------+--------------+   +---------+---------------+---------+-----------+----------+--------------+ LEFT     CompressibilityPhasicitySpontaneityPropertiesThrombus Aging +---------+---------------+---------+-----------+----------+--------------+ CFV      Full           Yes      Yes                                 +---------+---------------+---------+-----------+----------+--------------+ SFJ      Full                                                        +---------+---------------+---------+-----------+----------+--------------+ FV Prox  Full                                                        +---------+---------------+---------+-----------+----------+--------------+ FV Mid   Full                                                         +---------+---------------+---------+-----------+----------+--------------+ FV DistalFull                                                        +---------+---------------+---------+-----------+----------+--------------+ PFV      Full                                                        +---------+---------------+---------+-----------+----------+--------------+ POP      Full           Yes      Yes                                 +---------+---------------+---------+-----------+----------+--------------+  PTV      Full                                                        +---------+---------------+---------+-----------+----------+--------------+ PERO     Full                                                        +---------+---------------+---------+-----------+----------+--------------+     Summary: RIGHT: - There is no evidence of deep vein thrombosis in the lower extremity.  - No cystic structure found in the popliteal fossa.  LEFT: - There is no evidence of deep vein thrombosis in the lower extremity.  - No cystic structure found in the popliteal fossa.  *See table(s) above for measurements and observations. Electronically signed by Penne Colorado MD on 03/21/2024 at 10:29:57 AM.    Final    CT CERVICAL SPINE WO CONTRAST Result Date: 03/16/2024 CLINICAL DATA:  Neck pain, prior cervical spine surgery EXAM: CT CERVICAL SPINE WITHOUT CONTRAST TECHNIQUE: Multidetector CT imaging of the cervical spine was performed without intravenous contrast. Multiplanar CT image reconstructions were also generated. RADIATION DOSE REDUCTION: This exam was performed according to the departmental dose-optimization program which includes automated exposure control, adjustment of the mA and/or kV according to patient size and/or use of iterative reconstruction technique. COMPARISON:  03/16/2024, 02/05/2024 FINDINGS: Alignment: Alignment is grossly anatomic. Skull base and  vertebrae: Prior C3-4 and C4-5 ACDF, with stable appearance of the orthopedic hardware. Interval C4-C6 laminectomies, with posterior fusion hardware spanning C4 through C7. Intrapedicular screws are seen on the left from C3 through C7 and on the right at C3, C4, C6, and C7. There are lucencies through the superior aspects of the right C5 and C6 facets as well as the left C4, C5, C6, and C7 facets, likely ghost holes from attempted hardware placement. There are no acute displaced fractures. Soft tissues and spinal canal: Postsurgical changes are seen within the midline soft tissues of posterior neck from recent decompressive laminectomy. Surgical drain is identified along the dorsal margin of the central canal from C4 through C6. Soft tissue gas and fluid are seen along the deep margins of the posterior incision, likely representing postoperative seroma or hematoma. This is better seen on recent MRI. Disc levels: Prior C3-4 and C4-5 ACDF. Moderate spondylosis at C5-6 and C6-7. Adequate decompression of the central canal after laminectomy spanning C4 through C6. Upper chest: Airway is patent.  Lung apices are clear. Other: Reconstructed images demonstrate no additional findings. IMPRESSION: 1. Stable C3-4 and C4-5 ACDF, with no change in orthopedic hardware. 2. Recent postsurgical changes from decompressive laminectomy and posterior fusion spanning C3 through C7, with anatomic alignment. 3. Subcutaneous gas and fluid along the deep margin of the posterior midline incision, consistent with postoperative seroma or hematoma. Surgical drain is seen along the dorsal margin of the central canal. 4. No acute displaced fracture. Electronically Signed   By: Ozell Daring M.D.   On: 03/16/2024 20:20   MR CERVICAL SPINE WO CONTRAST Result Date: 03/16/2024 CLINICAL DATA:  Cervical radiculopathy, acute left arm weakness overnight with associated pain and numbness. History of ACDF and C4-C6 decompression. EXAM: MRI  CERVICAL  SPINE WITHOUT CONTRAST TECHNIQUE: Multiplanar, multisequence MR imaging of the cervical spine was performed. No intravenous contrast was administered. COMPARISON:  Cervical spine radiograph 03/15/2024. MRI cervical spine 12/30/2023. FINDINGS: Alignment: Straightening of the normal cervical lordosis. No significant listhesis. Vertebrae: Redemonstrated anterior cervical fusion spanning C3-C5. Interval decompressive laminectomy from C4-C6. Interval placement of posterior instrumented fusion spanning C3-C7. Modic type 1 degenerative endplate changes at C5-6 with mild discogenic edema. Vertebral body heights are maintained. No evidence of fracture. Cord: Normal signal and morphology. Posterior Fossa, vertebral arteries, paraspinal tissues: The visualized posterior fossa is unremarkable. Bilateral vertebral artery flow voids visualized. Postsurgical changes in the midline paraspinal soft tissues. There is edema throughout the subcutaneous tissues and paraspinal musculature of the cervical spine likely reflecting postsurgical changes. Postoperative fluid within the dorsal epidural soft tissues from C4-5 to C6-7 without significant impingement upon the thecal sac. Disc levels: C2-3: Diffuse disc bulge which indents the ventral thecal sac. Thickening of the ligamentum flavum. Moderate spinal canal stenosis. Bilateral facet arthrosis. No significant foraminal stenosis. C3-4: Disc osteophyte complex slightly eccentric to the left which indents the ventral thecal sac flattening of the ventral cervical cord. Thickening of the ligamentum flavum. Moderate to severe spinal canal stenosis. There is subtle cord compression at this level without definite cord signal abnormality. Bilateral facet arthrosis and uncovertebral hypertrophy. Severe bilateral foraminal stenosis. C4-5: Postsurgical changes at this level. Mild chronic volume loss of the cord at this level without definite cord signal abnormality or cord compression. Bilateral  facet arthrosis and uncovertebral hypertrophy. There is mild right and moderate left foraminal stenosis. C5-6: Disc osteophyte complex eccentric to the left which indents the ventral thecal sac without contacting the spinal cord. Posterior decompression. No significant spinal canal stenosis. Bilateral facet arthrosis and uncovertebral hypertrophy. Severe bilateral foraminal stenosis. C6-7: Disc osteophyte complex indents the ventral thecal sac with subtle flattening of the ventral cervical cord. Posterior decompression. Bilateral facet arthrosis and uncovertebral hypertrophy. Moderate to severe right and severe left foraminal stenosis. C7-T1: Disc osteophyte complex indents the ventral thecal sac with mild flattening of the ventral cervical cord. Thickening of the ligamentum flavum. Moderate spinal canal stenosis. Bilateral facet arthrosis. Mild bilateral foraminal stenosis. IMPRESSION: Interval postsurgical changes of posterior decompression from C4-C6 with posterior instrumented fusion spanning C3-C7. Degenerative changes as above. Moderate to severe spinal canal stenosis at C3-4 with mild cord compression without definite cord signal abnormality. Multilevel foraminal stenosis, greatest and severe bilaterally at C3-4 and C5-6 and on the left at C6-7. Moderate to severe spinal canal stenosis on the right at C6-7. Degenerative endplate changes and mild discogenic edema at C6-7 which may contribute to neck pain. Fluid along the dorsal aspect of the spinal canal from C4-5 to C6-7 which is likely postoperative. Surgical drain noted in this region. Electronically Signed   By: Donnice Mania M.D.   On: 03/16/2024 13:24   DG Cervical Spine 2-3 Views Result Date: 03/16/2024 CLINICAL DATA:  Posterior cervical fusion with foramina me C3-7. EXAM: CERVICAL SPINE - 2-3 VIEW COMPARISON:  Cervical spine CT 01/29/2024.  MRI 12/30/2023. FINDINGS: C-arm fluoroscopy was provided in the operating room without the presence of a  radiologist.10.9 seconds fluoroscopy time. 1.38 mGy air kerma. Six C-arm fluoroscopic images were obtained intraoperatively and are submitted for post operative interpretation. Images demonstrate posterior fusion from C3 through C7 with laminar screws and interconnecting rods. There is no left-sided screw at C5. Previous C3-5 ACDF noted. No complications identified. Please see intraoperative findings for further detail.  IMPRESSION: Intraoperative fluoroscopic guidance for C3-7 posterior fusion. Electronically Signed   By: Elsie Perone M.D.   On: 03/16/2024 08:55   DG C-Arm 1-60 Min-No Report Result Date: 03/15/2024 Fluoroscopy was utilized by the requesting physician.  No radiographic interpretation.   DG C-Arm 1-60 Min-No Report Result Date: 03/15/2024 Fluoroscopy was utilized by the requesting physician.  No radiographic interpretation.   DG C-Arm 1-60 Min-No Report Result Date: 03/15/2024 Fluoroscopy was utilized by the requesting physician.  No radiographic interpretation.   DG C-Arm 1-60 Min-No Report Result Date: 03/15/2024 Fluoroscopy was utilized by the requesting physician.  No radiographic interpretation.    Labs:  Basic Metabolic Panel: Recent Labs  Lab 03/29/24 0505  NA 136  K 3.8  CL 106  CO2 21*  GLUCOSE 149*  BUN 6  CREATININE 0.67  CALCIUM  8.4*    CBC: Recent Labs  Lab 03/29/24 0505  WBC 9.4  NEUTROABS 5.0  HGB 9.4*  HCT 30.2*  MCV 88.8  PLT 324    CBG: No results for input(s): GLUCAP in the last 168 hours.   Brief HPI:   EZRIE BUNYAN is a 48 y.o. right-handed female with history significant for anxiety/depression as well as component of ADHD maintained on Ritalin , asthma, class I obesity with BMI 35.28 diabetes mellitus fibromyalgia hyperlipidemia hypertension posterior cervical laminectomy 01/07/2022, anterior cervical decompressive discectomy fusion 11/12/2021 complicated by cervical wound debridement 01/20/2022.  Per chart review patient lives  with daughter.  She has good support from her family.  Two-level home bed and bath on main level independent community ambulator.  Presented to [facility] 03/15/2024 with increasing difficulty with dexterity in her hands as well as poor balance.  She was having trouble using her phone as well as difficulty with buttons and clasping her necklaces.  She continued to have ongoing neck and left arm and shoulder pain with numbness as well as low back pain that extends into her buttocks.  She had denied any recent falls.  Recent imaging showed severe central canal stenosis at C4-5 C5-6 with foraminal stenosis from C3-7.  Most recent preoperative labs showed a hemoglobin of 12.8 hematocrit 40.3 on 03/03/2024 and unremarkable chemistries.  Underwent posterior segmental instrumentation of C3-7 as well as posterior lateral arthrodesis from C5-7 with cervical laminectomy from C4-C6 for decompression of the spinal cord with bilateral foraminotomies and facetectomies at C3/4 C4-5 C5-6 and left foraminotomies C6-7 03/15/2024 per Dr. Reeves Nine.  Laced in a cervical brace for comfort.  She did complete a course of Decadron  taper.  Patient control with pain with the use of Dilaudid  as well as Valium .  Lovenox  added for DVT prophylaxis 03/16/2024.  Follow-up imaging CT/MRI cervical spine 03/16/2024 due to complaints of increasing neck pain and acute left arm weakness showed noted degenerative changes moderate to severe spinal canal stenosis C3-4 with mild cord compression without definitive cord signal abnormality.  Multilevel foraminal stenosis greatest and severe bilaterally at C3-4 C5-6 on the left at C6-7.  Moderate to severe spinal canal stenosis on the right at C6-7.  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  Therapy evaluations completed due to patient decreased  functional mobility was admitted for a comprehensive rehab program.   Hospital Course: TAUHEEDAH BOK was admitted to rehab 03/19/2024 for inpatient therapies to consist of PT, ST and OT at least three hours five days a  week. Past admission physiatrist, therapy team and rehab RN have worked together to provide customized collaborative inpatient rehab.  Pertaining to patient's cervical myelopathy status post C3-7 PSF, C4-6 decompression C16 2025 with postoperative left upper extremity weakness as well as history of cervical laminectomy 01/07/2022 as well as 11/12/2021.  Cervical collar as directed surgical site clean and dry follow-up neurosurgery.  Lovenox  for DVT prophylaxis venous Doppler studies negative.  Noted bilateral CTS surgery left upper extremity status post PSF left digit 1-3 numbness on exam recommend outpatient EMG on discharge.  Pain management/Fibromyalgia initially with the use of Ritalin  Lyrica  and Dilaudid  as needed adjusted accordingly Valium  Robaxin  as needed for muscle spasms with Dilaudid  discontinued and placed on as needed oxycodone  thank.  Mood stabilization maintained on Effexor  Atarax  as advised added Elavil  at bedtime.  Patient was attending full therapies.  Emotional support provided.  Blood pressure controlled on Aldactone   monitored with increased mobility.  Bouts of constipation resolved with laxative assistance.  Diabetes mellitus hemoglobin A1c 5.8 patient on Mounjaro prior to admission.  Lipitor ongoing for hyperlipidemia.  History of asthma continued Singulair  check oxygen saturations every shift.  Class I obesity BMI 35.28 dietary follow-up.   Blood pressures were monitored on TID basis and remained controlled and monitored  Diabetes has been monitored with ac/hs CBG checks and SSI was use prn for tighter BS control.    Rehab course: During patient's stay in rehab weekly team conferences were held to monitor patient's progress, set goals and discuss barriers to  discharge. At admission, patient required contact-guard minimal assist 160 feet without assistive device contact-guard sit to stand  He/She  has had improvement in activity tolerance, balance, postural control as well as ability to compensate for deficits. He/She has had improvement in functional use RUE/LUE  and RLE/LLE as well as improvement in awareness.  Patient performing bed mobility on apartment bed with supervision, sit to stand, stand pivot transfers with rollator and supervision gait 208 feet rollator supervision, stair navigation times sixteen 6 inch steps with bilateral handrail close supervision/contact-guard.  She was somewhat impulsive at times discussed overall safety.  She ambulates throughout her room to gather sweatshirt.  Verbal cues provided to don while seated.  Verbal cues provided to use rollator with ambulation in room even with short distances.  Patient completed simulated task of dressing and donning Thera-Band using the reacher to increase range of motion for donning the Thera-Band over the shoulders and pulling it overhead to bring it on to the waist 3 times.  Full family teaching completed plan discharge to home       Disposition:  Discharge disposition: 01-Home or Self Care        Diet: Diabetic diet  Special Instructions: No driving smoking or alcohol  Follow-up with Dr. Katrina for skin check of incision site  Cervical collar as directed  Follow-up neurosurgery regarding EMG/nerve conduction study  Medications at discharge. 1.  Elavil  25 mg p.o. nightly 2.  Lipitor 40 mg p.o. daily 3.  Colace 200 mg p.o. twice daily 4.  Folic acid  1 mg p.o. daily 5.  Oxycodone  5 to 10 mg every 4 hours as needed pain 6.  Robaxin  1000 mg every 6 hours 7.  Lidoderm  patch changes directed 8.  Ritalin  20 mg every morning 9.  Melatonin 5 mg p.o. nightly 10.  Ritalin  20 mg p.o. daily 11.  Metamucil 1 packet twice a day 12.  Singulair  10 mg p.o. nightly 13.  Lyrica   150 mg  p.o. 3 times daily 14.  Pristiq 50 mg every evening 15.  Vitamin D  50,000 units every Saturday 16.  MiraLAX  daily hold for loose stools 17.  Protonix  40 mg daily 18.  Multivitamin daily 19.  Zyrtec daily 20.  Flonase  2 sprays into both nostrils at bedtime 21.  Atarax  50 mg bedtime as needed sleep 22.  Methotrexate 15 mg into the skin every Saturday 23.  Minoxidil  2.5 mg every morning 24.  Aldactone  100 mg twice daily 25.  Mounjaro 15 mg into the skin every Saturday  30-35 minutes were spent completing discharge summary and discharge planning Discharge Instructions     Ambulatory referral to Physical Medicine Rehab   Complete by: As directed    Moderate complexity follow-up 1 to 2 weeks cervical myelopathy        Follow-up Information     Urbano Albright, MD Follow up.   Specialty: Physical Medicine and Rehabilitation Why: Office to call for appointment Contact information: [address] [place] [address] [phone]         Clois Fret, MD Follow up.   Specialty: Neurosurgery Why: Call for appointment Contact information: [address] [place] [address] [phone]                 Signed: Toribio JINNY Pitch 04/01/2024, 4:43 AM

## 2024-03-20 NOTE — Plan of Care (Signed)
  Problem: RH Balance Goal: LTG Patient will maintain dynamic standing with ADLs (OT) Description: LTG:  Patient will maintain dynamic standing balance with assist during activities of daily living (OT)  Flowsheets (Taken 03/20/2024 1633) LTG: Pt will maintain dynamic standing balance during ADLs with: Independent with assistive device   Problem: RH Eating Goal: LTG Patient will perform eating w/assist, cues/equip (OT) Description: LTG: Patient will perform eating with assist, with/without cues using equipment (OT) Flowsheets (Taken 03/20/2024 1633) LTG: Pt will perform eating with assistance level of: Independent with assistive device    Problem: RH Grooming Goal: LTG Patient will perform grooming w/assist,cues/equip (OT) Description: LTG: Patient will perform grooming with assist, with/without cues using equipment (OT) Flowsheets (Taken 03/20/2024 1633) LTG: Pt will perform grooming with assistance level of: Independent with assistive device    Problem: RH Bathing Goal: LTG Patient will bathe all body parts with assist levels (OT) Description: LTG: Patient will bathe all body parts with assist levels (OT) Flowsheets (Taken 03/20/2024 1633) LTG: Pt will perform bathing with assistance level/cueing: Minimal Assistance - Patient > 75% LTG: Position pt will perform bathing:  Shower  At sink   Problem: RH Dressing Goal: LTG Patient will perform upper body dressing (OT) Description: LTG Patient will perform upper body dressing with assist, with/without cues (OT). Flowsheets (Taken 03/20/2024 1633) LTG: Pt will perform upper body dressing with assistance level of: Minimal Assistance - Patient > 75% Goal: LTG Patient will perform lower body dressing w/assist (OT) Description: LTG: Patient will perform lower body dressing with assist, with/without cues in positioning using equipment (OT) Flowsheets (Taken 03/20/2024 1633) LTG: Pt will perform lower body dressing with assistance level of:  Minimal Assistance - Patient > 75%   Problem: RH Toileting Goal: LTG Patient will perform toileting task (3/3 steps) with assistance level (OT) Description: LTG: Patient will perform toileting task (3/3 steps) with assistance level (OT)  Flowsheets (Taken 03/20/2024 1633) LTG: Pt will perform toileting task (3/3 steps) with assistance level: Minimal Assistance - Patient > 75%   Problem: RH Toilet Transfers Goal: LTG Patient will perform toilet transfers w/assist (OT) Description: LTG: Patient will perform toilet transfers with assist, with/without cues using equipment (OT) Flowsheets (Taken 03/20/2024 1633) LTG: Pt will perform toilet transfers with assistance level of: Independent with assistive device   Problem: RH Tub/Shower Transfers Goal: LTG Patient will perform tub/shower transfers w/assist (OT) Description: LTG: Patient will perform tub/shower transfers with assist, with/without cues using equipment (OT) Flowsheets (Taken 03/20/2024 1633) LTG: Pt will perform tub/shower stall transfers with assistance level of: Supervision/Verbal cueing LTG: Pt will perform tub/shower transfers from:  Tub/shower combination  Walk in shower

## 2024-03-20 NOTE — Progress Notes (Signed)
 PROGRESS NOTE   Subjective/Complaints:  Pt doing so-so today. States she hasn't been sleeping well because meds have been off schedule-- not coming for hours after being requested. Pain control hasn't been well achieved during hospital stay, prior to arrival at [facility]. Last night, slept poorly d/t pain, but eventually did get some sleep. Pain ok currently, but activity really sets it off.  LBM 2 days ago, somewhat abnormal for her, but doesn't want to do anything today about it yet. Will reassess tomorrow.  Urinating fine. No other complaints or concerns.   ROS: as per HPI. Denies CP, SOB, abd pain, N/V/D, or any other complaints at this time.    Objective:   No results found. Recent Labs    03/19/24 1246  WBC 12.3*  HGB 10.1*  HCT 30.9*  PLT 308   Recent Labs    03/19/24 1246  CREATININE 0.74       Intake/Output Summary (Last 24 hours) at 03/20/2024 1151 Last data filed at 03/19/2024 1440 Gross per 24 hour  Intake 120 ml  Output --  Net 120 ml        Physical Exam: Vital Signs Blood pressure 116/73, pulse 95, temperature 98.1 F (36.7 C), temperature source Oral, resp. rate 18, height 5' 8 (1.727 m), SpO2 96%.  Constitutional: No apparent distress. Appropriate appearance for age. Resting in bed HENT: No JVD. No soft collar on currently.  Eyes: PERRLA. EOMI. Visual fields grossly intact. + glasses Cardiovascular: RRR, no murmurs/rub/gallops. No Edema. Peripheral pulses 2+  Respiratory: CTAB. No rales, rhonchi, or wheezing. On RA.  Abdomen: + bowel sounds, normoactive. No distention or tenderness.  Skin: C/D/I. No apparent lesions. Neuro: A&O x4   PRIOR EXAMS: MSK:      No apparent deformity. + LUE sling for comfort      + TTP L posterior shoulder - no palpable deformity   Neurologic exam:  Cognition: AAO to person, place, time and event.  Language: Fluent, No substitutions or neoglisms. No dysarthria.  Names 3/3 objects correctly.  Memory: Recalls 3/3 objects at 5 minutes. No apparent deficits  Insight: Good  insight into current condition.  Mood: Pleasant affect, appropriate mood.  Sensation: To light touch intact in BL LE; LUE diffusely reduced compared to LUE; absent digits 1-3   Reflexes: 0 BL UE, 0  L patella; 2+ R patella. Negative Hoffman's and babinski signs bilaterally.   CN: 2-12 grossly intact.  Coordination: No apparent tremors. No ataxia  Spasticity: MAS 0 in all extremities.       Strength:                RUE: 5-/5 SA, 5-/5 EF, 5-/5 EE, 5-/5 WE, 5-/5 FF, 5-/5 FA                LUE:  1/5 SA, 3/5 EF, 3/5 EE, 3/5 WE, 4/5 FF, 4/5 FA                RLE: 5/5 HF, 5/5 KE, 5/5  DF, 5/5  EHL, 5/5  PF                 LLE:  5/5 HF, 5/5  KE, 5/5  DF, 5/5  EHL, 5/5  PF   Assessment/Plan: 1. Functional deficits which require 3+ hours per day of interdisciplinary therapy in a comprehensive inpatient rehab setting. Physiatrist is providing close team supervision and 24 hour management of active medical problems listed below. Physiatrist and rehab team continue to assess barriers to discharge/monitor patient progress toward functional and medical goals  Care Tool:  Bathing              Bathing assist       Upper Body Dressing/Undressing Upper body dressing        Upper body assist      Lower Body Dressing/Undressing Lower body dressing            Lower body assist       Toileting Toileting    Toileting assist       Transfers Chair/bed transfer  Transfers assist           Locomotion Ambulation   Ambulation assist              Walk 10 feet activity   Assist           Walk 50 feet activity   Assist           Walk 150 feet activity   Assist           Walk 10 feet on uneven surface  activity   Assist           Wheelchair     Assist               Wheelchair 50 feet with 2 turns  activity    Assist            Wheelchair 150 feet activity     Assist          Blood pressure 116/73, pulse 95, temperature 98.1 F (36.7 C), temperature source Oral, resp. rate 18, height 5' 8 (1.727 m), SpO2 96%.  Medical Problem List and Plan: 1. Functional deficits secondary to cervical myelopathy status post C3-7 PSF, C4-6 decompression 03/15/2024 with postoperative left upper extremity weakness as well as history of cervical laminectomy 01/07/2022 as well as 11/12/2021.  Cervical collar as directed             -patient may shower with incisions covered              -ELOS/Goals: 10-12 days, Min A PT, OT             -Continue CIR   2.  Antithrombotics: -DVT/anticoagulation:  Pharmaceutical: Lovenox  40mg  daily, check vascular study             -antiplatelet therapy: N/A 3. Pain Management: Lyrica  150 mg TID, Dilaudid  2 mg every 4 hours as moderate pain needed and 4 mg every 4 hours as needed severe pain, Robaxin  1000 mg PO every 6 hours, Valium  5mg  q8h as needed muscle spasms              - Patient with slight allergy to dilaudid , has benadryll 35 mg Q6H PRN  - Poorly controlled L posterior shoulder neuropathic pain - added elavil  25 mg at bedtime for pain/sleep + oxycodone  5 mg TID (no allergy prior, scheduled before therapies/bed). Did fail pain management with oxycodone  IR prior to dilaudid . -03/20/24 pain management still an issue, mostly related to doses not coming on time -changed scheduled oxy 5mg  IR to 0600/1300/2100 to have it on off-hours but related to  PT schedule and sleep schedule.  -ordered Kpad -Lidoderm  patches for L shoulder -advised her to request meds before she is in severe pain, stay on top of pain control-- advised if all meds available to her have been given but still requiring more pain control, have them call the on call provider-- hopefully not needed with new plan.    4. Mood/Behavior/Sleep/ADHD: Ritalin  20 mg every morning, Effexor  75 mg daily,  Atarax  50 mg nightly as needed sleep             -antipsychotic agents: N/A              - added elavil  25 mg at bedtime  -03/20/24 added melatonin 5mg  nightly-- home med too   5. Neuropsych/cognition: This patient is capable of making decisions on her own behalf. 6. Skin/Wound Care: Routine skin checks 7. Fluids/Electrolytes/Nutrition: Routine in and outs with follow-up chemistries, continue vitamin/supp 8.  Hypertension.  Aldactone  100 mg twice daily, minoxidil  2.5 mg daily.  Monitor with increased mobility  -03/20/24 BPs fine, monitor Vitals:   03/19/24 1231 03/19/24 2006 03/20/24 0519  BP: 116/80 99/62 116/73    9.  Constipation.  Colace 100 mg twice daily, MiraLAX  daily as needed, senna 8.6mg  BID -03/20/24 no BM in 2 days per pt, but doesn't want to adjust meds yet-- if no BM by tomorrow, adjust further 10.  Diabetes mellitus.  Hemoglobin A1c 5.8.  Blood sugars 103-94-106-134.  Patient on Mounjaro prior to admission and resume at discharge. No need for SSI/CBG checks.  11.  Hyperlipidemia.  Lipitor 40 mg daily 12.  History of asthma.  Singulair  10 mg daily. Claritin  10mg  daily. Flonase  2 sprays nightly. Check oxygen saturations every shift 13.  Class I obesity.  BMI 35.28.  Dietary follow-up  14. Hx b/l CTS surgery; LUE weakness s/p PSF. L digit 1-3 numbness on exam, recommend OP EMG on discharge.  15. Vit D Deficiency: vit D supp 50k U weekly  I spent >4mins performing patient care related activities, including prolonged face to face time, documentation time, med management, discussion of meds with patient and nursing staff, and overall coordination of care.     LOS: 1 days A FACE TO FACE EVALUATION WAS PERFORMED  [address] 03/20/2024, 11:51 AM

## 2024-03-20 NOTE — Progress Notes (Signed)
 Occupational Therapy Session Note  Patient Details  Name: Eileen Chaney MRN: [id number] Date of Birth: 07/11/1976  Today's Date: 03/20/2024 OT Individual Time: 1302-1400 OT Individual Time Calculation (min): 58 min    Short Term Goals: Week 1:  OT Short Term Goal 1 (Week 1): Patient will direct position of left arm for comfort during self care activity OT Short Term Goal 2 (Week 1): Patient will utilize left hand to wash thighs, right forearm with min assist OT Short Term Goal 3 (Week 1): Patient will  complete toilet trasnfer with close supervision OT Short Term Goal 4 (Week 1): Patient will complete toileting with mod assist  Skilled Therapeutic Interventions/Progress Updates:  Patient received sitting up in bed finishing her lunch.  Patient agreeable to get dressed.  Sat at edge of bed to don shorts and tshirt.  Patient needs max assist to manage overhead shirt - has difficulty manipulating left hand into sleeve, and difficulty using RUE to pull shirt overhead.  Patient assisted to chair and reports pain better in supported position.  Patient transferred to wheelchair and secured half lap tray for support of LUE - built up surface with pillow and folded towel.   Transported to gym to address UE movement - proximal movement limited in BUE moreso in LUE.  Applied moist heat to left shoulder - reported pain in lateral neck - moved pack to lateral neck superior shoulder.  Obtained reclining wheelchair for patient with headrest to offer more head/neck support.  Patient returned to room at end of session and agreeable to attempt to sit in reclined position x 30 min- 1 hour.  Personal items in reach.    Therapy Documentation Precautions:  Precautions Precautions: Cervical, Fall Required Braces or Orthoses: Cervical Brace Cervical Brace: For comfort Restrictions Weight Bearing Restrictions Per Provider Order: No  Pain: Pain Assessment Pain Scale: 0-10 Pain Score: 8  Pain Type: Acute  pain Pain Location: Shoulder Pain Orientation: Left Pain Descriptors / Indicators: Burning Pain Onset: On-going Patients Stated Pain Goal: 0 Pain Intervention(s): Medication (See eMAR);Shower;Hot/Cold interventions Multiple Pain Sites: No     Therapy/Group: Individual Therapy  Fernando Stoiber M 03/20/2024, 4:39 PM

## 2024-03-20 NOTE — Plan of Care (Signed)
  Problem: Consults Goal: RH GENERAL PATIENT EDUCATION Description: See Patient Education module for education specifics. Outcome: Progressing Goal: Skin Care Protocol Initiated - if Braden Score 18 or less Description: If consults are not indicated, leave blank or document N/A Outcome: Progressing   Problem: RH BOWEL ELIMINATION Goal: RH STG MANAGE BOWEL WITH ASSISTANCE Description: STG Manage Bowel with Assistance. Outcome: Progressing Goal: RH STG MANAGE BOWEL W/MEDICATION W/ASSISTANCE Description: STG Manage Bowel with Medication with Assistance. Outcome: Progressing   Problem: RH BLADDER ELIMINATION Goal: RH STG MANAGE BLADDER WITH ASSISTANCE Description: STG Manage Bladder With Assistance Outcome: Progressing Goal: RH STG MANAGE BLADDER WITH EQUIPMENT WITH ASSISTANCE Description: STG Manage Bladder With Equipment With Assistance Outcome: Progressing   Problem: RH SKIN INTEGRITY Goal: RH STG SKIN FREE OF INFECTION/BREAKDOWN Outcome: Progressing Goal: RH STG MAINTAIN SKIN INTEGRITY WITH ASSISTANCE Description: STG Maintain Skin Integrity With Assistance. Outcome: Progressing   Problem: RH SAFETY Goal: RH STG ADHERE TO SAFETY PRECAUTIONS W/ASSISTANCE/DEVICE Description: STG Adhere to Safety Precautions With Assistance/Device. Outcome: Progressing Goal: RH STG DECREASED RISK OF FALL WITH ASSISTANCE Description: STG Decreased Risk of Fall With Assistance. Outcome: Progressing   Problem: RH PAIN MANAGEMENT Goal: RH STG PAIN MANAGED AT OR BELOW PT'S PAIN GOAL Outcome: Progressing   Problem: RH KNOWLEDGE DEFICIT GENERAL Goal: RH STG INCREASE KNOWLEDGE OF SELF CARE AFTER HOSPITALIZATION Outcome: Progressing

## 2024-03-20 NOTE — Evaluation (Signed)
 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, muscle weakness (generalized), and pain in joint Rehab Potential: Rehab Potential (ACUTE ONLY): Good ELOS: 12-14 days   Today's Date: 03/20/2024 OT Individual Time: 9194-9082 OT Individual Time Calculation (min): 72 min     Hospital Problem: Principal Problem:   Cervical myelopathy (HCC)   Past Medical History:  Past Medical History:  Diagnosis Date   Anxiety    Arthritis    Asthma    Cervical myelopathy with cervical radiculopathy (HCC)    Complication of anesthesia    with c sections had a had time getting her sedated   DDD (degenerative disc disease), cervical    Depression    Diabetes mellitus without complication (HCC)    type 2   Family history of adverse reaction to anesthesia    mother hard to wake up   Fibromyalgia    Hyperlipidemia    Hypertension    Idiopathic urticaria    Pneumonia    PONV (postoperative nausea and vomiting)    Undifferentiated inflammatory arthritis    Past Surgical History:  Past Surgical History:  Procedure Laterality Date   ABDOMINAL HYSTERECTOMY  2018   uterine fibroids   ANKLE SURGERY Right 2022   ligament repair   ANTERIOR CERVICAL DECOMP/DISCECTOMY FUSION N/A 11/12/2021   Procedure: C3-5 ANTERIOR CERVICAL DISCECTOMY AND FUSION (GLOBUS HEDRON);  Surgeon: Clois Fret, MD;  Location: [facility];  Service: Neurosurgery;  Laterality: N/A;   CERVICAL WOUND DEBRIDEMENT N/A 01/20/2022   Procedure: posterior wound debridment;  Surgeon: Clois Fret, MD;  Location: [facility];  Service: Neurosurgery;  Laterality: N/A;   CESAREAN SECTION     X3 2000, 2001, 2009   DILATION AND CURETTAGE OF UTERUS     POSTERIOR CERVICAL FUSION/FORAMINOTOMY N/A 03/15/2024   Procedure: POSTERIOR CERVICAL  FUSION/FORAMINOTOMY LEVEL 4;  Surgeon: Clois Fret, MD;  Location: [facility];  Service: Neurosurgery;  Laterality: N/A;  C4-7 Posterior Spinal Decompression C4-7 Posterior Spinal Instrumentation C5-7 Fusion   POSTERIOR CERVICAL LAMINECTOMY N/A 01/07/2022   Procedure: OPEN C5-7 POSTERIOR DECOMPRESSION;  Surgeon: Clois Fret, MD;  Location: [facility];  Service: Neurosurgery;  Laterality: N/A;   TONSILLECTOMY  2000   WISDOM TOOTH EXTRACTION      Assessment & Plan Clinical Impression: Eileen Chaney is a 48 year old right-handed female with history significant for anxiety/depression as well as a component of ADHD maintained on Ritalin , asthma, class I obesity with BMI 35.28, diabetes mellitus, fibromyalgia, hyperlipidemia, hypertension, posterior cervical laminectomy 01/07/2022, anterior cervical decompression/discectomy fusion 11/12/2021 per Dr. Fret Nine complicated by cervical wound debridement 01/20/2022.  Per chart review patient lives with her daughter.  She has good support from her partner daughter and son.  Two-level home bed and bath on main level.  Independent community ambulator.  [facility] with increasing difficulty with dexterity in her hands as well as poor balance.  She was having trouble using her phone as well as difficulty with buttons and clasping her necklaces .Eileen Chaney  She continued to have ongoing neck and left arm and shoulder pain with numbness as well as low back pain that extends into her buttocks..  She has denied any recent falls.  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  Most recent preoperative labs showed a hemoglobin of 12.8 and hematocrit 40.3  on 03/03/2024 and unremarkable chemistries.  Underwent posterior segmental instrumentation C3-7 as well as posterior lateral arthrodesis from C5-7 with cervical laminectomy from C4-C6 for decompression of the spinal cord with bilateral  foraminotomies and facetectomies at C3/4, C4-5, C5-6, and left foraminotomies C6-7 03/15/2024 per Dr. Reeves Nine.  Placed in a cervical brace for comfort.  She did complete a course of Decadron  taper.  Pain control with the use of Dilaudid  2 mg every 4 hours as needed as well as Valium  for spasms.  Lovenox  added for DVT prophylaxis 03/16/2024.  Follow-up imaging CT/MRI cervical spine 03/16/2024 due to complaints of increasing neck pain and acute left arm weakness showed noted degenerative changes moderate to severe spinal canal stenosis C3-4 with mild cord compression without definitive cord signal abnormality.  Multilevel foraminal stenosis greatest and severe bilaterally at C3-4 and C5-6 on the left at C6-7.  Moderate to severe spinal canal stenosis on the right at C6-7.  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  Therapy evaluations completed due to patient's decreased functional mobility was admitted for a comprehensive rehab program. Patient transferred to CIR on 03/19/2024 .    Patient currently requires max with basic self-care skills secondary to muscle weakness and abnormal tone, unbalanced muscle activation, ataxia, and decreased coordination.  Prior to hospitalization, patient could complete BADL/IADL with independent .  Patient will benefit from skilled intervention to decrease level of assist with basic self-care skills, increase independence with basic self-care skills, and increase level of independence with iADL prior to discharge home with care partner.  Anticipate patient will require minimal physical assistance and follow up home health and follow up outpatient.  OT - End of Session Activity Tolerance: Decreased this session Endurance Deficit: Yes Endurance Deficit Description: Limited by pain and meds cause drowsiness,  nausea OT Assessment Rehab Potential (ACUTE ONLY): Good OT Patient demonstrates impairments in the following area(s): Balance;Pain;Edema;Safety;Endurance;Sensory;Motor OT Basic ADL's Functional Problem(s): Eating;Grooming;Bathing;Dressing;Toileting OT Transfers Functional Problem(s): Toilet;Tub/Shower OT Additional Impairment(s): Fuctional Use of Upper Extremity OT Plan OT Intensity: Minimum of 1-2 x/day, 45 to 90 minutes OT Frequency: 5 out of 7 days OT Duration/Estimated Length of Stay: 12-14 days OT Treatment/Interventions: Balance/vestibular training;Disease mangement/prevention;Functional electrical stimulation;Neuromuscular re-education;Patient/family education;Self Care/advanced ADL retraining;Splinting/orthotics;Therapeutic Exercise;UE/LE Coordination activities;Visual/perceptual remediation/compensation;UE/LE Strength taining/ROM;Therapeutic Activities;Pain management;Functional mobility training;DME/adaptive equipment instruction;Discharge planning OT Self Feeding Anticipated Outcome(s): Mod I OT Basic Self-Care Anticipated Outcome(s): Min assist OT Toileting Anticipated Outcome(s): Min assist OT Bathroom Transfers Anticipated Outcome(s): Supervision OT Recommendation Recommendations for Other Services: Neuropsych consult Patient destination: Home Follow Up Recommendations: Home health OT;Outpatient OT Equipment Recommended: To be determined   OT Evaluation Precautions/Restrictions  Precautions Precautions: Cervical;Fall Required Braces or Orthoses: Cervical Brace Cervical Brace: For comfort   Pain Pain Assessment Pain Scale: 0-10 Pain Score: 8  Pain Type: Acute pain Pain Location: Shoulder Pain Orientation: Left Pain Descriptors / Indicators: Burning Pain Onset: On-going Patients Stated Pain Goal: 0 Pain Intervention(s): Medication (See eMAR);Shower;Hot/Cold interventions Multiple Pain Sites: No Home Living/Prior Functioning Home Living Living Arrangements:  Spouse/significant other Available Help at Discharge: Family, Available PRN/intermittently Type of Home: House Home Access: Stairs to enter [employer] of Steps: 3 stairs Entrance Stairs-Rails: Left Home Layout: Two level, Able to live on main level with bedroom/bathroom Alternate Level Stairs-Number of Steps: 15 Alternate Level Stairs-Rails: Can reach both Bathroom Shower/Tub: Health visitor: Standard Bathroom Accessibility: Yes  Lives With: Significant other IADL History Homemaking Responsibilities: Yes Meal Prep Responsibility: Primary Laundry Responsibility: Primary Cleaning Responsibility: Primary Current License: Yes Mode of Transportation: Car Occupation: Full time employment 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, 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, 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 Leisure and Hobbies: family time, son's sporting events, reading, gardening Prior Function Level of Independence: Independent with basic ADLs, Independent with gait  Able to Take Stairs?: Yes Driving: Yes Vocation: Full time employment Vision Baseline Vision/History: 1 Wears glasses Ability to See in Adequate Light: 0 Adequate Patient Visual Report: No change from baseline Vision Assessment?: No apparent visual deficits Perception  Perception: Within Functional Limits Praxis Praxis: WFL Cognition Cognition Overall Cognitive Status: Within Functional Limits for tasks assessed Arousal/Alertness: Awake/alert Orientation Level: Person;Place;Situation Person: Oriented Place: Oriented Situation: Oriented Memory: Appears intact Attention: Selective Awareness: Appears intact Problem Solving: Appears intact Safety/Judgment: Appears intact Comments: Cognition impacted by timing of medication - distracted by pain, or becomes lethargic with medicine Brief Interview for Mental Status (BIMS) Repetition of Three Words (First Attempt): 3 Temporal Orientation: Year: Correct Temporal Orientation: Month: Accurate within 5  days Temporal Orientation: Day: Correct Recall: Sock: Yes, no cue required Recall: Blue: Yes, no cue required Recall: Bed: Yes, no cue required BIMS Summary Score: 15 Sensation Sensation Light Touch: Impaired by gross assessment Light Touch Impaired Details: Impaired RLE;Impaired LLE;Impaired LUE Hot/Cold: Impaired by gross assessment Proprioception: Impaired by gross assessment Stereognosis: Not tested Coordination Gross Motor Movements are Fluid and Coordinated: No Fine Motor Movements are Fluid and Coordinated: No Motor  Motor Motor: Abnormal tone;Ataxia Motor - Skilled Clinical Observations: decrease strength lower extremities, limited proximal movement BUE L worse than R  Trunk/Postural Assessment  Cervical Assessment Cervical Assessment: Exceptions to [facility] Thoracic Assessment Thoracic Assessment: Within Functional Limits Lumbar Assessment Lumbar Assessment: Within Functional Limits Postural Control Postural Control: Deficits on evaluation (Posterior or rightward bias at times in standing)  Balance Balance Balance Assessed: Yes Static Sitting Balance Static Sitting - Balance Support: Feet supported;Right upper extremity supported Static Sitting - Level of Assistance: 5: Stand by assistance Static Standing Balance Static Standing - Balance Support: During functional activity Static Standing - Level of Assistance: 4: Min assist;5: Stand by assistance Dynamic Standing Balance Dynamic Standing - Balance Support: During functional activity Dynamic Standing - Level of Assistance: 4: Min assist Extremity/Trunk Assessment RUE Assessment RUE Assessment: Exceptions to [facility] Active Range of Motion (AROM) Comments: lacks shoulder flexion/abduction in open chain against gravity General Strength Comments: grip strength - 48lb LUE Assessment LUE Assessment: Exceptions to [facility] Active Range of Motion (AROM) Comments: has scaular elevation, limited flexion/abduction against  gravity - limited by pain, lacks supination/pronation, isolated elbow flex/ext - has full wrist flex/ext/radial/ulnar deviation, and mass grasp/release General Strength Comments: 25lb grip strength  Care Tool Care Tool Self Care Eating   Eating Assist Level: Moderate Assistance - Patient 50 - 74%    Oral Care    Oral Care Assist Level: Moderate Assistance - Patient 50 - 74%    Bathing   Body parts bathed by patient: Chest;Abdomen;Right upper leg;Left upper leg;Right lower leg;Left lower leg;Face;Front perineal area Body parts bathed by helper: Right arm;Left arm;Buttocks   Assist Level: Moderate Assistance - Patient 50 - 74%    Upper Body Dressing(including orthotics)   What is the patient wearing?: Pull over shirt   Assist Level: Maximal Assistance - Patient 25 - 49%    Lower Body Dressing (excluding footwear)   What is the patient wearing?: Pants Assist for lower body dressing: Moderate Assistance - Patient  50 - 74%    Putting on/Taking off footwear   What is the patient wearing?: Shoes Assist for footwear: Minimal Assistance - Patient > 75%       Care Tool Toileting Toileting activity Toileting Activity did not occur (Clothing management and hygiene only): N/A (no void or bm)       Care Tool Bed Mobility Roll left and right activity   Roll left and right assist level: Minimal Assistance - Patient > 75%    Sit to lying activity   Sit to lying assist level: Minimal Assistance - Patient > 75%    Lying to sitting on side of bed activity   Lying to sitting on side of bed assist level: the ability to move from lying on the back to sitting on the side of the bed with no back support.: Minimal Assistance - Patient > 75%     Care Tool Transfers Sit to stand transfer   Sit to stand assist level: Minimal Assistance - Patient > 75%    Chair/bed transfer   Chair/bed transfer assist level: Minimal Assistance - Patient > 75%     Toilet transfer Toilet transfer activity did  not occur: Refused       Care Tool Cognition  Expression of Ideas and Wants Expression of Ideas and Wants: 4. Without difficulty (complex and basic) - expresses complex messages without difficulty and with speech that is clear and easy to understand  Understanding Verbal and Non-Verbal Content Understanding Verbal and Non-Verbal Content: 4. Understands (complex and basic) - clear comprehension without cues or repetitions   Memory/Recall Ability Memory/Recall Ability : Current season;Location of own room;Staff names and faces;That he or she is in a hospital/hospital unit   Refer to Care Plan for Long Term Goals  SHORT TERM GOAL WEEK 1 OT Short Term Goal 1 (Week 1): Patient will direct position of left arm for comfort during self care activity OT Short Term Goal 2 (Week 1): Patient will utilize left hand to wash thighs, right forearm with min assist OT Short Term Goal 3 (Week 1): Patient will  complete toilet trasnfer with close supervision OT Short Term Goal 4 (Week 1): Patient will complete toileting with mod assist  Recommendations for other services: Neuropsych   Skilled Therapeutic Intervention:  Patient received supine in bed, partner at bedside.  Patient with hot packs on shoulder still reporting significant pain.  Patient has had pain medication so used time to gather information rather than requesting initial movement to give medicine time to work.  Patient reports difficult time at prior hospital, and wants to ensure she will be allowed to be clean, dry, and pain managed.  Assured patient that team would work to time pain medication with therapy, and all goals would be directed at her being able to care for herself as much as possible.  Patient pleasant and working through pain throughout session.  Agreeable to shower - walked to shower with min assist - patient able to walk but not stable to walk unattended.  Patient sat to shower, and reported warm water helped shoulder feel better.   Removed sling for shower and supported arm with towel roll, although patient able to use left hand to assist slightly with bathing.  Patient returned to bed with partner at bedside assisting her with application of lotion.   ADL ADL Eating: Moderate assistance Where Assessed-Eating: Bed level Grooming: Moderate assistance Where Assessed-Grooming: Bed level Upper Body Bathing: Moderate assistance Where Assessed-Upper Body Bathing: Shower Lower  Body Bathing: Minimal assistance Where Assessed-Lower Body Bathing: Shower Upper Body Dressing: Maximal assistance Where Assessed-Upper Body Dressing: Edge of bed Lower Body Dressing: Moderate assistance Where Assessed-Lower Body Dressing: Edge of bed Toileting: Unable to assess Toilet Transfer: Minimal assistance Toilet Transfer Method: Ambulating Tub/Shower Transfer: Unable to assess Tub/Shower Transfer Method: Unable to assess Film/video editor: Minimal assistance Film/video editor Method: Designer, industrial/product: Transfer tub bench Mobility  Bed Mobility Bed Mobility: Supine to Sit;Sit to Supine Supine to Sit: Minimal Assistance - Patient > 75% Sit to Supine: Minimal Assistance - Patient > 75% Transfers Sit to Stand: Minimal Assistance - Patient > 75% Stand to Sit: Minimal Assistance - Patient > 75%   Discharge Criteria: Patient will be discharged from OT if patient refuses treatment 3 consecutive times without medical reason, if treatment goals not met, if there is a change in medical status, if patient makes no progress towards goals or if patient is discharged from hospital.  The above assessment, treatment plan, treatment alternatives and goals were discussed and mutually agreed upon: by patient  Fransico Allean HERO 03/20/2024, 4:26 PM

## 2024-03-20 NOTE — Evaluation (Signed)
 Physical Therapy Assessment and Plan  Patient Details  Name: Eileen Chaney MRN: [id number] Date of Birth: Apr 17, 1976  PT Diagnosis: Ataxic gait, Coordination disorder, Impaired sensation, Muscle weakness, and Pain in joint Rehab Potential:   ELOS: 12 to 14 days   Today's Date: 03/20/2024 PT Individual Time: 1045 - 1145  60 minutes   Hospital Problem: Principal Problem:   Cervical myelopathy (HCC)   Past Medical History:  Past Medical History:  Diagnosis Date   Anxiety    Arthritis    Asthma    Cervical myelopathy with cervical radiculopathy (HCC)    Complication of anesthesia    with c sections had a had time getting her sedated   DDD (degenerative disc disease), cervical    Depression    Diabetes mellitus without complication (HCC)    type 2   Family history of adverse reaction to anesthesia    mother hard to wake up   Fibromyalgia    Hyperlipidemia    Hypertension    Idiopathic urticaria    Pneumonia    PONV (postoperative nausea and vomiting)    Undifferentiated inflammatory arthritis    Past Surgical History:  Past Surgical History:  Procedure Laterality Date   ABDOMINAL HYSTERECTOMY  2018   uterine fibroids   ANKLE SURGERY Right 2022   ligament repair   ANTERIOR CERVICAL DECOMP/DISCECTOMY FUSION N/A 11/12/2021   Procedure: C3-5 ANTERIOR CERVICAL DISCECTOMY AND FUSION (GLOBUS HEDRON);  Surgeon: Clois Fret, MD;  Location: [facility];  Service: Neurosurgery;  Laterality: N/A;   CERVICAL WOUND DEBRIDEMENT N/A 01/20/2022   Procedure: posterior wound debridment;  Surgeon: Clois Fret, MD;  Location: [facility];  Service: Neurosurgery;  Laterality: N/A;   CESAREAN SECTION     X3 2000, 2001, 2009   DILATION AND CURETTAGE OF UTERUS     POSTERIOR CERVICAL FUSION/FORAMINOTOMY N/A 03/15/2024   Procedure: POSTERIOR CERVICAL FUSION/FORAMINOTOMY LEVEL 4;  Surgeon: Clois Fret, MD;  Location: [facility];  Service: Neurosurgery;  Laterality: N/A;  C4-7  Posterior Spinal Decompression C4-7 Posterior Spinal Instrumentation C5-7 Fusion   POSTERIOR CERVICAL LAMINECTOMY N/A 01/07/2022   Procedure: OPEN C5-7 POSTERIOR DECOMPRESSION;  Surgeon: Clois Fret, MD;  Location: [facility];  Service: Neurosurgery;  Laterality: N/A;   TONSILLECTOMY  2000   WISDOM TOOTH EXTRACTION      Assessment & Plan Clinical Impression: Patient is a 48 year old right-handed female with history significant for anxiety/depression as well as a component of ADHD maintained on Ritalin , asthma, class I obesity with BMI 35.28, diabetes mellitus, fibromyalgia, hyperlipidemia, hypertension, posterior cervical laminectomy 01/07/2022, anterior cervical decompression/discectomy fusion 11/12/2021 per Dr. Fret Nine complicated by cervical wound debridement 01/20/2022. Per chart review patient lives with her daughter. She has good support from her partner daughter and son. Two-level home bed and bath on main level. Independent community ambulator. Presented to [facility] 03/15/2024 with increasing difficulty with dexterity in her hands as well as poor balance. She was having trouble using her phone as well as difficulty with buttons and clasping her necklaces .[name] She continued to have ongoing neck and left arm and shoulder pain with numbness as well as low back pain that extends into her buttocks.. She has denied any recent falls. 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 Most recent preoperative labs showed a hemoglobin of 12.8 and hematocrit 40.3 on 03/03/2024 and unremarkable chemistries. Underwent posterior segmental instrumentation C3-7 as well as posterior lateral arthrodesis from C5-7 with cervical laminectomy from C4-C6 for  decompression of the spinal cord with bilateral foraminotomies and facetectomies at C3/4, C4-5, C5-6, and left foraminotomies C6-7 03/15/2024 per Dr. Reeves Nine. Placed in a cervical brace for comfort. She  did complete a course of Decadron  taper. Pain control with the use of Dilaudid  2 mg every 4 hours as needed as well as Valium  for spasms. Lovenox  added for DVT prophylaxis 03/16/2024. Follow-up imaging CT/MRI cervical spine 03/16/2024 due to complaints of increasing neck pain and acute left arm weakness showed noted degenerative changes moderate to severe spinal canal stenosis C3-4 with mild cord compression without definitive cord signal abnormality. Multilevel foraminal stenosis greatest and severe bilaterally at C3-4 and C5-6 on the left at C6-7. Moderate to severe spinal canal stenosis on the right at C6-7. 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 Therapy evaluations completed due to patient's decreased functional mobility was admitted for a comprehensive rehab program.    Patient transferred to CIR on 03/19/2024 .   Patient currently requires min with mobility secondary to muscle weakness, decreased cardiorespiratoy endurance, and ataxia and decreased coordination.  Prior to hospitalization, patient was independent  with mobility and lived with Significant other in a House home.  Home access is 3 stairsStairs to enter.  Patient will benefit from skilled PT intervention to maximize safe functional mobility, minimize fall risk, and decrease caregiver burden for planned discharge home with 24 hour supervision.  Anticipate patient will benefit from follow up HH at discharge.  PT - End of Session Activity Tolerance: Tolerates 30+ min activity with multiple rests Endurance Deficit: Yes PT Assessment PT Barriers to Discharge: Decreased caregiver support PT Barriers to Discharge Comments: steps to enter with L rail PT Patient demonstrates impairments in the following area(s): Balance;Endurance;Safety;Pain;Sensory PT Transfers Functional  Problem(s): Bed Mobility;Bed to Chair;Car PT Locomotion Functional Problem(s): Ambulation;Stairs PT Plan PT Intensity: Minimum of 1-2 x/day ,45 to 90 minutes PT Frequency: 5 out of 7 days PT Duration Estimated Length of Stay: 12 to 14 days PT Treatment/Interventions: Ambulation/gait training;Balance/vestibular training;Community reintegration;DME/adaptive equipment instruction;Discharge planning;Functional mobility training;Neuromuscular re-education;Patient/family education;Pain management;Stair training;Therapeutic Activities;Therapeutic Exercise;UE/LE Strength taining/ROM;UE/LE Coordination activities PT Transfers Anticipated Outcome(s): S transfers PT Locomotion Anticipated Outcome(s): S gait, contact gaurd stairs PT Recommendation Follow Up Recommendations: Home health PT;Outpatient PT Patient destination: Home Equipment Recommended: To be determined   PT Evaluation Precautions/Restrictions Precautions Precautions: Cervical;Fall Required Braces or Orthoses: Cervical Brace Cervical Brace: For comfort Restrictions Weight Bearing Restrictions Per Provider Order: No General Chart Reviewed: Yes PT Amount of Missed Time (min): 15 Minutes PT Missed Treatment Reason: Other (Comment);Pain (B lower extremity doppler) Family/Caregiver Present: No  Pain Pain Assessment Pain Scale: 0-10 Pain Score: 4  Pain Location: Shoulder Pain Orientation: Left Pain Intervention(s): Medication (See eMAR) Pain Interference Pain Interference Pain Effect on Sleep: 4. Almost constantly Pain Interference with Therapy Activities: 4. Almost constantly Pain Interference with Day-to-Day Activities: 4. Almost constantly Home Living/Prior Functioning Home Living Available Help at Discharge: Family;Available PRN/intermittently Type of Home: House Home Access: Stairs to enter Entrance Stairs-Number of Steps: 3 stairs Entrance Stairs-Rails: Left Home Layout: Two level;Able to live on main level with  bedroom/bathroom Alternate Level Stairs-Number of Steps: 15 Alternate Level Stairs-Rails: Can reach both  Lives With: Significant other Prior Function Level of Independence: Independent with basic ADLs;Independent with gait  Able to Take Stairs?: Yes Driving: Yes Vocation: Full time employment Vision/Perception  Perception Perception: Within  Functional Limits  Cognition Overall Cognitive Status: Within Functional Limits for tasks assessed Arousal/Alertness: Awake/alert Orientation Level: Oriented X4 Year: 2026 Month: June Day of Week: Correct Memory: Appears intact Awareness: Appears intact Problem Solving: Appears intact Safety/Judgment: Appears intact Sensation Sensation Light Touch: Impaired Detail Light Touch Impaired Details: Impaired RLE;Impaired LLE Proprioception: Impaired by gross assessment Coordination Gross Motor Movements are Fluid and Coordinated: No Motor  Motor Motor: Ataxia Motor - Skilled Clinical Observations: decrease strength lower extremities  Mobility Bed Mobility Bed Mobility: Supine to Sit;Sit to Supine Supine to Sit: Minimal Assistance - Patient > 75% Sit to Supine: Minimal Assistance - Patient > 75% Transfers Transfers: Sit to Stand;Stand to Sit;Stand Pivot Transfers Sit to Stand: Minimal Assistance - Patient > 75% Stand to Sit: Minimal Assistance - Patient > 75% Stand Pivot Transfers: Minimal Assistance - Patient > 75% Locomotion Gait Ambulation: Yes Gait Assistance: Minimal Assistance - Patient > 75% Gait Distance (Feet): 150 Feet Assistive device: None Gait Gait: Yes Gait velocity: decreased Stairs / Additional Locomotion Stairs: No (to be assessed) Wheelchair Mobility Wheelchair Mobility: No Trunk/Postural Assessment  Cervical Assessment Cervical Assessment: Exceptions to [facility] Thoracic Assessment Thoracic Assessment: Within Functional Limits Lumbar Assessment Lumbar Assessment: Within Functional Limits Postural  Control Postural Control: Deficits on evaluation  Balance Balance Balance Assessed: Yes Static Sitting Balance Static Sitting - Balance Support: Feet supported;Right upper extremity supported Static Sitting - Level of Assistance: 5: Stand by assistance Static Standing Balance Static Standing - Balance Support: During functional activity Static Standing - Level of Assistance: 4: Min assist;5: Stand by assistance Dynamic Standing Balance Dynamic Standing - Balance Support: During functional activity Dynamic Standing - Level of Assistance: 4: Min assist Extremity Assessment  RUE Assessment RUE Assessment: Exceptions to [facility] General Strength Comments: (P) 54lb grip strength LUE Assessment LUE Assessment: Exceptions to [facility] General Strength Comments: 25lb grip strength RLE Assessment RLE Assessment: Exceptions to [facility] Active Range of Motion (AROM) Comments: WFLs General Strength Comments: grossly 3 to 3+/5 LLE Assessment LLE Assessment: Exceptions to [facility] Active Range of Motion (AROM) Comments: WFLs General Strength Comments: grossly 3/5 to 3+/5  Care Tool Care Tool Bed Mobility Roll left and right activity   Roll left and right assist level: Minimal Assistance - Patient > 75%    Sit to lying activity   Sit to lying assist level: Minimal Assistance - Patient > 75%    Lying to sitting on side of bed activity   Lying to sitting on side of bed assist level: the ability to move from lying on the back to sitting on the side of the bed with no back support.: Minimal Assistance - Patient > 75%     Care Tool Transfers Sit to stand transfer   Sit to stand assist level: Minimal Assistance - Patient > 75%    Chair/bed transfer   Chair/bed transfer assist level: Minimal Assistance - Patient > 75%    Car transfer   Car transfer assist level: Minimal Assistance - Patient > 75%      Care Tool Locomotion Ambulation   Assist level: Moderate Assistance - Patient 50 - 74%   Max distance:  150  Walk 10 feet activity   Assist level: Minimal Assistance - Patient > 75% Assistive device: Walker-rolling   Walk 50 feet with 2 turns activity   Assist level: Minimal Assistance - Patient > 75% Assistive device: Walker-rolling  Walk 150 feet activity   Assist level: Minimal Assistance - Patient > 75% Assistive device: Walker-rolling  Walk 10 feet on uneven surfaces activity Walk 10 feet on uneven surfaces activity did not occur: Safety/medical concerns      Stairs Stair activity did not occur: Safety/medical concerns        Walk up/down 1 step activity Walk up/down 1 step or curb (drop down) activity did not occur: Safety/medical concerns      Walk up/down 4 steps activity Walk up/down 4 steps activity did not occur: Safety/medical concerns      Walk up/down 12 steps activity Walk up/down 12 steps activity did not occur: Safety/medical concerns      Pick up small objects from floor Pick up small object from the floor (from standing position) activity did not occur: Safety/medical concerns      Wheelchair Is the patient using a wheelchair?: No          Wheel 50 feet with 2 turns activity      Wheel 150 feet activity        Refer to Care Plan for Long Term Goals  SHORT TERM GOAL WEEK 1 PT Short Term Goal 1 (Week 1): Pt will increase bed mobility to contact gaurd. PT Short Term Goal 2 (Week 1): Pt will increase transfers to contact gaurd. PT Short Term Goal 3 (Week 1): Pt will ambulate 159 feet with LRAD and contact guard PT Short Term Goal 4 (Week 1): Pt will ascend/descend 4 stairs with 1 rail and min A. PT Short Term Goal 5 (Week 1): Pt will increase standing balance to contact gaurd  Recommendations for other services: None   Skilled Therapeutic Intervention PT evaluation completed and treatment plan initiated. Pt performed multiple transfers with min A and verbal cues. Pt ambulated 125 and 50 feet with min A and verbal cues, multiple losses of balance and  ataxic gait. Pt required multiple rest breaks due to pain. Pt complained of increasing throughout treatment requiring rest breaks. Pt was medicated prior to treatment. Following treatment pt returned to room. Pt transferred edge of bed to supine with min A and verbal cues. Therapist placed K-pad under L shoulder, pt stated decreased c/o pain and increased comfort with K-pad in place. Pt left sitting up in bed with all needs within reach and bed alarm on.   Discharge Criteria: Patient will be discharged from PT if patient refuses treatment 3 consecutive times without medical reason, if treatment goals not met, if there is a change in medical status, if patient makes no progress towards goals or if patient is discharged from hospital.  The above assessment, treatment plan, treatment alternatives and goals were discussed and mutually agreed upon: by patient  Merilee Lynwood MATSU 03/20/2024, 4:07 PM

## 2024-03-20 NOTE — Progress Notes (Signed)
 Bilateral lower extremity venous duplex has been completed. Preliminary results can be found in CV Proc through chart review.   03/20/24 11:07 AM Cathlyn Collet RVT

## 2024-03-21 MED ORDER — DOCUSATE SODIUM 100 MG PO CAPS
200.0000 mg | ORAL_CAPSULE | Freq: Two times a day (BID) | ORAL | Status: DC
  Administered 2024-03-21 – 2024-04-01 (×19): 200 mg via ORAL
  Filled 2024-03-21 (×22): qty 2

## 2024-03-21 MED ORDER — PSYLLIUM 95 % PO PACK
1.0000 | PACK | Freq: Two times a day (BID) | ORAL | Status: DC
  Administered 2024-03-21 – 2024-03-31 (×21): 1 via ORAL
  Filled 2024-03-21 (×23): qty 1

## 2024-03-21 NOTE — Plan of Care (Signed)
  Problem: Consults Goal: RH GENERAL PATIENT EDUCATION Description: See Patient Education module for education specifics. Outcome: Progressing Goal: Skin Care Protocol Initiated - if Braden Score 18 or less Description: If consults are not indicated, leave blank or document N/A Outcome: Progressing   Problem: RH BOWEL ELIMINATION Goal: RH STG MANAGE BOWEL WITH ASSISTANCE Description: STG Manage Bowel with Assistance. Outcome: Progressing Goal: RH STG MANAGE BOWEL W/MEDICATION W/ASSISTANCE Description: STG Manage Bowel with Medication with Assistance. Outcome: Progressing   Problem: RH BLADDER ELIMINATION Goal: RH STG MANAGE BLADDER WITH ASSISTANCE Description: STG Manage Bladder With Assistance Outcome: Progressing Goal: RH STG MANAGE BLADDER WITH EQUIPMENT WITH ASSISTANCE Description: STG Manage Bladder With Equipment With Assistance Outcome: Progressing   Problem: RH PAIN MANAGEMENT Goal: RH STG PAIN MANAGED AT OR BELOW PT'S PAIN GOAL Outcome: Progressing   Problem: RH KNOWLEDGE DEFICIT GENERAL Goal: RH STG INCREASE KNOWLEDGE OF SELF CARE AFTER HOSPITALIZATION Outcome: Progressing

## 2024-03-21 NOTE — Progress Notes (Signed)
 Physical Therapy Session Note  Patient Details  Name: Eileen Chaney MRN: [id number] Date of Birth: Dec 06, 1975  Today's Date: 03/21/2024 PT Individual Time: 0830-0940 PT Individual Time Calculation (min): 70 min   Short Term Goals: Week 1:  PT Short Term Goal 1 (Week 1): Pt will increase bed mobility to contact gaurd. PT Short Term Goal 2 (Week 1): Pt will increase transfers to contact gaurd. PT Short Term Goal 3 (Week 1): Pt will ambulate 159 feet with LRAD and contact guard PT Short Term Goal 4 (Week 1): Pt will ascend/descend 4 stairs with 1 rail and min A. PT Short Term Goal 5 (Week 1): Pt will increase standing balance to contact gaurd  Skilled Therapeutic Interventions/Progress Updates:    Chart reviewed and pt agreeable to therapy. Pt received seated in WC with 4/10 c/o pain in L shoulder and neck. Also of note, pt sitting with cervical collar donned. Session focused on functional transfers and amb to promote independent home mobility. Pt initiated session with attempted to stand at sink for self-care. Pt noted to have very high pn in L shoulder during attempt to stand. Pt encouraged to practice self-care in sitting with back away from back rest, with increasing tolerance before completing self-care in standing. Pt educated on using neutral neck positioning turning sit to stand to reduce pn. Pt then practiced amb in hallway for distances of 100-182ft using CGA + no AD. Pt noted to display mild ataxia that increased with increase noise/business in hallways environment. Pt also periodically closing eyes during amb 2/2 high pn. Pt then amb to toilet with same assist level for doffing pants. Pt able to stand from toilet with CGA and amb to WC. Session education emphasized cervical precautions and attention to energy conservation and environment for safe amb. At end of session, pt was left seated in [facility] with alarm engaged, nurse call bell and all needs in reach.     Therapy  Documentation Precautions:  Precautions Precautions: Cervical, Fall Required Braces or Orthoses: Cervical Brace Cervical Brace: For comfort Restrictions Weight Bearing Restrictions Per Provider Order: No General:       Therapy/Group: Individual Therapy  Mija Effertz G Andera Cranmer, PT, DPT 03/21/2024, 12:21 PM

## 2024-03-21 NOTE — Progress Notes (Addendum)
 PROGRESS NOTE   Subjective/Complaints:  Pt doing better today! Slept better, pain better in general.  LBM 2-3 days ago, says at home she uses benefiber so she's agreeable to psyllium, ok with increasing colace to max dose.  Urinating fine. No other complaints or concerns.   ROS: as per HPI. Denies CP, SOB, abd pain, N/V/D, or any other complaints at this time.    Objective:   VAS US  LOWER EXTREMITY VENOUS (DVT) Result Date: 03/21/2024  Lower Venous DVT Study Patient Name:  Eileen Chaney  Date of Exam:   03/20/2024 Medical Rec #: [id number]          Accession #:    [id number] Date of Birth: [birth year]          Patient Gender: F Patient Age:   48 years Exam Location:  [facility] ([facility] Procedure:      VAS US  LOWER EXTREMITY VENOUS (DVT) Referring Phys: TORIBIO PITCH --------------------------------------------------------------------------------  Indications: Swelling.  Risk Factors: None identified. Comparison Study: No prior studies. Performing Technologist: Cordella Collet RVT  Examination Guidelines: A complete evaluation includes B-mode imaging, spectral Doppler, color Doppler, and power Doppler as needed of all accessible portions of each vessel. Bilateral testing is considered an integral part of a complete examination. Limited examinations for reoccurring indications may be performed as noted. The reflux portion of the exam is performed with the patient in reverse Trendelenburg.  +---------+---------------+---------+-----------+----------+--------------+ RIGHT    CompressibilityPhasicitySpontaneityPropertiesThrombus Aging +---------+---------------+---------+-----------+----------+--------------+ CFV      Full           Yes      Yes                                 +---------+---------------+---------+-----------+----------+--------------+ SFJ      Full                                                         +---------+---------------+---------+-----------+----------+--------------+ FV Prox  Full                                                        +---------+---------------+---------+-----------+----------+--------------+ FV Mid   Full                                                        +---------+---------------+---------+-----------+----------+--------------+ FV DistalFull                                                        +---------+---------------+---------+-----------+----------+--------------+  PFV      Full                                                        +---------+---------------+---------+-----------+----------+--------------+ POP      Full           Yes      Yes                                 +---------+---------------+---------+-----------+----------+--------------+ PTV      Full                                                        +---------+---------------+---------+-----------+----------+--------------+ PERO     Full                                                        +---------+---------------+---------+-----------+----------+--------------+   +---------+---------------+---------+-----------+----------+--------------+ LEFT     CompressibilityPhasicitySpontaneityPropertiesThrombus Aging +---------+---------------+---------+-----------+----------+--------------+ CFV      Full           Yes      Yes                                 +---------+---------------+---------+-----------+----------+--------------+ SFJ      Full                                                        +---------+---------------+---------+-----------+----------+--------------+ FV Prox  Full                                                        +---------+---------------+---------+-----------+----------+--------------+ FV Mid   Full                                                         +---------+---------------+---------+-----------+----------+--------------+ FV DistalFull                                                        +---------+---------------+---------+-----------+----------+--------------+ PFV      Full                                                        +---------+---------------+---------+-----------+----------+--------------+  POP      Full           Yes      Yes                                 +---------+---------------+---------+-----------+----------+--------------+ PTV      Full                                                        +---------+---------------+---------+-----------+----------+--------------+ PERO     Full                                                        +---------+---------------+---------+-----------+----------+--------------+     Summary: RIGHT: - There is no evidence of deep vein thrombosis in the lower extremity.  - No cystic structure found in the popliteal fossa.  LEFT: - There is no evidence of deep vein thrombosis in the lower extremity.  - No cystic structure found in the popliteal fossa.  *See table(s) above for measurements and observations. Electronically signed by Penne Colorado MD on 03/21/2024 at 10:29:57 AM.    Final    Recent Labs    03/19/24 1246  WBC 12.3*  HGB 10.1*  HCT 30.9*  PLT 308   Recent Labs    03/19/24 1246  CREATININE 0.74       Intake/Output Summary (Last 24 hours) at 03/21/2024 1030 Last data filed at 03/21/2024 0914 Gross per 24 hour  Intake 720 ml  Output --  Net 720 ml        Physical Exam: Vital Signs Blood pressure 105/67, pulse 99, temperature 98.1 F (36.7 C), resp. rate 18, height 5' 8 (1.727 m), SpO2 99%.  Constitutional: No apparent distress. Appropriate appearance for age. Up in w/c with OT. HENT: No JVD. Soft collar donned.  Eyes: PERRLA. EOMI. Visual fields grossly intact. + glasses Cardiovascular: RRR, no murmurs/rub/gallops. No Edema.  Peripheral pulses 2+  Respiratory: CTAB. No rales, rhonchi, or wheezing. On RA.  Abdomen: + bowel sounds, normoactive. No distention or tenderness.  Skin: C/D/I. No apparent lesions. Neuro: A&O x4   PRIOR EXAMS: MSK:      No apparent deformity. + LUE sling for comfort      + TTP L posterior shoulder - no palpable deformity   Neurologic exam:  Cognition: AAO to person, place, time and event.  Language: Fluent, No substitutions or neoglisms. No dysarthria. Names 3/3 objects correctly.  Memory: Recalls 3/3 objects at 5 minutes. No apparent deficits  Insight: Good  insight into current condition.  Mood: Pleasant affect, appropriate mood.  Sensation: To light touch intact in BL LE; LUE diffusely reduced compared to LUE; absent digits 1-3   Reflexes: 0 BL UE, 0  L patella; 2+ R patella. Negative Hoffman's and babinski signs bilaterally.   CN: 2-12 grossly intact.  Coordination: No apparent tremors. No ataxia  Spasticity: MAS 0 in all extremities.       Strength:                RUE: 5-/5 SA, 5-/5 EF, 5-/5 EE, 5-/5 WE, 5-/5  FF, 5-/5 FA                LUE:  1/5 SA, 3/5 EF, 3/5 EE, 3/5 WE, 4/5 FF, 4/5 FA                RLE: 5/5 HF, 5/5 KE, 5/5  DF, 5/5  EHL, 5/5  PF                 LLE:  5/5 HF, 5/5 KE, 5/5  DF, 5/5  EHL, 5/5  PF   Assessment/Plan: 1. Functional deficits which require 3+ hours per day of interdisciplinary therapy in a comprehensive inpatient rehab setting. Physiatrist is providing close team supervision and 24 hour management of active medical problems listed below. Physiatrist and rehab team continue to assess barriers to discharge/monitor patient progress toward functional and medical goals  Care Tool:  Bathing    Body parts bathed by patient: Chest, Abdomen, Right upper leg, Left upper leg, Right lower leg, Left lower leg, Face, Front perineal area   Body parts bathed by helper: Right arm, Left arm, Buttocks     Bathing assist Assist Level: Moderate Assistance -  Patient 50 - 74%     Upper Body Dressing/Undressing Upper body dressing   What is the patient wearing?: Pull over shirt    Upper body assist Assist Level: Maximal Assistance - Patient 25 - 49%    Lower Body Dressing/Undressing Lower body dressing      What is the patient wearing?: Pants     Lower body assist Assist for lower body dressing: Moderate Assistance - Patient 50 - 74%     Toileting Toileting Toileting Activity did not occur (Clothing management and hygiene only): N/A (no void or bm)  Toileting assist       Transfers Chair/bed transfer  Transfers assist     Chair/bed transfer assist level: Minimal Assistance - Patient > 75%     Locomotion Ambulation   Ambulation assist      Assist level: Moderate Assistance - Patient 50 - 74%   Max distance: 150   Walk 10 feet activity   Assist     Assist level: Minimal Assistance - Patient > 75% Assistive device: Walker-rolling   Walk 50 feet activity   Assist    Assist level: Minimal Assistance - Patient > 75% Assistive device: Walker-rolling    Walk 150 feet activity   Assist    Assist level: Minimal Assistance - Patient > 75% Assistive device: Walker-rolling    Walk 10 feet on uneven surface  activity   Assist Walk 10 feet on uneven surfaces activity did not occur: Safety/medical concerns         Wheelchair     Assist Is the patient using a wheelchair?: No             Wheelchair 50 feet with 2 turns activity    Assist            Wheelchair 150 feet activity     Assist          Blood pressure 105/67, pulse 99, temperature 98.1 F (36.7 C), resp. rate 18, height 5' 8 (1.727 m), SpO2 99%.  Medical Problem List and Plan: 1. Functional deficits secondary to cervical myelopathy status post C3-7 PSF, C4-6 decompression 03/15/2024 with postoperative left upper extremity weakness as well as history of cervical laminectomy 01/07/2022 as well as 11/12/2021.   Cervical collar as directed             -  patient may shower with incisions covered              -ELOS/Goals: 10-12 days, Min A PT, OT             -Continue CIR   2.  Antithrombotics: -DVT/anticoagulation:  Pharmaceutical: Lovenox  40mg  daily, vascular study neg 6/22             -antiplatelet therapy: N/A 3. Pain Management: Lyrica  150 mg TID, Dilaudid  2 mg every 4 hours as moderate pain needed and 4 mg every 4 hours as needed severe pain, Robaxin  1000 mg PO every 6 hours, Valium  5mg  q8h as needed muscle spasms              - Patient with slight allergy to dilaudid , has benadryll 35 mg Q6H PRN  - Poorly controlled L posterior shoulder neuropathic pain - added elavil  25 mg at bedtime for pain/sleep + oxycodone  5 mg TID (no allergy prior, scheduled before therapies/bed). Did fail pain management with oxycodone  IR prior to dilaudid . -03/20/24 pain management still an issue, mostly related to doses not coming on time -changed scheduled oxy 5mg  IR to 0600/1300/2100 to have it on off-hours but related to PT schedule and sleep schedule.  -ordered Kpad -Lidoderm  patches for L shoulder -call on call provider if pain control issues-- might change oxy to SR form -03/21/24 pain much better! Discussed that eventually we need to wean some of the narcotics, but for now hopefully we have improved control.    4. Mood/Behavior/Sleep/ADHD: Ritalin  20 mg every morning, Effexor  75 mg daily, Atarax  50 mg nightly as needed sleep             -antipsychotic agents: N/A              - added elavil  25 mg at bedtime  -03/20/24 added melatonin 5mg  nightly-- home med too   5. Neuropsych/cognition: This patient is capable of making decisions on her own behalf. 6. Skin/Wound Care: Routine skin checks 7. Fluids/Electrolytes/Nutrition: Routine in and outs with follow-up chemistries, continue vitamin/supp 8.  Hypertension.  Aldactone  100 mg twice daily, minoxidil  2.5 mg daily.  Monitor with increased mobility  -6/21-22/25 BPs  fine, monitor Vitals:   03/19/24 1231 03/19/24 2006 03/20/24 0519 03/20/24 1553  BP: 116/80 99/62 116/73 111/78   03/20/24 1951 03/21/24 0630  BP: (!) 131/90 105/67    9.  Constipation.  Colace 100 mg twice daily, MiraLAX  daily as needed, senna 8.6mg  BID -03/20/24 no BM in 2 days per pt, but doesn't want to adjust meds yet-- if no BM by tomorrow, adjust further -03/21/24 still no BM, increase colace 200mg  BID, add psyllium per pt preference; monitor, if no BM tomorrow, would do sorbitol  10.  Diabetes mellitus.  Hemoglobin A1c 5.8.  Blood sugars 103-94-106-134.  Patient on Mounjaro prior to admission and resume at discharge. No need for SSI/CBG checks.  11.  Hyperlipidemia.  Lipitor 40 mg daily 12.  History of asthma.  Singulair  10 mg daily. Claritin  10mg  daily. Flonase  2 sprays nightly. Check oxygen saturations every shift 13.  Class I obesity.  BMI 35.28.  Dietary follow-up  14. Hx b/l CTS surgery; LUE weakness s/p PSF. L digit 1-3 numbness on exam, recommend OP EMG on discharge.  15. Vit D Deficiency: vit D supp 50k U weekly 16. ABL Anemia: Hgb 10.1 on admission, monitor weekly 17. Leukocytosis: likely from steroid, monitor weekly, monitor for s/sx of infection     LOS: 2 days A FACE TO FACE  EVALUATION WAS PERFORMED  [address] [place] 03/21/2024, 10:30 AM

## 2024-03-21 NOTE — Progress Notes (Signed)
 Physical Therapy Session Note  Patient Details  Name: Eileen Chaney MRN: [id number] Date of Birth: 1976/05/17  Today's Date: 03/21/2024 PT Individual Time: 1017-1111 PT Individual Time Calculation (min): 54 min   Short Term Goals: Week 1:  PT Short Term Goal 1 (Week 1): Pt will increase bed mobility to contact gaurd. PT Short Term Goal 2 (Week 1): Pt will increase transfers to contact gaurd. PT Short Term Goal 3 (Week 1): Pt will ambulate 159 feet with LRAD and contact guard PT Short Term Goal 4 (Week 1): Pt will ascend/descend 4 stairs with 1 rail and min A. PT Short Term Goal 5 (Week 1): Pt will increase standing balance to contact gaurd  Skilled Therapeutic Interventions/Progress Updates:      Pt seated in WC upon arrival. Pt agreeable to therpay. Pt reports groggyiness/lightheadedness 2/2 pain from previous therapy session. Pt reports 4/10 pain at rest, and 10/10 pain with activity in L shoulder radiating to L arm and hand with resulting numbness in L hand. Pt requesting pain medicine, notified nurse. Nurse present to donn Lidocaine  patch. Therapist donned k pad to L shoulder. Therapist adjusting recline of reclining WC for pt comfort.   Vitals assessed:   Seated in WC: BP 120/80 HR 101  Session performed at [facility] level for pain management.   Pt performed the following exercises for B LE strengthening/activity tolerance:   1x10 LAQ B with 2# ankle weight  1x10 seated heel/toe raises with 2# ankle weight  Pt required frequent seated rest breaks 2/2 pain.   Discussed cervical precautions, pt able to provide teach of back of not raising arms over her head. Therapist provided handout with images of cervical precautions. Discussed positioning in bed and in [facility] for maintenance of precautions and for pain management.   Pt seated in WC at end of session with all needs within reach and chair alarm on.          Therapy Documentation Precautions:  Precautions Precautions:  Cervical, Fall Required Braces or Orthoses: Cervical Brace Cervical Brace: For comfort Restrictions Weight Bearing Restrictions Per Provider Order: No     Therapy/Group: Individual Therapy  [facility] [place], , DPT  03/21/2024, 7:52 AM

## 2024-03-21 NOTE — Progress Notes (Signed)
 Occupational Therapy Session Note  Patient Details  Name: Eileen Chaney MRN: [id number] Date of Birth: 1976-05-04  Today's Date: 03/21/2024 OT Individual Time: 9765-9664 OT Individual Time Calculation (min): 61 min    Short Term Goals: Week 1:  OT Short Term Goal 1 (Week 1): Patient will direct position of left arm for comfort during self care activity OT Short Term Goal 2 (Week 1): Patient will utilize left hand to wash thighs, right forearm with min assist OT Short Term Goal 3 (Week 1): Patient will  complete toilet trasnfer with close supervision OT Short Term Goal 4 (Week 1): Patient will complete toileting with mod assist  Skilled Therapeutic Interventions/Progress Updates:   Patient in bed at the time of arrival with family present.  The pt indicated that she was in pain,  presenting as a  8 on a 0-10 for L shld pain with nursing made aware. The pt expressed an interest in taking a shower.  The pt was able to come from bed LOF to ambulating free of device to the restroom with closeS. The pt was able to doff her underwear and shorts for placement on the commode with CGA with her soft collar neck brace in place and her LUE sling donned. The pt was able to complete toileting hygiene with close S. The pt was able to doff her LB clothing items and was able to ambulate to the shower stall with close S incorporating the grab bar for placement as needed. The neck collar was doffed and the sling was removed, the pt was able to bathe her UB with MinA  for the R side of the body.  The pt required MinA for applying lotion and deo as well. The pt was MinA for donning her  over head shirt and she was MinA for donning her underwear and shorts.  The pt was able to ambulate to EOB with close S. The pt was able to place BLE onto the bed with closeS.  The pt was able to adjust her position for comfort with MinA.  At the end of the session, the call light and bedside table were placed within reach with all  additional needs addressed.   Therapy Documentation Precautions:  Precautions Precautions: Cervical, Fall Required Braces or Orthoses: Cervical Brace Cervical Brace: For comfort Restrictions Weight Bearing Restrictions Per Provider Order: No  Therapy/Group: Individual Therapy  Elvera JONETTA Mace 03/21/2024, 4:31 PM

## 2024-03-22 DIAGNOSIS — E119 Type 2 diabetes mellitus without complications: Secondary | ICD-10-CM

## 2024-03-22 DIAGNOSIS — D72829 Elevated white blood cell count, unspecified: Secondary | ICD-10-CM

## 2024-03-22 DIAGNOSIS — R000 Tachycardia, unspecified: Secondary | ICD-10-CM

## 2024-03-22 DIAGNOSIS — Z794 Long term (current) use of insulin: Secondary | ICD-10-CM

## 2024-03-22 DIAGNOSIS — K5900 Constipation, unspecified: Secondary | ICD-10-CM

## 2024-03-22 LAB — CBC WITH DIFFERENTIAL/PLATELET
Abs Immature Granulocytes: 0.13 10*3/uL — ABNORMAL HIGH (ref 0.00–0.07)
Basophils Absolute: 0 10*3/uL (ref 0.0–0.1)
Basophils Relative: 0 %
Eosinophils Absolute: 0.5 10*3/uL (ref 0.0–0.5)
Eosinophils Relative: 4 %
HCT: 30.4 % — ABNORMAL LOW (ref 36.0–46.0)
Hemoglobin: 9.6 g/dL — ABNORMAL LOW (ref 12.0–15.0)
Immature Granulocytes: 1 %
Lymphocytes Relative: 31 %
Lymphs Abs: 3.5 10*3/uL (ref 0.7–4.0)
MCH: 27.5 pg (ref 26.0–34.0)
MCHC: 31.6 g/dL (ref 30.0–36.0)
MCV: 87.1 fL (ref 80.0–100.0)
Monocytes Absolute: 1 10*3/uL (ref 0.1–1.0)
Monocytes Relative: 9 %
Neutro Abs: 6 10*3/uL (ref 1.7–7.7)
Neutrophils Relative %: 55 %
Platelets: 305 10*3/uL (ref 150–400)
RBC: 3.49 MIL/uL — ABNORMAL LOW (ref 3.87–5.11)
RDW: 14.6 % (ref 11.5–15.5)
WBC: 11.1 10*3/uL — ABNORMAL HIGH (ref 4.0–10.5)
nRBC: 0 % (ref 0.0–0.2)

## 2024-03-22 LAB — COMPREHENSIVE METABOLIC PANEL WITH GFR
ALT: 11 U/L (ref 0–44)
AST: 13 U/L — ABNORMAL LOW (ref 15–41)
Albumin: 2.9 g/dL — ABNORMAL LOW (ref 3.5–5.0)
Alkaline Phosphatase: 64 U/L (ref 38–126)
Anion gap: 3 — ABNORMAL LOW (ref 5–15)
BUN: 8 mg/dL (ref 6–20)
CO2: 27 mmol/L (ref 22–32)
Calcium: 8.1 mg/dL — ABNORMAL LOW (ref 8.9–10.3)
Chloride: 103 mmol/L (ref 98–111)
Creatinine, Ser: 0.77 mg/dL (ref 0.44–1.00)
GFR, Estimated: >60 mL/min (ref >60)
Glucose, Bld: 161 mg/dL — ABNORMAL HIGH (ref 70–99)
Potassium: 3.7 mmol/L (ref 3.5–5.1)
Sodium: 133 mmol/L — ABNORMAL LOW (ref 135–145)
Total Bilirubin: 0.4 mg/dL (ref 0.0–1.2)
Total Protein: 5.8 g/dL — ABNORMAL LOW (ref 6.5–8.1)

## 2024-03-22 MED ORDER — SPIRONOLACTONE 25 MG PO TABS
25.0000 mg | ORAL_TABLET | Freq: Two times a day (BID) | ORAL | Status: DC
  Administered 2024-03-22 – 2024-03-23 (×2): 25 mg via ORAL
  Filled 2024-03-22 (×2): qty 1

## 2024-03-22 MED ORDER — SENNA 8.6 MG PO TABS
2.0000 | ORAL_TABLET | Freq: Two times a day (BID) | ORAL | Status: DC
  Administered 2024-03-22 – 2024-04-01 (×16): 17.2 mg via ORAL
  Filled 2024-03-22 (×20): qty 2

## 2024-03-22 NOTE — Progress Notes (Signed)
 PROGRESS NOTE   Subjective/Complaints: Patient asking about surgical dressing when he can be changed.  She is having neck pain because Dilaudid  was held due to low blood pressure.  Patient was also advised to drink oral fluids.  ROS: Patient denies fever, new vision changes, dizziness, nausea, vomiting, diarrhea,  shortness of breath or chest pain, headache, or mood change.    Objective:   No results found. Recent Labs    03/22/24 0506  WBC 11.1*  HGB 9.6*  HCT 30.4*  PLT 305   Recent Labs    03/22/24 0506  NA 133*  K 3.7  CL 103  CO2 27  GLUCOSE 161*  BUN 8  CREATININE 0.77  CALCIUM  8.1*    Intake/Output Summary (Last 24 hours) at 03/22/2024 1319 Last data filed at 03/21/2024 1415 Gross per 24 hour  Intake 240 ml  Output --  Net 240 ml        Physical Exam: Vital Signs Blood pressure 117/75, pulse (!) 101, temperature 97.9 F (36.6 C), resp. rate 17, height 5' 8 (1.727 m), weight 108.3 kg, SpO2 100%.  Physical Exam Constitutional: No apparent distress. Appropriate appearance for age.  HENT: No JVD. + soft C collar-not currently in place Cardiovascular: RRR, no MRG Respiratory: CTAB.  Nonlabored breathing on room air Abdomen: + bowel sounds, normoactive. No distention or tenderness.  Skin: C/D/I. No apparent lesions.  Honeycomb dressing incision CDI   MSK:      No apparent deformity. + LUE sling for comfort-not wearing currently      + TTP L posterior shoulder - no palpable deformity   Neurologic exam: Alert and awake follows commands, cranial nerves II through XII grossly intact Sensation: To light touch intact in BL LE; LUE diffusely reduced compared to LUE; absent digits 1-3    Coordination: No apparent tremors. No ataxia  Spasticity: MAS 0 in all extremities.       Strength:                RUE: 5-/5 SA, 5-/5 EF, 5-/5 EE, 5-/5 WE, 5-/5 FF, 5-/5 FA                LUE:  1/5 SA, 3/5 EF, 3/5 EE,  3/5 WE, 4/5 FF, 4/5 FA                RLE: 5/5 HF, 5/5 KE, 5/5  DF, 5/5  EHL, 5/5  PF                 LLE:  5/5 HF, 5/5 KE, 5/5  DF, 5/5  EHL, 5/5  PF   Prior neuro assessment is c/w today's exam 03/22/2024.   Assessment/Plan: 1. Functional deficits which require 3+ hours per day of interdisciplinary therapy in a comprehensive inpatient rehab setting. Physiatrist is providing close team supervision and 24 hour management of active medical problems listed below. Physiatrist and rehab team continue to assess barriers to discharge/monitor patient progress toward functional and medical goals  Care Tool:  Bathing    Body parts bathed by patient: Chest, Abdomen, Right upper leg, Left upper leg, Right lower leg, Left lower leg, Face, Front perineal area  Body parts bathed by helper: Right arm, Left arm, Buttocks     Bathing assist Assist Level: Moderate Assistance - Patient 50 - 74%     Upper Body Dressing/Undressing Upper body dressing   What is the patient wearing?: Pull over shirt    Upper body assist Assist Level: Maximal Assistance - Patient 25 - 49%    Lower Body Dressing/Undressing Lower body dressing      What is the patient wearing?: Pants     Lower body assist Assist for lower body dressing: Moderate Assistance - Patient 50 - 74%     Toileting Toileting Toileting Activity did not occur (Clothing management and hygiene only): N/A (no void or bm)  Toileting assist Assist for toileting: Moderate Assistance - Patient 50 - 74%     Transfers Chair/bed transfer  Transfers assist     Chair/bed transfer assist level: Minimal Assistance - Patient > 75%     Locomotion Ambulation   Ambulation assist      Assist level: Moderate Assistance - Patient 50 - 74%   Max distance: 150   Walk 10 feet activity   Assist     Assist level: Minimal Assistance - Patient > 75% Assistive device: Walker-rolling   Walk 50 feet activity   Assist    Assist level:  Minimal Assistance - Patient > 75% Assistive device: Walker-rolling    Walk 150 feet activity   Assist    Assist level: Minimal Assistance - Patient > 75% Assistive device: Walker-rolling    Walk 10 feet on uneven surface  activity   Assist Walk 10 feet on uneven surfaces activity did not occur: Safety/medical concerns         Wheelchair     Assist Is the patient using a wheelchair?: No             Wheelchair 50 feet with 2 turns activity    Assist            Wheelchair 150 feet activity     Assist          Blood pressure 117/75, pulse (!) 101, temperature 97.9 F (36.6 C), resp. rate 17, height 5' 8 (1.727 m), weight 108.3 kg, SpO2 100%.   Medical Problem List and Plan: 1. Functional deficits secondary to cervical myelopathy status post C3-7 PSF, C4-6 decompression 03/15/2024 with postoperative left upper extremity weakness as well as history of cervical laminectomy 01/07/2022 as well as 11/12/2021.  Cervical collar as directed             -patient may shower with incisions covered              -ELOS/Goals: 10-12 days, Min A PT, OT             -Continue CIR  - Therapy reports surgical dressing got wet after shower, will remove and start dry dressing daily as it has been 7 days since her surgery   2.  Antithrombotics: -DVT/anticoagulation:  Pharmaceutical: Lovenox  check vascular study             -antiplatelet therapy: N/A 3. 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 Did fail pain management with oxycodone  IR prior  to dilaudid .   4. Mood/Behavior/Sleep/ADHD: Ritalin  20 mg every morning, Effexor  75 mg daily, Atarax  50 mg nightly as needed sleep,              -antipsychotic agents: N/A              - added elavil  25 mg at bedtime   5. Neuropsych/cognition: This patient is capable of making decisions on her own behalf. 6. Skin/Wound Care: Routine skin checks 7. Fluids/Electrolytes/Nutrition: Routine in and outs with follow-up chemistries 8.  Hypertension.  Aldactone  100 mg twice daily, minoxidil  2.5 mg daily.  Monitor with increased mobility     03/22/2024   12:00 PM 03/22/2024   11:00 AM 03/22/2024    8:56 AM  Vitals with BMI  Weight  238 lbs 13 oz   BMI  36.32   Systolic 117  115  Diastolic 75  74  Pulse 101  102  -6/23 decrease Aldactone  to 25 mg twice daily due to relative hypotension  9.  Constipation.  Colace 100 mg twice daily, MiraLAX  daily as needed  -6/23 patient got sorbitol  today monitor for response, increase Senokot to 2 tabs 10.  Diabetes mellitus.  Hemoglobin A1c 5.8.  Blood sugars 103-94-106-134.  Patient on Mounjaro prior to admission and resume at discharge. CBG (last 3)  No results for input(s): GLUCAP in the last 72 hours. Monitor glucose on BMP  11.  Hyperlipidemia.  Lipitor 40 mg daily 12.  History of asthma.  Singulair  10 mg daily.  Check oxygen saturations every shift 13.  Class I obesity.  BMI 35.28.  Dietary follow-up  14. Hx b/l CTS surgery; LUE weakness s/p PSF. L digit 1-3 numbness on exam, recommend OP EMG/ncs on discharge.   15.  Leukocytosis  - 6/23 improved to 11.1 from 12.3.  Monitor for signs of infection 16.  Mild tachycardia  - Continue to monitor trend for now, encourage oral fluids    LOS: 3 days A FACE TO FACE EVALUATION WAS PERFORMED  Murray Collier 03/22/2024, 1:20 PM

## 2024-03-22 NOTE — Progress Notes (Addendum)
 Yellow MEWS was started on night shift by night shift nurse 03/21/24. Dr. Urbano made aware.   Geni Armor, LPN

## 2024-03-22 NOTE — Progress Notes (Signed)
 Occupational Therapy Session Note  Patient Details  Name: Eileen Chaney MRN: [id number] Date of Birth: March 15, 1976  Today's Date: 03/22/2024 OT Individual Time: 8584-8474 OT Individual Time Calculation (min): 70 min    Short Term Goals: Week 1:  OT Short Term Goal 1 (Week 1): Patient will direct position of left arm for comfort during self care activity OT Short Term Goal 2 (Week 1): Patient will utilize left hand to wash thighs, right forearm with min assist OT Short Term Goal 3 (Week 1): Patient will  complete toilet trasnfer with close supervision OT Short Term Goal 4 (Week 1): Patient will complete toileting with mod assist  Skilled Therapeutic Interventions/Progress Updates:  Patient agreeable to participate in OT session. Reports 8/10 pain level. Reported to nursing who administered medication.  Patient participated in skilled OT session focusing on UE functional use. Patient completed education on donning of cervical collar. Patient completed transfer to wc. .patient requires increased assistance due to pain and instability of L arm. Therapist facilitated/assessed gentle range of motion for active and passive to determine patients abilities and proper positioning. OT educated patient on importance of positioning for pain management. Patient able to complete UE mobility within precautions with AAROM to increase strength in UE. Patient returned to room. Patient required max A for proper positioning in wc due to increased pain. OT and patient  repositioned with several attempts in order to improve patients pain levels, and maximize patients independence. Patient left in wc with alarm on all needs met, and call light in reach.   Therapy Documentation Precautions:  Precautions Precautions: Cervical, Fall Required Braces or Orthoses: Cervical Brace Cervical Brace: For comfort Restrictions Weight Bearing Restrictions Per Provider Order: No  Pain: 8/10  Therapy/Group: Individual  Therapy  D'mariea L Calahan Pak 03/22/2024, 8:31 AM

## 2024-03-22 NOTE — Progress Notes (Signed)
 Inpatient Rehabilitation  Patient information reviewed and entered into eRehab system by [facility]. Karen Kays., CCC/SLP, PPS Coordinator.  Information including medical coding, functional ability and quality indicators will be reviewed and updated through discharge.

## 2024-03-22 NOTE — Progress Notes (Signed)
 Physical Therapy Session Note  Patient Details  Name: Eileen Chaney MRN: [id number] Date of Birth: 1976/09/06  Today's Date: 03/22/2024 PT Individual Time: 0853-1008 PT Individual Time Calculation (min): 75 min   Short Term Goals: Week 1:  PT Short Term Goal 1 (Week 1): Pt will increase bed mobility to contact gaurd. PT Short Term Goal 2 (Week 1): Pt will increase transfers to contact gaurd. PT Short Term Goal 3 (Week 1): Pt will ambulate 159 feet with LRAD and contact guard PT Short Term Goal 4 (Week 1): Pt will ascend/descend 4 stairs with 1 rail and min A. PT Short Term Goal 5 (Week 1): Pt will increase standing balance to contact gaurd  Skilled Therapeutic Interventions/Progress Updates:      Pt suppine in bed upon arrival. Pt agreeable to therapy. Pt 6-9/10 pain in L UE. Nurse prsent to administer medication at start of session. Thrapist donned B LE ted hose 2/2 hypotension per nurse.   Pt requesting to trial getting OOB.   Supine to sit with CGA/min A.   Seated balance required CGA/intermittent min A 2/2 posterior LOB.   Donned L UE sling, and Cervical collar. Donned pants with total A while pt stood with min A. Pt requesting to lay back down 2/2 increase in pain.   Attempted upper limb tension testing on L UE, however unable to reproduce pt pain.   Pt performed the following exercises for B LE/UE strengthening/ROM/activity tolerance:    Ankle pumps x20 B  SLR x10 B  Heel slides x10 B  Supine hip abduction x10 B Glute sets x10 holding 5 seconds  Quad sets x10 holding 5 seconds  Passive L UE elbow flexion, extension, internal and external rotation; pt performed active elbow extension x10, and active assisted elbow flexion x10   Pt performed ambulatory transfer to toilet, and ambulated to sink, with no no AD and R HHA, Pt requied min A for donning/doffing pants. Pt stood with GCA while washing hands.   Pt in bed at end of session eating breakfast with all needs  within reach and bed alarm on.     Therapy Documentation Precautions:  Precautions Precautions: Cervical, Fall Required Braces or Orthoses: Cervical Brace Cervical Brace: For comfort Restrictions Weight Bearing Restrictions Per Provider Order: No  Therapy/Group: Individual Therapy  [facility] [place], Heath, DPT  03/22/2024, 9:09 AM

## 2024-03-22 NOTE — Progress Notes (Signed)
 Met with patient to review current situation, team conference and plan of care. Reviewed pain medications, k pad use and safety. Reviewed incision site care. Continue to follow along to provide educational needs to facilitate preparation for discharge.

## 2024-03-22 NOTE — Care Management (Signed)
 Inpatient Rehabilitation Center Individual Statement of Services  Patient Name:  Eileen Chaney  Date:  03/22/2024  Welcome to the Inpatient Rehabilitation Center.  Our goal is to provide you with an individualized program based on your diagnosis and situation, designed to meet your specific needs.  With this comprehensive rehabilitation program, you will be expected to participate in at least 3 hours of rehabilitation therapies Monday-Friday, with modified therapy programming on the weekends.  Your rehabilitation program will include the following services:  Physical Therapy (PT), Occupational Therapy (OT), 24 hour per day rehabilitation nursing, Therapeutic Recreaction (TR), Psychology, Neuropsychology, Care Coordinator, Rehabilitation Medicine, Nutrition Services, Pharmacy Services, and Other  Weekly team conferences will be held on Wednesdays to discuss your progress.  Your Inpatient Rehabilitation Care Coordinator will talk with you frequently to get your input and to update you on team discussions.  Team conferences with you and your family in attendance may also be held.  Expected length of stay: 10-14 days    Overall anticipated outcome: Supervision  Depending on your progress and recovery, your program may change. Your Inpatient Rehabilitation Care Coordinator will coordinate services and will keep you informed of any changes. Your Inpatient Rehabilitation Care Coordinator's name and contact numbers are listed  below.  The following services may also be recommended but are not provided by the Inpatient Rehabilitation Center:  Driving Evaluations Home Health Rehabiltiation Services Outpatient Rehabilitation Services Vocational Rehabilitation   Arrangements will be made to provide these services after discharge if needed.  Arrangements include referral to agencies that provide these services.  Your insurance has been verified to be:  BCBS  Your primary doctor is:  Alm Needle  Pertinent information will be shared with your doctor and your insurance company.  Inpatient Rehabilitation Care Coordinator:  Rhoda Clement, KEN [phone] or ELIGAH BASQUES  Information discussed with and copy given to patient by: Graeme DELENA Jude, 03/22/2024, 12:27 PM

## 2024-03-22 NOTE — Progress Notes (Signed)
 Occupational Therapy Session Note  Patient Details  Name: Eileen Chaney MRN: [id number] Date of Birth: [birth year]  Today's Date: 03/22/2024 OT Individual Time: 8954-8789 OT Individual Time Calculation (min): 85 min    Short Term Goals: Week 1:  OT Short Term Goal 1 (Week 1): Patient will direct position of left arm for comfort during self care activity OT Short Term Goal 2 (Week 1): Patient will utilize left hand to wash thighs, right forearm with min assist OT Short Term Goal 3 (Week 1): Patient will  complete toilet trasnfer with close supervision OT Short Term Goal 4 (Week 1): Patient will complete toileting with mod assist  Skilled Therapeutic Interventions/Progress Updates:      Therapy Documentation Precautions:  Precautions Precautions: Cervical, Fall Required Braces or Orthoses: Cervical Brace Cervical Brace: For comfort Restrictions Weight Bearing Restrictions Per Provider Order: No General:  Pt supine in bed upon OT arrival, agreeable to OT session.  Pain:  unrated although increased  pain reported in Lt shouder, activity, intermittent rest breaks, distractions provided for pain management, pt reports tolerable to proceed.   ADL: OT providing skilled intervention on ADL retraining in order to increase independence with tasks and increase activity tolerance. Pt completed the following tasks at the current level of assist: Bed mobility: SBA supine><EOB with HOB raised Grooming/oral hygiene: seated EOB with applying lotion, required assistance for managing lotion on back UB dressing: Min A, assistance for managing shirt overhead  LB dressing: Min A for managing pants when twisted  Footwear: total A to doff TEDs seated EOB d/t pain Shower transfer: close supervision, requiring intermittent CGA for slight mis-stepping occasionally with no AD  Bathing: Min A, assistance for washing hair and back, able to stand in shower for peri care Transfers: CGA for reaching out of  BOS during ambulation to gather clothes   Other Treatments: OT applied KT tape to Lt shoulder  in order to provide stability and decrease pain with use. Pt educated on risks and benefits of tape.   Pt supine in bed with bed alarm activated, 2 bed rails up, call light within reach and 4Ps assessed.   Therapy/Group: Individual Therapy  Camie Hoe, OTD, OTR/L 03/22/2024, 12:55 PM

## 2024-03-22 NOTE — Plan of Care (Signed)
  Problem: Consults Goal: RH GENERAL PATIENT EDUCATION Description: See Patient Education module for education specifics. Outcome: Progressing Goal: Skin Care Protocol Initiated - if Braden Score 18 or less Description: If consults are not indicated, leave blank or document N/A Outcome: Progressing   Problem: RH BOWEL ELIMINATION Goal: RH STG MANAGE BOWEL WITH ASSISTANCE Description: STG Manage Bowel with Assistance. Outcome: Progressing Goal: RH STG MANAGE BOWEL W/MEDICATION W/ASSISTANCE Description: STG Manage Bowel with Medication with Assistance. Outcome: Progressing   Problem: RH BLADDER ELIMINATION Goal: RH STG MANAGE BLADDER WITH ASSISTANCE Description: STG Manage Bladder With Assistance Outcome: Progressing Goal: RH STG MANAGE BLADDER WITH EQUIPMENT WITH ASSISTANCE Description: STG Manage Bladder With Equipment With Assistance Outcome: Progressing   Problem: RH PAIN MANAGEMENT Goal: RH STG PAIN MANAGED AT OR BELOW PT'S PAIN GOAL Outcome: Progressing   Problem: RH KNOWLEDGE DEFICIT GENERAL Goal: RH STG INCREASE KNOWLEDGE OF SELF CARE AFTER HOSPITALIZATION Outcome: Progressing

## 2024-03-22 NOTE — IPOC Note (Signed)
 Overall Plan of Care [facility]) Patient Details Name: Eileen Chaney MRN: [id number] DOB: March 17, 1976  Admitting Diagnosis: Cervical myelopathy [facility])  Hospital Problems: Principal Problem:   Cervical myelopathy (HCC)     Functional Problem List: Nursing Bladder, Bowel, Edema, Endurance, Medication Management, Safety, Pain  PT Balance, Endurance, Safety, Pain, Sensory  OT Balance, Pain, Edema, Safety, Endurance, Sensory, Motor  SLP    TR         Basic ADL's: OT Eating, Grooming, Bathing, Dressing, Toileting     Advanced  ADL's: OT       Transfers: PT Bed Mobility, Bed to Chair, Car  OT Toilet, Tub/Shower     Locomotion: PT Ambulation, Stairs     Additional Impairments: OT Fuctional Use of Upper Extremity  SLP        TR      Anticipated Outcomes Item Anticipated Outcome  Self Feeding Mod I  Swallowing      Basic self-care  Min assist  Toileting  Min assist   Bathroom Transfers Supervision  Bowel/Bladder  manage bowel with medications/ manage bladder with time toileting  Transfers  S transfers  Locomotion  S gait, contact gaurd stairs  Communication     Cognition     Pain  <4 w/ prns  Safety/Judgment  manage safety with minimal assistance   Therapy Plan: PT Intensity: Minimum of 1-2 x/day ,45 to 90 minutes PT Frequency: 5 out of 7 days PT Duration Estimated Length of Stay: 12 to 14 days OT Intensity: Minimum of 1-2 x/day, 45 to 90 minutes OT Frequency: 5 out of 7 days OT Duration/Estimated Length of Stay: 12-14 days     Team Interventions: Nursing Interventions Patient/Family Education, Skin Care/Wound Management, Bladder Management, Bowel Management, Disease Management/Prevention, Pain Management, Discharge Planning  PT interventions Ambulation/gait training, Balance/vestibular training, Community reintegration, DME/adaptive equipment instruction, Discharge planning, Functional mobility training, Neuromuscular re-education, Patient/family  education, Pain management, Stair training, Therapeutic Activities, Therapeutic Exercise, UE/LE Strength taining/ROM, UE/LE Coordination activities  OT Interventions Balance/vestibular training, Disease mangement/prevention, Functional electrical stimulation, Neuromuscular re-education, Patient/family education, Self Care/advanced ADL retraining, Splinting/orthotics, Therapeutic Exercise, UE/LE Coordination activities, Visual/perceptual remediation/compensation, UE/LE Strength taining/ROM, Therapeutic Activities, Pain management, Functional mobility training, DME/adaptive equipment instruction, Discharge planning  SLP Interventions    TR Interventions    SW/CM Interventions Discharge Planning, Psychosocial Support, Patient/Family Education   Barriers to Discharge MD  Medical stability  Nursing Decreased caregiver support, Home environment access/layout Discharge: House  Discharge Home Layout: Able to live on main level with bedroom/bathroom  Discharge Home Access: Stairs to enter  Entrance Stairs-Rails: None  Entrance Stairs-Number of Steps: 3  PT Decreased caregiver support steps to enter with L rail  OT      SLP      SW Insurance for SNF coverage     Team Discharge Planning: Destination: PT-Home ,OT- Home , SLP-  Projected Follow-up: PT-Home health PT, Outpatient PT, OT-  Home health OT, Outpatient OT, SLP-  Projected Equipment Needs: PT-To be determined, OT- To be determined, SLP-  Equipment Details: PT- , OT-  Patient/family involved in discharge planning: PT- Patient,  OT-Patient, Family member/caregiver, SLP-   MD ELOS: 12-14 Medical Rehab Prognosis:  Excellent Assessment: The patient has been admitted for CIR therapies with the diagnosis of cervical myelopathy status post C3-7 PSF, C4-6 decompression 03/15/2024 . The team will be addressing functional mobility, strength, stamina, balance, safety, adaptive techniques and equipment, self-care, bowel and bladder mgt, patient and  caregiver education. Goals have been set  at min A/Sup. Anticipated discharge destination is home.        See Team Conference Notes for weekly updates to the plan of care

## 2024-03-22 NOTE — H&P (Deleted)
 PROGRESS NOTE   Subjective/Complaints: Patient asking about surgical dressing when he can be changed.  She is having neck pain because Dilaudid  was held due to low blood pressure.  Patient was also advised to drink oral fluids.  ROS: Patient denies fever, new vision changes, dizziness, nausea, vomiting, diarrhea,  shortness of breath or chest pain, headache, or mood change.    Objective:   No results found. Recent Labs    03/22/24 0506  WBC 11.1*  HGB 9.6*  HCT 30.4*  PLT 305   Recent Labs    03/22/24 0506  NA 133*  K 3.7  CL 103  CO2 27  GLUCOSE 161*  BUN 8  CREATININE 0.77  CALCIUM  8.1*    Intake/Output Summary (Last 24 hours) at 03/22/2024 1307 Last data filed at 03/21/2024 1415 Gross per 24 hour  Intake 240 ml  Output --  Net 240 ml        Physical Exam: Vital Signs Blood pressure 117/75, pulse (!) 101, temperature 97.9 F (36.6 C), resp. rate 17, height 5' 8 (1.727 m), weight 108.3 kg, SpO2 100%.  Physical Exam Constitutional: No apparent distress. Appropriate appearance for age.  HENT: No JVD. + soft C collar-not currently in place Cardiovascular: RRR, no MRG Respiratory: CTAB.  Nonlabored breathing on room air Abdomen: + bowel sounds, normoactive. No distention or tenderness.  Skin: C/D/I. No apparent lesions.  Honeycomb dressing incision CDI   MSK:      No apparent deformity. + LUE sling for comfort-not wearing currently      + TTP L posterior shoulder - no palpable deformity   Neurologic exam: Alert and awake follows commands, cranial nerves II through XII grossly intact Sensation: To light touch intact in BL LE; LUE diffusely reduced compared to LUE; absent digits 1-3    Coordination: No apparent tremors. No ataxia  Spasticity: MAS 0 in all extremities.       Strength:                RUE: 5-/5 SA, 5-/5 EF, 5-/5 EE, 5-/5 WE, 5-/5 FF, 5-/5 FA                LUE:  1/5 SA, 3/5 EF, 3/5 EE,  3/5 WE, 4/5 FF, 4/5 FA                RLE: 5/5 HF, 5/5 KE, 5/5  DF, 5/5  EHL, 5/5  PF                 LLE:  5/5 HF, 5/5 KE, 5/5  DF, 5/5  EHL, 5/5  PF   Prior neuro assessment is c/w today's exam 03/22/2024.   Assessment/Plan: 1. Functional deficits which require 3+ hours per day of interdisciplinary therapy in a comprehensive inpatient rehab setting. Physiatrist is providing close team supervision and 24 hour management of active medical problems listed below. Physiatrist and rehab team continue to assess barriers to discharge/monitor patient progress toward functional and medical goals  Care Tool:  Bathing    Body parts bathed by patient: Chest, Abdomen, Right upper leg, Left upper leg, Right lower leg, Left lower leg, Face, Front perineal area  Body parts bathed by helper: Right arm, Left arm, Buttocks     Bathing assist Assist Level: Moderate Assistance - Patient 50 - 74%     Upper Body Dressing/Undressing Upper body dressing   What is the patient wearing?: Pull over shirt    Upper body assist Assist Level: Maximal Assistance - Patient 25 - 49%    Lower Body Dressing/Undressing Lower body dressing      What is the patient wearing?: Pants     Lower body assist Assist for lower body dressing: Moderate Assistance - Patient 50 - 74%     Toileting Toileting Toileting Activity did not occur (Clothing management and hygiene only): N/A (no void or bm)  Toileting assist Assist for toileting: Moderate Assistance - Patient 50 - 74%     Transfers Chair/bed transfer  Transfers assist     Chair/bed transfer assist level: Minimal Assistance - Patient > 75%     Locomotion Ambulation   Ambulation assist      Assist level: Moderate Assistance - Patient 50 - 74%   Max distance: 150   Walk 10 feet activity   Assist     Assist level: Minimal Assistance - Patient > 75% Assistive device: Walker-rolling   Walk 50 feet activity   Assist    Assist level:  Minimal Assistance - Patient > 75% Assistive device: Walker-rolling    Walk 150 feet activity   Assist    Assist level: Minimal Assistance - Patient > 75% Assistive device: Walker-rolling    Walk 10 feet on uneven surface  activity   Assist Walk 10 feet on uneven surfaces activity did not occur: Safety/medical concerns         Wheelchair     Assist Is the patient using a wheelchair?: No             Wheelchair 50 feet with 2 turns activity    Assist            Wheelchair 150 feet activity     Assist          Blood pressure 117/75, pulse (!) 101, temperature 97.9 F (36.6 C), resp. rate 17, height 5' 8 (1.727 m), weight 108.3 kg, SpO2 100%.   Medical Problem List and Plan: 1. Functional deficits secondary to cervical myelopathy status post C3-7 PSF, C4-6 decompression 03/15/2024 with postoperative left upper extremity weakness as well as history of cervical laminectomy 01/07/2022 as well as 11/12/2021.  Cervical collar as directed             -patient may shower with incisions covered              -ELOS/Goals: 10-12 days, Min A PT, OT             -Continue CIR  - Therapy reports surgical dressing got wet after shower, will remove and start dry dressing daily as it has been 7 days since her surgery   2.  Antithrombotics: -DVT/anticoagulation:  Pharmaceutical: Lovenox  check vascular study             -antiplatelet therapy: N/A 3. 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 Did fail pain management with oxycodone  IR prior  to dilaudid .   4. Mood/Behavior/Sleep/ADHD: Ritalin  20 mg every morning, Effexor  75 mg daily, Atarax  50 mg nightly as needed sleep,              -antipsychotic agents: N/A              - added elavil  25 mg at bedtime   5. Neuropsych/cognition: This patient is capable of making decisions on her own behalf. 6. Skin/Wound Care: Routine skin checks 7. Fluids/Electrolytes/Nutrition: Routine in and outs with follow-up chemistries 8.  Hypertension.  Aldactone  100 mg twice daily, minoxidil  2.5 mg daily.  Monitor with increased mobility     03/22/2024   12:00 PM 03/22/2024   11:00 AM 03/22/2024    8:56 AM  Vitals with BMI  Weight  238 lbs 13 oz   BMI  36.32   Systolic 117  115  Diastolic 75  74  Pulse 101  102  -6/23 decrease Aldactone  to 25 mg twice daily due to relative hypotension  9.  Constipation.  Colace 100 mg twice daily, MiraLAX  daily as needed  -6/23 patient got sorbitol  today monitor for response, increase Senokot to 2 tabs 10.  Diabetes mellitus.  Hemoglobin A1c 5.8.  Blood sugars 103-94-106-134.  Patient on Mounjaro prior to admission and resume at discharge. CBG (last 3)  No results for input(s): GLUCAP in the last 72 hours. Monitor glucose on BMP  11.  Hyperlipidemia.  Lipitor 40 mg daily 12.  History of asthma.  Singulair  10 mg daily.  Check oxygen saturations every shift 13.  Class I obesity.  BMI 35.28.  Dietary follow-up  14. Hx b/l CTS surgery; LUE weakness s/p PSF. L digit 1-3 numbness on exam, recommend OP EMG/ncs on discharge.   15.  Leukocytosis  - 6/23 improved to 11.1 from 12.3.  Monitor for signs of infection 16.  Mild tachycardia  - Continue to monitor trend for now, encourage oral fluids    LOS: 3 days A FACE TO FACE EVALUATION WAS PERFORMED  Murray Collier 03/22/2024, 1:07 PM

## 2024-03-23 DIAGNOSIS — I951 Orthostatic hypotension: Secondary | ICD-10-CM

## 2024-03-23 LAB — CBC WITH DIFFERENTIAL/PLATELET
Abs Immature Granulocytes: 0.14 10*3/uL — ABNORMAL HIGH (ref 0.00–0.07)
Basophils Absolute: 0.1 10*3/uL (ref 0.0–0.1)
Basophils Relative: 0 %
Eosinophils Absolute: 0.5 10*3/uL (ref 0.0–0.5)
Eosinophils Relative: 4 %
HCT: 31.4 % — ABNORMAL LOW (ref 36.0–46.0)
Hemoglobin: 10.2 g/dL — ABNORMAL LOW (ref 12.0–15.0)
Immature Granulocytes: 1 %
Lymphocytes Relative: 27 %
Lymphs Abs: 3.4 10*3/uL (ref 0.7–4.0)
MCH: 28.1 pg (ref 26.0–34.0)
MCHC: 32.5 g/dL (ref 30.0–36.0)
MCV: 86.5 fL (ref 80.0–100.0)
Monocytes Absolute: 1.3 10*3/uL — ABNORMAL HIGH (ref 0.1–1.0)
Monocytes Relative: 10 %
Neutro Abs: 7.2 10*3/uL (ref 1.7–7.7)
Neutrophils Relative %: 58 %
Platelets: 350 10*3/uL (ref 150–400)
RBC: 3.63 MIL/uL — ABNORMAL LOW (ref 3.87–5.11)
RDW: 14.3 % (ref 11.5–15.5)
WBC: 12.5 10*3/uL — ABNORMAL HIGH (ref 4.0–10.5)
nRBC: 0 % (ref 0.0–0.2)

## 2024-03-23 LAB — COMPREHENSIVE METABOLIC PANEL WITH GFR
ALT: 9 U/L (ref 0–44)
AST: 12 U/L — ABNORMAL LOW (ref 15–41)
Albumin: 3.1 g/dL — ABNORMAL LOW (ref 3.5–5.0)
Alkaline Phosphatase: 70 U/L (ref 38–126)
Anion gap: 9 (ref 5–15)
BUN: 7 mg/dL (ref 6–20)
CO2: 25 mmol/L (ref 22–32)
Calcium: 8.8 mg/dL — ABNORMAL LOW (ref 8.9–10.3)
Chloride: 100 mmol/L (ref 98–111)
Creatinine, Ser: 0.98 mg/dL (ref 0.44–1.00)
GFR, Estimated: >60 mL/min (ref >60)
Glucose, Bld: 110 mg/dL — ABNORMAL HIGH (ref 70–99)
Potassium: 3.8 mmol/L (ref 3.5–5.1)
Sodium: 134 mmol/L — ABNORMAL LOW (ref 135–145)
Total Bilirubin: 0.4 mg/dL (ref 0.0–1.2)
Total Protein: 6.4 g/dL — ABNORMAL LOW (ref 6.5–8.1)

## 2024-03-23 MED ORDER — BISACODYL 10 MG RE SUPP: 10.0000 mg | Freq: Every day | RECTAL | Status: DC | PRN

## 2024-03-23 MED ORDER — HYDROMORPHONE HCL 2 MG PO TABS
2.0000 mg | ORAL_TABLET | Freq: Once | ORAL | Status: AC
  Administered 2024-03-23: 2 mg via ORAL

## 2024-03-23 MED ORDER — FLEET ENEMA RE ENEM: 1.0000 | ENEMA | Freq: Every day | RECTAL | Status: DC | PRN

## 2024-03-23 MED ORDER — POLYETHYLENE GLYCOL 3350 17 GM/SCOOP PO POWD
119.0000 g | Freq: Once | ORAL | Status: AC
  Administered 2024-03-23: 119 g via ORAL
  Filled 2024-03-23: qty 119

## 2024-03-23 MED ORDER — ALUM & MAG HYDROXIDE-SIMETH 200-200-20 MG/5ML PO SUSP
30.0000 mL | ORAL | Status: DC | PRN
  Administered 2024-03-23 – 2024-03-25 (×4): 30 mL via ORAL
  Filled 2024-03-23 (×4): qty 30

## 2024-03-23 MED ORDER — LIDOCAINE HCL URETHRAL/MUCOSAL 2 % EX GEL
CUTANEOUS | Status: DC | PRN
  Filled 2024-03-23: qty 6

## 2024-03-23 NOTE — Progress Notes (Signed)
 Occupational Therapy Session Note  Patient Details  Name: Eileen Chaney MRN: [id number] Date of Birth: Aug 24, 1976  Today's Date: 03/23/2024 OT Individual Time: 1405-1508 OT Individual Time Calculation (min): 63 min    Short Term Goals: Week 1:  OT Short Term Goal 1 (Week 1): Patient will direct position of left arm for comfort during self care activity OT Short Term Goal 2 (Week 1): Patient will utilize left hand to wash thighs, right forearm with min assist OT Short Term Goal 3 (Week 1): Patient will  complete toilet trasnfer with close supervision OT Short Term Goal 4 (Week 1): Patient will complete toileting with mod assist  Skilled Therapeutic Interventions/Progress Updates:    Patient found in wheelchair agreeable to skilled OT. Patient completed toileting in room with minA For balance to and from the toilet . Patient completed functional mobility to gym with minA with RW> patient able to complete UE strengthening, gentle ROM, and functional UE movement with UE weight off loaded to increase muscular function. Patient able to complete activities with good sitting balance, however maximum pain reported. Patient demonstrated high distractibility in high stimuli environments. Patient demonstrated fair balance in high distraction environment. Patient educated on bed exercises to complete to increase UE strengthening. Reported to medical team about patients cognitive function and pain.    Therapy Documentation Precautions:  Precautions Precautions: Cervical, Fall Required Braces or Orthoses: Cervical Brace Cervical Brace: For comfort Restrictions Weight Bearing Restrictions Per Provider Order: No General: General PT Missed Treatment Reason: Patient fatigue;Other (Comment) (lightheadedness/dizziness)  Pain: 8/10  Therapy/Group: Individual Therapy  D'mariea L Shean Gerding 03/23/2024, 4:02 PM

## 2024-03-23 NOTE — Progress Notes (Addendum)
 PROGRESS NOTE   Subjective/Complaints: Pt with dizziness/orthostatic hypotension this AM. No BM in several days. Continues to have pain in her neck.  Reports trouble emptying bladder fully.  She feels fatigued at times.   ROS: Patient denies fever, new vision changes,  nausea, vomiting, diarrhea,  shortness of breath or chest pain, headache, or mood change. + dizziness + constipation  Objective:   No results found. Recent Labs    03/22/24 0506  WBC 11.1*  HGB 9.6*  HCT 30.4*  PLT 305   Recent Labs    03/22/24 0506  NA 133*  K 3.7  CL 103  CO2 27  GLUCOSE 161*  BUN 8  CREATININE 0.77  CALCIUM  8.1*    Intake/Output Summary (Last 24 hours) at 03/23/2024 1019 Last data filed at 03/22/2024 1825 Gross per 24 hour  Intake 480 ml  Output --  Net 480 ml        Physical Exam: Vital Signs Blood pressure 105/80, pulse (!) 108, temperature 98.5 F (36.9 C), resp. rate 18, height 5' 8 (1.727 m), weight 108.3 kg, SpO2 100%.  Physical Exam Constitutional: No apparent distress. Appropriate appearance for age. Sitting in WC HENT: No JVD. + soft C collar Cardiovascular: RRR, no MRG Respiratory: CTAB.  Nonlabored breathing on room air Abdomen: + bowel sounds, normoactive. No distention or tenderness.  Skin: C/D/I. No apparent lesions.  Honeycomb dressing incision CDI   MSK:      No apparent deformity. + LUE sling for comfort      + TTP L posterior shoulder - no palpable deformity   Neurologic exam: Alert and awake follows commands, cranial nerves II through XII grossly intact Sensation: To light touch intact in BL LE; LUE diffusely reduced compared to LUE; absent digits 1-3     Coordination: No apparent tremors. No ataxia  Spasticity: MAS 0 in all extremities.       Strength:                RUE: 5-/5 SA, 5-/5 EF, 5-/5 EE, 5-/5 WE, 5-/5 FF, 5-/5 FA                LUE:  1/5 SA, 3/5 EF, 3/5 EE, 3/5 WE, 4/5 FF, 4/5  FA                RLE: 5/5 HF, 5/5 KE, 5/5  DF, 5/5  EHL, 5/5  PF                 LLE:  5/5 HF, 5/5 KE, 5/5  DF, 5/5  EHL, 5/5  PF    Prior neuro assessment is c/w today's exam 03/23/2024.   Assessment/Plan: 1. Functional deficits which require 3+ hours per day of interdisciplinary therapy in a comprehensive inpatient rehab setting. Physiatrist is providing close team supervision and 24 hour management of active medical problems listed below. Physiatrist and rehab team continue to assess barriers to discharge/monitor patient progress toward functional and medical goals  Care Tool:  Bathing    Body parts bathed by patient: Chest, Abdomen, Right upper leg, Left upper leg, Right lower leg, Left lower leg, Face, Front perineal area  Body parts bathed by helper: Right arm, Left arm, Buttocks     Bathing assist Assist Level: Moderate Assistance - Patient 50 - 74%     Upper Body Dressing/Undressing Upper body dressing   What is the patient wearing?: Pull over shirt    Upper body assist Assist Level: Maximal Assistance - Patient 25 - 49%    Lower Body Dressing/Undressing Lower body dressing      What is the patient wearing?: Pants     Lower body assist Assist for lower body dressing: Moderate Assistance - Patient 50 - 74%     Toileting Toileting Toileting Activity did not occur (Clothing management and hygiene only): N/A (no void or bm)  Toileting assist Assist for toileting: Moderate Assistance - Patient 50 - 74%     Transfers Chair/bed transfer  Transfers assist     Chair/bed transfer assist level: Minimal Assistance - Patient > 75%     Locomotion Ambulation   Ambulation assist      Assist level: Moderate Assistance - Patient 50 - 74%   Max distance: 150   Walk 10 feet activity   Assist     Assist level: Minimal Assistance - Patient > 75% Assistive device: Walker-rolling   Walk 50 feet activity   Assist    Assist level: Minimal Assistance -  Patient > 75% Assistive device: Walker-rolling    Walk 150 feet activity   Assist    Assist level: Minimal Assistance - Patient > 75% Assistive device: Walker-rolling    Walk 10 feet on uneven surface  activity   Assist Walk 10 feet on uneven surfaces activity did not occur: Safety/medical concerns         Wheelchair     Assist Is the patient using a wheelchair?: No             Wheelchair 50 feet with 2 turns activity    Assist            Wheelchair 150 feet activity     Assist          Blood pressure 105/80, pulse (!) 108, temperature 98.5 F (36.9 C), resp. rate 18, height 5' 8 (1.727 m), weight 108.3 kg, SpO2 100%.   Medical Problem List and Plan: 1. Functional deficits secondary to cervical myelopathy status post C3-7 PSF, C4-6 decompression 03/15/2024 with postoperative left upper extremity weakness as well as history of cervical laminectomy 01/07/2022 as well as 11/12/2021.  Cervical collar as directed             -patient may shower with incisions covered              -ELOS/Goals: 10-12 days, Min A PT, OT             -Continue CIR             - Therapy reports surgical dressing got wet after shower, will remove and start dry dressing daily as it has been 7 days since her surgery  -Team conference tomorrow   2.  Antithrombotics: -DVT/anticoagulation:  Pharmaceutical: Lovenox  check vascular study             -antiplatelet therapy: N/A 3. 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 Did fail pain management with  oxycodone  IR prior to dilaudid .   4. Mood/Behavior/Sleep/ADHD: Ritalin  20 mg every morning, Effexor  75 mg daily, Atarax  50 mg nightly as needed sleep,              -antipsychotic agents: N/A              - added elavil  25 mg at bedtime   5. Neuropsych/cognition: This patient is capable of making decisions on her own behalf. 6. Skin/Wound Care: Routine skin checks 7. Fluids/Electrolytes/Nutrition: Routine in and outs with follow-up chemistries 8.  Hypertension.  Aldactone  100 mg twice daily, minoxidil  2.5 mg daily.  Monitor with increased mobility      03/23/2024    9:00 AM 03/23/2024    6:26 AM 03/22/2024    8:08 PM  Vitals with BMI  Systolic 105 105 887  Diastolic 80 83 76  Pulse 108 110 103     -6/23 decrease Aldactone  to 25 mg twice daily due to relative hypotension -6/24 will stop aldactone  and menoxidil- this was for hair growth   9.  Constipation.  Colace 100 mg twice daily, MiraLAX  daily as needed             -6/23 patient got sorbitol  today monitor for response, increase Senokot to 2 tabs  -6/24 1/2 container bowel prep, enema/suppository if no results-ordered PRN 10.  Diabetes mellitus.  Hemoglobin A1c 5.8.  Blood sugars 103-94-106-134.  Patient on Mounjaro prior to admission and resume at discharge. CBG (last 3)  No results for input(s): GLUCAP in the last 72 hours.    11.  Hyperlipidemia.  Lipitor 40 mg daily 12.  History of asthma.  Singulair  10 mg daily.  Check oxygen saturations every shift 13.  Class I obesity.  BMI 35.28.  Dietary follow-up  14. Hx b/l CTS surgery; LUE weakness s/p PSF. L digit 1-3 numbness on exam, recommend OP EMG/ncs on discharge.   15.  Leukocytosis             - 6/23 improved to 11.1 from 12.3.  Monitor for signs of infection  Recheck thursday 16.  Mild tachycardia             - Continue to monitor trend for now, encourage oral fluids 17. Orthostatic hypotension  -Encourage fluids, adjust medications as above  -ABD binder/TEDs 18.  Possible neurogenic bladder  -PVR/Bladder scan    LOS: 4 days A FACE TO FACE EVALUATION WAS PERFORMED  Murray Collier 03/23/2024, 10:19 AM

## 2024-03-23 NOTE — Progress Notes (Signed)
 Patient ID: Eileen Chaney, female   DOB: 1976/07/14, 48 y.o.   MRN: [id number]  Pt needs a letter for a ramp for her entry steps. Have gotten a letter from MD and will give to pt once he signs it.

## 2024-03-23 NOTE — Progress Notes (Addendum)
 Physical Therapy Session Note  Patient Details  Name: Eileen Chaney MRN: [id number] Date of Birth: [birth year]  Today's Date: 03/23/2024 PT Individual Time: 0830-0920, 8949-8795 PT Individual Time Calculation (min): 50 min, 74 min  PT missed time: 25 min   Short Term Goals: Week 1:  PT Short Term Goal 1 (Week 1): Pt will increase bed mobility to contact gaurd. PT Short Term Goal 2 (Week 1): Pt will increase transfers to contact gaurd. PT Short Term Goal 3 (Week 1): Pt will ambulate 159 feet with LRAD and contact guard PT Short Term Goal 4 (Week 1): Pt will ascend/descend 4 stairs with 1 rail and min A. PT Short Term Goal 5 (Week 1): Pt will increase standing balance to contact gaurd  Skilled Therapeutic Interventions/Progress Updates:      Treatment Session 1   Pt seated EOB upon arrival. Pt agreeable to therapy. Pt reports L shoulder pain, not quantified, premedicated. Therapist provided rest breaks and repositioning as needed.   Pt ambulated 80 feet with no AD and R HHA.   Discussed car pt is discharging home in. Pt reports she has a 32 inch high SUV with running board entry (pt plans to have her partner measure for accuracy). Pt attempted to get in using mechanism she used at baseline however unsafe at this time. Discussed alternative option of using box step. Pt verbalized understanding and agreeable.   Pt requesting to lay down on mat table 2/2 dizziness/lightheadedness/grogginess.   Vitals assessed:   Lying on mat table: BP 94/61 HR 111 Donned ted hose Seated EOM: BP 105/80 HR 108  Pt performed stand pivot transfer mat table to WC with no AD and CGA.   Pt attempted to perform seated therex however difficult to sustain attention. Pt frequently falling asleep. Notified nursing.   Pt missed 25 min 2/2 grogginess/dizziness/lightheadedness/fatigue. Will attempt to make up missed minutes as able.   Pt seated in WC at end of session with all needs within reach and chair  alarm on.   Treatment Session 2  Pt seated in WC upon arrival. Pt agreeable to therapy. Pt reports intermittent L UE shoulder pain, not quantified however did not interfere with pt participation in therapy.   Of note: pt demonstrated L LE buckling towards end of session, possibly due to fatigue. Pt reports having increased difficulty with mobility in distracting environments, and feels as though it is similar to when she had a consussion. Notified PA, Pam.   Pt transported dependent in [facility] for time/energy conservation.   Pt navigated 12 6 inch steps with B UE support on handrails, and step to gait, pt required [facility] assist or shoulder elevation for placement of hand on handrail.   Pt performed standing marching x10 B  Pt ambulated length of main gym with no AD and min A, with increased step width and decreased step length and lateral sway. Pt ambulated 128 feet with RW and CGA, pt reports and demonstrates improved stability. Pt ambulated length of gym with no AD with and without R HHA with improved step length and R UE arm swing with verbal cues, however pt required intermittent mod A for LOB 2/2 L LE knee buckling towards end of session, possibly due to fatigue.   Pt seated in [facility] with all needs within reach and chair alarm on.      Therapy Documentation Precautions:  Precautions Precautions: Cervical, Fall Required Braces or Orthoses: Cervical Brace Cervical Brace: For comfort Restrictions Weight Bearing Restrictions Per Provider  Order: No  Therapy/Group: Individual Therapy  [facility] [place], [place], DPT  03/23/2024, 7:52 AM

## 2024-03-23 NOTE — Plan of Care (Signed)
  Problem: Consults Goal: RH GENERAL PATIENT EDUCATION Description: See Patient Education module for education specifics. Outcome: Progressing Goal: Skin Care Protocol Initiated - if Braden Score 18 or less Description: If consults are not indicated, leave blank or document N/A Outcome: Progressing   Problem: RH BOWEL ELIMINATION Goal: RH STG MANAGE BOWEL WITH ASSISTANCE Description: STG Manage Bowel with Assistance. Outcome: Progressing Goal: RH STG MANAGE BOWEL W/MEDICATION W/ASSISTANCE Description: STG Manage Bowel with Medication with Assistance. Outcome: Progressing   Problem: RH BLADDER ELIMINATION Goal: RH STG MANAGE BLADDER WITH ASSISTANCE Description: STG Manage Bladder With Assistance Outcome: Progressing Goal: RH STG MANAGE BLADDER WITH EQUIPMENT WITH ASSISTANCE Description: STG Manage Bladder With Equipment With Assistance Outcome: Progressing

## 2024-03-23 NOTE — Progress Notes (Signed)
 Per therapy, patient with very distracted today when in noisy environment and had BLE almost collapse towards the end of therapy. Patient reports increase in RUE pain but did UE work with OT today. Overall pain worse LUE and dysesthesias back incision to touch. Also reports trying to use pain medication less often (overall has used 2 mg BID v/s 4 mg tid/2 mg bid) despite increase in activity today. She does report that she tried to push herself as she felt good and BLE were getting tired prior to collapse. Also reports hesitancy for past 2-3 days --has to strain to urinate with some discomfort that's better today. Will repeat labs and monitor voiding with PVR checks. Monitor for any signs of infection.

## 2024-03-24 ENCOUNTER — Encounter (HOSPITAL_COMMUNITY): Payer: Self-pay | Admitting: Physical Medicine & Rehabilitation

## 2024-03-24 DIAGNOSIS — F418 Other specified anxiety disorders: Secondary | ICD-10-CM

## 2024-03-24 LAB — URINALYSIS, W/ REFLEX TO CULTURE (INFECTION SUSPECTED)
Bilirubin Urine: NEGATIVE
Glucose, UA: NEGATIVE mg/dL
Hgb urine dipstick: NEGATIVE
Ketones, ur: NEGATIVE mg/dL
Nitrite: NEGATIVE
Protein, ur: NEGATIVE mg/dL
Specific Gravity, Urine: 1.011 (ref 1.005–1.030)
pH: 7 (ref 5.0–8.0)

## 2024-03-24 NOTE — Progress Notes (Signed)
 Physical Therapy Session Note  Patient Details  Name: Eileen Chaney MRN: [id number] Date of Birth: 1975/10/31  Today's Date: 03/24/2024 PT Individual Time: 0845-1000 PT Individual Time Calculation (min): 75 min   Short Term Goals: Week 1:  PT Short Term Goal 1 (Week 1): Pt will increase bed mobility to contact gaurd. PT Short Term Goal 2 (Week 1): Pt will increase transfers to contact gaurd. PT Short Term Goal 3 (Week 1): Pt will ambulate 159 feet with LRAD and contact guard PT Short Term Goal 4 (Week 1): Pt will ascend/descend 4 stairs with 1 rail and min A. PT Short Term Goal 5 (Week 1): Pt will increase standing balance to contact gaurd  Skilled Therapeutic Interventions/Progress Updates:      Treatment Session 1   Pt walking around room, with pt partner upon arrival. Pt partner reports multiple LOB. Recommended pt not get up at this time without therapy or nursing assisting until family training completed and pt more consistent with mobility/locomotion for pt and caregiver overall safety 2/2 orthostatic hypotension and intermittent buckling. Pt and pt partner verbalized understanding.   Pt denies any pain. Pt agreeable to therapy.   Vitals assessed: post ambulation to gym BP 126/83 HR 115  Pt seated EOB, pt quickly laid backwards in supine 2/2 to fatigue. Therapist expressed concerns related to pt technique with laying down. Education provided for log roll technique with maintenance of cervical precautions to reduce strain on neck. Pt verbalized understanding and agreeable. Pt returned demonstration x2.   Pt ambulated room to main gym with RW and CGA. Pt ambulated one loop around day room with RW and CGA. Donned L UE splint on RW to assist with L UE positioning on RW. Pt overall required frequent and prolonged seated rest breaks 2/2 to fatigue. Pt requesting to return to room 2/2 light/sound sensitivity. Pt reports concerns related to increased fatigue foggy headedness,  sensitivity to over stimulation, and difficulty finding her words and speaking however therapist no evidence of change in speech, during session. Pt reports difficulty sleeping last night.   Education and handout provided for 5 P's of energy conservation. Pt verablized understanding and agreeable.   Pt seated in WC at end of session with all needs within reach and bed alarm on.       Therapy Documentation Precautions:  Precautions Precautions: Cervical, Fall Required Braces or Orthoses: Cervical Brace Cervical Brace: For comfort Restrictions Weight Bearing Restrictions Per Provider Order: No  Therapy/Group: Individual Therapy  [facility] [name], [place], DPT  03/24/2024, 7:49 AM

## 2024-03-24 NOTE — Progress Notes (Signed)
 Patient ID: Eileen Chaney, female   DOB: September 23, 1976, 48 y.o.   MRN: [id number] Met with pt to give team conference update regarding goals of supervision-mod/I level and target discharge date of 7/10. She hopes to do well due to at time she will be alone. Discussed closer to discharge having who ever will be assisting at discharge come in for education. Will discuss next week at discuss setting up. Gave her the letter for ramp at home, she will get to the right people. Will continue to work on discharge needs.

## 2024-03-24 NOTE — Consult Note (Signed)
 Neuropsychological Consultation Comprehensive Inpatient Rehab   Patient:   Eileen Chaney   DOB:   [birth year]  MR Number:  [id number]  [facility] [address] [place] [address] Dept: [phone] Loc: [phone]           Date of Service:   03/24/2024  Start Time:   1 PM End Time:   2 PM  Provider/Observer:  Norleen Asa, Psy.D.       Clinical Neuropsychologist       Billing Code/Service: [phone]  Reason for Service:    Eileen Chaney is a 48 year old female referred for neuropsychological consultation due to adjustment and coping issues with history of anxiety and depression and chronic pain with recent cervical neurosurgical interventions.  The patient has been maintained on Ritalin  as one of her medications as a means of helping treat her fibromyalgia and not related to a previous diagnosis of ADHD.  Patient also has asthma, obesity, diabetes, fibromyalgia, hyperlipidemia, hypertension and posterior cervical laminectomy in April 2023.  Anterior cervical decompression discectomy was done in February 2023.  Follow-up wound debridement was done on 01/20/2022.  Patient presented to [facility] on 03/15/2024 with increasing difficulty with dexterity in her hands as well as poor balance.  Patient continued to have ongoing and left arm and shoulder pain with numbness as well as low back pain that extended into the buttocks.  No recent falls noted.  Recent neuroimaging showed severe central stenosis at C4-5 and C5-6 with foraminal stenosis from C3-C7.  Patient underwent interventions with cervical laminectomy and decompression of spinal cord and other neurosurgical interventions on her cervical spine.  Once patient stabilized she was admitted to CIR due to decreased functional mobility during her recovery time.  During today's clinical visit the patient was oriented x 4 with good  mood state.  She denied any significant exacerbation of her anxiety but describes longstanding difficulties with her cervical spine.  Patient reports that she is progressing and improving with therapy and remains motivated to continue.  We covered a wide range of topics during the visit today addressing issues related to coping strategies with longstanding difficulties and acute changes presurgical intervention.  HPI for the current admission:    HPI: Eileen Chaney is a 48 year old right-handed female with history significant for anxiety/depression as well as a component of ADHD maintained on Ritalin , asthma, class I obesity with BMI 35.28, diabetes mellitus, fibromyalgia, hyperlipidemia, hypertension, posterior cervical laminectomy 01/07/2022, anterior cervical decompression/discectomy fusion 11/12/2021 per Dr. Reeves Nine complicated by cervical wound debridement 01/20/2022. Per chart review patient lives with her daughter. She has good support from her partner daughter and son. Two-level home bed and bath on main level. Independent community ambulator. Presented to [facility]/15/2024 with increasing difficulty with dexterity in her hands as well as poor balance. She was having trouble using her phone as well as difficulty with buttons and clasping her necklaces .Eileen Chaney She continued to have ongoing neck and left arm and shoulder pain with numbness as well as low back pain that extends into her buttocks.. She has denied any recent falls. 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 Most recent preoperative labs showed a hemoglobin of 12.8 and hematocrit 40.3 on 03/03/2024 and unremarkable chemistries. Underwent posterior segmental instrumentation C3-7 as well as posterior lateral arthrodesis from C5-7 with cervical laminectomy from C4-C6 for decompression of the  spinal cord with bilateral foraminotomies and facetectomies at C3/4, C4-5, C5-6, and left  foraminotomies C6-7 03/15/2024 per Dr. Reeves Nine. Placed in a cervical brace for comfort. She did complete a course of Decadron  taper. Pain control with the use of Dilaudid  2 mg every 4 hours as needed as well as Valium  for spasms. Lovenox  added for DVT prophylaxis 03/16/2024. Follow-up imaging CT/MRI cervical spine 03/16/2024 due to complaints of increasing neck pain and acute left arm weakness showed noted degenerative changes moderate to severe spinal canal stenosis C3-4 with mild cord compression without definitive cord signal abnormality. Multilevel foraminal stenosis greatest and severe bilaterally at C3-4 and C5-6 on the left at C6-7. Moderate to severe spinal canal stenosis on the right at C6-7. 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 Therapy evaluations completed due to patient's decreased functional mobility was admitted for a comprehensive rehab program.   Medical History:   Past Medical History:  Diagnosis Date   Anxiety    Arthritis    Asthma    Cervical myelopathy with cervical radiculopathy (HCC)    Complication of anesthesia    with c sections had a had time getting her sedated   DDD (degenerative disc disease), cervical    Depression    Diabetes mellitus without complication (HCC)    type 2   Family history of adverse reaction to anesthesia    mother hard to wake up   Fibromyalgia    Hyperlipidemia    Hypertension    Idiopathic urticaria    Pneumonia    PONV (postoperative nausea and vomiting)    Undifferentiated inflammatory arthritis          Patient Active Problem List   Diagnosis Date Noted   Anxiety with depression 03/24/2024   Cervical myelopathy (HCC) 03/15/2024   Spinal stenosis in cervical region 03/15/2024   Cervical radiculopathy 03/15/2024   S/P cervical spinal fusion 03/15/2024   Wound  infection after surgery 01/20/2022   Gestational diabetes 06/02/2020   Arthritis 10/11/2019   DDD (degenerative disc disease), lumbar 10/11/2019   Hyperlipidemia 10/11/2019   Idiopathic urticaria 10/11/2019   Recurrent major depressive disorder, in partial remission (HCC) 10/11/2019   Spinal stenosis of lumbar region without neurogenic claudication 10/11/2019   Type 2 diabetes mellitus without complication, with long-term current use of insulin  (HCC) 10/11/2019   Elevated lactic acid level 09/29/2017   Leukocytosis 09/29/2017   Diabetes (HCC) 09/28/2017   Hypertension 09/28/2017   Mild asthma without complication 09/28/2017   Morbid obesity (HCC) 09/28/2017   Thalassemia 09/28/2017   GAD (generalized anxiety disorder) 07/25/2014   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, unspecified 03/14/2014    Behavioral Observation/Mental Status:   Eileen Chaney  presents as a 48 y.o.-year-old Right handed African American Female who appeared her stated age. her dress was Appropriate and she was Well Groomed and her manners were Appropriate to the situation.  her participation was indicative of Appropriate behaviors.  There were physical disabilities noted.  she displayed an appropriate level of cooperation and motivation.    Interactions:    Active Appropriate  Attention:   within normal limits and attention span and concentration were age appropriate  Memory:   within normal limits; recent and remote memory intact  Visuo-spatial:   not examined  Speech (Volume):  normal  Speech:   normal; normal  Thought Process:  Coherent and Relevant  Coherent, Linear, and Logical  Though Content:  WNL; not suicidal and not homicidal  Orientation:   person, place, time/date, and situation  Judgment:   Good  Planning:   Good  Affect:    Appropriate  Mood:    Dysphoric  Insight:   Good  Intelligence:   high  Psychiatric History:  Patient with prior history including anxiety  and depressive symptoms.  While the patient does continue with prescription of Ritalin  that was prescribed as an adjunct of therapy for residual effects of fibromyalgia and not related to previous potential diagnosis of attention deficit disorder.    Family Med/Psych History: History reviewed. No pertinent family history.   Impression/DX:   Eileen Chaney is a 48 year old female referred for neuropsychological consultation due to adjustment and coping issues with history of anxiety and depression and chronic pain with recent cervical neurosurgical interventions.  The patient has been maintained on Ritalin  as one of her medications as a means of helping treat her fibromyalgia and not related to a previous diagnosis of ADHD.  Patient also has asthma, obesity, diabetes, fibromyalgia, hyperlipidemia, hypertension and posterior cervical laminectomy in April 2023.  Anterior cervical decompression discectomy was done in February 2023.  Follow-up wound debridement was done on 01/20/2022.  Patient presented to [facility] on 03/15/2024 with increasing difficulty with dexterity in her hands as well as poor balance.  Patient continued to have ongoing and left arm and shoulder pain with numbness as well as low back pain that extended into the buttocks.  No recent falls noted.  Recent neuroimaging showed severe central stenosis at C4-5 and C5-6 with foraminal stenosis from C3-C7.  Patient underwent interventions with cervical laminectomy and decompression of spinal cord and other neurosurgical interventions on her cervical spine.  Once patient stabilized she was admitted to CIR due to decreased functional mobility during her recovery time.  During today's clinical visit the patient was oriented x 4 with good mood state.  She denied any significant exacerbation of her anxiety but describes longstanding difficulties with her cervical spine.  Patient reports that she is progressing and improving with  therapy and remains motivated to continue.  We covered a wide range of topics during the visit today addressing issues related to coping strategies with longstanding difficulties and acute changes presurgical intervention.  Disposition/Plan:  With discussions regarding potential rebound effect of Ritalin  and the fact that she is now having good pain control from her pain medicines we discussed the possibility of the patient not using Ritalin  during her rehab visit although there did not appear to be any particular or significant reason to withhold this medicine.  I discussed this with the patient as well as discussing this issue with her PA.  Final decisions about medication should be made between the patient, the PA and her attending physician.          Electronically Signed   _______________________ Norleen Asa, Psy.D. Clinical Neuropsychologist

## 2024-03-24 NOTE — Progress Notes (Signed)
 Occupational Therapy Session Note  Patient Details  Name: Eileen Chaney MRN: [id number] Date of Birth: 1976/02/03   Session 1: Today's Date: 03/24/2024 OT Individual Time: 8963-8866 OT Individual Time Calculation (min): 57 min   Session 2: Today's Date: 03/24/2024 OT Individual Time: 8559-8458 OT Individual Time Calculation (min): 61 min   Short Term Goals: Week 1:  OT Short Term Goal 1 (Week 1): Patient will direct position of left arm for comfort during self care activity OT Short Term Goal 2 (Week 1): Patient will utilize left hand to wash thighs, right forearm with min assist OT Short Term Goal 3 (Week 1): Patient will  complete toilet trasnfer with close supervision OT Short Term Goal 4 (Week 1): Patient will complete toileting with mod assist   Session 1: Skilled Therapeutic Interventions/Progress Updates:  Patient agreeable to participate in OT session. Reports 8/10 pain level.   Patient participated in skilled OT session focusing on education on condition, neural pathways. Recovery, energy conservation, pain management. Patient completed neuromuscular integration utilizing 2 electrodes place on bicep to elicit motor response. Patient demonstrated trace amounts of movement in flexion.. Therapist facilitated bicep flexion to increase ability to complete functional ADLs in order to improve ability to return home with least amount of assistance.    Session 2: Skilled Therapeutic Interventions/Progress Updates:  Patient agreeable to participate in OT session. Reports 7/10 pain level, medication administered. Patient completed toileting with sup A. Patient participated in NM reeducation utilizing NMES to maximize functional use of LUE. Patient required education on abilities, safety training, and functional mobility. OT utilized KT tape for shoulder stability to minimize pain. Elicited increased function in UE strengthening.   Therapy Documentation Precautions:   Precautions Precautions: Cervical, Fall Required Braces or Orthoses: Cervical Brace Cervical Brace: For comfort Restrictions Weight Bearing Restrictions Per Provider Order: No   Therapy/Group: Individual Therapy  D'mariea L Jaydalyn Demattia OTR/L  03/24/2024, 8:24 AM

## 2024-03-24 NOTE — Progress Notes (Signed)
 PROGRESS NOTE   Subjective/Complaints: Pt reports she had an issue in therapy yesterday when after working or a while she began to feel distracted more easily by other stimuli causing her legs to give out, no injury was reported.  PVR mildly elevated but she has not required In-N-Out catheterization.  LBM yesterday  ROS: Patient denies fever, new vision changes,  nausea, vomiting, diarrhea,  shortness of breath or chest pain, headache, or mood change. + dizziness- improved + constipation- improved  Objective:   No results found. Recent Labs    03/22/24 0506 03/23/24 1605  WBC 11.1* 12.5*  HGB 9.6* 10.2*  HCT 30.4* 31.4*  PLT 305 350   Recent Labs    03/22/24 0506 03/23/24 1605  NA 133* 134*  K 3.7 3.8  CL 103 100  CO2 27 25  GLUCOSE 161* 110*  BUN 8 7  CREATININE 0.77 0.98  CALCIUM  8.1* 8.8*    Intake/Output Summary (Last 24 hours) at 03/24/2024 1053 Last data filed at 03/24/2024 0901 Gross per 24 hour  Intake 480 ml  Output 103 ml  Net 377 ml        Physical Exam: Vital Signs Blood pressure 113/68, pulse (!) 103, temperature 98.5 F (36.9 C), resp. rate 18, height 5' 8 (1.727 m), weight 108.3 kg, SpO2 96%.  Physical Exam Constitutional: No apparent distress. Appropriate appearance for age. Sitting in WC HENT: No JVD. + soft C collar Cardiovascular: RRR, no MRG Respiratory: CTAB.  Nonlabored breathing on room air Abdomen: + bowel sounds, normoactive. No distention or tenderness.  Skin: C/D/I. No apparent lesions.  Honeycomb dressing incision CDI   MSK:      No apparent deformity. + LUE sling for comfort      + TTP L posterior shoulder - no palpable deformity   Neurologic exam: Alert and awake follows commands, cranial nerves II through XII grossly intact Sensation: To light touch intact in BL LE; LUE diffusely reduced compared to LUE; absent digits 1-3     Coordination: No apparent tremors. No  ataxia  Spasticity: MAS 0 in all extremities.       Strength:                RUE: 3-4/5 SA, 5-/5 EF, 5-/5 EE, 5-/5 WE, 5-/5 FF, 5-/5 FA                LUE:  1/5 SA, 2/5 EF, 3/5 EE, 4-/5 WE, 4/5 FF, 4/5 FA                RLE: 5/5 HF, 5/5 KE, 5/5  DF, 5/5  EHL, 5/5  PF                 LLE:  5/5 HF, 5/5 KE, 5/5  DF, 5/5  EHL, 5/5  PF    Assessment/Plan: 1. Functional deficits which require 3+ hours per day of interdisciplinary therapy in a comprehensive inpatient rehab setting. Physiatrist is providing close team supervision and 24 hour management of active medical problems listed below. Physiatrist and rehab team continue to assess barriers to discharge/monitor patient progress toward functional and medical goals  Care Tool:  Bathing  Body parts bathed by patient: Chest, Abdomen, Right upper leg, Left upper leg, Right lower leg, Left lower leg, Face, Front perineal area   Body parts bathed by helper: Right arm, Left arm, Buttocks     Bathing assist Assist Level: Moderate Assistance - Patient 50 - 74%     Upper Body Dressing/Undressing Upper body dressing   What is the patient wearing?: Pull over shirt    Upper body assist Assist Level: Maximal Assistance - Patient 25 - 49%    Lower Body Dressing/Undressing Lower body dressing      What is the patient wearing?: Pants     Lower body assist Assist for lower body dressing: Moderate Assistance - Patient 50 - 74%     Toileting Toileting Toileting Activity did not occur (Clothing management and hygiene only): N/A (no void or bm)  Toileting assist Assist for toileting: Moderate Assistance - Patient 50 - 74%     Transfers Chair/bed transfer  Transfers assist     Chair/bed transfer assist level: Minimal Assistance - Patient > 75%     Locomotion Ambulation   Ambulation assist      Assist level: Contact Guard/Touching assist Assistive device: Walker-rolling Max distance: 128   Walk 10 feet  activity   Assist     Assist level: Contact Guard/Touching assist Assistive device: Walker-rolling   Walk 50 feet activity   Assist    Assist level: Contact Guard/Touching assist Assistive device: Walker-rolling    Walk 150 feet activity   Assist    Assist level: Minimal Assistance - Patient > 75% Assistive device: Walker-rolling    Walk 10 feet on uneven surface  activity   Assist Walk 10 feet on uneven surfaces activity did not occur: Safety/medical concerns         Wheelchair     Assist Is the patient using a wheelchair?: No             Wheelchair 50 feet with 2 turns activity    Assist            Wheelchair 150 feet activity     Assist          Blood pressure 113/68, pulse (!) 103, temperature 98.5 F (36.9 C), resp. rate 18, height 5' 8 (1.727 m), weight 108.3 kg, SpO2 96%.   Medical Problem List and Plan: 1. Functional deficits secondary to cervical myelopathy status post C3-7 PSF, C4-6 decompression 03/15/2024 with postoperative left upper extremity weakness as well as history of cervical laminectomy 01/07/2022 as well as 11/12/2021.  Cervical collar as directed             -patient may shower with incisions covered              -ELOS/Goals: 10-12 days, Min A PT, OT             -Continue CIR             - Therapy reports surgical dressing got wet after shower, will remove and start dry dressing daily as it has been 7 days since her surgery  -Team conference today please see physician documentation under team conference tab, met with team  to discuss problems,progress, and goals. Formulized individual treatment plan based on medical history, underlying problem and comorbidities.     2.  Antithrombotics: -DVT/anticoagulation:  Pharmaceutical: Lovenox  check vascular study             -antiplatelet therapy: N/A 3. 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 Did fail pain management with oxycodone  IR prior to dilaudid .   4. Mood/Behavior/Sleep/ADHD: Ritalin  20 mg every morning, Effexor  75 mg daily, Atarax  50 mg nightly as needed sleep,              -antipsychotic agents: N/A              - added elavil  25 mg at bedtime   5. Neuropsych/cognition: This patient is capable of making decisions on her own behalf. 6. Skin/Wound Care: Routine skin checks 7. Fluids/Electrolytes/Nutrition: Routine in and outs with follow-up chemistries 8.  Hypertension.  Aldactone  100 mg twice daily, minoxidil  2.5 mg daily.  Monitor with increased mobility      03/24/2024    6:42 AM 03/23/2024    7:44 PM 03/23/2024    4:15 PM  Vitals with BMI  Systolic 113 110 881  Diastolic 68 80 77  Pulse 103 109      -6/23 decrease Aldactone  to 25 mg twice daily due to relative hypotension -6/24 will stop aldactone  and menoxidil- this was for hair growth -6/25 BP soft but stable, continue current    9.  Constipation.  Colace 100 mg twice daily, MiraLAX  daily as needed             -6/23 patient got sorbitol  today monitor for response, increase Senokot to 2 tabs  -6/24 1/2 container bowel prep, enema/suppository if no results-ordered PRN  -6/25 LBM yesterday, improved 10.  Diabetes mellitus.  Hemoglobin A1c 5.8.  Blood sugars 103-94-106-134.  Patient on Mounjaro prior to admission and resume at discharge. CBG (last 3)  No results for input(s): GLUCAP in the last 72 hours. Glucose stable on BMP 03/23/24 history 03/23/24   11.  Hyperlipidemia.  Lipitor 40 mg daily 12.  History of asthma.  Singulair  10 mg daily.  Check  oxygen saturations every shift 13.  Class I obesity.  BMI 35.28.  Dietary follow-up  14. Hx b/l CTS surgery; LUE weakness s/p PSF. L digit 1-3 numbness on exam, recommend OP EMG/ncs on discharge.   15.  Leukocytosis             - 6/23 improved to 11.1 from 12.3.  Monitor for signs of infection  -6/25 WBC 12.5 yesterday, monitor for signs/symptoms of infection 16.  Mild tachycardia             - Continue to monitor trend for now, encourage oral fluids  -Ritalin  may be contributing- she says this was started for fibromyalgia 17. Orthostatic hypotension  -Encourage fluids, adjust medications as above  -ABD binder/TEDs 18. Possible neurogenic bladder  -PVR/Bladder scan-   -6/25 mildly elevated PVRs, not needing IC yet, discussed timed voids/double voids. Hold off on flomax due to hypotension. U/A does not indicate likely UTI    LOS: 5 days A FACE TO FACE EVALUATION WAS PERFORMED  Murray Collier 03/24/2024, 10:53 AM

## 2024-03-25 DIAGNOSIS — R682 Dry mouth, unspecified: Secondary | ICD-10-CM

## 2024-03-25 DIAGNOSIS — R1013 Epigastric pain: Secondary | ICD-10-CM

## 2024-03-25 MED ORDER — BIOTENE DRY MOUTH MT LIQD: 15.0000 mL | OROMUCOSAL | Status: DC | PRN

## 2024-03-25 MED ORDER — PANTOPRAZOLE SODIUM 40 MG PO TBEC
40.0000 mg | DELAYED_RELEASE_TABLET | Freq: Every day | ORAL | Status: DC
  Administered 2024-03-25 – 2024-03-29 (×5): 40 mg via ORAL
  Filled 2024-03-25 (×8): qty 1

## 2024-03-25 MED ORDER — POLYETHYLENE GLYCOL 3350 17 G PO PACK
17.0000 g | PACK | Freq: Every day | ORAL | Status: DC
  Administered 2024-03-25 – 2024-03-31 (×3): 17 g via ORAL
  Filled 2024-03-25 (×7): qty 1

## 2024-03-25 NOTE — Progress Notes (Signed)
 Occupational Therapy Session Note  Patient Details  Name: Eileen Chaney MRN: [id number] Date of Birth: 08-04-1976  Today's Date: 03/25/2024 OT Individual Time: 1045-1130 OT Individual Time Calculation (min): 45 min    Short Term Goals: Week 1:  OT Short Term Goal 1 (Week 1): Patient will direct position of left arm for comfort during self care activity OT Short Term Goal 2 (Week 1): Patient will utilize left hand to wash thighs, right forearm with min assist OT Short Term Goal 3 (Week 1): Patient will  complete toilet trasnfer with close supervision OT Short Term Goal 4 (Week 1): Patient will complete toileting with mod assist  Skilled Therapeutic Interventions/Progress Updates:      Therapy Documentation Precautions:  Precautions Precautions: Cervical, Fall Required Braces or Orthoses: Cervical Brace Cervical Brace: For comfort Restrictions Weight Bearing Restrictions Per Provider Order: No General:  Pt supine in bed upon OT arrival, agreeable to OT session.  Pain:  5/10 pain reported in Lt shoulder, activity, intermittent rest breaks, distractions provided for pain management, pt reports tolerable to proceed.   ADL: OT providing skilled intervention on ADL retraining in order to increase independence with tasks and increase activity tolerance. Pt completed the following tasks at the current level of assist: Bed mobility: SBA supine><EOB with HOB raised Toilet transfer: SBA ambulating from bed><toilet Toileting: SBA with urine void, able to complete 3/3 steps  Other Treatments: OT providing therapeutic use of self in order to build rapport and discuss patient current situation with pain/visual changes, etc. and goals for therapy. OT providing gentle soft tissue massage after using heat modality to upper/middle trapezius in order to provide pain relief and release tight musles. No noted muscle notes, only increased areas of tenderness along Lt upper trapezius. OT also  educating pt on taping techniques to use at home once D/C.    Pt supine in bed with bed alarm activated, 2 bed rails up, call light within reach and 4Ps assessed.   Therapy/Group: Individual Therapy  Camie Hoe, OTD, OTR/L 03/25/2024, 12:59 PM

## 2024-03-25 NOTE — Plan of Care (Signed)
  Problem: RH Bathing Goal: LTG Patient will bathe all body parts with assist levels (OT) Description: LTG: Patient will bathe all body parts with assist levels (OT) Flowsheets (Taken 03/25/2024 1556) LTG: Pt will perform bathing with assistance level/cueing: (upgraded) Supervision/Verbal cueing Note: upgraded   Problem: RH Dressing Goal: LTG Patient will perform upper body dressing (OT) Description: LTG Patient will perform upper body dressing with assist, with/without cues (OT). Flowsheets (Taken 03/25/2024 1556) LTG: Pt will perform upper body dressing with assistance level of: (upgraded) Set up assist Note: upgraded Goal: LTG Patient will perform lower body dressing w/assist (OT) Description: LTG: Patient will perform lower body dressing with assist, with/without cues in positioning using equipment (OT) Flowsheets (Taken 03/25/2024 1556) LTG: Pt will perform lower body dressing with assistance level of: Contact Guard/Touching assist Note: upgraded   Problem: RH Toileting Goal: LTG Patient will perform toileting task (3/3 steps) with assistance level (OT) Description: LTG: Patient will perform toileting task (3/3 steps) with assistance level (OT)  Flowsheets (Taken 03/25/2024 1600) LTG: Pt will perform toileting task (3/3 steps) with assistance level: (upgraded) Contact Guard/Touching assist Note: upgraded

## 2024-03-25 NOTE — Progress Notes (Signed)
 PROGRESS NOTE   Subjective/Complaints: Pt walked 300+ feet in PT, reported some dizziness and double vision. Reports dry mouth.  Has had heartburn after eating.   ROS: Patient denies fever, new vision changes,  nausea, vomiting, diarrhea,  shortness of breath or chest pain, headache, or mood change. + dizziness/double vision- intermittent + constipation- improved + heartburn  Objective:   No results found. Recent Labs    03/23/24 1605  WBC 12.5*  HGB 10.2*  HCT 31.4*  PLT 350   Recent Labs    03/23/24 1605  NA 134*  K 3.8  CL 100  CO2 25  GLUCOSE 110*  BUN 7  CREATININE 0.98  CALCIUM  8.8*    Intake/Output Summary (Last 24 hours) at 03/25/2024 1434 Last data filed at 03/25/2024 0903 Gross per 24 hour  Intake 720 ml  Output --  Net 720 ml        Physical Exam: Vital Signs Blood pressure 118/66, pulse 100, temperature 98.5 F (36.9 C), temperature source Oral, resp. rate 18, height 5' 8 (1.727 m), weight 108.3 kg, SpO2 98%.  Physical Exam Constitutional: No apparent distress. Appropriate appearance for age. Laying in bed HENT: No JVD, Membranes a little dry Cardiovascular: RRR, no MRG Respiratory: CTAB.  Nonlabored breathing on room air Abdomen: + bowel sounds, normoactive. No distention or tenderness.  Skin: C/D/I. No apparent lesions.  Honeycomb dressing incision CDI   MSK:      No apparent deformity. + LUE sling for comfort      + TTP L posterior shoulder - no palpable deformity   Neurologic exam: Alert and awake follows commands, cranial nerves II through XII grossly intact, EOMI Sensation: To light touch intact in BL LE; LUE diffusely reduced compared to LUE; absent digits 1-3     Coordination: No apparent tremors. No ataxia  Spasticity: MAS 0 in all extremities.       Strength:                RUE: 3-4/5 SA, 5-/5 EF, 5-/5 EE, 5-/5 WE, 5-/5 FF, 5-/5 FA                LUE:  1/5 SA, 2/5 EF, 3/5  EE, 4-/5 WE, 4/5 FF, 4/5 FA                RLE: 5/5 HF, 5/5 KE, 5/5  DF, 5/5  EHL, 5/5  PF                 LLE:  5/5 HF, 5/5 KE, 5/5  DF, 5/5  EHL, 5/5  PF  Prior neuro assessment is c/w today's exam 03/25/2024.   Assessment/Plan: 1. Functional deficits which require 3+ hours per day of interdisciplinary therapy in a comprehensive inpatient rehab setting. Physiatrist is providing close team supervision and 24 hour management of active medical problems listed below. Physiatrist and rehab team continue to assess barriers to discharge/monitor patient progress toward functional and medical goals  Care Tool:  Bathing    Body parts bathed by patient: Chest, Abdomen, Right upper leg, Left upper leg, Right lower leg, Left lower leg, Face, Front perineal area   Body parts bathed  by helper: Right arm, Left arm, Buttocks     Bathing assist Assist Level: Moderate Assistance - Patient 50 - 74%     Upper Body Dressing/Undressing Upper body dressing   What is the patient wearing?: Pull over shirt    Upper body assist Assist Level: Maximal Assistance - Patient 25 - 49%    Lower Body Dressing/Undressing Lower body dressing      What is the patient wearing?: Pants     Lower body assist Assist for lower body dressing: Moderate Assistance - Patient 50 - 74%     Toileting Toileting Toileting Activity did not occur (Clothing management and hygiene only): N/A (no void or bm)  Toileting assist Assist for toileting: Moderate Assistance - Patient 50 - 74%     Transfers Chair/bed transfer  Transfers assist     Chair/bed transfer assist level: Minimal Assistance - Patient > 75%     Locomotion Ambulation   Ambulation assist      Assist level: Contact Guard/Touching assist Assistive device: Walker-rolling Max distance: 128   Walk 10 feet activity   Assist     Assist level: Contact Guard/Touching assist Assistive device: Walker-rolling   Walk 50 feet activity   Assist     Assist level: Contact Guard/Touching assist Assistive device: Walker-rolling    Walk 150 feet activity   Assist    Assist level: Minimal Assistance - Patient > 75% Assistive device: Walker-rolling    Walk 10 feet on uneven surface  activity   Assist Walk 10 feet on uneven surfaces activity did not occur: Safety/medical concerns         Wheelchair     Assist Is the patient using a wheelchair?: No             Wheelchair 50 feet with 2 turns activity    Assist            Wheelchair 150 feet activity     Assist          Blood pressure 118/66, pulse 100, temperature 98.5 F (36.9 C), temperature source Oral, resp. rate 18, height 5' 8 (1.727 m), weight 108.3 kg, SpO2 98%.   Medical Problem List and Plan: 1. Functional deficits secondary to cervical myelopathy status post C3-7 PSF, C4-6 decompression 03/15/2024 with postoperative left upper extremity weakness as well as history of cervical laminectomy 01/07/2022 as well as 11/12/2021.  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  Antithrombotics: -DVT/anticoagulation:  Pharmaceutical: Lovenox  check vascular study             -antiplatelet therapy: N/A 3. 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 Did fail pain management with oxycodone  IR prior to dilaudid .   4. Mood/Behavior/Sleep/ADHD: Ritalin  20 mg every morning, Effexor  75 mg daily, Atarax  50 mg nightly as needed sleep,              -antipsychotic agents: N/A              - added elavil  25 mg at bedtime   5. Neuropsych/cognition: This patient is capable of making decisions on her own behalf. 6. Skin/Wound Care: Routine skin checks 7. Fluids/Electrolytes/Nutrition: Routine in and outs with follow-up chemistries 8.  Hypertension.  Aldactone  100 mg twice daily, minoxidil  2.5 mg daily.  Monitor with increased mobility      03/25/2024    5:48 AM 03/24/2024    9:18 PM 03/24/2024    1:24 PM  Vitals with BMI  Systolic 118 116 882  Diastolic 66 74 77  Pulse 100 103 107     -6/23 decrease Aldactone  to 25 mg twice daily due to relative hypotension -6/24 will stop aldactone  and menoxidil- this was for hair growth -6/25 BP soft but stable, continue current    9.  Constipation.  Colace 100 mg twice daily, MiraLAX  daily as needed             -6/23 patient got sorbitol  today monitor for response, increase Senokot to 2 tabs  -6/24 1/2 container bowel prep, enema/suppository if no results-ordered PRN  -6/25 LBM yesterday, improved  -6/26 Schedule miralax  daily, continue Senokot 10.  Diabetes mellitus.  Hemoglobin A1c 5.8.  Blood sugars 103-94-106-134.  Patient on Mounjaro prior to admission and resume at discharge. CBG (last 3)  No results for input(s): GLUCAP in the last 72 hours. Glucose stable on BMP 03/23/24 history 03/23/24   11.  Hyperlipidemia.  Lipitor 40 mg daily 12.  History of asthma.  Singulair  10 mg daily.  Check oxygen saturations every shift 13.  Class I obesity.  BMI 35.28.  Dietary follow-up  14. Hx b/l CTS surgery; LUE weakness s/p PSF. L digit  1-3 numbness on exam, recommend OP EMG/ncs on discharge.   15.  Leukocytosis             - 6/23 improved to 11.1 from 12.3.  Monitor for signs of infection  -6/25 WBC 12.5 yesterday, monitor for signs/symptoms of infection 16.  Mild tachycardia             - Continue to monitor trend for now, encourage oral fluids  -Ritalin  may be contributing- she says this was started for fibromyalgia 17. Orthostatic hypotension  -Encourage fluids, adjust medications as above  -ABD binder/TEDs    03/25/2024    5:48 AM 03/24/2024    9:18 PM 03/24/2024    1:24 PM  Vitals with BMI  Systolic 118 116 882  Diastolic 66 74 77  Pulse 100 103 107    18. Possible neurogenic bladder  -PVR/Bladder scan-   -6/25-26 mildly elevated PVRs, not needing IC yet, discussed timed voids/double voids. Hold off on flomax due to hypotension. U/A does not indicate likely UTI 19. Dry mouth  - Biotene mouthwash 20. Dyspepsia  -Start PPI  LOS: 6 days A FACE TO FACE EVALUATION  WAS PERFORMED  Murray Collier 03/25/2024, 2:34 PM

## 2024-03-25 NOTE — Progress Notes (Signed)
 Physical Therapy Session Note  Patient Details  Name: Eileen Chaney MRN: [id number] Date of Birth: Jul 16, 1976  Today's Date: 03/25/2024 PT Individual Time: 0800-0912 PT Individual Time Calculation (min): 72 min   Short Term Goals: Week 1:  PT Short Term Goal 1 (Week 1): Pt will increase bed mobility to contact gaurd. PT Short Term Goal 2 (Week 1): Pt will increase transfers to contact gaurd. PT Short Term Goal 3 (Week 1): Pt will ambulate 159 feet with LRAD and contact guard PT Short Term Goal 4 (Week 1): Pt will ascend/descend 4 stairs with 1 rail and min A. PT Short Term Goal 5 (Week 1): Pt will increase standing balance to contact gaurd  Skilled Therapeutic Interventions/Progress Updates:      Treatment Session 1  Pt supine in bed upon arrival. Pt agreeable to therapy. Pt reports having a difficult morning 2/2 pt not requesting medication until later this morning, as well as heart burn and dry mouth. Pt requesting medication adjustments. Therapist encouraged pt to discuss this with MD during morning rounds.   Vitals:   Pt reports dizziness/blurred vision/double vision at end of ambulation. Pt reports seeing double of half of therapist head. Vitals assessed: BP 114/70 HR 98. Notified nurse, and MD of pt symptoms.   Initiated gait training with use of rollator. Pt ambulated 300+ feet with CGA/min A with fatigue multiple seated rest breaks on rollator, verbal cues provided for safety with rollator as pt demos tendency for rollator to get ahead of pt.   Pt performed seated exercises for B LE strengthening/coordination:   1x10 B LE LAQ with 2# ankle weight  1x10 B LE marching with 2# ankle weight  1x10 B LE heel/toe raises with 2# ankle weight  Pt performed stand pivot transfer x2 with use of rollator and CGA.   Pt performed sit to supine with log roll technqiue and supervision.   Pt supine in bed at end of session with all needs within reach and bed alarm on.           Therapy Documentation Precautions:  Precautions Precautions: Cervical, Fall Required Braces or Orthoses: Cervical Brace Cervical Brace: For comfort Restrictions Weight Bearing Restrictions Per Provider Order: No  Therapy/Group: Individual Therapy  [facility], , DPT  03/25/2024, 8:05 AM

## 2024-03-25 NOTE — Progress Notes (Signed)
 Occupational Therapy Session Note  Patient Details  Name: Eileen Chaney MRN: [id number] Date of Birth: 02-28-1976  Today's Date: 03/25/2024 OT Individual Time: 8694-8581 OT Individual Time Calculation (min): 73 min    Short Term Goals: Week 1:  OT Short Term Goal 1 (Week 1): Patient will direct position of left arm for comfort during self care activity OT Short Term Goal 2 (Week 1): Patient will utilize left hand to wash thighs, right forearm with min assist OT Short Term Goal 3 (Week 1): Patient will  complete toilet trasnfer with close supervision OT Short Term Goal 4 (Week 1): Patient will complete toileting with mod assist  Skilled Therapeutic Interventions/Progress Updates:    Patient agreeable to participate in OT session. Reports 5/10 pain level, reportedly took pain medicine before session. Patient participated in skilled OT session focusing on ADL participation, education on precautions, muscular facilitation, and UE strengthening to improve posture and decrease painful condition. Patient completed functional mobility to gym with CGA for safety. Patient able to tolerate UE scapular strengthening to increase function with ADLs. Patient completed activities utilizing hand over hand for facilitation of bicep function. OT utilized therapeutic taping of upper trap area to facilitate increased function and decrease tightness leading to painful condition. Patient completed UB doffing and donning of loose pull over shirt with SUP A with increased time utilizing compensatory techniques. Therapist facilitated increased UE function in order to improve ADL performance   Therapy Documentation Precautions:  Precautions Precautions: Cervical, Fall Required Braces or Orthoses: Cervical Brace Cervical Brace: For comfort Restrictions Weight Bearing Restrictions Per Provider Order: No   Therapy/Group: Individual Therapy  D'mariea L Jaliel Deavers OTR/L  03/25/2024, 7:55 AM

## 2024-03-25 NOTE — Patient Care Conference (Signed)
 Inpatient RehabilitationTeam Conference and Plan of Care Update Date: 03/24/2024   Time: 12:09 PM    Patient Name: Eileen Chaney      Medical Record Number: [id number]  Date of Birth: Sep 23, 1976 Sex: Female         Room/Bed: 4M12C/4M12C-01 Payor Info: Payor: BLUE CROSS BLUE [employer]IELD / Plan: BCBS COMM PPO / Product Type: *No Product type* /    Admit Date/Time:  03/19/2024 12:15 PM  Primary Diagnosis:  Cervical myelopathy [facility])  Hospital Problems: Principal Problem:   Cervical myelopathy (HCC) Active Problems:   Anxiety with depression    Expected Discharge Date: Expected Discharge Date: 04/08/24  Team Members Present: Physician leading conference: Dr. Murray Collier Social Worker Present: Rhoda Clement, LCSW Nurse Present: Barnie Ronde, RN PT Present: [name], PT OT Present: Delon Sharps, OT     Current Status/Progress Goal Weekly Team Focus  Bowel/Bladder      Continent of bowel and bladder although urinary retention noted; not requiring intermittent catheterization    Remain continent of bowel and bladder    Double void, offer assistance with toileting, heed the urge to complete elimination and follow established toilet routine  Swallow/Nutrition/ Hydration               ADL's   minA for UB ADLS, CGA for LB,   mod I to sup, Min A UB dressing   Compensatory ADL techniques, UB strengthening/ROM pain free, UE activity tolerance.    Mobility   bed mobility: supervision with use of bed features, sit to stand, stnad pivot tranfser: CGA/min A, gait x 80 feet with no AD and CGA/min A--1 episode requiring mod A with L LE buckling. gait x 128 feet with RW and CGA. stair navigation x12 with B handrail and CGA with step to gait   supervision/mod I  D/C: 7/10 follow up HHPT, DME needed: TBD barriers to discharge: 1 high step entry with no rails-pt looking into ramp as she had difficulty navigating these steps at baseline. -requesting paperwork to justify this to  facilities management at [facility] as pt lives in the chaplain house., flight of stairs to pt bedroom, pt reports vehicle is 32 inches high with running board entry, pt endorses sound sensitivity (pt endorses similar to when she had a concussion), pt also endorses frequent grogginess/foggyheadedness, difficulty thinking of words and speaking.    Communication                Safety/Cognition/ Behavioral Observations               Pain      Nerve pain left arm addressed   Pain < 4 with prns     Assess need for and effectiveness of prn meds q shift  Skin      Surgical incision to neck   Incision healing    Monitor skin q shift    Discharge Planning:  Home with partner who can be there along with her daughter. Will give letter for ramp to be placed into home. Will await team's recommendations. Neuro-psych to see this week   Team Discussion: Patient admitted post cervical laminectomy (ACDF) for cervical myelopathy complicated by wound debridement who continued to have ongoing neck and left UE/shoulder issues with back pain.  Central c cervical stenosis and underwent a posterior arthrodesis C 5-7 and laminectomy C 4-6 with cervical collar placement. Also limited by some component of neuropraxia of the brachial plexus exacerbating her left arm weakness versus radicular dysfunction.  Significant history of anxiety/depression and ADHD with fibromyalgia.  Patient on target to meet rehab goals: Patient having light sensitivity and dizziness with grogginess. Easily distracted and note word finding difficulties. Functional progress limited by poor coordination and balance issues.  *See Care Plan and progress notes for long and short-term goals.   Revisions to Treatment Plan:  Neuro psych consult HTN medications adjusted   Teaching Needs: Safety, medications, transfers, toileting, etc.   Current Barriers to Discharge: Home enviroment access/layout and Lack of/limited family  support  Possible Resolutions to Barriers: Family education Ramp for entry to home     Medical Summary Current Status: Cervical myelopathy, pain, hx of fibromyalgia, anxiety, consitpation, hypotension, bladder dysfunction  Barriers to Discharge: Behavior/Mood;Hypotension;Medical stability;Self-care education;Uncontrolled Pain  Barriers to Discharge Comments: Cervical myelopathy, pain, hx of fibromyalgia, anxiety, consitpation, hypotension, bladder dysfunction Possible Resolutions to [employer] Focus: pain medications, bladder scans, follow CBC, hold BP medications   Continued Need for Acute Rehabilitation Level of Care: The patient requires daily medical management by a physician with specialized training in physical medicine and rehabilitation for the following reasons: Direction of a multidisciplinary physical rehabilitation program to maximize functional independence : Yes Medical management of patient stability for increased activity during participation in an intensive rehabilitation regime.: Yes Analysis of laboratory values and/or radiology reports with any subsequent need for medication adjustment and/or medical intervention. : Yes   I attest that I was present, lead the team conference, and concur with the assessment and plan of the team.   Fredericka Sober B 03/25/2024, 10:20 AM

## 2024-03-26 DIAGNOSIS — G4700 Insomnia, unspecified: Secondary | ICD-10-CM

## 2024-03-26 MED ORDER — POLYETHYLENE GLYCOL 3350 17 G PO PACK: 17.0000 g | PACK | Freq: Every day | ORAL | Status: DC | PRN

## 2024-03-26 NOTE — Progress Notes (Signed)
 Occupational Therapy Session Note  Patient Details  Name: Eileen Chaney MRN: [id number] Date of Birth: [birth year]   Session 1: Today's Date: 03/26/2024 OT Individual Time: 9093-8981 OT Individual Time Calculation (min): 72 min    Session 2 Today's Date: 03/26/2024 OT Individual Time: 1405-1500 OT Individual Time Calculation (min): 55 min    Short Term Goals: Week 1:  OT Short Term Goal 1 (Week 1): Patient will direct position of left arm for comfort during self care activity OT Short Term Goal 2 (Week 1): Patient will utilize left hand to wash thighs, right forearm with min assist OT Short Term Goal 3 (Week 1): Patient will  complete toilet trasnfer with close supervision OT Short Term Goal 4 (Week 1): Patient will complete toileting with mod assist  Session 1: Skilled Therapeutic Interventions/Progress Updates:    Patient agreeable to participate in OT session. Reports 8/10 pain level. Pain medication administered by nursing.   Patient participated in skilled OT session focusing on pain management, functional use of LUE, UE strengthening, education, and neuromuscular education. Patient completed functional mobility to gym with increased coordination utilizing rollator for increased safety during ADLs. Patient completed neuro re education for UE bicep flexion utilizing NMES, HOH techniques to facilitate use of affected UE. Patient completed strengthening in RUE to maximize function due to painful condition and increase efficiency with hemi techniques. Patient had complaints of increased pain fatigue and dizziness. Patient educated on condition and exercises to complete independently to increase abilities.   Session 2: Skilled Therapeutic Interventions/Progress Updates:    Patient agreeable to participate in OT session. Reports 2/10 pain level. Patient received pain medication prior to OT arrival   Patient participated in skilled OT session focusing on UE ROM, activity tolerance,  strengthening, and functional use. Patient completed NuStep for 10 minutes on level 3 for increased functional activity tolerance and ROM/ use of bilateral UE due to limited AROM. Patient able to tolerate well with no pain, however patient reported severe fatigue due to medication and activity. Patient required therapeutic rest and review of medication time with RN to increase therapeutic participation due to dizziness, and slow processing reported. Patient completed UE functional use activity requiring HOH and functional facilitation of LUE to increase bicep and deltoid function and activation. Patient completed functional mobility with rollator with increased coordination to increase safety in home environment.  Therapy Documentation Precautions:  Precautions Precautions: Cervical, Fall Required Braces or Orthoses: Cervical Brace Cervical Brace: For comfort Restrictions Weight Bearing Restrictions Per Provider Order: No   Therapy/Group: Individual Therapy  Eileen Chaney [name] OTR/Chaney  03/26/2024, 7:53 AM

## 2024-03-26 NOTE — Progress Notes (Incomplete)
 Patient remains off of the department on ground pass

## 2024-03-26 NOTE — Progress Notes (Signed)
 Physical Therapy Session Note  Patient Details  Name: Eileen Chaney MRN: [id number] Date of Birth: Aug 10, 1976  Today's Date: 03/26/2024 PT Individual Time: 8961-8881 PT Individual Time Calculation (min): 40 min   Short Term Goals: Week 1:  PT Short Term Goal 1 (Week 1): Pt will increase bed mobility to contact gaurd. PT Short Term Goal 2 (Week 1): Pt will increase transfers to contact gaurd. PT Short Term Goal 3 (Week 1): Pt will ambulate 159 feet with LRAD and contact guard PT Short Term Goal 4 (Week 1): Pt will ascend/descend 4 stairs with 1 rail and min A. PT Short Term Goal 5 (Week 1): Pt will increase standing balance to contact gaurd  Skilled Therapeutic Interventions/Progress Updates: Patient supine in bed on entrance to room. Patient alert and agreeable to PT session.   Patient reported unrated pain in B UE (L>R). Manual therapy and light cervical stretch performed (attending PA stated pt can do so to pt's tolerance and in pain free ROM).  Therapeutic Activity: Bed Mobility: Pt performed supine<>sit on EOB modI (HOB elevated). Pt sitting edge of mat in main gym and laid down from short sitting onto back to stretch out B UE. PTA continued education to avoid straining posterior musculature doing this method with pt recalling log roll technique from attending PT (demonstration provided to show pt how this can be bad for posterior chain of musculature). Transfers: Pt performed sit<>stand transfers throughout session with supervision to rollator in preparation for functional mobility. Pt required VC to lock brakes when sitting. Pt ambulated from room<>main gym in rollator with supervision.  Therapeutic Exercise: - light stretch of lateral cervical flexors with VC for mechanics.  Manual Therapy: Palpation of L upper trap musculature and L latissimus dorsi performed with trigger points noted. Education and rationale provided with pt agreeing to participate in intervention. - Trigger  point release to stated area, starting with L lats while heat pack donned on L upper trap to loosen up tightness with soft tissue mobilization to follow throughout. Increased time required to adjust to pt's tolerance to trigger point release. Pt reported decrease in pain, and increase in comfortability in L UE. Pt also reported pain in R rhomboids with PTA stating to communicate with other PT's to assist.   Patient supine in bed at end of session with brakes locked, bed alarm set, and all needs within reach.      Therapy Documentation Precautions:  Precautions Precautions: Cervical, Fall Required Braces or Orthoses: Cervical Brace Cervical Brace: For comfort Restrictions Weight Bearing Restrictions Per Provider Order: No  Therapy/Group: Individual Therapy  Rod Majerus PTA 03/26/2024, 1:01 PM

## 2024-03-26 NOTE — Progress Notes (Signed)
 Physical Therapy Session Note  Patient Details  Name: Eileen Chaney MRN: [id number] Date of Birth: Apr 10, 1976  Today's Date: 03/26/2024 PT Individual Time: 0835-0905 PT Individual Time Calculation (min): 30 min   Short Term Goals: Week 1:  PT Short Term Goal 1 (Week 1): Pt will increase bed mobility to contact gaurd. PT Short Term Goal 2 (Week 1): Pt will increase transfers to contact gaurd. PT Short Term Goal 3 (Week 1): Pt will ambulate 159 feet with LRAD and contact guard PT Short Term Goal 4 (Week 1): Pt will ascend/descend 4 stairs with 1 rail and min A. PT Short Term Goal 5 (Week 1): Pt will increase standing balance to contact gaurd  Skilled Therapeutic Interventions/Progress Updates: Pt presented in room at sink with partner present. This therapist voiced safety concerns based on previous notes. Discussed conferring with primary OT regarding working on signing off for mobility in room. Pt agreeable to work on ambulation/endurance. Pt with rollator ambulated to day room with close supervision and during ambulation discussed energy conservation with pt voicing emerging awareness of fatigue levels and when to takes breaks. Pt states felt that knee buckling was due to not listening to body and working past fatigue. Pt walked around day room and nsg station and took a rest break at elevators with MD present for daily assessment. Pt then completed additional ambulation with rollator to 4C nsg station and returning to room with pt stating 5/10 fatigue level. In room pt sat EOB and transferred to supine with supervision. Pt handed off to OT and left with current needs met.      Therapy Documentation Precautions:  Precautions Precautions: Cervical, Fall Required Braces or Orthoses: Cervical Brace Cervical Brace: For comfort Restrictions Weight Bearing Restrictions Per Provider Order: No General:   Vital Signs:   Pain: Pain Assessment Pain Scale: 0-10 Pain Score: 8  Pain Type: (P)  Acute pain Pain Location: Shoulder Pain Orientation: (P) Posterior Pain Radiating Towards: (P) shoulder Pain Descriptors / Indicators: (P) Aching;Sharp Pain Frequency: (P) Constant Pain Onset: (P) On-going Patients Stated Pain Goal: (P) 4 Pain Intervention(s): Medication (See eMAR) Mobility:   Locomotion :    Trunk/Postural Assessment :    Balance:   Exercises:   Other Treatments:      Therapy/Group: Individual Therapy  Reichen Hutzler 03/26/2024, 12:49 PM

## 2024-03-26 NOTE — Progress Notes (Signed)
 PROGRESS NOTE   Subjective/Complaints: No new complaints. Working with therapy on ambulation today. No additional double vision/dizziness today.  Reports she feels better today overall, possibly related to sleeping better last night.  She feels like poor sleep has been contributing to some of her symptoms.  Dry mouth is better today.  Continues to have some heartburn symptoms.  ROS: Patient denies fever,  nausea, vomiting, diarrhea,  shortness of breath or chest pain, headache, or mood change. + dizziness/double vision- intermittent + constipation- improved + heartburn- intermittent + insomnia  Objective:   No results found. Recent Labs    03/23/24 1605  WBC 12.5*  HGB 10.2*  HCT 31.4*  PLT 350   Recent Labs    03/23/24 1605  NA 134*  K 3.8  CL 100  CO2 25  GLUCOSE 110*  BUN 7  CREATININE 0.98  CALCIUM  8.8*    Intake/Output Summary (Last 24 hours) at 03/26/2024 1007 Last data filed at 03/25/2024 2211 Gross per 24 hour  Intake 960 ml  Output --  Net 960 ml        Physical Exam: Vital Signs Blood pressure 106/84, pulse 98, temperature 98.1 F (36.7 C), temperature source Oral, resp. rate 17, height 5' 8 (1.727 m), weight 108.3 kg, SpO2 98%.  Physical Exam Constitutional: No apparent distress. Appropriate appearance for age.  Sitting in chair, was ambulating with therapy in the hall HENT: No JVD, Membranes a little dry Cardiovascular: RRR, no MRG Respiratory: CTAB.  Nonlabored breathing on room air Abdomen: + bowel sounds, normoactive. No distention or tenderness.  Skin: C/D/I. No apparent lesions.  Dry dressing to surgical incision CDI   MSK:      No apparent deformity. + LUE sling for comfort      + TTP L posterior shoulder - no palpable deformity   Neurologic exam: Alert and awake follows commands, cranial nerves II through XII grossly intact, EOMI Sensation: To light touch intact in BL LE; LUE  diffusely reduced compared to LUE; absent digits 1-3     Coordination: No apparent tremors. No ataxia  Spasticity: MAS 0 in all extremities.       Strength:                RUE: 3-4/5 SA, 5-/5 EF, 5-/5 EE, 5-/5 WE, 5-/5 FF, 5-/5 FA                LUE:  1/5 SA, 2/5 EF, 3/5 EE, 4-/5 WE, 4/5 FF, 4/5 FA                RLE: 5/5 HF, 5/5 KE, 5/5  DF, 5/5  EHL, 5/5  PF                 LLE:  5/5 HF, 5/5 KE, 5/5  DF, 5/5  EHL, 5/5  PF  Prior neuro assessment is c/w today's exam 03/26/2024.   Assessment/Plan: 1. Functional deficits which require 3+ hours per day of interdisciplinary therapy in a comprehensive inpatient rehab setting. Physiatrist is providing close team supervision and 24 hour management of active medical problems listed below. Physiatrist and rehab team continue to assess barriers to discharge/monitor  patient progress toward functional and medical goals  Care Tool:  Bathing    Body parts bathed by patient: Chest, Abdomen, Right upper leg, Left upper leg, Right lower leg, Left lower leg, Face, Front perineal area   Body parts bathed by helper: Right arm, Left arm, Buttocks     Bathing assist Assist Level: Moderate Assistance - Patient 50 - 74%     Upper Body Dressing/Undressing Upper body dressing   What is the patient wearing?: Pull over shirt    Upper body assist Assist Level: Maximal Assistance - Patient 25 - 49%    Lower Body Dressing/Undressing Lower body dressing      What is the patient wearing?: Pants     Lower body assist Assist for lower body dressing: Moderate Assistance - Patient 50 - 74%     Toileting Toileting Toileting Activity did not occur (Clothing management and hygiene only): N/A (no void or bm)  Toileting assist Assist for toileting: Moderate Assistance - Patient 50 - 74%     Transfers Chair/bed transfer  Transfers assist     Chair/bed transfer assist level: Minimal Assistance - Patient > 75%      Locomotion Ambulation   Ambulation assist      Assist level: Contact Guard/Touching assist Assistive device: Walker-rolling Max distance: 128   Walk 10 feet activity   Assist     Assist level: Contact Guard/Touching assist Assistive device: Walker-rolling   Walk 50 feet activity   Assist    Assist level: Contact Guard/Touching assist Assistive device: Walker-rolling    Walk 150 feet activity   Assist    Assist level: Minimal Assistance - Patient > 75% Assistive device: Walker-rolling    Walk 10 feet on uneven surface  activity   Assist Walk 10 feet on uneven surfaces activity did not occur: Safety/medical concerns         Wheelchair     Assist Is the patient using a wheelchair?: No             Wheelchair 50 feet with 2 turns activity    Assist            Wheelchair 150 feet activity     Assist          Blood pressure 106/84, pulse 98, temperature 98.1 F (36.7 C), temperature source Oral, resp. rate 17, height 5' 8 (1.727 m), weight 108.3 kg, SpO2 98%.   Medical Problem List and Plan: 1. Functional deficits secondary to cervical myelopathy status post C3-7 PSF, C4-6 decompression 03/15/2024 with postoperative left upper extremity weakness as well as history of cervical laminectomy 01/07/2022 as well as 11/12/2021.  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  Antithrombotics: -DVT/anticoagulation:  Pharmaceutical: Lovenox  check vascular study              -  antiplatelet therapy: N/A 3. 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 Did fail pain management with oxycodone  IR prior to dilaudid .   4. Mood/Behavior/Sleep/ADHD: Ritalin  20 mg every morning, Effexor  75 mg daily, Atarax  50 mg nightly as needed sleep,              -antipsychotic agents: N/A              - added elavil  25 mg at bedtime  -6/27 patient uses hydroxyzine  for insomnia, if poor sleep continues to be an issue could consider trying a different agent   5. Neuropsych/cognition: This patient is capable of making decisions on her own behalf. 6. Skin/Wound Care: Routine skin checks 7. Fluids/Electrolytes/Nutrition: Routine in and outs with follow-up chemistries 8.  Hypertension.  Aldactone  100 mg twice daily, minoxidil  2.5 mg daily.  Monitor with increased mobility      03/26/2024    6:32 AM 03/25/2024    7:13 PM 03/25/2024    3:23 PM  Vitals with BMI  Systolic 106 141 879  Diastolic 84 70 74  Pulse 98 106 92     -6/23 decrease Aldactone  to 25 mg twice daily due to relative hypotension -6/24 will stop aldactone  and menoxidil- this was for hair growth -6/27 BP stable, continue current regimen and monitor   9.  Constipation.  Colace 100 mg twice daily, MiraLAX  daily as needed             -6/23 patient got sorbitol  today monitor for response, increase Senokot to 2 tabs  -6/24 1/2 container bowel prep, enema/suppository if no results-ordered PRN  -6/25 LBM yesterday, improved  -6/26 Schedule miralax  daily, continue Senokot  -6/27 LBM 6/24, she denies  feeling constipated, will add 2nd dose miralax  prn, consider additional medication if no bowel movement by tomorrow 10.  Diabetes mellitus.  Hemoglobin A1c 5.8.  Blood sugars 103-94-106-134.  Patient on Mounjaro prior to admission and resume at discharge. CBG (last 3)  No results for input(s): GLUCAP in the last 72 hours. Glucose stable on BMP 03/23/24 history 03/23/24   11.  Hyperlipidemia.  Lipitor 40 mg daily 12.  History of asthma.  Singulair  10 mg daily.  Check oxygen saturations every shift 13.  Class I obesity.  BMI 35.28.  Dietary follow-up  14. Hx b/l CTS surgery; LUE weakness s/p PSF. L digit 1-3 numbness on exam, recommend OP EMG/ncs on discharge.   15.  Leukocytosis             - 6/23 improved to 11.1 from 12.3.  Monitor for signs of infection  -6/25 WBC 12.5 yesterday, monitor for signs/symptoms of infection 16.  Mild tachycardia             - Continue to monitor trend for now, encourage oral fluids  -Ritalin  may be contributing- she says this was started for fibromyalgia 17. Orthostatic hypotension  -Encourage fluids, adjust medications as above  -  ABD binder/TEDs    03/26/2024    6:32 AM 03/25/2024    7:13 PM 03/25/2024    3:23 PM  Vitals with BMI  Systolic 106 141 879  Diastolic 84 70 74  Pulse 98 106 92    18. Possible neurogenic bladder  -PVR/Bladder scan-   -6/25-26 mildly elevated PVRs, not needing IC yet, discussed timed voids/double voids. Hold off on flomax due to hypotension. U/A does not indicate likely UTI  6/27 PVRs 89, 216, 0, consider discontinuing tomorrow if stable 19. Dry mouth  - Biotene mouthwash  -6/27 improved, continue to monitor 20. Dyspepsia  -Start PPI  -6/27 Discussed maalox PRN  LOS: 7 days A FACE TO FACE EVALUATION WAS PERFORMED  Murray Collier 03/26/2024, 10:07 AM

## 2024-03-27 NOTE — Progress Notes (Signed)
 PROGRESS NOTE   Subjective/Complaints:  Pt doing ok, but slept poorly d/t noise in the hallway/disruptions. Pain doing much better though. LBM yesterday. Urinating ok, still feels some incomplete emptying but not needing caths. No other complaints or concerns.   ROS: Patient denies fever,  nausea, vomiting, diarrhea,  shortness of breath or chest pain, headache, or mood change. + dizziness/double vision- intermittent + constipation- improved + heartburn- intermittent + insomnia  Objective:   No results found. No results for input(s): WBC, HGB, HCT, PLT in the last 72 hours.  No results for input(s): NA, K, CL, CO2, GLUCOSE, BUN, CREATININE, CALCIUM  in the last 72 hours.   Intake/Output Summary (Last 24 hours) at 03/27/2024 1121 Last data filed at 03/26/2024 1330 Gross per 24 hour  Intake 240 ml  Output --  Net 240 ml        Physical Exam: Vital Signs Blood pressure 102/75, pulse 88, temperature 98.2 F (36.8 C), temperature source Oral, resp. rate 18, height 5' 8 (1.727 m), weight 108.3 kg, SpO2 100%.  Physical Exam Constitutional: No apparent distress. Appropriate appearance for age.  Resting in bed. Collar doffed.  HENT: No JVD, MMM this morning Cardiovascular: RRR, no MRG Respiratory: CTAB.  Nonlabored breathing on room air Abdomen: + bowel sounds, normoactive. No distention or tenderness.  Skin: C/D/I. No apparent lesions.  Dry dressing to surgical incision CDI   PRIOR EXAMS: MSK:      No apparent deformity. + LUE sling for comfort      + TTP L posterior shoulder - no palpable deformity   Neurologic exam: Alert and awake follows commands, cranial nerves II through XII grossly intact, EOMI Sensation: To light touch intact in BL LE; LUE diffusely reduced compared to LUE; absent digits 1-3     Coordination: No apparent tremors. No ataxia  Spasticity: MAS 0 in all extremities.        Strength:                RUE: 3-4/5 SA, 5-/5 EF, 5-/5 EE, 5-/5 WE, 5-/5 FF, 5-/5 FA                LUE:  1/5 SA, 2/5 EF, 3/5 EE, 4-/5 WE, 4/5 FF, 4/5 FA                RLE: 5/5 HF, 5/5 KE, 5/5  DF, 5/5  EHL, 5/5  PF                 LLE:  5/5 HF, 5/5 KE, 5/5  DF, 5/5  EHL, 5/5  PF  Prior neuro assessment is c/w today's exam    Assessment/Plan: 1. Functional deficits which require 3+ hours per day of interdisciplinary therapy in a comprehensive inpatient rehab setting. Physiatrist is providing close team supervision and 24 hour management of active medical problems listed below. Physiatrist and rehab team continue to assess barriers to discharge/monitor patient progress toward functional and medical goals  Care Tool:  Bathing    Body parts bathed by patient: Chest, Abdomen, Right upper leg, Left upper leg, Right lower leg, Left lower leg, Face, Front perineal area   Body parts bathed by  helper: Right arm, Left arm, Buttocks     Bathing assist Assist Level: Moderate Assistance - Patient 50 - 74%     Upper Body Dressing/Undressing Upper body dressing   What is the patient wearing?: Pull over shirt    Upper body assist Assist Level: Maximal Assistance - Patient 25 - 49%    Lower Body Dressing/Undressing Lower body dressing      What is the patient wearing?: Pants     Lower body assist Assist for lower body dressing: Moderate Assistance - Patient 50 - 74%     Toileting Toileting Toileting Activity did not occur (Clothing management and hygiene only): N/A (no void or bm)  Toileting assist Assist for toileting: Moderate Assistance - Patient 50 - 74%     Transfers Chair/bed transfer  Transfers assist     Chair/bed transfer assist level: Minimal Assistance - Patient > 75%     Locomotion Ambulation   Ambulation assist      Assist level: Contact Guard/Touching assist Assistive device: Walker-rolling Max distance: 128   Walk 10 feet activity   Assist      Assist level: Contact Guard/Touching assist Assistive device: Walker-rolling   Walk 50 feet activity   Assist    Assist level: Contact Guard/Touching assist Assistive device: Walker-rolling    Walk 150 feet activity   Assist    Assist level: Minimal Assistance - Patient > 75% Assistive device: Walker-rolling    Walk 10 feet on uneven surface  activity   Assist Walk 10 feet on uneven surfaces activity did not occur: Safety/medical concerns         Wheelchair     Assist Is the patient using a wheelchair?: No             Wheelchair 50 feet with 2 turns activity    Assist            Wheelchair 150 feet activity     Assist          Blood pressure 102/75, pulse 88, temperature 98.2 F (36.8 C), temperature source Oral, resp. rate 18, height 5' 8 (1.727 m), weight 108.3 kg, SpO2 100%.   Medical Problem List and Plan: 1. Functional deficits secondary to cervical myelopathy status post C3-7 PSF, C4-6 decompression 03/15/2024 with postoperative left upper extremity weakness as well as history of cervical laminectomy 01/07/2022 as well as 11/12/2021.  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  Antithrombotics: -DVT/anticoagulation:  Pharmaceutical: Lovenox  check vascular study             -antiplatelet therapy: N/A 3. 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 Did fail pain management with oxycodone  IR prior to dilaudid .  -03/20/24 pain management still an issue, mostly related to doses not coming on time -changed scheduled oxy 5mg  IR to 0600/1300/2100 to have it on off-hours but related to PT schedule and sleep schedule.  -ordered Kpad -Lidoderm  patches for L shoulder -call on call provider if pain control issues-- might change oxy to SR form -03/27/24 pain doing much better overall 4. Mood/Behavior/Sleep/ADHD: Ritalin  20 mg every morning, Effexor  75 mg daily, Atarax  50 mg nightly as needed sleep,              -antipsychotic agents: N/A              - added elavil  25 mg at bedtime -03/20/24 added melatonin 5mg  nightly-- home med too -6/27 patient uses hydroxyzine  for insomnia, if poor sleep continues to be an issue could consider trying a different agent   5. Neuropsych/cognition: This patient is capable of making decisions on her own behalf. 6. Skin/Wound Care: Routine skin checks 7. Fluids/Electrolytes/Nutrition: Routine in and outs with follow-up chemistries 8.  Hypertension.  Aldactone  100 mg twice daily, minoxidil  2.5 mg daily.  Monitor with increased mobility -6/23 decrease Aldactone  to 25 mg twice daily due to relative hypotension -6/24 will stop aldactone  and menoxidil- this was for hair growth -6/27 BP stable, continue current regimen and monitor -03/27/24 BP elevated last night and day before, but otherwise stable in between; monitor for now.  Vitals:   03/23/24 1615 03/23/24 1944 03/24/24 0642 03/24/24 1324  BP: 118/77 110/80 113/68 117/77   03/24/24 2118 03/25/24 0548 03/25/24 1523 03/25/24 1913  BP: 116/74 118/66 120/74 (!) 141/70   03/26/24 0632  03/26/24 1559 03/26/24 2154 03/27/24 0613  BP: 106/84 129/87 (!) 143/83 102/75      9.  Constipation.  Colace 100 mg twice daily, MiraLAX  daily as needed -03/20/24 no BM in 2 days per pt, but doesn't want to adjust meds yet-- if no BM by tomorrow, adjust further -03/21/24 still no BM, increase colace 200mg  BID, add psyllium per pt preference; monitor, if no BM tomorrow, would do sorbitol  -6/23 patient got sorbitol  today monitor for response, increase Senokot to 2 tabs  -6/24 1/2 container bowel prep, enema/suppository if no results-ordered PRN  -6/25 LBM yesterday, improved  -6/26 Schedule miralax  daily, continue Senokot -6/27 LBM 6/24, she denies feeling constipated, will add 2nd dose miralax  prn, consider additional medication if no bowel movement by tomorrow -03/27/24 LBM yesterday, monitor 10.  Diabetes mellitus.  Hemoglobin A1c 5.8.  Blood sugars 103-94-106-134.  Patient on Mounjaro prior to admission and resume at discharge. No need for SSI/CBG checks.  Glucose stable on BMP 03/23/24 history 03/23/24   11.  Hyperlipidemia.  Lipitor 40 mg daily 12.  History of asthma.  Singulair  10 mg daily.  Claritin  10mg  daily. Flonase  2 sprays nightly. Check oxygen saturations every shift 13.  Class I obesity.  BMI 35.28.  Dietary follow-up  14. Hx b/l CTS surgery; LUE weakness s/p PSF. L digit 1-3 numbness on exam, recommend OP EMG/ncs on discharge. 15. Vit D Deficiency: vit D supp 50k U weekly 16. ABL Anemia: Hgb 10.1 on admission, monitor weekly -- stable 17.  Leukocytosis: likely from steroid, monitor weekly, monitor for s/sx of  infection             - 6/23 improved to 11.1 from 12.3.  Monitor for signs of infection  -6/25 WBC 12.5 yesterday, monitor for signs/symptoms of infection 18.  Mild tachycardia             - Continue to monitor trend for now, encourage oral fluids  -Ritalin  may be contributing- she says this was started for fibromyalgia 19. Orthostatic hypotension  -Encourage fluids,  adjust medications as above  -ABD binder/TEDs  -see above #8  20. Possible neurogenic bladder  -PVR/Bladder scan-  -6/25-26 mildly elevated PVRs, not needing IC yet, discussed timed voids/double voids. Hold off on flomax due to hypotension. U/A does not indicate likely UTI  6/27 PVRs 89, 216, 0, consider discontinuing tomorrow if stable 21. Dry mouth  - Biotene mouthwash  -6/27 improved, continue to monitor 22. Dyspepsia  -Start PPI  -6/27 Discussed maalox PRN  LOS: 8 days A FACE TO FACE EVALUATION WAS PERFORMED  [address] 03/27/2024, 11:21 AM

## 2024-03-28 DIAGNOSIS — R3915 Urgency of urination: Secondary | ICD-10-CM

## 2024-03-28 LAB — URINALYSIS, COMPLETE (UACMP) WITH MICROSCOPIC
Bilirubin Urine: NEGATIVE
Glucose, UA: NEGATIVE mg/dL
Hgb urine dipstick: NEGATIVE
Ketones, ur: NEGATIVE mg/dL
Leukocytes,Ua: NEGATIVE
Nitrite: NEGATIVE
Protein, ur: NEGATIVE mg/dL
Specific Gravity, Urine: 1.011 (ref 1.005–1.030)
pH: 6 (ref 5.0–8.0)

## 2024-03-28 NOTE — Plan of Care (Addendum)
 upgraded goals to mod I 2/2 to pt progressing quicker than anticipited and inconsistent caregiver support upon discharge.  Problem: RH Balance Goal: LTG Patient will maintain dynamic standing balance (PT) Description: LTG:  Patient will maintain dynamic standing balance with assistance during mobility activities (PT) Flowsheets (Taken 03/28/2024 0947) LTG: Pt will maintain dynamic standing balance during mobility activities with:: (upgraded 2/2 to pt progressing quicker than anticipited and inconsistent caregiver support upon discharge.) Independent with assistive device    Problem: Sit to Stand Goal: LTG:  Patient will perform sit to stand with assistance level (PT) Description: LTG:  Patient will perform sit to stand with assistance level (PT) Flowsheets (Taken 03/28/2024 0947) LTG: PT will perform sit to stand in preparation for functional mobility with assistance level: (upgraded 2/2 to pt progressing quicker than anticipited and inconsistent caregiver support upon discharge.) Independent with assistive device   Problem: RH Bed to Chair Transfers Goal: LTG Patient will perform bed/chair transfers w/assist (PT) Description: LTG: Patient will perform bed to chair transfers with assistance (PT). Flowsheets (Taken 03/28/2024 0947) LTG: Pt will perform Bed to Chair Transfers with assistance level: (upgraded 2/2 to pt progressing quicker than anticipited and inconsistent caregiver support upon discharge.) Independent with assistive device    Problem: RH Ambulation Goal: LTG Patient will ambulate in controlled environment (PT) Description: LTG: Patient will ambulate in a controlled environment, # of feet with assistance (PT). Flowsheets Taken 03/28/2024 9052 by Consuela Lob, PT LTG: Pt will ambulate in controlled environ  assist needed:: (upgraded 2/2 to pt progressing quicker than anticipited and inconsistent caregiver support upon discharge.) Independent with assistive device Taken 03/20/2024  1624 by Merilee Lynwood MATSU, PT LTG: Ambulation distance in controlled environment: ambulate 150 feet with LRAD Goal: LTG Patient will ambulate in home environment (PT) Description: LTG: Patient will ambulate in home environment, # of feet with assistance (PT). Flowsheets Taken 03/28/2024 9052 by Consuela Lob, PT LTG: Pt will ambulate in home environ  assist needed:: (upgraded 2/2 to pt progressing quicker than anticipited and inconsistent caregiver support upon discharge.) Independent with assistive device Taken 03/20/2024 1624 by Merilee Lynwood MATSU, PT LTG: Ambulation distance in home environment: 50 feet with LRAD   Problem: RH Stairs Goal: LTG Patient will ambulate up and down stairs w/assist (PT) Description: LTG: Patient will ambulate up and down # of stairs with assistance (PT) Flowsheets (Taken 03/28/2024 0947) LTG: Pt will ambulate up/down stairs assist needed:: (upgraded 2/2 to pt progressing quicker than anticipited and inconsistent caregiver support upon discharge.) Independent with assistive device LTG: Pt will  ambulate up and down number of stairs: (upgraded to reflect pt home set up) 15 steps with LRAD with 2 rails for access to bedroom   Problem: Sit to Stand Goal: LTG:  Patient will perform sit to stand with assistance level (PT) Description: LTG:  Patient will perform sit to stand with assistance level (PT) Flowsheets (Taken 03/28/2024 0947) LTG: PT will perform sit to stand in preparation for functional mobility with assistance level: (upgraded 2/2 to pt progressing quicker than anticipited and inconsistent caregiver support upon discharge.) Independent with assistive device

## 2024-03-28 NOTE — Progress Notes (Signed)
 Physical Therapy Weekly Progress Note  Patient Details  Name: Eileen Chaney MRN: [id number] Date of Birth: [birth year]  Beginning of progress report period: March 20, 2024 End of progress report period: March 28, 2024  Today's Date: 03/28/2024 PT Individual Time: 0916-1000 PT Individual Time Calculation (min): 44 min   Patient has met 5 of 5 short term goals.  Pt is performing bed mobility on apartment bed with supervision, sit to stand, stand pivot transfer with rollator and supervision gait x 208 feet with rollator and supervision, stair navigation x16 6 inch steps with B HR and close supervision/CGA. D/C scheduled for 7/10. Upgraded goals to mod I/supervision 2/2 to decreased cargiver support upon discharge and pt progressing quicker than anticipated.  Patient continues to demonstrate the following deficits muscle weakness, decreased cardiorespiratoy endurance, decreased coordination, and decreased standing balance, decreased balance strategies, and difficulty maintaining precautions and therefore will continue to benefit from skilled PT intervention to increase functional independence with mobility.  Patient progressing toward long term goals..  Continue plan of care.  PT Short Term Goals Week 1:  PT Short Term Goal 1 (Week 1): Pt will increase bed mobility to contact gaurd. PT Short Term Goal 1 - Progress (Week 1): Met PT Short Term Goal 2 (Week 1): Pt will increase transfers to contact gaurd. PT Short Term Goal 2 - Progress (Week 1): Met PT Short Term Goal 3 (Week 1): Pt will ambulate 159 feet with LRAD and contact guard PT Short Term Goal 3 - Progress (Week 1): Met PT Short Term Goal 4 (Week 1): Pt will ascend/descend 4 stairs with 1 rail and min A. PT Short Term Goal 4 - Progress (Week 1): Met PT Short Term Goal 5 (Week 1): Pt will increase standing balance to contact gaurd PT Short Term Goal 5 - Progress (Week 1): Met Week 2:  PT Short Term Goal 1 (Week 2): Pt will navigate 12  steps with unilateral handrial and CGA to access bedroom PT Short Term Goal 2 (Week 2): pt will navigate step for entry into car PT Short Term Goal 3 (Week 2): pt will provide teach back and implementation of energy conservation techniques and safety precautions  Skilled Therapeutic Interventions/Progress Updates:      Pt supine in bed upon arrival. Pt agreeable to therapy. Pt reports 5/10 neck and L shoulder and low back. Premedicated. Therapist provided rest breaks and repositioning. Pt upset that she slept past her alarm, and perseverating on it thorughout session.   Pt demonstrates overall impulsivity at start of session, attempting to get up by self with no AD and in a hurry 2/2 to waking up past alarm .   Therapist provided extensive education regarding need for assistance at this time, as well as an AD when up for pt overall safety and to reduce fall risk. , as well as energy conservation techniques with emphasis on taking time and seated rest breaks.   Pt ambulated throughout room to gather sweatshirt Verbal cues provided to donn while seated. Verbal cues provided to use rollator with ambulation in room even in with short distances. Recommended pt have partner clear a pathway for commonly trafficked areas in home, as pt unsafely attempting to navigated crowded spaces in room from DME without cues.   Pt performed ambualtory transfer to apartment bed with rollator and supervision, verbal cues provided for locking brakes. Pt performed bed mobility with supervision, verbal cues provided for log roll technique.   Pt navigated 16 6 inch steps  with B handrails and CGA/close supervision, with reciprocal gait for ascending and step to gait for descending. Therapist recommended pt utilize rollator on first floor and purchase RW OOP for navigation upstairs.   Pt ambulated 208 feet with rollator and supervision. Pt demonstrates improved safety with use of rollator.       Therapy  Documentation Precautions:  Precautions Precautions: Cervical, Fall Required Braces or Orthoses: Cervical Brace Cervical Brace: For comfort Restrictions Weight Bearing Restrictions Per Provider Order: No  Therapy/Group: Individual Therapy  [facility] [name], [place], DPT  03/28/2024, 7:57 AM

## 2024-03-28 NOTE — Progress Notes (Signed)
 Occupational Therapy Session Note  Patient Details  Name: Eileen Chaney MRN: [id number] Date of Birth: March 05, 1976  Today's Date: 03/28/2024 OT Individual Time: 0100-0200 OT Individual Time Calculation (min): 60 min    Short Term Goals: Week 1:  OT Short Term Goal 1 (Week 1): Patient will direct position of left arm for comfort during self care activity OT Short Term Goal 2 (Week 1): Patient will utilize left hand to wash thighs, right forearm with min assist OT Short Term Goal 3 (Week 1): Patient will  complete toilet trasnfer with close supervision OT Short Term Goal 4 (Week 1): Patient will complete toileting with mod assist  Skilled Therapeutic Interventions/Progress Updates:    Patient seated at w/c LOF at the time of arrival.  Patient indicated that she was met with challenge in relation to sleeping on last night. The pt went on to say that she hand no pain to report at the time of treatment. The pt  began the session with demonstration of simplifying UB dressing in relation to donning a bra or the type of bra that would be easier for her regarding her shld pain.  The and I agreed on a front closer bra since that challenges for the pt is shld rotation oppose to manipulation or coordination of bilateral hands. The pt went on to complete a simulated task in donning theraband using the reacher to increase ROM for donning the theraband over the shld's  and putting it over head and bring it on to the waist 3x.  The pt was able to demonstrate good carryover following demonstration and initial cues.  The pt went on to complete UB cycle while seated with her back supported and her soft collar in place. At the end of the session, the patient remained at w/c LOF with the call light and bedside table within reach and all additional needs addressed.   Therapy Documentation Precautions:  Precautions Precautions: Cervical, Fall Required Braces or Orthoses: Cervical Brace Cervical Brace: For  comfort Restrictions Weight Bearing Restrictions Per Provider Order: No  Therapy/Group: Individual Therapy  Elvera JONETTA Mace 03/28/2024, 4:54 PM

## 2024-03-28 NOTE — Progress Notes (Signed)
 PROGRESS NOTE   Subjective/Complaints:  Pt doing better today, slept better. Pain well managed. LBM yesterday. Urinating ok, still feels some hesitancy and urgency so would like to repeat U/A. Hasn't needed caths. No other complaints or concerns.   ROS: Patient denies fever,  nausea, vomiting, diarrhea,  shortness of breath or chest pain, headache, or mood change. + dizziness/double vision- intermittent + constipation- improved + heartburn- intermittent + insomnia +urinary urgency/hesitancy  Objective:   No results found. No results for input(s): WBC, HGB, HCT, PLT in the last 72 hours.  No results for input(s): NA, K, CL, CO2, GLUCOSE, BUN, CREATININE, CALCIUM  in the last 72 hours.   Intake/Output Summary (Last 24 hours) at 03/28/2024 0856 Last data filed at 03/27/2024 1200 Gross per 24 hour  Intake 230 ml  Output --  Net 230 ml        Physical Exam: Vital Signs Blood pressure 112/65, pulse 88, temperature 97.6 F (36.4 C), temperature source Oral, resp. rate 18, height 5' 8 (1.727 m), weight 108.3 kg, SpO2 97%.  Physical Exam Constitutional: No apparent distress. Appropriate appearance for age.  Resting in bed. Collar doffed but sleep neck pillow on.  HENT: No JVD, MMM this morning Cardiovascular: RRR, no MRG Respiratory: CTAB.  Nonlabored breathing on room air Abdomen: + bowel sounds, normoactive. No distention or tenderness.  Skin: C/D/I. No apparent lesions.  Dry dressing to surgical incision CDI   PRIOR EXAMS: MSK:      No apparent deformity. + LUE sling for comfort      + TTP L posterior shoulder - no palpable deformity   Neurologic exam: Alert and awake follows commands, cranial nerves II through XII grossly intact, EOMI Sensation: To light touch intact in BL LE; LUE diffusely reduced compared to LUE; absent digits 1-3     Coordination: No apparent tremors. No ataxia   Spasticity: MAS 0 in all extremities.       Strength:                RUE: 3-4/5 SA, 5-/5 EF, 5-/5 EE, 5-/5 WE, 5-/5 FF, 5-/5 FA                LUE:  1/5 SA, 2/5 EF, 3/5 EE, 4-/5 WE, 4/5 FF, 4/5 FA                RLE: 5/5 HF, 5/5 KE, 5/5  DF, 5/5  EHL, 5/5  PF                 LLE:  5/5 HF, 5/5 KE, 5/5  DF, 5/5  EHL, 5/5  PF  Prior neuro assessment is c/w today's exam    Assessment/Plan: 1. Functional deficits which require 3+ hours per day of interdisciplinary therapy in a comprehensive inpatient rehab setting. Physiatrist is providing close team supervision and 24 hour management of active medical problems listed below. Physiatrist and rehab team continue to assess barriers to discharge/monitor patient progress toward functional and medical goals  Care Tool:  Bathing    Body parts bathed by patient: Chest, Abdomen, Right upper leg, Left upper leg, Right lower leg, Left lower leg, Face, Front perineal area  Body parts bathed by helper: Right arm, Left arm, Buttocks     Bathing assist Assist Level: Moderate Assistance - Patient 50 - 74%     Upper Body Dressing/Undressing Upper body dressing   What is the patient wearing?: Pull over shirt    Upper body assist Assist Level: Maximal Assistance - Patient 25 - 49%    Lower Body Dressing/Undressing Lower body dressing      What is the patient wearing?: Pants     Lower body assist Assist for lower body dressing: Moderate Assistance - Patient 50 - 74%     Toileting Toileting Toileting Activity did not occur (Clothing management and hygiene only): N/A (no void or bm)  Toileting assist Assist for toileting: Moderate Assistance - Patient 50 - 74%     Transfers Chair/bed transfer  Transfers assist     Chair/bed transfer assist level: Minimal Assistance - Patient > 75%     Locomotion Ambulation   Ambulation assist      Assist level: Contact Guard/Touching assist Assistive device: Walker-rolling Max distance: 128    Walk 10 feet activity   Assist     Assist level: Contact Guard/Touching assist Assistive device: Walker-rolling   Walk 50 feet activity   Assist    Assist level: Contact Guard/Touching assist Assistive device: Walker-rolling    Walk 150 feet activity   Assist    Assist level: Minimal Assistance - Patient > 75% Assistive device: Walker-rolling    Walk 10 feet on uneven surface  activity   Assist Walk 10 feet on uneven surfaces activity did not occur: Safety/medical concerns         Wheelchair     Assist Is the patient using a wheelchair?: No             Wheelchair 50 feet with 2 turns activity    Assist            Wheelchair 150 feet activity     Assist          Blood pressure 112/65, pulse 88, temperature 97.6 F (36.4 C), temperature source Oral, resp. rate 18, height 5' 8 (1.727 m), weight 108.3 kg, SpO2 97%.   Medical Problem List and Plan: 1. Functional deficits secondary to cervical myelopathy status post C3-7 PSF, C4-6 decompression 03/15/2024 with postoperative left upper extremity weakness as well as history of cervical laminectomy 01/07/2022 as well as 11/12/2021.  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  Antithrombotics: -DVT/anticoagulation:  Pharmaceutical: Lovenox  40mg  daily, vascular study neg 03/21/24             -antiplatelet therapy: N/A 3. 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 Did fail pain management with oxycodone  IR prior to dilaudid .  -03/20/24 pain management still an issue, mostly related to doses not coming on time -changed scheduled oxy 5mg  IR to 0600/1300/2100 to have it on off-hours but related to PT schedule and sleep schedule.  -ordered Kpad -Lidoderm  patches for L shoulder -call on call provider if pain control issues-- might change oxy to SR form -6/28-29/25 pain doing much better overall 4. Mood/Behavior/Sleep/ADHD: Ritalin  20 mg every morning, Effexor  75 mg daily, Atarax  50 mg nightly as needed sleep,              -antipsychotic agents: N/A              - added elavil  25 mg at bedtime -03/20/24 added melatonin 5mg  nightly-- home med too -6/27 patient uses hydroxyzine  for insomnia, if poor sleep continues to be an issue could consider trying a different agent   5. Neuropsych/cognition: This patient is capable of making decisions on her own behalf. 6. Skin/Wound Care: Routine skin checks 7. Fluids/Electrolytes/Nutrition: Routine in and outs with follow-up chemistries 8.  Hypertension.  Aldactone  100 mg twice daily, minoxidil  2.5 mg daily.  Monitor with increased mobility -6/23 decrease Aldactone  to 25 mg twice daily due to relative hypotension -6/24 will stop aldactone  and menoxidil- this was for hair growth -6/27 BP stable, continue current regimen and monitor -03/27/24 BP elevated last night and day before, but otherwise stable in between; monitor for now.  -03/28/24 BPs better; monitor Vitals:   03/24/24 1324 03/24/24 2118 03/25/24 0548 03/25/24 1523  BP: 117/77 116/74 118/66 120/74   03/25/24 1913 03/26/24 0632  03/26/24 1559 03/26/24 2154  BP: (!) 141/70 106/84 129/87 (!) 143/83   03/27/24 0613 03/27/24 1315 03/27/24 2205 03/28/24 0532  BP: 102/75 102/68 110/73 112/65      9.  Constipation.  Colace 100 mg twice daily, MiraLAX  daily as needed -03/20/24 no BM in 2 days per pt, but doesn't want to adjust meds yet-- if no BM by tomorrow, adjust further -03/21/24 still no BM, increase colace 200mg  BID, add psyllium per pt preference; monitor, if no BM tomorrow, would do sorbitol  -6/23 patient got sorbitol  today monitor for response, increase Senokot to 2 tabs  -6/24 1/2 container bowel prep, enema/suppository if no results-ordered PRN  -6/25 LBM yesterday, improved  -6/26 Schedule miralax  daily, continue Senokot -6/27 LBM 6/24, she denies feeling constipated, will add 2nd dose miralax  prn, consider additional medication if no bowel movement by tomorrow -03/28/24 LBM yesterday, monitor 10.  Diabetes mellitus.  Hemoglobin A1c 5.8.  Blood sugars 103-94-106-134.  Patient on Mounjaro prior to admission and resume at discharge. No need for SSI/CBG checks.  Glucose stable on BMP 03/23/24    11.  Hyperlipidemia.  Lipitor 40 mg daily 12.  History of asthma.  Singulair  10 mg daily.  Claritin  10mg  daily. Flonase  2 sprays nightly. Check oxygen saturations every shift 13.  Class I obesity.  BMI 35.28.  Dietary follow-up  14. Hx b/l CTS surgery; LUE weakness s/p PSF. L digit 1-3 numbness on exam, recommend OP EMG/ncs on discharge. 15. Vit D Deficiency: vit D supp 50k U weekly 16. ABL Anemia: Hgb 10.1 on admission, monitor weekly -- stable 17.  Leukocytosis: likely from steroid, monitor weekly,  monitor for s/sx of infection             - 6/23 improved to 11.1 from 12.3.  Monitor for signs of infection  -6/25 WBC 12.5 yesterday, monitor for signs/symptoms of infection 18.  Mild tachycardia             - Continue to monitor trend for now, encourage oral fluids  -Ritalin  may be contributing- she says this was started for  fibromyalgia 19. Orthostatic hypotension  -Encourage fluids, adjust medications as above  -ABD binder/TEDs  -see above #8  20. Possible neurogenic bladder  -PVR/Bladder scan-  -6/25-26 mildly elevated PVRs, not needing IC yet, discussed timed voids/double voids. Hold off on flomax due to hypotension. U/A does not indicate likely UTI  6/27 PVRs 89, 216, 0, consider discontinuing tomorrow if stable -03/28/24 PVRs 0 since yesterday morning, will d/c these, but pt with hesitancy and urgency still, would like U/A rechecked-- ordered for today. F/up as available.  21. Dry mouth  - Biotene mouthwash  -6/27 improved, continue to monitor 22. Dyspepsia  -Start protonix  40mg  daily  -6/27 Discussed maalox PRN  LOS: 9 days A FACE TO FACE EVALUATION WAS PERFORMED  [address] 03/28/2024, 8:56 AM

## 2024-03-28 NOTE — Progress Notes (Incomplete)
 Occupational Therapy Session Note  Patient Details  Name: Eileen Chaney MRN: [id number] Date of Birth: March 13, 1976  {CHL IP REHAB OT TIME CALCULATIONS:[id number]}   Short Term Goals: Week 1:  OT Short Term Goal 1 (Week 1): Patient will direct position of left arm for comfort during self care activity OT Short Term Goal 2 (Week 1): Patient will utilize left hand to wash thighs, right forearm with min assist OT Short Term Goal 3 (Week 1): Patient will  complete toilet trasnfer with close supervision OT Short Term Goal 4 (Week 1): Patient will complete toileting with mod assist  Skilled Therapeutic Interventions/Progress Updates:    Patient agreeable to participate in OT session. Reports *** pain level.   Patient participated in skilled OT session focusing on ***. Therapist facilitated/assessed/developed/educated/integrated/elicited *** in order to improve/facilitate/promote    Therapy Documentation Precautions:  Precautions Precautions: Cervical, Fall Required Braces or Orthoses: Cervical Brace Cervical Brace: For comfort Restrictions Weight Bearing Restrictions Per Provider Order: No   Therapy/Group: Individual Therapy  Leita Howell, OTR/L,CBIS  Supplemental OT - [facility] and [facility] Secure Chat Preferred   03/28/2024, 10:27 PM

## 2024-03-29 ENCOUNTER — Encounter: Admitting: Orthopedic Surgery

## 2024-03-29 DIAGNOSIS — Z981 Arthrodesis status: Secondary | ICD-10-CM

## 2024-03-29 DIAGNOSIS — G959 Disease of spinal cord, unspecified: Secondary | ICD-10-CM

## 2024-03-29 LAB — CBC WITH DIFFERENTIAL/PLATELET
Abs Immature Granulocytes: 0.13 10*3/uL — ABNORMAL HIGH (ref 0.00–0.07)
Basophils Absolute: 0.1 10*3/uL (ref 0.0–0.1)
Basophils Relative: 1 %
Eosinophils Absolute: 0.6 10*3/uL — ABNORMAL HIGH (ref 0.0–0.5)
Eosinophils Relative: 6 %
HCT: 30.2 % — ABNORMAL LOW (ref 36.0–46.0)
Hemoglobin: 9.4 g/dL — ABNORMAL LOW (ref 12.0–15.0)
Immature Granulocytes: 1 %
Lymphocytes Relative: 31 %
Lymphs Abs: 2.9 10*3/uL (ref 0.7–4.0)
MCH: 27.6 pg (ref 26.0–34.0)
MCHC: 31.1 g/dL (ref 30.0–36.0)
MCV: 88.8 fL (ref 80.0–100.0)
Monocytes Absolute: 0.7 10*3/uL (ref 0.1–1.0)
Monocytes Relative: 7 %
Neutro Abs: 5 10*3/uL (ref 1.7–7.7)
Neutrophils Relative %: 54 %
Platelets: 324 10*3/uL (ref 150–400)
RBC: 3.4 MIL/uL — ABNORMAL LOW (ref 3.87–5.11)
RDW: 14.5 % (ref 11.5–15.5)
WBC: 9.4 10*3/uL (ref 4.0–10.5)
nRBC: 0 % (ref 0.0–0.2)

## 2024-03-29 LAB — BASIC METABOLIC PANEL WITH GFR
Anion gap: 9 (ref 5–15)
BUN: 6 mg/dL (ref 6–20)
CO2: 21 mmol/L — ABNORMAL LOW (ref 22–32)
Calcium: 8.4 mg/dL — ABNORMAL LOW (ref 8.9–10.3)
Chloride: 106 mmol/L (ref 98–111)
Creatinine, Ser: 0.67 mg/dL (ref 0.44–1.00)
GFR, Estimated: >60 mL/min (ref >60)
Glucose, Bld: 149 mg/dL — ABNORMAL HIGH (ref 70–99)
Potassium: 3.8 mmol/L (ref 3.5–5.1)
Sodium: 136 mmol/L (ref 135–145)

## 2024-03-29 MED ORDER — HYDROMORPHONE HCL 2 MG PO TABS
1.0000 mg | ORAL_TABLET | ORAL | Status: DC | PRN
  Administered 2024-03-29 – 2024-03-30 (×4): 2 mg via ORAL
  Filled 2024-03-29 (×4): qty 1

## 2024-03-29 NOTE — Progress Notes (Signed)
 PROGRESS NOTE   Subjective/Complaints: No events overnight. Labs and vital stable overnight.  Patient expresses concerns about continued use of pain medication, states that it makes her groggy and that she does not want to be on long-term pain medication as outpatient.  Discussed, patient agreeable to reduction of Dilaudid  today and starting to wean opiates first.  ROS: Patient denies fever,  nausea, vomiting, diarrhea,  shortness of breath or chest pain, headache, or mood change. + dizziness/double vision-no complaints today + constipation-no complaints today + heartburn- i no complaints today + insomnia-ongoing, due to shoulder pain +urinary urgency/hesitancy-no complaints today  Objective:   No results found. Recent Labs    03/29/24 0505  WBC 9.4  HGB 9.4*  HCT 30.2*  PLT 324    Recent Labs    03/29/24 0505  NA 136  K 3.8  CL 106  CO2 21*  GLUCOSE 149*  BUN 6  CREATININE 0.67  CALCIUM  8.4*     Intake/Output Summary (Last 24 hours) at 03/29/2024 0750 Last data filed at 03/28/2024 1300 Gross per 24 hour  Intake 240 ml  Output --  Net 240 ml        Physical Exam: Vital Signs Blood pressure 107/74, pulse 86, temperature (!) 97.5 F (36.4 C), temperature source Oral, resp. rate 18, height 5' 8 (1.727 m), weight 108.3 kg, SpO2 100%.  Physical Exam Constitutional: No apparent distress. Appropriate appearance for age.  Reclining in bed. HENT: No JVD, MMM this morning Cardiovascular: RRR, no MRG Respiratory: CTAB.  Nonlabored breathing on room air Abdomen: + bowel sounds, normoactive. No distention or tenderness.  Skin: C/D/I. No apparent lesions.  Surgical incision dry, intact.   MSK:      No apparent deformity. + LUE sling for comfort      + TTP L posterior shoulder - no palpable deformity-unchanged   Neurologic exam: Alert and awake follows commands, cranial nerves II through XII grossly intact,  EOMI Sensation: To light touch intact in BL LE; LUE diffusely reduced compared to LUE; absent digits 1-3     Coordination: No apparent tremors. No ataxia  Spasticity: MAS 0 in all extremities.  Strength:                RUE: 3-4/5 SA, 5-/5 EF, 5-/5 EE, 5-/5 WE, 5-/5 FF, 5-/5 FA                LUE:  1/5 SA, 2/5 EF, 3/5 EE, 4-/5 WE, 4/5 FF, 4/5 FA                RLE: 5/5 HF, 5/5 KE, 5/5  DF, 5/5  EHL, 5/5  PF                 LLE:  5/5 HF, 5/5 KE, 5/5  DF, 5/5  EHL, 5/5  PF  Physical exam unchanged from the above on reexamination 03/29/24    Assessment/Plan: 1. Functional deficits which require 3+ hours per day of interdisciplinary therapy in a comprehensive inpatient rehab setting. Physiatrist is providing close team supervision and 24 hour management of active medical problems listed below. Physiatrist and rehab team continue to assess barriers to  discharge/monitor patient progress toward functional and medical goals  Care Tool:  Bathing    Body parts bathed by patient: Chest, Abdomen, Right upper leg, Left upper leg, Right lower leg, Left lower leg, Face, Front perineal area   Body parts bathed by helper: Right arm, Left arm, Buttocks     Bathing assist Assist Level: Moderate Assistance - Patient 50 - 74%     Upper Body Dressing/Undressing Upper body dressing   What is the patient wearing?: Pull over shirt    Upper body assist Assist Level: Maximal Assistance - Patient 25 - 49%    Lower Body Dressing/Undressing Lower body dressing      What is the patient wearing?: Pants     Lower body assist Assist for lower body dressing: Moderate Assistance - Patient 50 - 74%     Toileting Toileting Toileting Activity did not occur (Clothing management and hygiene only): N/A (no void or bm)  Toileting assist Assist for toileting: Moderate Assistance - Patient 50 - 74%     Transfers Chair/bed transfer  Transfers assist     Chair/bed transfer assist level: Supervision/Verbal  cueing     Locomotion Ambulation   Ambulation assist      Assist level: Supervision/Verbal cueing Assistive device: Rollator Max distance: 208   Walk 10 feet activity   Assist     Assist level: Supervision/Verbal cueing Assistive device: Rollator   Walk 50 feet activity   Assist    Assist level: Supervision/Verbal cueing Assistive device: Rollator    Walk 150 feet activity   Assist    Assist level: Supervision/Verbal cueing Assistive device: Rollator    Walk 10 feet on uneven surface  activity   Assist Walk 10 feet on uneven surfaces activity did not occur: Safety/medical concerns         Wheelchair     Assist Is the patient using a wheelchair?: No             Wheelchair 50 feet with 2 turns activity    Assist            Wheelchair 150 feet activity     Assist          Blood pressure 107/74, pulse 86, temperature (!) 97.5 F (36.4 C), temperature source Oral, resp. rate 18, height 5' 8 (1.727 m), weight 108.3 kg, SpO2 100%.   Medical Problem List and Plan: 1. Functional deficits secondary to cervical myelopathy status post C3-7 PSF, C4-6 decompression 03/15/2024 with postoperative left upper extremity weakness as well as history of cervical laminectomy 01/07/2022 as well as 11/12/2021.  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  Antithrombotics: -DVT/anticoagulation:  Pharmaceutical: Lovenox  40mg  daily, vascular study neg 03/21/24             -antiplatelet therapy:  N/A 3. Pain Management: Lyrica  150 mg 3 times daily, Dilaudid  2 mg every 4 hours as moderate  pain needed and 4 mg every 4 hours as needed severe pain, Robaxin  1000 mg every 6 hours, Valium  as needed muscle spasms              - Patient with slight allergy to dilaudid , has benadryll 35 mg Q6H PRN  - Poorly controlled L posteiro shoulder neuropathic pain - added elavil  25 mg at bedtime for pain/sleep + oxycodone  5 mg TID (no allergy prior, scheduled before therapies/bed). Did fail pain management with oxycodone  IR prior to dilaudid .  -03/20/24 pain management still an issue, mostly related to doses not coming on time -changed scheduled oxy 5mg  IR to 0600/1300/2100 to have it on off-hours but related to PT schedule and sleep schedule.  -ordered Kpad -Lidoderm  patches for L shoulder -call on call provider if pain control issues-- might change oxy to SR form -6/28-29/25 pain doing much better overall 6-30: Reduce Dilaudid  from 2 to 4 mg every 6 hours as needed to 1 to 2 mg every 6 hours as needed, per patient request to start weaning pain medication  4. Mood/Behavior/Sleep/ADHD: Ritalin  20 mg every morning, Effexor  75 mg daily, Atarax  50 mg nightly as needed sleep,              -antipsychotic agents: N/A              - added elavil  25 mg at bedtime -03/20/24 added melatonin 5mg  nightly-- home med too -6/27 patient uses hydroxyzine  for insomnia, if poor sleep continues to be an issue could consider trying a different agent 6-30: Poor sleep related to left shoulder discomfort, does not want more sedating medications on board   5. Neuropsych/cognition: This patient is capable of making decisions on her own behalf. 6. Skin/Wound Care: Routine skin checks 7. Fluids/Electrolytes/Nutrition: Routine in and outs with follow-up chemistries 8.  Hypertension.  Aldactone  100 mg twice daily, minoxidil  2.5 mg daily.  Monitor with increased mobility -6/23 decrease Aldactone  to 25 mg twice daily due to relative  hypotension -6/24 will stop aldactone  and menoxidil- this was for hair growth -6/27 BP stable, continue current regimen and monitor -03/27/24 BP elevated last night and day before, but otherwise stable in between; monitor for now.  - Stable BPs; monitor Vitals:   03/25/24 1523 03/25/24 1913 03/26/24 0632 03/26/24 1559  BP: 120/74 (!) 141/70 106/84 129/87   03/26/24 2154 03/27/24 0613 03/27/24 1315 03/27/24 2205  BP: (!) 143/83 102/75 102/68 110/73   03/28/24 0532 03/28/24 1655 03/28/24 1931 03/29/24 0549  BP: 112/65 121/82 122/81 107/74      9.  Constipation.  Colace 100 mg twice daily, MiraLAX  daily as needed -03/20/24 no BM in 2 days per pt, but doesn't want to adjust meds yet-- if no BM by tomorrow, adjust further -03/21/24 still no BM, increase colace 200mg  BID, add psyllium per pt preference; monitor, if no BM tomorrow, would do sorbitol  -6/23 patient got sorbitol  today monitor for response, increase Senokot to 2 tabs  -6/24 1/2 container bowel prep, enema/suppository if no results-ordered PRN  -6/25 LBM yesterday, improved  -6/26 Schedule miralax  daily, continue Senokot -6/27 LBM 6/24, she denies feeling constipated, will add 2nd dose miralax  prn, consider additional medication if no bowel movement by tomorrow -03/28/24 LBM yesterday, monitor  10.  Diabetes mellitus.  Hemoglobin A1c 5.8.  Blood sugars 103-94-106-134.  Patient on Mounjaro prior to admission and resume at discharge. No need for SSI/CBG checks.  Glucose stable on BMP 03/23/24    11.  Hyperlipidemia.  Lipitor 40  mg daily 12.  History of asthma.  Singulair  10 mg daily.  Claritin  10mg  daily. Flonase  2 sprays nightly. Check oxygen saturations every shift 13.  Class I obesity.  BMI 35.28.  Dietary follow-up  14. Hx b/l CTS surgery; LUE weakness s/p PSF. L digit 1-3 numbness on exam, recommend OP EMG/ncs on discharge. 15. Vit D Deficiency: vit D supp 50k U weekly 16. ABL Anemia: Hgb 10.1 on admission, monitor weekly --  stable 17.  Leukocytosis: likely from steroid, monitor weekly, monitor for s/sx of infection             - 6/23 improved to 11.1 from 12.3.  Monitor for signs of infection  -6/25 WBC 12.5 yesterday, monitor for signs/symptoms of infection 18.  Mild tachycardia             - Continue to monitor trend for now, encourage oral fluids  -Ritalin  may be contributing- she says this was started for fibromyalgia 19. Orthostatic hypotension  -Encourage fluids, adjust medications as above  -ABD binder/TEDs  -see above #8  20. Possible neurogenic bladder  -PVR/Bladder scan-  -6/25-26 mildly elevated PVRs, not needing IC yet, discussed timed voids/double voids. Hold off on flomax due to hypotension. U/A does not indicate likely UTI  6/27 PVRs 89, 216, 0, consider discontinuing tomorrow if stable -03/28/24 PVRs 0 since yesterday morning, will d/c these, but pt with hesitancy and urgency still, would like U/A rechecked-- ordered for today. F/up as available. --With rare bacteria, negative leukocyte esterase or nitrates, low likelihood of infection  21. Dry mouth  - Biotene mouthwash  -6/27 improved, continue to monitor 22. Dyspepsia  -Start protonix  40mg  daily  -6/27 Discussed maalox PRN  LOS: 10 days A FACE TO FACE EVALUATION WAS PERFORMED  Joesph JAYSON Likes 03/29/2024, 7:50 AM

## 2024-03-29 NOTE — Progress Notes (Signed)
 Occupational Therapy Session Note  Patient Details  Name: Eileen Chaney MRN: [id number] Date of Birth: December 23, 1975  Today's Date: 03/29/2024 OT Individual Time: 9094-8984 OT Individual Time Calculation (min): 70 min    Short Term Goals: Week 1:  OT Short Term Goal 1 (Week 1): Patient will direct position of left arm for comfort during self care activity OT Short Term Goal 2 (Week 1): Patient will utilize left hand to wash thighs, right forearm with min assist OT Short Term Goal 3 (Week 1): Patient will  complete toilet trasnfer with close supervision OT Short Term Goal 4 (Week 1): Patient will complete toileting with mod assist  Skilled Therapeutic Interventions/Progress Updates:  Skilled OT intervention completed with focus on pain management/education, BUE ROM, myofascial release. Pt received seated in w/c, agreeable to session. 7/10 pain reported in L cervical region; nurse notified however not due for meds. OT offered rest breaks, repositioning, topical heat and ice for pain reduction.  Donned soft collar with total A provided from partner. Completed all sit > stands and ambulatory transfers using rollator with supervision during session.  Transitioned sit EOM > supine with mod I with wedge/pillows and knee bolster offered for comfort. OT applied heat to L cervical/scapular region in prep for/for pain relief during the following exercises to promote BUE ROM needed for BADLs: (With 1 lb dowel) -bilateral scapular protraction 4x5 -AAROM LUE ER with dowel -RUE ER  Pt with increase in pain with exercises, despite heat and extended breaks offered. OT completed myofascial release to L trapezius region to decrease fascial restrictions and increase joint mobility in a pain free zone. Palpated mod fascial restrictions. Pt verbalized this technique assists the most with pain that is felt. Education provided on prepping exercises with heat application, followed by ice for vasodilatation >  constriction and for recovery.  Transitioned supine > sit with CGA. Ambulated back to room > EOB. Doffed cervical collar with total A. Supervision sit > supine transition. Ice pack applied to L cervical/trap region.  Pt remained semi supine in bed, with bed alarm on/activated, and with all needs in reach at end of session.   Therapy Documentation Precautions:  Precautions Precautions: Cervical, Fall Required Braces or Orthoses: Cervical Brace Cervical Brace: For comfort Restrictions Weight Bearing Restrictions Per Provider Order: No    Therapy/Group: Individual Therapy  Lorrayne FORBES Fritter, MS, OTR/L  03/29/2024, 12:19 PM

## 2024-03-29 NOTE — Progress Notes (Signed)
 Occupational Therapy Weekly Progress Note  Patient Details  Name: Eileen Chaney MRN: [id number] Date of Birth: 1976/03/31  Beginning of progress report period: March 20, 2024 End of progress report period: March 29, 2024  Today's Date: 03/29/2024 OT Individual Time: 1600-1703 OT Individual Time Calculation (min): 63 min    Patient has met 4 of 4 short term goals.  Pt is able to complete all BADL tasks with set-up-SBA including bathing/dressing/toileting/grooming. Pt is utilizing Rolator with SBA for functional transfers and mobility. Unable to demonstrate LUE bicep activation at this time.  Patient continues to demonstrate the following deficits: muscle weakness and acute pain, decreased cardiorespiratoy endurance, and decreased standing balance and decreased balance strategies and therefore will continue to benefit from skilled OT intervention to enhance overall performance with BADL and iADL.  Patient progressing toward long term goals..  Continue plan of care.  OT Short Term Goals Week 1:  OT Short Term Goal 1 (Week 1): Patient will direct position of left arm for comfort during self care activity OT Short Term Goal 1 - Progress (Week 1): Met OT Short Term Goal 2 (Week 1): Patient will utilize left hand to wash thighs, right forearm with min assist OT Short Term Goal 2 - Progress (Week 1): Met OT Short Term Goal 3 (Week 1): Patient will  complete toilet transfer with close supervision OT Short Term Goal 3 - Progress (Week 1): Met OT Short Term Goal 4 (Week 1): Patient will complete toileting with mod assist OT Short Term Goal 4 - Progress (Week 1): Met Week 2:  OT Short Term Goal 1 (Week 2): STG = LTG (d/t ELOS)  Skilled Therapeutic Interventions/Progress Updates:    Patient agreeable to participate in OT session. No reports of pain during session. Monitored for signs during session.   Patient participated in skilled OT session focusing on functional transfers/mobility,  establishing HEP for Left hand strength and discharge planning while discussing environmental modifications and DME needs.   Home layout: 2 level home, 1 tall step into home from garage entrance or 2 steps to a platform followed by 1 step to enter home from back entrance with sliding door. Pt reports that there is gravel ground covering from back entrance with plan to have ramp installed by next week.   Pt plans to use tub/shower versus shower in primary suite d/t to size of bathroom. Has hand held shower head.  Pt education provided on ways to modify environment in kitchen, bathroom, and bedroom as needed d/t limited arm use. Examples provided for each room. Encouraged use of reacher as needed to retrieve items out of reach (high and low). Pt reports she has one at home.  Pt educated on safe and efficient tub/shower transfer utilizing TTB. Pt provided with visual demonstration and verbal instructions while demonstrating understanding by completing tub/shower transfer with SBA.  Pt plans to order TTB online. Educated pt on use of suction cup shower head holder to increase independence with bathing while utilizing long handled sponge as well.  Discussed pet care and safety related to decreasing risk of falls if pets are not well behaved and encouraged asking for assistance with feeding duties if food and water bowls are located on floor.   Assessed bilateral hand strength during session. R grip: 75#, L grip: 55# R lateral pinch: 22#, L lateral pinch: 14# Pt provided with tan putty and HEP including hand strengthening exercises. Pt education provided on form and technique for exercises with pt demonstrating understanding.  Therapy Documentation Precautions:  Precautions Precautions: Cervical, Fall Required Braces or Orthoses: Cervical Brace Cervical Brace: For comfort Restrictions Weight Bearing Restrictions Per Provider Order: No   Therapy/Group: Individual Therapy  Leita Howell,  OTR/L,CBIS  Supplemental OT - [facility] and [facility] Secure Chat Preferred   03/29/2024, 5:17 PM

## 2024-03-29 NOTE — Progress Notes (Addendum)
 Physical Therapy Session Note  Patient Details  Name: Eileen Chaney MRN: [id number] Date of Birth: Feb 20, 1976  Today's Date: 03/29/2024 PT Individual Time: 8652-8541 PT Individual Time Calculation (min): 71 min   Short Term Goals: Week 2:  PT Short Term Goal 1 (Week 2): Pt will navigate 12 steps with unilateral handrial and CGA to access bedroom PT Short Term Goal 2 (Week 2): pt will navigate step for entry into car PT Short Term Goal 3 (Week 2): pt will provide teach back and implementation of energy conservation techniques and safety precautions  Skilled Therapeutic Interventions/Progress Updates:    Pt seated in WC upon arrival. Pt agreeable to therapy. Pt denies any pain.   Pt overall appears much more alert today throughout entire session.   Pt ambulated room <> ortho gym <> main gym with rollator and supervision/mod I.   Pt navigated 6 inch step with use of rollator and CGA for entry into 33 inch high vehicle, verbal cues/demonstration provided for technique/sequencing. Pt performed x2.   Pt articulating fatigue level and intiating seated rest breaks throughout session. Pt demonstrates carry over of pursed lip breathing technique.   Pt navigated 8 6 inch steps with L ascending/R descending handrail and CGA with reciprocal gait prior to requiring seated rest break for fatigue.   Pt ambulated 3x148 feet with no AD and CGA, pt demonstrates improved stability with no LOB and no buckling with reciprocal gait. Vebral cues provided for recipocal arm swing--utilized 2# dowel rod in B UE with therapist facilitating reciprocal arm swing on last 2 gait trails.   Pt performed alternating toe taps on 6 inch step with no UE support and CGA, vebral cues provided for light toe taps.   Pt seated in WC at end of session with all needs within reach and bed alarm on.     Therapy Documentation Precautions:  Precautions Precautions: Cervical, Fall Required Braces or Orthoses: Cervical  Brace Cervical Brace: For comfort Restrictions Weight Bearing Restrictions Per Provider Order: No   Therapy/Group: Individual Therapy  [facility], [place], DPT  03/29/2024, 2:16 PM

## 2024-03-29 NOTE — Patient Instructions (Signed)
 Theraputty Home Exercise Program  Complete 1-2 times a day.  putty squeeze  Pt. should squeeze putty in hand trying to keep it round by rotating putty after each squeeze. push fingers through putty to palm each time. Complete for ___1-2___ minutes or 10-15 squeezes.   PUTTY KEY GRIP  Hold the putty at the top of your hand. Squeeze the putty between your thumb and the side of your 2nd finger as shown. Complete for __1-2______ minutes or 10-15 pinches.    PUTTY 3 JAW CHUCK  Roll up some putty into a ball then flatten it. Then, firmly squeeze it with your first 3 fingers as shown. Complete for ___1-2___ minutes or 10-15 pinches.

## 2024-03-30 ENCOUNTER — Other Ambulatory Visit (HOSPITAL_COMMUNITY): Payer: Self-pay

## 2024-03-30 ENCOUNTER — Telehealth: Payer: Self-pay

## 2024-03-30 DIAGNOSIS — D62 Acute posthemorrhagic anemia: Secondary | ICD-10-CM

## 2024-03-30 DIAGNOSIS — M542 Cervicalgia: Secondary | ICD-10-CM

## 2024-03-30 MED ORDER — ATORVASTATIN CALCIUM 40 MG PO TABS: 40.0000 mg | ORAL_TABLET | Freq: Every day | ORAL | Status: DC

## 2024-03-30 MED ORDER — PANTOPRAZOLE SODIUM 40 MG PO TBEC: 40.0000 mg | DELAYED_RELEASE_TABLET | Freq: Every day | ORAL | Status: DC

## 2024-03-30 MED ORDER — DOCUSATE SODIUM 100 MG PO CAPS
200.0000 mg | ORAL_CAPSULE | Freq: Two times a day (BID) | ORAL | Status: DC
Start: 2024-03-30 — End: 2024-04-13

## 2024-03-30 MED ORDER — HYDROXYZINE HCL 25 MG PO TABS
50.0000 mg | ORAL_TABLET | Freq: Every evening | ORAL | 0 refills | Status: DC | PRN
  Filled 2024-03-30: qty 20, 10d supply, fill #0

## 2024-03-30 MED ORDER — PSYLLIUM 95 % PO PACK
1.0000 | PACK | Freq: Two times a day (BID) | ORAL | Status: DC
Start: 2024-03-30 — End: 2024-04-13

## 2024-03-30 MED ORDER — MONTELUKAST SODIUM 10 MG PO TABS: 10.0000 mg | ORAL_TABLET | Freq: Every day | ORAL | Status: DC

## 2024-03-30 MED ORDER — POLYETHYLENE GLYCOL 3350 17 G PO PACK: 17.0000 g | PACK | Freq: Every day | ORAL | Status: DC

## 2024-03-30 NOTE — Telephone Encounter (Signed)
 Note from Dr Murray Collier on 03/30/24: -Pt asks about EMG/NCS UE, I think this could be beneficial outpatient, likely with neurology  Ms Boyack called to our office today. She is requesting that we order an EMG/NCS to be done ASAP to find out which nerves are damaged so she can do therapy ASAP to rectify this. She still does not have full use of either arm.   There have ben some improvements in her right arm, but it still has deficits. She can now use both hands, but her left arm is still useless. She would like to find out as quickly as possible what the issue is so she can get the right treatment as quickly as possible. She is going home from inpatient rehab this Thursday. She would prefer [facility] for the EMG, but if [facility] can get her in sooner, she is also willing to go there.  Her next follow up with our office is 7/29, but she is willing/able to come in next week to see us  if needed.  I informed her that Dr Clois is out of the office, but that I will send this message to Danielle and give her a call back as soon as possible.

## 2024-03-30 NOTE — Progress Notes (Signed)
 Physical Therapy Session Note  Patient Details  Name: Eileen Chaney MRN: [id number] Date of Birth: May 01, 1976  Today's Date: 03/30/2024 PT Individual Time: 0917-1029 PT Individual Time Calculation (min): 72 min   Short Term Goals: Week 2:  PT Short Term Goal 1 (Week 2): Pt will navigate 12 steps with unilateral handrial and CGA to access bedroom PT Short Term Goal 2 (Week 2): pt will navigate step for entry into car PT Short Term Goal 3 (Week 2): pt will provide teach back and implementation of energy conservation techniques and safety precautions  Skilled Therapeutic Interventions/Progress Updates:      Pt supine in bed upon arrival. Pt agreeable to therapy. Pt denies any pain.   Of note: pt overall more alert Sunday-today, more receptive to education/recommendations. Pt requesting to D/C sooner. Upon further discussion with pt, caregiver and IDT, adjusted D/C date to 7/3.   Pt partner present for hands on family training. Education provided for ambulatory transfer to car and to high level bed with use of rollator and box step and CGA.  Pt reports she has purchased box step on [employer]. Pt and pt partner returned demonstration. Recommended pt remove mattress pad on top of bed to reduce height of bed. Pt verbalized understanding and agreeable.   Discussed home entry. Pt reports ramp will not be installed on back entry until July 6th. Discussed alternative entry options (garage entry, and front door entry), however determined back entry the safest option-despite gravel pathway to access back entry. Pt navigated mulch with R HHA (to simulate gravel walkway to back entrance). Pt and pt partner returned demonstration, and deny any questions or concerns. Recommended pt perform stair navigation for back entrance with R HHA with step to gait until ramp installed as pt does not have handrails on back entry steps. Pt and caregiver returned demonstration.   Pt navigated 20 6 inch steps with  combination of B handrail and unilateral handrail per home set up and reciprocal gait initially, and step to gait with fatigue with mod I.  Pt supine in bed at end of session with all needs within reach and bed alarm on.    Therapy Documentation Precautions:  Precautions Precautions: Cervical, Fall Required Braces or Orthoses: Cervical Brace Cervical Brace: For comfort Restrictions Weight Bearing Restrictions Per Provider Order: No   Therapy/Group: Individual Therapy  [facility] [place], [place], DPT  03/30/2024, 7:52 AM

## 2024-03-30 NOTE — Progress Notes (Signed)
 Patient ID: Eileen Chaney, female   DOB: [birth year], 48 y.o.   MRN: [id number] Team feeling pt is doing well and close to goal level. Have ordered rollator and pt to get tub bench on own. Will make referral to [facility] OP for follow up. Possible dc date 7/3.

## 2024-03-30 NOTE — Progress Notes (Signed)
 PROGRESS NOTE   Subjective/Complaints: Patient reports her pain is improving, she has been doing well with decreased dose of Dilaudid .  She is not using this medication very frequently.  Asks about outpatient EMG/nerve conduction study.  ROS: Patient denies fever,  nausea, vomiting, diarrhea,  shortness of breath or chest pain, headache, or mood change. + dizziness/double vision-no complaints today + constipation-no complaints today + heartburn- i no complaints today + insomnia-ongoing, due to shoulder pain +urinary urgency/hesitancy-no complaints today  Objective:   No results found. Recent Labs    03/29/24 0505  WBC 9.4  HGB 9.4*  HCT 30.2*  PLT 324    Recent Labs    03/29/24 0505  NA 136  K 3.8  CL 106  CO2 21*  GLUCOSE 149*  BUN 6  CREATININE 0.67  CALCIUM  8.4*     Intake/Output Summary (Last 24 hours) at 03/30/2024 1256 Last data filed at 03/30/2024 1100 Gross per 24 hour  Intake 236 ml  Output --  Net 236 ml        Physical Exam: Vital Signs Blood pressure 119/69, pulse 83, temperature 98 F (36.7 C), resp. rate 17, height 5' 8 (1.727 m), weight 108.3 kg, SpO2 100%.  Physical Exam Constitutional: No apparent distress.  Working with therapy in the gym HENT: No JVD, MMM this morning Cardiovascular: RRR, no MRG Respiratory: CTAB.  Nonlabored breathing on room air Psych: Affect more bright/improved from last week Abdomen: + bowel sounds, normoactive. No distention or tenderness.  Skin: C/D/I. No apparent lesions.  Surgical incision dry, intact.   MSK:      No apparent deformity. + LUE sling for comfort      + TTP L posterior shoulder - no palpable deformity-unchanged   Neurologic exam: Alert and awake follows commands, cranial nerves II through XII grossly intact, EOMI Sensation: To light touch intact in BL LE; LUE diffusely reduced compared to LUE; absent digits 1-3     Coordination: No  apparent tremors. No ataxia  Spasticity: MAS 0 in all extremities.  Strength:                RUE: 3-4/5 SA, 5-/5 EF, 5-/5 EE, 5-/5 WE, 5-/5 FF, 5-/5 FA                LUE:  1/5 SA, 2/5 EF, 3/5 EE, 4-/5 WE, 4/5 FF, 4/5 FA                RLE: 5/5 HF, 5/5 KE, 5/5  DF, 5/5  EHL, 5/5  PF                 LLE:  5/5 HF, 5/5 KE, 5/5  DF, 5/5  EHL, 5/5  PF  Physical exam unchanged from the above on reexamination 03/30/24    Assessment/Plan: 1. Functional deficits which require 3+ hours per day of interdisciplinary therapy in a comprehensive inpatient rehab setting. Physiatrist is providing close team supervision and 24 hour management of active medical problems listed below. Physiatrist and rehab team continue to assess barriers to discharge/monitor patient progress toward functional and medical goals  Care Tool:  Bathing    Body parts bathed  by patient: Chest, Abdomen, Right upper leg, Left upper leg, Right lower leg, Left lower leg, Face, Front perineal area   Body parts bathed by helper: Right arm, Left arm, Buttocks     Bathing assist Assist Level: Moderate Assistance - Patient 50 - 74%     Upper Body Dressing/Undressing Upper body dressing   What is the patient wearing?: Pull over shirt    Upper body assist Assist Level: Maximal Assistance - Patient 25 - 49%    Lower Body Dressing/Undressing Lower body dressing      What is the patient wearing?: Pants     Lower body assist Assist for lower body dressing: Moderate Assistance - Patient 50 - 74%     Toileting Toileting Toileting Activity did not occur (Clothing management and hygiene only): N/A (no void or bm)  Toileting assist Assist for toileting: Moderate Assistance - Patient 50 - 74%     Transfers Chair/bed transfer  Transfers assist     Chair/bed transfer assist level: Supervision/Verbal cueing     Locomotion Ambulation   Ambulation assist      Assist level: Supervision/Verbal cueing Assistive device:  Rollator Max distance: 208   Walk 10 feet activity   Assist     Assist level: Supervision/Verbal cueing Assistive device: Rollator   Walk 50 feet activity   Assist    Assist level: Supervision/Verbal cueing Assistive device: Rollator    Walk 150 feet activity   Assist    Assist level: Supervision/Verbal cueing Assistive device: Rollator    Walk 10 feet on uneven surface  activity   Assist Walk 10 feet on uneven surfaces activity did not occur: Safety/medical concerns         Wheelchair     Assist Is the patient using a wheelchair?: No             Wheelchair 50 feet with 2 turns activity    Assist            Wheelchair 150 feet activity     Assist          Blood pressure 119/69, pulse 83, temperature 98 F (36.7 C), resp. rate 17, height 5' 8 (1.727 m), weight 108.3 kg, SpO2 100%.   Medical Problem List and Plan: 1. Functional deficits secondary to cervical myelopathy status post C3-7 PSF, C4-6 decompression 03/15/2024 with postoperative left upper extremity weakness as well as history of cervical laminectomy 01/07/2022 as well as 11/12/2021.  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              -patient may shower with incisions covered              -ELOS/Goals: 10-12 days, Min A PT, OT             -Continue CIR - Therapy reports surgical dressing got wet after shower, will remove and start dry dressing daily as it has been 7 days since her surgery -Discussed with neurosurgery, cervical collar for comfort when out of bed  -Expected discharge 7/10 - Continue to monitor for intermittent dizziness/double vision-improved today 6/27 -Pt asks about EMG/NCS UE, I think this could be beneficial outpatient, likely with neurology - Discussed with therapy, potential discharge Wednesday   2.  Antithrombotics: -DVT/anticoagulation:  Pharmaceutical: Lovenox  40mg  daily, vascular study neg 03/21/24             -antiplatelet therapy: N/A 3. Pain  Management: Lyrica  150 mg 3 times daily, Dilaudid  2 mg every 4 hours as moderate pain  needed and 4 mg every 4 hours as needed severe pain, Robaxin  1000 mg every 6 hours, Valium  as needed muscle spasms              - Patient with slight allergy to dilaudid , has benadryll 35 mg Q6H PRN  - Poorly controlled L posteiro shoulder neuropathic pain - added elavil  25 mg at bedtime for pain/sleep + oxycodone  5 mg TID (no allergy prior, scheduled before therapies/bed). Did fail pain management with oxycodone  IR prior to dilaudid .  -03/20/24 pain management still an issue, mostly related to doses not coming on time -changed scheduled oxy 5mg  IR to 0600/1300/2100 to have it on off-hours but related to PT schedule and sleep schedule.  -ordered Kpad -Lidoderm  patches for L shoulder -call on call provider if pain control issues-- might change oxy to SR form -6/28-29/25 pain doing much better overall 6-30: Reduce Dilaudid  from 2 to 4 mg every 6 hours as needed to 1 to 2 mg every 6 hours as needed, per patient request to start weaning pain medication 7/1 pain doing better, not using Dilaudid  frequently, continue current regimen as needed for now  4. Mood/Behavior/Sleep/ADHD: Ritalin  20 mg every morning, Effexor  75 mg daily, Atarax  50 mg nightly as needed sleep,              -antipsychotic agents: N/A              - added elavil  25 mg at bedtime -03/20/24 added melatonin 5mg  nightly-- home med too -6/27 patient uses hydroxyzine  for insomnia, if poor sleep continues to be an issue could consider trying a different agent 6-30: Poor sleep related to left shoulder discomfort, does not want more sedating medications on board   5. Neuropsych/cognition: This patient is capable of making decisions on her own behalf. 6. Skin/Wound Care: Routine skin checks 7. Fluids/Electrolytes/Nutrition: Routine in and outs with follow-up chemistries 8.  Hypertension.  Aldactone  100 mg twice daily, minoxidil  2.5 mg daily.  Monitor with  increased mobility -6/23 decrease Aldactone  to 25 mg twice daily due to relative hypotension -6/24 will stop aldactone  and menoxidil- this was for hair growth -6/27 BP stable, continue current regimen and monitor -03/27/24 BP elevated last night and day before, but otherwise stable in between; monitor for now.  - 7/1 BP controlled continue to monitor Vitals:   03/26/24 1559 03/26/24 2154 03/27/24 0613 03/27/24 1315  BP: 129/87 (!) 143/83 102/75 102/68   03/27/24 2205 03/28/24 0532 03/28/24 1655 03/28/24 1931  BP: 110/73 112/65 121/82 122/81   03/29/24 0549 03/29/24 1710 03/29/24 1939 03/30/24 0615  BP: 107/74 124/79 117/74 119/69      9.  Constipation.  Colace 100 mg twice daily, MiraLAX  daily as needed -03/20/24 no BM in 2 days per pt, but doesn't want to adjust meds yet-- if no BM by tomorrow, adjust further -03/21/24 still no BM, increase colace 200mg  BID, add psyllium per pt preference; monitor, if no BM tomorrow, would do sorbitol  -6/23 patient got sorbitol  today monitor for response, increase Senokot to 2 tabs  -6/24 1/2 container bowel prep, enema/suppository if no results-ordered PRN  -6/25 LBM yesterday, improved  -6/26 Schedule miralax  daily, continue Senokot -6/27 LBM 6/24, she denies feeling constipated, will add 2nd dose miralax  prn, consider additional medication if no bowel movement by tomorrow -7/1 LBM recorded was 6/29-continue current regimen and monitor  10.  Diabetes mellitus.  Hemoglobin A1c 5.8.  Blood sugars 103-94-106-134.  Patient on Mounjaro prior to admission and resume at  discharge. No need for SSI/CBG checks.  Glucose stable on BMP 03/23/24    11.  Hyperlipidemia.  Lipitor 40 mg daily 12.  History of asthma.  Singulair  10 mg daily.  Claritin  10mg  daily. Flonase  2 sprays nightly. Check oxygen saturations every shift 13.  Class I obesity.  BMI 35.28.  Dietary follow-up  14. Hx b/l CTS surgery; LUE weakness s/p PSF. L digit 1-3 numbness on exam, recommend OP  EMG/ncs on discharge. 15. Vit D Deficiency: vit D supp 50k U weekly 16. ABL Anemia: Hgb 10.1 on admission, monitor weekly -- stable  - 7/1 hemoglobin overall stable at 9.4 yesterday 17.  Leukocytosis: likely from steroid, monitor weekly, monitor for s/sx of infection             - 6/23 improved to 11.1 from 12.3.  Monitor for signs of infection  -7/1 WBC within normal range at 9.4 yesterday 18.  Mild tachycardia             - Continue to monitor trend for now, encourage oral fluids  -Ritalin  may be contributing- she says this was started for fibromyalgia 19. Orthostatic hypotension  -Encourage fluids, adjust medications as above  -ABD binder/TEDs  -see above #8  20. Possible neurogenic bladder  -PVR/Bladder scan-  -6/25-26 mildly elevated PVRs, not needing IC yet, discussed timed voids/double voids. Hold off on flomax due to hypotension. U/A does not indicate likely UTI  6/27 PVRs 89, 216, 0, consider discontinuing tomorrow if stable -03/28/24 PVRs 0 since yesterday morning, will d/c these, but pt with hesitancy and urgency still, would like U/A rechecked-- ordered for today. F/up as available. --With rare bacteria, negative leukocyte esterase or nitrates, low likelihood of infection  21. Dry mouth  - Biotene mouthwash  -6/27 improved, continue to monitor 22. Dyspepsia  -Start protonix  40mg  daily  -6/27 Discussed maalox PRN  LOS: 11 days A FACE TO FACE EVALUATION WAS PERFORMED  Murray Collier 03/30/2024, 12:56 PM

## 2024-03-30 NOTE — Progress Notes (Signed)
 Occupational Therapy Session Note  Patient Details  Name: Eileen Chaney MRN: [id number] Date of Birth: 1976/04/17  Today's Date: 03/30/2024 OT Individual Time: 0805-0900 & [id number] OT Individual Time Calculation (min): 55 min & 70 min   Short Term Goals: Week 2:  OT Short Term Goal 1 (Week 2): STG = LTG (d/t ELOS)  Skilled Therapeutic Interventions/Progress Updates:  Session 1 Skilled OT intervention completed with focus on pain management, ROM, functional mobility. Pt received seated EOB, agreeable to session. 4/10 pain reported in L scapular/cervical region; nurse issued meds during session. OT offered rest breaks, repositioning and heat/ice throughout for pain reduction.  Pt declined self-care needs. Completed all sit > stands and ambulatory transfers using rollator with supervision using rollator during session.   Ambulated > gym. Transitioned sit > supine on back wedge with pillows with mod I. Doffed cervical collar with total A. Applied hot pack to L scapular and cervical region in prep for exercises as well as pain management as pt reports pain was rough getting ready this morning. Removed after 20 mins with skin intact.   Pt completed the following exercises to reduce pain and promote functional use of BUE needed for BADLs: (In supine- With 1 lb dowel) -bilateral scapular protraction 4x5 -AAROM LUE ER with dowel (In sitting- table slides) -Unilateral shoulder flexion x10 each side -Unilateral abduction x10 each side  Ambulated back to room and applied ice pack to previous pain region. Pt remained upright in bed, with bed alarm on/activated, and with all needs in reach at end of session.  Session 2 Skilled OT intervention completed with focus on ambulatory endurance, community mobility, LUE pain management and positioning education. Pt received seated EOB, agreeable to session. Unrated pain reported in L shoulder/scapular/cervical region; nurse issued meds at start of  session. OT offered rest breaks, repositioning and k-tape for pain reduction.  Pt completed all sit > stands and ambulatory transfers using rollator with mod I, supervision without AD throughout.  Navigated from room <> elevator, <> outside courtyard and gift shop without LOB or fatigue. Did have 1 intermittent seated rest break more for supported sitting for neck pain vs cardiovascular fatigue. Practiced ambulating on uneven surfaces, pavement, incline, decline. No LOB. Discussed relation to her home environment if she were to navigate outside and safety considerations. Navigated through gift shop to simulate community mobility I.e. grocery shopping. Pt able to navigate small spaces with rollator without difficulty.  In room, pt required min A to doff shirt. OT applied k-tape to deltoid/humerus region to promote proximal support of LUE and for pain reduction. Instructed pt on tape precautions and that if area becomes red/irritated to remove promptly and that k-tape is to be worn for 2-3 days at a time. Pt ambulated about 200 ft with report that LUE pain was significantly less and felt more support. Issued pt an Engineer, mining to follow for application at home with her partner's assist.   Pt remained seated in w/c, direct care handoff to NT.   Therapy Documentation Precautions:  Precautions Precautions: Cervical, Fall Required Braces or Orthoses: Cervical Brace Cervical Brace: For comfort Restrictions Weight Bearing Restrictions Per Provider Order: No    Therapy/Group: Individual Therapy  Eileen FORBES Fritter, MS, OTR/L  03/30/2024, 2:20 PM

## 2024-03-31 ENCOUNTER — Other Ambulatory Visit: Payer: Self-pay | Admitting: Neurosurgery

## 2024-03-31 ENCOUNTER — Other Ambulatory Visit (HOSPITAL_COMMUNITY): Payer: Self-pay

## 2024-03-31 DIAGNOSIS — R29898 Other symptoms and signs involving the musculoskeletal system: Secondary | ICD-10-CM

## 2024-03-31 MED ORDER — HYDROMORPHONE HCL 2 MG PO TABS
1.0000 mg | ORAL_TABLET | ORAL | 0 refills | Status: DC | PRN
  Filled 2024-03-31: qty 30, 7d supply, fill #0

## 2024-03-31 MED ORDER — FOLIC ACID 1 MG PO TABS
1.0000 mg | ORAL_TABLET | Freq: Every day | ORAL | 0 refills | Status: AC
Start: 2024-03-31 — End: ?
  Filled 2024-03-31: qty 30, 30d supply, fill #0

## 2024-03-31 MED ORDER — AMITRIPTYLINE HCL 25 MG PO TABS
25.0000 mg | ORAL_TABLET | Freq: Every day | ORAL | 0 refills | Status: DC
  Filled 2024-03-31: qty 30, 30d supply, fill #0

## 2024-03-31 MED ORDER — MELATONIN 5 MG PO TABS
5.0000 mg | ORAL_TABLET | Freq: Every day | ORAL | 0 refills | Status: AC
  Filled 2024-03-31: qty 30, 30d supply, fill #0

## 2024-03-31 MED ORDER — OXYCODONE HCL 5 MG PO TABS
5.0000 mg | ORAL_TABLET | ORAL | 0 refills | Status: DC | PRN
  Filled 2024-03-31: qty 30, 3d supply, fill #0

## 2024-03-31 MED ORDER — METHOCARBAMOL 500 MG PO TABS
1000.0000 mg | ORAL_TABLET | Freq: Four times a day (QID) | ORAL | 0 refills | Status: DC
  Filled 2024-03-31: qty 240, 30d supply, fill #0

## 2024-03-31 MED ORDER — LIDOCAINE 5 % EX PTCH
1.0000 | MEDICATED_PATCH | CUTANEOUS | 0 refills | Status: DC
  Filled 2024-03-31: qty 30, 30d supply, fill #0

## 2024-03-31 MED ORDER — VITAMIN D (ERGOCALCIFEROL) 1.25 MG (50000 UNIT) PO CAPS
50000.0000 [IU] | ORAL_CAPSULE | ORAL | 0 refills | Status: AC
  Filled 2024-03-31: qty 5, 35d supply, fill #0

## 2024-03-31 MED ORDER — OXYCODONE HCL 5 MG PO TABS
5.0000 mg | ORAL_TABLET | ORAL | Status: DC | PRN
  Administered 2024-03-31 – 2024-04-01 (×4): 10 mg via ORAL
  Filled 2024-03-31 (×4): qty 2

## 2024-03-31 MED ORDER — DIAZEPAM 5 MG PO TABS
5.0000 mg | ORAL_TABLET | Freq: Three times a day (TID) | ORAL | 0 refills | Status: DC | PRN
  Filled 2024-03-31: qty 30, 10d supply, fill #0

## 2024-03-31 MED ORDER — PREGABALIN 150 MG PO CAPS
150.0000 mg | ORAL_CAPSULE | Freq: Three times a day (TID) | ORAL | 0 refills | Status: DC
  Filled 2024-03-31: qty 90, 30d supply, fill #0

## 2024-03-31 NOTE — Telephone Encounter (Signed)
 I notified the patient that we will keep her appointment for 7/29 with Dr Clois. Per discussion with the patient, I changed her September appointment to Danielle's schedule for continuity of care.  The patient inquired about suture removal. Her operative report states her sutures are Monocryl. A student was in the room with her and reported that he saw black sutures in her incision. I advised that the provider at inpatient rehab can remove the sutures if they feel comfortable doing so as long as they feel her incision looks OK, or we can bring her in tomorrow or early next week for this.

## 2024-03-31 NOTE — Progress Notes (Addendum)
 PROGRESS NOTE   Subjective/Complaints: Pt had increased pain last night.  She had called NSGY office about getting EMG/NCS scheduled.   ROS: Patient denies fever,  nausea, vomiting, diarrhea,constipation,  shortness of breath or chest pain, headache, dizziness, or mood change + heartburn- resolved + insomnia-ongoing, due to shoulder pain +urinary urgency/hesitancy-improved  Objective:   No results found. Recent Labs    03/29/24 0505  WBC 9.4  HGB 9.4*  HCT 30.2*  PLT 324    Recent Labs    03/29/24 0505  NA 136  K 3.8  CL 106  CO2 21*  GLUCOSE 149*  BUN 6  CREATININE 0.67  CALCIUM  8.4*     Intake/Output Summary (Last 24 hours) at 03/31/2024 9062 Last data filed at 03/31/2024 9191 Gross per 24 hour  Intake 944 ml  Output --  Net 944 ml        Physical Exam: Vital Signs Blood pressure (P) 123/80, pulse (P) 78, temperature (P) 98 F (36.7 C), temperature source (P) Oral, resp. rate (P) 16, height 5' 8 (1.727 m), weight 108.3 kg, SpO2 (P) 100%.  Physical Exam Constitutional: No apparent distress.  Sitting in bedside chair HENT: No JVD, MMM this morning Cardiovascular: RRR, no MRG Respiratory: CTAB.  Nonlabored breathing on room air Psych: pleasant, less anxious appearing Abdomen: + bowel sounds, normoactive. No distention or tenderness.  Skin: C/D/I. No apparent lesions.  Surgical incision dry, intact.   MSK:      No apparent deformity. + LUE sling for comfort      + TTP L posterior shoulder - no palpable deformity-unchanged   Neurologic exam: Alert and awake follows commands, cranial nerves II through XII grossly intact, EOMI Sensation: To light touch intact in BL LE; LUE diffusely reduced compared to LUE; absent digits 1-3     Strength:                RUE: 2/5 SA, 5-/5 EF, 5-/5 EE, 5-/5 WE, 5-/5 FF, 5-/5 FA                LUE:  1/5 SA, 1/5 EF, 3/5 EE, 4-/5 WE, 4/5 FF, 4/5 FA                RLE:  5/5 HF, 5/5 KE, 5/5  DF, 5/5  EHL, 5/5  PF                 LLE:  5/5 HF, 5/5 KE, 5/5  DF, 5/5  EHL, 5/5  PF  Physical exam unchanged from the above on reexamination 03/31/24    Assessment/Plan: 1. Functional deficits which require 3+ hours per day of interdisciplinary therapy in a comprehensive inpatient rehab setting. Physiatrist is providing close team supervision and 24 hour management of active medical problems listed below. Physiatrist and rehab team continue to assess barriers to discharge/monitor patient progress toward functional and medical goals  Care Tool:  Bathing    Body parts bathed by patient: Chest, Abdomen, Right upper leg, Left upper leg, Right lower leg, Left lower leg, Face, Front perineal area   Body parts bathed by helper: Right arm, Left arm, Buttocks     Bathing  assist Assist Level: Moderate Assistance - Patient 50 - 74%     Upper Body Dressing/Undressing Upper body dressing   What is the patient wearing?: Pull over shirt    Upper body assist Assist Level: Maximal Assistance - Patient 25 - 49%    Lower Body Dressing/Undressing Lower body dressing      What is the patient wearing?: Pants     Lower body assist Assist for lower body dressing: Moderate Assistance - Patient 50 - 74%     Toileting Toileting Toileting Activity did not occur (Clothing management and hygiene only): N/A (no void or bm)  Toileting assist Assist for toileting: Moderate Assistance - Patient 50 - 74%     Transfers Chair/bed transfer  Transfers assist     Chair/bed transfer assist level: Independent with assistive device     Locomotion Ambulation   Ambulation assist      Assist level: Independent with assistive device Assistive device: Rollator Max distance: 200+   Walk 10 feet activity   Assist     Assist level: Independent with assistive device Assistive device: Rollator   Walk 50 feet activity   Assist    Assist level: Independent with  assistive device Assistive device: Rollator    Walk 150 feet activity   Assist    Assist level: Independent with assistive device Assistive device: Rollator    Walk 10 feet on uneven surface  activity   Assist Walk 10 feet on uneven surfaces activity did not occur: Safety/medical concerns   Assist level: Supervision/Verbal cueing Assistive device: Rollator (or HHA without AD)   Wheelchair     Assist Is the patient using a wheelchair?: No             Wheelchair 50 feet with 2 turns activity    Assist            Wheelchair 150 feet activity     Assist          Blood pressure (P) 123/80, pulse (P) 78, temperature (P) 98 F (36.7 C), temperature source (P) Oral, resp. rate (P) 16, height 5' 8 (1.727 m), weight 108.3 kg, SpO2 (P) 100%.   Medical Problem List and Plan: 1. Functional deficits secondary to cervical myelopathy status post C3-7 PSF, C4-6 decompression 03/15/2024 with postoperative left upper extremity weakness as well as history of cervical laminectomy 01/07/2022 as well as 11/12/2021.  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 Pt has called neurosurgery about getting this scheduled. Today we discussed realistic expectations with this study.  - Discussed with therapy, potential discharge Wednesday -Team conference today please see physician documentation under team conference tab, met with team  to  discuss problems,progress, and goals. Formulized individual treatment plan based on medical history, underlying problem and comorbidities.     2.  Antithrombotics: -DVT/anticoagulation:  Pharmaceutical:  Lovenox  40mg  daily, vascular study neg 03/21/24             -antiplatelet therapy: N/A 3. Pain Management: Lyrica  150 mg 3 times daily, Dilaudid  2 mg every 4 hours as moderate pain needed and 4 mg every 4 hours as needed severe pain, Robaxin  1000 mg every 6 hours, Valium  as needed muscle spasms              - Patient with slight allergy to dilaudid , has benadryll 35 mg Q6H PRN  - Poorly controlled L posteiro shoulder neuropathic pain - added elavil  25 mg at bedtime for pain/sleep + oxycodone  5 mg TID (no allergy prior, scheduled before therapies/bed). Did fail pain management with oxycodone  IR prior to dilaudid .  -03/20/24 pain management still an issue, mostly related to doses not coming on time -changed scheduled oxy 5mg  IR to 0600/1300/2100 to have it on off-hours but related to PT schedule and sleep schedule.  -ordered Kpad -Lidoderm  patches for L shoulder -call on call provider if pain control issues-- might change oxy to SR form -6/28-29/25 pain doing much better overall 6-30: Reduce Dilaudid  from 2 to 4 mg every 6 hours as needed to 1 to 2 mg every 6 hours as needed, per patient request to start weaning pain medication 7/2 Change dilaudid  PRN to oxycodone  5-10 mg PRN, continue scheduled oxycodone  for now  4. Mood/Behavior/Sleep/ADHD: Ritalin  20 mg every morning, Effexor  75 mg daily, Atarax  50 mg nightly as needed sleep,              -antipsychotic agents: N/A              - added elavil  25 mg at bedtime -03/20/24 added melatonin 5mg  nightly-- home med too -6/27 patient uses hydroxyzine  for insomnia, if poor sleep continues to be an issue could consider trying a different agent 6-30: Poor sleep related to left shoulder discomfort, does not want more sedating medications on board   5.  Neuropsych/cognition: This patient is capable of making decisions on her own behalf. 6. Skin/Wound Care: Routine skin checks 7. Fluids/Electrolytes/Nutrition: Routine in and outs with follow-up chemistries 8.  Hypertension.  Aldactone  100 mg twice daily, minoxidil  2.5 mg daily.  Monitor with increased mobility -6/23 decrease Aldactone  to 25 mg twice daily due to relative hypotension -6/24 will stop aldactone  and menoxidil- this was for hair growth -6/27 BP stable, continue current regimen and monitor -03/27/24 BP elevated last night and day before, but otherwise stable in between; monitor for now.  - 7/1-2 BP controlled continue to monitor Vitals:   03/27/24 1315 03/27/24 2205 03/28/24 0532 03/28/24 1655  BP: 102/68 110/73 112/65 121/82   03/28/24 1931 03/29/24 0549 03/29/24 1710 03/29/24 1939  BP: 122/81 107/74 124/79 117/74   03/30/24 0615 03/30/24 1418 03/30/24 2021 03/31/24 0626  BP: 119/69 130/78 129/78 (P) 123/80      9.  Constipation.  Colace 100 mg twice daily, MiraLAX  daily as needed -03/20/24 no BM in 2 days per pt, but doesn't want to adjust meds yet-- if no BM by tomorrow, adjust further -03/21/24 still no BM, increase colace 200mg  BID, add psyllium per pt preference; monitor, if no BM tomorrow, would do sorbitol  -6/23 patient got sorbitol  today monitor for response, increase Senokot to 2 tabs  -6/24 1/2 container bowel prep, enema/suppository if no results-ordered PRN  -6/25 LBM yesterday, improved  -6/26 Schedule miralax  daily, continue Senokot -6/27 LBM 6/24, she denies feeling constipated, will add 2nd dose miralax  prn, consider additional  medication if no bowel movement by tomorrow -7/2 LBM 6/30, continue to monitor   10.  Diabetes mellitus.  Hemoglobin A1c 5.8.  Blood sugars 103-94-106-134.  Patient on Mounjaro prior to admission and resume at discharge. No need for SSI/CBG checks.  Glucose stable on BMP 03/23/24    11.  Hyperlipidemia.  Lipitor 40 mg daily 12.  History  of asthma.  Singulair  10 mg daily.  Claritin  10mg  daily. Flonase  2 sprays nightly. Check oxygen saturations every shift 13.  Class I obesity.  BMI 35.28.  Dietary follow-up  14. Hx b/l CTS surgery; LUE weakness s/p PSF. L digit 1-3 numbness on exam, recommend OP EMG/ncs on discharge. 15. Vit D Deficiency: vit D supp 50k U weekly 16. ABL Anemia: Hgb 10.1 on admission, monitor weekly -- stable  - 7/1 hemoglobin overall stable at 9.4 yesterday 17.  Leukocytosis: likely from steroid, monitor weekly, monitor for s/sx of infection             - 6/23 improved to 11.1 from 12.3.  Monitor for signs of infection  -7/1 WBC within normal range at 9.4 yesterday 18.  Mild tachycardia, improved             - Continue to monitor trend for now, encourage oral fluids  -Ritalin  may be contributing- she says this was started for fibromyalgia  -HR stable, improved    03/31/2024    6:26 AM 03/30/2024    8:21 PM 03/30/2024    2:18 PM  Vitals with BMI  Systolic 123 129 869  Diastolic 80 78 78  Pulse 78 92 86    19. Orthostatic hypotension  -Encourage fluids, adjust medications as above  -ABD binder/TEDs  -see above #8  20. Possible neurogenic bladder  -PVR/Bladder scan-  -6/25-26 mildly elevated PVRs, not needing IC yet, discussed timed voids/double voids. Hold off on flomax due to hypotension. U/A does not indicate likely UTI  6/27 PVRs 89, 216, 0, consider discontinuing tomorrow if stable -03/28/24 PVRs 0 since yesterday morning, will d/c these, but pt with hesitancy and urgency still, would like U/A rechecked-- ordered for today. F/up as available. --With rare bacteria, negative leukocyte esterase or nitrates, low likelihood of infection  21. Dry mouth  - Biotene mouthwash  -6/27 improved, continue to monitor  22. Dyspepsia, improved   -Start protonix  40mg  daily  -6/27 Discussed maalox PRN  LOS: 12 days A FACE TO FACE EVALUATION WAS PERFORMED  Murray Collier 03/31/2024, 9:37 AM

## 2024-03-31 NOTE — Progress Notes (Signed)
 Occupational Therapy Discharge Summary  Patient Details  Name: FLARA STORTI MRN: [id number] Date of Birth: [birth year]  Date of Discharge from OT service:{Time; dates multiple:304500300}  {CHL IP REHAB OT TIME CALCULATIONS:304400400}   Patient has met {NUMBERS 0-12:18577} of {NUMBERS 0-12:18577} long term goals due to {due un:6958348}.  Patient to discharge at overall {LOA:3049010} level.  Patient's care partner {care partner:3041650} to provide the necessary {assistance:3041652} assistance at discharge.    Reasons goals not met: ***  Recommendation:  Patient will benefit from ongoing skilled OT services in {setting:3041680} to continue to advance functional skills in the area of {ADL/iADL:3041649}.  Equipment: {equipment:3041657}  Reasons for discharge: {Reason for discharge:3049018}  Patient/family agrees with progress made and goals achieved: {Pt/Family agree with progress/goals:3049020}  OT Discharge Precautions/Restrictions  Precautions Precautions: Cervical;Fall Recall of Precautions/Restrictions: Intact Required Braces or Orthoses: Cervical Brace (for comfort) Cervical Brace: For comfort Restrictions Weight Bearing Restrictions Per Provider Order: No General   Vital Signs   Pain Pain Assessment Pain Scale: 0-10 Pain Score: 7  Pain Type: Acute pain Pain Location: Neck Pain Orientation: Left Pain Descriptors / Indicators: Aching Pain Onset: On-going Pain Intervention(s): Medication (See eMAR) ADL ADL Equipment Provided: Reacher Eating: Modified independent Where Assessed-Eating: Bed level, Chair Grooming: Supervision/safety Where Assessed-Grooming: Sitting at sink, Chair Upper Body Bathing: Supervision/safety Where Assessed-Upper Body Bathing: Shower Lower Body Bathing: Supervision/safety Where Assessed-Lower Body Bathing: Shower Upper Body Dressing: Supervision/safety Where Assessed-Upper Body Dressing: Wheelchair, Edge of bed Lower Body Dressing:  Contact guard Where Assessed-Lower Body Dressing: Edge of bed Toileting: Supervision/safety Where Assessed-Toileting: Teacher, adult education: Engineer, agricultural Method: Insurance claims handler: Distant supervision Tub/Shower Transfer Method: Unable to assess Film/video editor: Close supervision Film/video editor Method: Designer, industrial/product: Emergency planning/management officer ADL Comments: patient bought Sports administrator, shower chair, bench for getting into car Vision Baseline Vision/History: 1 Wears glasses Patient Visual Report: No change from baseline Vision Assessment?: No apparent visual deficits Perception  Perception: Within Functional Limits Praxis Praxis: WFL Cognition Cognition Overall Cognitive Status: Within Functional Limits for tasks assessed Arousal/Alertness: Awake/alert Orientation Level: Person;Place;Situation Memory: Appears intact Attention: Divided Awareness: Appears intact Problem Solving: Appears intact Safety/Judgment: Appears intact Comments: cognition affected by medicaiton at times, at times distracted by pain Brief Interview for Mental Status (BIMS) Repetition of Three Words (First Attempt): 3 Temporal Orientation: Year: Correct Temporal Orientation: Month: Accurate within 5 days Temporal Orientation: Day: Correct Recall: Sock: Yes, no cue required Recall: Blue: Yes, no cue required Recall: Bed: Yes, no cue required BIMS Summary Score: 15 Sensation Sensation Light Touch: Appears Intact Light Touch Impaired Details: Impaired LUE Proprioception: Appears Intact Stereognosis: Not tested Additional Comments: sensation to light touch okay, sensation in feeling objects due to numbness Coordination Gross Motor Movements are Fluid and Coordinated: No Fine Motor Movements are Fluid and Coordinated: Yes Coordination and Movement Description: coordination improved, impaired fine Finger Nose Finger Test: [facility] Motor   Motor Motor: Abnormal tone;Ataxia Motor - Skilled Clinical Observations: LIMITED IN SHOULDER , L side bicep and shoulder worse than Right Mobility  Bed Mobility Bed Mobility: Rolling Left;Rolling Right;Supine to Sit;Sit to Supine Rolling Right: Independent Rolling Left: Independent Supine to Sit: Independent Sit to Supine: Independent Transfers Sit to Stand: Independent with assistive device Stand to Sit: Independent with assistive device  Trunk/Postural Assessment  Cervical Assessment Cervical Assessment: Exceptions to [facility] (foreward head) Thoracic Assessment Thoracic Assessment: Exceptions to [facility] (forward shoulders due to scapular pain) Lumbar Assessment Lumbar Assessment: Within Functional Limits Postural Control Postural  Control: Within Functional Limits  Balance Balance Balance Assessed: Yes Standardized Balance Assessment Standardized Balance Assessment: Berg Balance Test Berg Balance Test Sit to Stand: Able to stand without using hands and stabilize independently Standing Unsupported: Able to stand safely 2 minutes Sitting with Back Unsupported but Feet Supported on Floor or Stool: Able to sit safely and securely 2 minutes Stand to Sit: Sits safely with minimal use of hands Transfers: Able to transfer safely, minor use of hands Standing Unsupported with Eyes Closed: Able to stand 10 seconds with supervision Standing Ubsupported with Feet Together: Able to place feet together independently and stand 1 minute safely From Standing, Reach Forward with Outstretched Arm: Can reach forward >5 cm safely (2) (pt has difficulty reaching 2/2 B UE ROM deficits, however able to bend at trunk with no LOB) From Standing Position, Pick up Object from Floor: Able to pick up shoe, needs supervision From Standing Position, Turn to Look Behind Over each Shoulder: Looks behind from both sides and weight shifts well (limited by cervical precuations) Turn 360 Degrees: Able to turn 360 degrees  safely in 4 seconds or less Standing Unsupported, Alternately Place Feet on Step/Stool: Able to stand independently and safely and complete 8 steps in 20 seconds Standing Unsupported, One Foot in Front: Able to place foot tandem independently and hold 30 seconds Standing on One Leg: Able to lift leg independently and hold > 10 seconds Total Score: 52 Static Sitting Balance Static Sitting - Balance Support: Feet supported;Right upper extremity supported Static Sitting - Level of Assistance: 7: Independent Static Standing Balance Static Standing - Balance Support: During functional activity Static Standing - Level of Assistance: 7: Independent Dynamic Standing Balance Dynamic Standing - Balance Support: Bilateral upper extremity supported Dynamic Standing - Level of Assistance: 6: Modified independent (Device/Increase time) Extremity/Trunk Assessment RUE Assessment RUE Assessment: Exceptions to [facility] Passive Range of Motion (PROM) Comments: PROM WFL Active Range of Motion (AROM) Comments: lacks shoulder flexion abduction to full ROM. at 80-90 General Strength Comments: grip 5/5 LUE Assessment LUE Assessment: Exceptions to [facility] Active Range of Motion (AROM) Comments: some scapular elevation, limited flexion, General Strength Comments: grip 4-/5   D'mariea L Fredda Clarida 03/31/2024, 11:07 AM

## 2024-03-31 NOTE — Telephone Encounter (Signed)
 Order sent.

## 2024-03-31 NOTE — Patient Care Conference (Signed)
 Inpatient RehabilitationTeam Conference and Plan of Care Update Date: 03/31/2024   Time: 1209 pm    Patient Name: Eileen Chaney      Medical Record Number: [id number]  Date of Birth: 01-27-1976 Sex: Female         Room/Bed: 4M12C/4M12C-01 Payor Info: Payor: BLUE CROSS BLUE SHIELD / Plan: BCBS COMM PPO / Product Type: *No Product type* /    Admit Date/Time:  03/19/2024 12:15 PM  Primary Diagnosis:  Cervical myelopathy [facility])  Hospital Problems: Principal Problem:   Cervical myelopathy (HCC) Active Problems:   Anxiety with depression    Expected Discharge Date: Expected Discharge Date: 04/01/24  Team Members Present: Physician leading conference: Dr. Murray Collier Social Worker Present: Rhoda Clement, LCSW Nurse Present: Eulalio Falls, RN PT Present: [name], PT OT Present: Other (comment) ANCEL Seip, OT) PPS Coordinator present : Eleanor Colon, SLP     Current Status/Progress Goal Weekly Team Focus  Bowel/Bladder   Pt continent of bowel and bladder.   Maintain continence during admission.   Toilet q 4 hours while awake and PRN.    Swallow/Nutrition/ Hydration               ADL's   Set-up-SBA overall for ADL and transfers using Rolator   mod I to SUP, SUP A to minA dressing, rec follow up OPOT, pt to order transfer tub bench, reacher, AE   discharge planning    Mobility   pt at goal level, family training complete with pt partner 7/1   supervision/mod I  moved up D/C date to 7/3. Follow up OPPT, DME: pt needs rollator, pt purchasing box step for access to higher car, and higher bed, pt purchasing RW to keep on 2nd floor to access bedroom, pt purchasing reacher, and TTB    Communication                Safety/Cognition/ Behavioral Observations               Pain   Pt c/o pain in L shoulder medicated with PRN Dilaudid .   Maintain pain score of < 3.   Assess for pain q shift and PRN.    Skin   Posterior neck incision healed.   Remain free  from s/sx of infection or breakdown.  Assess skin q shift and PRN.      Discharge Planning:  Pt did well this week and has reached her goals of supervision-mod/i. Have made referral to [facility] for OPPT and OT. ordered rollator and pt will get TTB on own. Ready for DC tomorrow    Team Discussion: Patient admitted post cervical laminectomy (ACDF) for cervical myelopathy complicated by wound debridement who continued to have ongoing neck and left UE/shoulder issues with back pain.  Central c cervical stenosis and underwent a posterior arthrodesis C 5-7 and laminectomy C 4-6 with cervical collar placement. Also limited by some component of neuropraxia of the brachial plexus exacerbating her left arm weakness versus radicular dysfunction. Significant history of anxiety/depression and ADHD with fibromyalgia.   Patient on target to meet rehab goals: yes,  currently patient needs set up assist with ADLs and transfers using rollator and at goal level with mobility. Overall goals at discharge are set for supervision-mod I assistance.  See Care Plan and progress notes for long and short-term goals.   Revisions to Treatment Plan:  Family training Reacher Neuro psych consult HTN medications adjusted   Teaching Needs: Safety, medications, transfers, toileting, etc.  Current Barriers to Discharge: Decreased caregiver support and Home enviroment access/layout  Possible Resolutions to Barriers: Family Education Ramp entry to home Outpatient follow-up DME: Rollator,walker,TTB     Medical Summary Current Status: cervical myelopathy, brachial plexus injury, pain, HTN, Constipation, Anemia  Barriers to Discharge: Medical stability;Self-care education  Barriers to Discharge Comments: cervical myelopathy, brachial plexus injury, pain, HTN, Constipation, Anemia Possible Resolutions to [employer] Focus: Pain medications adjusted today, monitor bowel function, montior BP,   Continued Need for  Acute Rehabilitation Level of Care: The patient requires daily medical management by a physician with specialized training in physical medicine and rehabilitation for the following reasons: Direction of a multidisciplinary physical rehabilitation program to maximize functional independence : Yes Medical management of patient stability for increased activity during participation in an intensive rehabilitation regime.: Yes Analysis of laboratory values and/or radiology reports with any subsequent need for medication adjustment and/or medical intervention. : Yes   I attest that I was present, lead the team conference, and concur with the assessment and plan of the team.   Eileen Chaney 03/31/2024, 1203 pm

## 2024-03-31 NOTE — Progress Notes (Signed)
 Physical Therapy Session Note  Patient Details  Name: Eileen Chaney MRN: [id number] Date of Birth: 1976/09/07  Today's Date: 03/31/2024 PT Individual Time: 0800-0900, 1102-1130 PT Individual Time Calculation (min): 60 min, 28   Short Term Goals: Week 2:  PT Short Term Goal 1 (Week 2): Pt will navigate 12 steps with unilateral handrial and CGA to access bedroom PT Short Term Goal 2 (Week 2): pt will navigate step for entry into car PT Short Term Goal 3 (Week 2): pt will provide teach back and implementation of energy conservation techniques and safety precautions  Skilled Therapeutic Interventions/Progress Updates:      Pt seated on rollator upon arrival. Pt dressed for the day. Pt agreeable to therapy. Pt denies any pain, premedicated.   Pt put in contacts with supervision, pt required assist for picking up off of the floor but otherwise able to perform independently.   Performed discharge assessment, see discharge note.   Berg Balance Assessment:  Patient demonstrates increased fall risk as noted by score of  52/56 on Berg Balance Scale.  (<36= high risk for falls, close to 100%; 37-45 significant >80%; 46-51 moderate >50%; 52-55 lower >25%)  6 Min Walk Test:  Instructed patient to ambulate as quickly and as safely as possible for 6 minutes using LRAD. Patient was allowed to take standing rest breaks without stopping the test, but if the patient required a sitting rest break the clock would be stopped and the test would be over.  Results: 1214 feet (370 meters, Avg speed 1.027m/s) using a Rollator with mod I. Results indicate that the patient has reduced endurance with ambulation compared to age matched norms.  Age Matched Norms: 72-69 yo M: 57 F: 36, 69-79 yo M: 58 F: 471, 7-89 yo M: 417 F: 392 MDC: 58.21 meters (190.98 feet) or 50 meters (ANPTA Core Set of Outcome Measures for Adults with Neurologic Conditions, 2018)  Pt provided teach back of methods to reduce fall risk  including use of rollator downstairs, RW upstairs, use of handrail and step to gait to access bed room upstairs, use of HHA from partner for navigating back door entry until ramp installed, use of CGA from partner with car transfer, and transfer to bed with use of step stool, taking seated rest breaks as needed with fatigue/lightheadedness/dizziness.   Pt provides teach back of need to call (family/911) for help in the event of a fall. Pt provides teach back of need to call 911 if pt hits her head, sustains any fractures, bleeding, etc.   Pt seated in WC at end of session with all needs within reach and chair alarm on.  Treatment Session 2   Pt seated in WC upon arrival. Pt agreeable to therapy. Pt denies any pain.   Pt ambulated in/out of elevator, over thresholds, around atrium, and outside on level and unlevel pavement with use of Rollator and supervision.   Pt very appreciative of going outside of overall mood, well-being, and quality of life.   Pt denies any questions/concerns regarding D/C home.    Therapy Documentation Precautions:  Precautions Precautions: Cervical, Fall Recall of Precautions/Restrictions: Intact Required Braces or Orthoses: Cervical Brace (for comfort) Cervical Brace: For comfort Restrictions Weight Bearing Restrictions Per Provider Order: No Balance: Balance Balance Assessed: Yes Standardized Balance Assessment Standardized Balance Assessment: Berg Balance Test Berg Balance Test Sit to Stand: Able to stand without using hands and stabilize independently Standing Unsupported: Able to stand safely 2 minutes Sitting with Back Unsupported but Feet  Supported on Floor or Stool: Able to sit safely and securely 2 minutes Stand to Sit: Sits safely with minimal use of hands Transfers: Able to transfer safely, minor use of hands Standing Unsupported with Eyes Closed: Able to stand 10 seconds with supervision Standing Ubsupported with Feet Together: Able to place  feet together independently and stand 1 minute safely From Standing, Reach Forward with Outstretched Arm: Can reach forward >5 cm safely (2) (pt has difficulty reaching 2/2 B UE ROM deficits, however able to bend at trunk with no LOB) From Standing Position, Pick up Object from Floor: Able to pick up shoe, needs supervision From Standing Position, Turn to Look Behind Over each Shoulder: Looks behind from both sides and weight shifts well (limited by cervical precuations) Turn 360 Degrees: Able to turn 360 degrees safely in 4 seconds or less Standing Unsupported, Alternately Place Feet on Step/Stool: Able to stand independently and safely and complete 8 steps in 20 seconds Standing Unsupported, One Foot in Front: Able to place foot tandem independently and hold 30 seconds Standing on One Leg: Able to lift leg independently and hold > 10 seconds Total Score: 52 Static Sitting Balance Static Sitting - Balance Support: Feet supported;Right upper extremity supported Static Sitting - Level of Assistance: 7: Independent Static Standing Balance Static Standing - Balance Support: During functional activity Static Standing - Level of Assistance: 7: Independent Dynamic Standing Balance Dynamic Standing - Balance Support: Bilateral upper extremity supported Dynamic Standing - Level of Assistance: 6: Modified independent (Device/Increase time)  Therapy/Group: Individual Therapy  [facility] [address] [name], [place], DPT  03/31/2024, 11:32 AM

## 2024-03-31 NOTE — Progress Notes (Signed)
 Patient ID: Eileen Chaney, female   DOB: 05/11/1976, 48 y.o.   MRN: [id number]  Met with pt to update regarding team conference progress toward her mod/I-supervision level goals ands discharge tomorrow. She feels ready partner has been educated and Op referral sent to [facility].

## 2024-03-31 NOTE — Progress Notes (Signed)
 Physical Therapy Discharge Summary  Patient Details  Name: Eileen Chaney MRN: [id number] Date of Birth: Mar 30, 1976  Date of Discharge from PT service:March 31, 2024   Patient has met 9 of 9 long term goals due to improved activity tolerance, improved balance, improved postural control, increased strength, increased range of motion, decreased pain, and ability to compensate for deficits.  Patient to discharge at an ambulatory level Modified Independent with Rollator, and intermittent assistance for home entry/navigating high bed and high car.  Patient's care partner is independent to provide the necessary physical assistance at discharge.  Recommendation:  Patient will benefit from ongoing skilled PT services in outpatient setting to continue to advance safe functional mobility, address ongoing impairments in strength, ROM, balance, endurance, gait, and minimize fall risk.  Equipment: Rollator, pt purchasing the following items OOP: RW for navigation of 2nd floor, box step for navigation into high bed, and and high car, TTB, reacher  Reasons for discharge: treatment goals met and discharge from hospital  Patient/family agrees with progress made and goals achieved: Yes  PT Discharge Precautions/Restrictions Precautions Precautions: Cervical;Fall Recall of Precautions/Restrictions: Intact Required Braces or Orthoses: Cervical Brace Cervical Brace: For comfort Restrictions Weight Bearing Restrictions Per Provider Order: No Pain Interference Pain Interference Pain Effect on Sleep: 1. Rarely or not at all (with pain medications) Pain Interference with Therapy Activities: 1. Rarely or not at all Pain Interference with Day-to-Day Activities: 1. Rarely or not at all Vision/Perception  Vision - History Ability to See in Adequate Light: 0 Adequate Perception Perception: Within Functional Limits Praxis Praxis: WFL  Cognition Overall Cognitive Status: Within Functional Limits for tasks  assessed Arousal/Alertness: Awake/alert Orientation Level: Oriented X4 Memory: Appears intact Awareness: Appears intact Problem Solving: Appears intact Safety/Judgment: Appears intact Comments: Cognition impacted by timing of medication - distracted by pain, or becomes lethargic with medicine however greatly improved since eval. Sensation Sensation Light Touch: Impaired by gross assessment Light Touch Impaired Details: Impaired LUE Proprioception: Impaired by gross assessment Additional Comments: pt deneis any N&T in B LE Coordination Gross Motor Movements are Fluid and Coordinated: No Fine Motor Movements are Fluid and Coordinated: No Coordination and Movement Description: L UE coordination impaired, B LE coordination improved Motor  Motor Motor: Abnormal tone;Ataxia Motor - Skilled Clinical Observations: limitied proximal movement B UE, L worse than R  Mobility Bed Mobility Bed Mobility: Rolling Left;Rolling Right;Supine to Sit;Sit to Supine Rolling Right: Independent Rolling Left: Independent Supine to Sit: Independent Sit to Supine: Independent Transfers Transfers: Sit to Stand;Stand to Sit;Stand Pivot Transfers Sit to Stand: Independent with assistive device Stand to Sit: Independent with assistive device Stand Pivot Transfers: Independent with assistive device Transfer (Assistive device): Rollator Locomotion  Gait Ambulation: Yes Gait Assistance: Independent with assistive device Gait Distance (Feet): 200 Feet Assistive device: Rollator Gait Gait: Yes Gait Pattern: Impaired Gait Pattern: Step-through pattern;Decreased dorsiflexion - left;Decreased trunk rotation Gait velocity: decreased Stairs / Additional Locomotion Stairs: Yes Stairs Assistance: Independent with assistive device Stair Management Technique: One rail Left;Two rails Number of Stairs: 20 Height of Stairs: 6 Ramp: Supervision/Verbal cueing Curb: Minimal Assistance - Patient >75% (R HHA) Pick  up small object from the floor assist level: Independent with assistive device Pick up small object from the floor assistive device: rollator and reacher Wheelchair Mobility Wheelchair Mobility: No  Trunk/Postural Assessment  Cervical Assessment Cervical Assessment: Exceptions to [facility] (cervical precuations) Thoracic Assessment Thoracic Assessment: Exceptions to [facility] (rounded shoulders) Lumbar Assessment Lumbar Assessment: Within Functional Limits Postural Control Postural Control:  Within Functional Limits  Balance Balance Balance Assessed: Yes Standardized Balance Assessment Standardized Balance Assessment: Berg Balance Test Berg Balance Test Sit to Stand: Able to stand without using hands and stabilize independently Standing Unsupported: Able to stand safely 2 minutes Sitting with Back Unsupported but Feet Supported on Floor or Stool: Able to sit safely and securely 2 minutes Stand to Sit: Sits safely with minimal use of hands Transfers: Able to transfer safely, minor use of hands Standing Unsupported with Eyes Closed: Able to stand 10 seconds with supervision Standing Ubsupported with Feet Together: Able to place feet together independently and stand 1 minute safely From Standing, Reach Forward with Outstretched Arm: Can reach forward >5 cm safely (2) (pt has difficulty reaching 2/2 B UE ROM deficits, however able to bend at trunk with no LOB) From Standing Position, Pick up Object from Floor: Able to pick up shoe, needs supervision From Standing Position, Turn to Look Behind Over each Shoulder: Looks behind from both sides and weight shifts well (limited by cervical precuations) Turn 360 Degrees: Able to turn 360 degrees safely in 4 seconds or less Standing Unsupported, Alternately Place Feet on Step/Stool: Able to stand independently and safely and complete 8 steps in 20 seconds Standing Unsupported, One Foot in Front: Able to place foot tandem independently and hold 30  seconds Standing on One Leg: Able to lift leg independently and hold > 10 seconds Total Score: 52 Static Sitting Balance Static Sitting - Balance Support: Feet supported;Right upper extremity supported Static Sitting - Level of Assistance: 7: Independent Static Standing Balance Static Standing - Balance Support: During functional activity Static Standing - Level of Assistance: 7: Independent Dynamic Standing Balance Dynamic Standing - Balance Support: Bilateral upper extremity supported Dynamic Standing - Level of Assistance: 6: Modified independent (Device/Increase time) Extremity Assessment  RLE Assessment RLE Assessment: Exceptions to [facility] General Strength Comments: grossly 4/5 LLE Assessment LLE Assessment: Exceptions to [facility] General Strength Comments: grossly 4/5 in sititng position  [facility], PT, DPT  03/31/2024, 8:01 AM

## 2024-03-31 NOTE — Progress Notes (Signed)
 Occupational Therapy Session Note  Patient Details  Name: Eileen Chaney MRN: [id number] Date of Birth: 12/28/1975  Session 1: Today's Date: 03/31/2024 OT Individual Time: 1003-1100 OT Individual Time Calculation (min): 57 min   Session 2: Today's Date: 03/31/2024 OT Individual Time: 8566-8466 OT Individual Time Calculation (min): 60 min    Short Term Goals: Week 2:  OT Short Term Goal 1 (Week 2): STG = LTG (d/t ELOS)   Session 1: Skilled Therapeutic Interventions/Progress Updates:    Patient agreeable to participate in OT session. Reports 6./10 pain level, given medication by RN.   Patient participated in skilled OT session focusing on ADL independence/ participation, home set up, home safety, Therapist educated patient on mobility, completed trials of AE to don UE clothing. Patient completed UE donning with sup for safety on EOB. Patient able to demonstrate increased strength and complete functional use of LUE with trace to 2- amount seen in elbow flexors which is increase from previous session. Patient able to demonstrate increased cognition and mental focus with divided attention between nursing and therapy demonstrating improvement from previous sessions. Therapist facilitated increased UE mobility utilizing verbal and tactile cues.   Session 2: Skilled Therapeutic Interventions/Progress Updates:    Patient agreeable to participate in OT session. Reports 4/10 pain level pain medication administered during session. Patient completed IADL trial in kitchen to demonstrate safety in home with simple daily tasks. Planned household tasks with son to maximize safety. Discussed ergonomics with laptop. Patient and therapist discussed HEP for UE strengthening and scapular strengthening to increase UE mobility. Patient understood levels and agreed with discharge planning.   Therapy Documentation Precautions:  Precautions Precautions: Cervical, Fall Recall of Precautions/Restrictions:  Intact Required Braces or Orthoses: Cervical Brace Cervical Brace: For comfort Restrictions Weight Bearing Restrictions Per Provider Order: No   Therapy/Group: Individual Therapy  D'mariea L Leonardo Makris OTR/L  03/31/2024, 8:58 AM

## 2024-03-31 NOTE — Progress Notes (Signed)
 Inpatient Rehabilitation Discharge Medication Review by a Pharmacist  A complete drug regimen review was completed for this patient to identify any potential clinically significant medication issues.  High Risk Drug Classes Is patient taking? Indication by Medication  Antipsychotic No   Anticoagulant No   Antibiotic No   Opioid Yes Hydrocmorphone for acute pain   Antiplatelet No   Hypoglycemics/insulin  Yes  Tirzepatide- DM   Vasoactive Medication No Minoxidil  and spironolactone  for HTN   Chemotherapy No   Other Yes Atorvastatin  for HLD Elavil  - mood/neuropathy  Malatonin- insomnia  Singulair - asthma  Pantoprazole  - reflux  PEG, psyllium, docusate, senna- bowel care  Fluticasone  NS, cetirizine for allergies  Pregabalin  for nerve pain  Desvenlafaxine for depression  Methylphenidate  for ADHD Hydroxyzine  for sleep  Vitamin D  for supplementation Methotrexate/folic acid - idiopathic urticaria  Methocarbamol , Diazepam  for muscle spasms     Type of Medication Issue Identified Description of Issue Recommendation(s)  Drug Interaction(s) (clinically significant)     Duplicate Therapy     Allergy     No Medication Administration End Date     Incorrect Dose     Additional Drug Therapy Needed     Significant med changes from prior encounter (inform family/care partners about these prior to discharge). HELD home BC powder, , gabapentin  PRN, naltrexone, naproxen, orphenadrine prn muscle spasms  INCREASED pregabalin   NEW folic acid , methocarbamol , elavil   Review at discharge   Other       Clinically significant medication issues were identified that warrant physician communication and completion of prescribed/recommended actions by midnight of the next day:  No  Name of provider notified for urgent issues identified:   Provider Method of Notification:     Pharmacist comments:   Time spent performing this drug regimen review (minutes):  20  Massie Fila, PharmD Clinical  Pharmacist  03/31/2024 9:03 AM

## 2024-03-31 NOTE — Telephone Encounter (Addendum)
 I spoke with Rosaline at [facility]. She can work her in on 8/5 at 1pm. We just need to send an order. I told them we would want bilateral.  I notified the patient of Edsel and Dr Theressa recommendation and of the above appointment. She is agreeable to this plan.  Should she keep the appointment for 7/29 with Dr Clois? Or does she need to see you next week now that she is home? I told her we would call her if we needed to see her sooner than 7/29.

## 2024-04-01 ENCOUNTER — Ambulatory Visit (INDEPENDENT_AMBULATORY_CARE_PROVIDER_SITE_OTHER)

## 2024-04-01 VITALS — Temp 98.1°F

## 2024-04-01 DIAGNOSIS — G959 Disease of spinal cord, unspecified: Secondary | ICD-10-CM

## 2024-04-01 DIAGNOSIS — N319 Neuromuscular dysfunction of bladder, unspecified: Secondary | ICD-10-CM

## 2024-04-01 DIAGNOSIS — Z981 Arthrodesis status: Secondary | ICD-10-CM

## 2024-04-01 DIAGNOSIS — Z09 Encounter for follow-up examination after completed treatment for conditions other than malignant neoplasm: Secondary | ICD-10-CM

## 2024-04-01 NOTE — Telephone Encounter (Signed)
 Eileen Chaney came to clinic today for a nurse visit. I confirmed she does not have external stitches that needed to be removed.

## 2024-04-01 NOTE — Progress Notes (Signed)
 Inpatient Rehabilitation Care Coordinator Discharge Note   Patient Details  Name: Eileen Chaney MRN: [id number] Date of Birth: [birth year]   Discharge location: HOME WITH PARTNER WITH DAUGHTER TO ALSO ASSIST  Length of Stay: 13 DAYS  Discharge activity level: MOD/I-SUPERVISION LEVEL  Home/community participation: ACTIVE  Patient response un:Yzjouy Literacy - How often do you need to have someone help you when you read instructions, pamphlets, or other written material from your doctor or pharmacy?: Never  Patient response un:Dnrpjo Isolation - How often do you feel lonely or isolated from those around you?: Never  Services provided included: MD, RD, PT, OT, RN, CM, TR, Pharmacy, Neuropsych, SW  Financial Services:  Field seismologist Utilized: [employer]  Choices offered to/list presented to: PT  Follow-up services arranged:  Outpatient, DME, Patient/Family has no preference for HH/DME agencies    Outpatient Servicies: [facility]-OPPT & OT WILL CALL TO SET UP FOLLOW UP APPOINTMENTS DME : [facility]  ROLLATOR    Patient response to transportation need: Is the patient able to respond to transportation needs?: Yes In the past 12 months, has lack of transportation kept you from medical appointments or from getting medications?: No In the past 12 months, has lack of transportation kept you from meetings, work, or from getting things needed for daily living?: No   Patient/Family verbalized understanding of follow-up arrangements:  Yes  Individual responsible for coordination of the follow-up plan: Eileen Chaney [phone]  Confirmed correct DME delivered: Eileen Chaney 04/01/2024    Comments (or additional  ):PT DID WELL AND IS RECOVERING FROM HER BACK SURGERY. FEELS READY TO GO HOME. PARTNER WAS IN FOR EDUCATION ON CAR TRANSFERS AND STEPS  Summary of Stay    Date/Time Discharge Planning CSW  03/31/24 1018 Pt did well this week and has reached her goals of  supervision-mod/i. Have made referral to [facility] for OPPT and OT. ordered rollator and pt will get TTB on own. Ready for DC tomorrow RGD  03/24/24 0953 Home with partner who can be there along with her daughter. Will give letter for ramp to be placed into home. Will await team's recommendations. Neuro-psych to see this week RGD       Nicolette Gieske G

## 2024-04-01 NOTE — Progress Notes (Signed)
 Physical Therapy Session Note  Patient Details  Name: Eileen Chaney MRN: [id number] Date of Birth: Feb 22, 1976  Today's Date: 04/01/2024 PT Individual Time: 1104-1130 PT Individual Time Calculation (min): 26 min   Short Term Goals: Week 2:  PT Short Term Goal 1 (Week 2): Pt will navigate 12 steps with unilateral handrial and CGA to access bedroom PT Short Term Goal 2 (Week 2): pt will navigate step for entry into car PT Short Term Goal 3 (Week 2): pt will provide teach back and implementation of energy conservation techniques and safety precautions  Skilled Therapeutic Interventions/Progress Updates:      Pt seated on rollator upon arrival. Pt agreeable to therapy. Pt deneis any pain.   Pt present to assist pt to car 2/2 box step required for 33 inch high vehicle, however pt not yet ready to D/C 2/2 awaiting delivery of rollator. PT notified social worker of rollator.   While awaiting delivery of rollator, pt and caregiver trialed ambulatory transfer to car simulator with plastic box step (pt purchased until box step PT recommended arrives at pt house this evening.)  Pt performed with supervision however therapist and caregiver concered with intergrity of step.   Therapist provided education to nursing staff of method of transfer into pt car for safety utilizing rehab unit box step. Nurse checked off to assist with pt into car upon discharge.   Therapy Documentation Precautions:  Precautions Precautions: Cervical, Fall Recall of Precautions/Restrictions: Intact Required Braces or Orthoses: Cervical Brace (for comfort) Cervical Brace: For comfort Restrictions Weight Bearing Restrictions Per Provider Order: No  Therapy/Group: Individual Therapy  [facility] [name], [place], DPT  04/01/2024, 12:40 PM

## 2024-04-01 NOTE — Plan of Care (Signed)
  Problem: RH Balance Goal: LTG Patient will maintain dynamic standing balance (PT) Description: LTG:  Patient will maintain dynamic standing balance with assistance during mobility activities (PT) Outcome: Completed/Met   Problem: Sit to Stand Goal: LTG:  Patient will perform sit to stand with assistance level (PT) Description: LTG:  Patient will perform sit to stand with assistance level (PT) Outcome: Completed/Met   Problem: RH Bed Mobility Goal: LTG Patient will perform bed mobility with assist (PT) Description: LTG: Patient will perform bed mobility with assistance, with/without cues (PT). Outcome: Completed/Met   Problem: RH Bed to Chair Transfers Goal: LTG Patient will perform bed/chair transfers w/assist (PT) Description: LTG: Patient will perform bed to chair transfers with assistance (PT). Outcome: Completed/Met   Problem: RH Car Transfers Goal: LTG Patient will perform car transfers with assist (PT) Description: LTG: Patient will perform car transfers with assistance (PT). Outcome: Completed/Met   Problem: RH Ambulation Goal: LTG Patient will ambulate in controlled environment (PT) Description: LTG: Patient will ambulate in a controlled environment, # of feet with assistance (PT). Outcome: Completed/Met Goal: LTG Patient will ambulate in home environment (PT) Description: LTG: Patient will ambulate in home environment, # of feet with assistance (PT). Outcome: Completed/Met Goal: LTG Patient will ambulate in community environment (PT) Description: LTG: Patient will ambulate in community environment, # of feet with assistance (PT). Outcome: Completed/Met   Problem: RH Stairs Goal: LTG Patient will ambulate up and down stairs w/assist (PT) Description: LTG: Patient will ambulate up and down # of stairs with assistance (PT) Outcome: Completed/Met

## 2024-04-01 NOTE — Progress Notes (Signed)
 PROGRESS NOTE   Subjective/Complaints: Patient had some increased pain today.  Pain overall controlled with oxycodone .  ROS: Patient denies fever, new rash,  nausea, vomiting, diarrhea,constipation,  shortness of breath or chest pain, headache, dizziness, or mood change   Objective:   No results found. No results for input(s): WBC, HGB, HCT, PLT in the last 72 hours.   No results for input(s): NA, K, CL, CO2, GLUCOSE, BUN, CREATININE, CALCIUM  in the last 72 hours.   No intake or output data in the 24 hours ending 04/01/24 1723       Physical Exam: Vital Signs Blood pressure 118/60, pulse 86, temperature 98 F (36.7 C), temperature source Oral, resp. rate 18, height 5' 8 (1.727 m), weight 108.3 kg, SpO2 98%.  Physical Exam Constitutional: No apparent distress.  Sitting bedside chair HENT: No JVD, mucous membranes a little dry Cardiovascular: Regular rate and rhythm Respiratory: CTAB.  No increased work of breathing Psych: Appropriate, cooperative abdomen: + bowel sounds, normoactive. No distention or tenderness.  Skin: C/D/I. No apparent lesions.  Surgical incision dry, intact.   MSK:      No apparent deformity. + LUE sling for comfort      + TTP L posterior shoulder - no palpable deformity-unchanged   Neurologic exam: Alert and awake follows commands, cranial nerves II through XII grossly intact, EOMI Sensation: To light touch intact in BL LE; LUE diffusely reduced compared to LUE; absent digits 1-3     Strength:                RUE: 2/5 SA, 5-/5 EF, 5-/5 EE, 5-/5 WE, 5-/5 FF, 5-/5 FA                LUE:  1/5 SA, 1/5 EF, 3/5 EE, 4-/5 WE, 4/5 FF, 4/5 FA                RLE: 5/5 HF, 5/5 KE, 5/5  DF, 5/5  EHL, 5/5  PF                 LLE:  5/5 HF, 5/5 KE, 5/5  DF, 5/5  EHL, 5/5  PF  Physical exam unchanged from the above on reexamination 04/01/24    Assessment/Plan: 1. Functional deficits  which require 3+ hours per day of interdisciplinary therapy in a comprehensive inpatient rehab setting. Physiatrist is providing close team supervision and 24 hour management of active medical problems listed below. Physiatrist and rehab team continue to assess barriers to discharge/monitor patient progress toward functional and medical goals  Care Tool:  Bathing    Body parts bathed by patient: Chest, Abdomen, Right upper leg, Left upper leg, Right lower leg, Left lower leg, Face, Front perineal area, Right arm, Left arm, Buttocks   Body parts bathed by helper: Right arm, Left arm, Buttocks     Bathing assist Assist Level: Supervision/Verbal cueing     Upper Body Dressing/Undressing Upper body dressing   What is the patient wearing?: Pull over shirt    Upper body assist Assist Level: Set up assist    Lower Body Dressing/Undressing Lower body dressing      What is the patient wearing?:  Pants     Lower body assist Assist for lower body dressing: Contact Guard/Touching assist     Toileting Toileting Toileting Activity did not occur (Clothing management and hygiene only): N/A (no void or bm)  Toileting assist Assist for toileting: Supervision/Verbal cueing     Transfers Chair/bed transfer  Transfers assist     Chair/bed transfer assist level: Independent with assistive device     Locomotion Ambulation   Ambulation assist      Assist level: Independent with assistive device Assistive device: Rollator Max distance: 1000+   Walk 10 feet activity   Assist     Assist level: Independent with assistive device Assistive device: Rollator   Walk 50 feet activity   Assist    Assist level: Independent with assistive device Assistive device: Rollator    Walk 150 feet activity   Assist    Assist level: Independent with assistive device Assistive device: Rollator    Walk 10 feet on uneven surface  activity   Assist Walk 10 feet on uneven  surfaces activity did not occur: Safety/medical concerns   Assist level: Supervision/Verbal cueing Assistive device: Rollator (or HHA without AD)   Wheelchair     Assist Is the patient using a wheelchair?: No             Wheelchair 50 feet with 2 turns activity    Assist            Wheelchair 150 feet activity     Assist          Blood pressure 118/60, pulse 86, temperature 98 F (36.7 C), temperature source Oral, resp. rate 18, height 5' 8 (1.727 m), weight 108.3 kg, SpO2 98%.   Medical Problem List and Plan: 1. Functional deficits secondary to cervical myelopathy status post C3-7 PSF, C4-6 decompression 03/15/2024 with postoperative left upper extremity weakness as well as history of cervical laminectomy 01/07/2022 as well as 11/12/2021.  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 Pt has called neurosurgery about getting this scheduled. Today we discussed realistic expectations with this study.  - Discussed with therapy, potential discharge Wednesday - DC home today -follow-up outpatient neurosurgery    2.  Antithrombotics: -DVT/anticoagulation:  Pharmaceutical: Lovenox  40mg  daily, vascular study neg 03/21/24             -antiplatelet therapy: N/A 3. Pain Management: Lyrica  150 mg 3 times daily, Dilaudid  2 mg every 4 hours as moderate pain needed and 4 mg every 4 hours as needed severe pain, Robaxin  1000 mg every 6  hours, Valium  as needed muscle spasms              - Patient with slight allergy to dilaudid , has benadryll 35 mg Q6H PRN  - Poorly controlled L posteiro shoulder neuropathic pain - added elavil  25 mg at bedtime for  pain/sleep + oxycodone  5 mg TID (no allergy prior, scheduled before therapies/bed). Did fail pain management with oxycodone  IR prior to dilaudid .  -03/20/24 pain management still an issue, mostly related to doses not coming on time -changed scheduled oxy 5mg  IR to 0600/1300/2100 to have it on off-hours but related to PT schedule and sleep schedule.  -ordered Kpad -Lidoderm  patches for L shoulder -call on call provider if pain control issues-- might change oxy to SR form -6/28-29/25 pain doing much better overall 6-30: Reduce Dilaudid  from 2 to 4 mg every 6 hours as needed to 1 to 2 mg every 6 hours as needed, per patient request to start weaning pain medication 7/2 Change dilaudid  PRN to oxycodone  5-10 mg PRN, continue scheduled oxycodone  for now 7/3 continue oxycodone  5-10mg  as needed for pain control  4. Mood/Behavior/Sleep/ADHD: Ritalin  20 mg every morning, Effexor  75 mg daily, Atarax  50 mg nightly as needed sleep,              -antipsychotic agents: N/A              - added elavil  25 mg at bedtime -03/20/24 added melatonin 5mg  nightly-- home med too -6/27 patient uses hydroxyzine  for insomnia, if poor sleep continues to be an issue could consider trying a different agent 6-30: Poor sleep related to left shoulder discomfort, does not want more sedating medications on board   5. Neuropsych/cognition: This patient is capable of making decisions on her own behalf. 6. Skin/Wound Care: Routine skin checks 7. Fluids/Electrolytes/Nutrition: Routine in and outs with follow-up chemistries 8.  Hypertension.  Aldactone  100 mg twice daily, minoxidil  2.5 mg daily.  Monitor with increased mobility -6/23 decrease Aldactone  to 25 mg twice daily due to relative hypotension -6/24 will stop  aldactone  and menoxidil- this was for hair growth -6/27 BP stable, continue current regimen and monitor -03/27/24 BP elevated last night and day before, but otherwise stable in between; monitor for now.  - 7/1-3 BP controlled continue to monitor Vitals:   03/28/24 1655 03/28/24 1931 03/29/24 0549 03/29/24 1710  BP: 121/82 122/81 107/74 124/79   03/29/24 1939 03/30/24 0615 03/30/24 1418 03/30/24 2021  BP: 117/74 119/69 130/78 129/78   03/31/24 0626 03/31/24 1447 03/31/24 1957 04/01/24 0607  BP: 123/80 (!) 132/93 113/73 118/60      9.  Constipation.  Colace 100 mg twice daily, MiraLAX  daily as needed -03/20/24 no BM in 2 days per pt, but doesn't want to adjust meds yet-- if no BM by tomorrow, adjust further -03/21/24 still no BM, increase colace 200mg  BID, add psyllium per pt preference; monitor, if no BM tomorrow, would do sorbitol  -6/23 patient got sorbitol  today monitor for response, increase Senokot to 2 tabs  -6/24 1/2 container bowel prep, enema/suppository if no results-ordered PRN  -6/25 LBM yesterday, improved  -6/26 Schedule miralax  daily, continue Senokot -6/27 LBM 6/24, she denies feeling constipated, will add 2nd dose miralax  prn, consider additional medication if no bowel movement by tomorrow -7/2 LBM 6/30, continue to monitor   10.  Diabetes mellitus.  Hemoglobin A1c 5.8.  Blood sugars 103-94-106-134.  Patient on Mounjaro prior to admission and resume at discharge. No need for SSI/CBG checks.  Glucose stable on BMP 03/29/24   11.  Hyperlipidemia.  Lipitor 40 mg daily 12.  History of asthma.  Singulair  10 mg daily.  Claritin  10mg  daily. Flonase  2 sprays nightly. Check oxygen saturations every shift 13.  Class I obesity.  BMI 35.28.  Dietary follow-up  14. Hx  b/l CTS surgery; LUE weakness s/p PSF. L digit 1-3 numbness on exam, recommend OP EMG/ncs on discharge. 15. Vit D Deficiency: vit D supp 50k U weekly 16. ABL Anemia: Hgb 10.1 on admission, monitor weekly -- stable  - 7/1  hemoglobin overall stable at 9.4 yesterday 17.  Leukocytosis: likely from steroid, monitor weekly, monitor for s/sx of infection             - 6/23 improved to 11.1 from 12.3.  Monitor for signs of infection  -7/1 WBC within normal range at 9.4 yesterday 18.  Mild tachycardia, improved             - Continue to monitor trend for now, encourage oral fluids  -Ritalin  may be contributing- she says this was started for fibromyalgia  -HR mostly in the 80s and 90s, improved    04/01/2024    6:07 AM 03/31/2024    7:57 PM 03/31/2024    2:47 PM  Vitals with BMI  Systolic 118 113 867  Diastolic 60 73 93  Pulse 86 92 88    19. Orthostatic hypotension  -Encourage fluids, adjust medications as above  -ABD binder/TEDs  -see above #8  -Improved, denies recent symptoms  20. Possible neurogenic bladder  -PVR/Bladder scan-  -6/25-26 mildly elevated PVRs, not needing IC yet, discussed timed voids/double voids. Hold off on flomax due to hypotension. U/A does not indicate likely UTI  6/27 PVRs 89, 216, 0, consider discontinuing tomorrow if stable -03/28/24 PVRs 0 since yesterday morning, will d/c these, but pt with hesitancy and urgency still, would like U/A rechecked-- ordered for today. F/up as available. --With rare bacteria, negative leukocyte esterase or nitrates, low likelihood of infection - Improved, remains continent  21. Dry mouth  - Biotene mouthwash  -6/27 improved, continue to monitor  22. Dyspepsia, improved   -Start protonix  40mg  daily  -6/27 Discussed maalox PRN  LOS: 13 days A FACE TO FACE EVALUATION WAS PERFORMED  Murray Collier 04/01/2024, 5:23 PM

## 2024-04-01 NOTE — Plan of Care (Signed)
  Problem: RH Balance Goal: LTG Patient will maintain dynamic standing with ADLs (OT) Description: LTG:  Patient will maintain dynamic standing balance with assist during activities of daily living (OT)  Outcome: Completed/Met   Problem: RH Eating Goal: LTG Patient will perform eating w/assist, cues/equip (OT) Description: LTG: Patient will perform eating with assist, with/without cues using equipment (OT) Outcome: Completed/Met   Problem: RH Grooming Goal: LTG Patient will perform grooming w/assist,cues/equip (OT) Description: LTG: Patient will perform grooming with assist, with/without cues using equipment (OT) Outcome: Completed/Met   Problem: RH Bathing Goal: LTG Patient will bathe all body parts with assist levels (OT) Description: LTG: Patient will bathe all body parts with assist levels (OT) Outcome: Completed/Met   Problem: RH Dressing Goal: LTG Patient will perform upper body dressing (OT) Description: LTG Patient will perform upper body dressing with assist, with/without cues (OT). Outcome: Completed/Met Goal: LTG Patient will perform lower body dressing w/assist (OT) Description: LTG: Patient will perform lower body dressing with assist, with/without cues in positioning using equipment (OT) Outcome: Completed/Met   Problem: RH Toileting Goal: LTG Patient will perform toileting task (3/3 steps) with assistance level (OT) Description: LTG: Patient will perform toileting task (3/3 steps) with assistance level (OT)  Outcome: Completed/Met   Problem: RH Toilet Transfers Goal: LTG Patient will perform toilet transfers w/assist (OT) Description: LTG: Patient will perform toilet transfers with assist, with/without cues using equipment (OT) Outcome: Completed/Met   Problem: RH Tub/Shower Transfers Goal: LTG Patient will perform tub/shower transfers w/assist (OT) Description: LTG: Patient will perform tub/shower transfers with assist, with/without cues using equipment  (OT) Outcome: Completed/Met

## 2024-04-01 NOTE — Progress Notes (Signed)
 C3-7 PSF, C4-6 decompression on 03/15/24  Eileen Chaney presented to clinic today for an incision check. There was some discussion at inpatient rehab regarding whether or not she had external sutures that needed to be removed even though her operative report stated her sutures were monocryl.   Upon inspection, her incision is clean, dry, and intact with no visible external sutures. I informed the patient and her father that if they see any internal sutures work their way to the surface, they can contact our office for removal.   I provided her with the phone number to [facility] PT/OT. She will contact them for an appointment.  She will follow up with our office as scheduled on 04/27/24, but she is aware to contact us  with any questions or concerns in the interim.     Danaysha Kirn, RN [facility] at [facility]

## 2024-04-06 ENCOUNTER — Ambulatory Visit: Attending: Physician Assistant

## 2024-04-06 ENCOUNTER — Ambulatory Visit

## 2024-04-06 DIAGNOSIS — R262 Difficulty in walking, not elsewhere classified: Secondary | ICD-10-CM | POA: Diagnosis present

## 2024-04-06 DIAGNOSIS — R2681 Unsteadiness on feet: Secondary | ICD-10-CM | POA: Insufficient documentation

## 2024-04-06 DIAGNOSIS — G959 Disease of spinal cord, unspecified: Secondary | ICD-10-CM

## 2024-04-06 DIAGNOSIS — M542 Cervicalgia: Secondary | ICD-10-CM | POA: Insufficient documentation

## 2024-04-06 DIAGNOSIS — M6281 Muscle weakness (generalized): Secondary | ICD-10-CM

## 2024-04-06 DIAGNOSIS — R278 Other lack of coordination: Secondary | ICD-10-CM | POA: Diagnosis present

## 2024-04-06 NOTE — Therapy (Unsigned)
 OUTPATIENT OCCUPATIONAL THERAPY NEURO EVALUATION  Patient Name: Eileen Chaney MRN: [id number] DOB:[birth year], 48 y.o., female Today's Date: 04/07/2024  PCP: Dr. Alm Needle REFERRING PROVIDER: Toribio Pitch, PA-C  END OF SESSION:  OT End of Session - 04/07/24 1943     Visit Number 1    Number of Visits 24    Date for OT Re-Evaluation 06/29/24    OT Start Time 0930    OT Stop Time 1015    OT Time Calculation (min) 45 min    Activity Tolerance Patient tolerated treatment well    Behavior During Therapy WFL for tasks assessed/performed         Past Medical History:  Diagnosis Date   Anxiety    Arthritis    Asthma    Cervical myelopathy with cervical radiculopathy (HCC)    Complication of anesthesia    with c sections had a had time getting her sedated   DDD (degenerative disc disease), cervical    Depression    Diabetes mellitus without complication (HCC)    type 2   Family history of adverse reaction to anesthesia    mother hard to wake up   Fibromyalgia    Hyperlipidemia    Hypertension    Idiopathic urticaria    Pneumonia    PONV (postoperative nausea and vomiting)    Undifferentiated inflammatory arthritis    Past Surgical History:  Procedure Laterality Date   ABDOMINAL HYSTERECTOMY  2018   uterine fibroids   ANKLE SURGERY Right 2022   ligament repair   ANTERIOR CERVICAL DECOMP/DISCECTOMY FUSION N/A 11/12/2021   Procedure: C3-5 ANTERIOR CERVICAL DISCECTOMY AND FUSION (GLOBUS HEDRON);  Surgeon: Clois Fret, MD;  Location: [facility];  Service: Neurosurgery;  Laterality: N/A;   CERVICAL WOUND DEBRIDEMENT N/A 01/20/2022   Procedure: posterior wound debridment;  Surgeon: Clois Fret, MD;  Location: [facility];  Service: Neurosurgery;  Laterality: N/A;   CESAREAN SECTION     X3 2000, 2001, 2009   DILATION AND CURETTAGE OF UTERUS     POSTERIOR CERVICAL FUSION/FORAMINOTOMY N/A 03/15/2024   Procedure: POSTERIOR CERVICAL FUSION/FORAMINOTOMY LEVEL  4;  Surgeon: Clois Fret, MD;  Location: [facility];  Service: Neurosurgery;  Laterality: N/A;  C4-7 Posterior Spinal Decompression C4-7 Posterior Spinal Instrumentation C5-7 Fusion   POSTERIOR CERVICAL LAMINECTOMY N/A 01/07/2022   Procedure: OPEN C5-7 POSTERIOR DECOMPRESSION;  Surgeon: Clois Fret, MD;  Location: [facility];  Service: Neurosurgery;  Laterality: N/A;   TONSILLECTOMY  2000   WISDOM TOOTH EXTRACTION     Patient Active Problem List   Diagnosis Date Noted   Anxiety with depression 03/24/2024   Cervical myelopathy (HCC) 03/15/2024   Spinal stenosis in cervical region 03/15/2024   Cervical radiculopathy 03/15/2024   S/P cervical spinal fusion 03/15/2024   Wound infection after surgery 01/20/2022   Gestational diabetes 06/02/2020   Arthritis 10/11/2019   DDD (degenerative disc disease), lumbar 10/11/2019   Hyperlipidemia 10/11/2019   Idiopathic urticaria 10/11/2019   Recurrent major depressive disorder, in partial remission (HCC) 10/11/2019   Spinal stenosis of lumbar region without neurogenic claudication 10/11/2019   Type 2 diabetes mellitus without complication, with long-term current use of insulin  (HCC) 10/11/2019   Elevated lactic acid level 09/29/2017   Leukocytosis 09/29/2017   Diabetes (HCC) 09/28/2017   Hypertension 09/28/2017   Mild asthma without complication 09/28/2017   Morbid obesity (HCC) 09/28/2017   Thalassemia 09/28/2017   GAD (generalized anxiety disorder) 07/25/2014   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, unspecified 03/14/2014  ONSET DATE: 03/15/24  REFERRING DIAG:  G95.9 (ICD-10-CM) - Cervical myelopathy (HCC)  Z98.1 (ICD-10-CM) - S/P cervical spinal fusion   THERAPY DIAG:  Muscle weakness (generalized)  Other lack of coordination  Cervical myelopathy (HCC)  Rationale for Evaluation and Treatment: Rehabilitation  SUBJECTIVE:  SUBJECTIVE STATEMENT: Pt hopeful to return to light duty at work near the end of the  Summer. Pt accompanied by: [name]  PERTINENT HISTORY: Per chart: CIR d/c summary: Eileen Chaney is a 48 y.o. right-handed female with history significant for anxiety/depression as well as component of ADHD maintained on Ritalin , asthma, class I obesity with BMI 35.28 diabetes mellitus fibromyalgia hyperlipidemia hypertension posterior cervical laminectomy 01/07/2022, anterior cervical decompressive discectomy fusion 11/12/2021 complicated by cervical wound debridement 01/20/2022.  Per chart review patient lives with daughter.  She has good support from her family.  Two-level home bed and bath on main level independent community ambulator.  [facility] with increasing difficulty with dexterity in her hands as well as poor balance.  She was having trouble using her phone as well as difficulty with buttons and clasping her necklaces.  She continued to have ongoing neck and left arm and shoulder pain with numbness as well as low back pain that extends into her buttocks.  She had denied any recent falls.  Recent imaging showed severe central canal stenosis at C4-5 C5-6 with foraminal stenosis from C3-7.  Most recent preoperative labs showed a hemoglobin of 12.8 hematocrit 40.3 on 03/03/2024 and unremarkable chemistries.  Underwent posterior segmental instrumentation of C3-7 as well as posterior lateral arthrodesis from C5-7 with cervical laminectomy from C4-C6 for decompression of the spinal cord with bilateral foraminotomies and facetectomies at C3/4 C4-5 C5-6 and left foraminotomies C6-7 03/15/2024 per Dr. Reeves Nine.  Laced in a cervical brace for comfort.  She did complete a course of Decadron  taper.  Patient control with pain with the use of Dilaudid  as well as Valium .  Lovenox  added for DVT prophylaxis 03/16/2024.  Follow-up imaging CT/MRI cervical spine 03/16/2024 due to complaints of increasing neck pain and acute left arm weakness showed noted degenerative changes moderate to  severe spinal canal stenosis C3-4 with mild cord compression without definitive cord signal abnormality.  Multilevel foraminal stenosis greatest and severe bilaterally at C3-4 C5-6 on the left at C6-7.  Moderate to severe spinal canal stenosis on the right at C6-7.  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  Therapy evaluations completed due to patient decreased functional mobility was admitted for a comprehensive rehab program.   PRECAUTIONS: Cervical, wears soft cervical collar prn   WEIGHT BEARING RESTRICTIONS: Yes ; no lifting >10 lbs  PAIN:  Are you having pain? Yes: NPRS scale: 4-5/10 pain at rest, 10/10 with activity Pain location: neck, shoulders down to hands Pain description: nerve pain Aggravating factors: walking, sitting, standing, moving L arm  Relieving factors: reclining  FALLS: Has patient fallen in last 6 months? 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   OBJECTIVE:  Note: Objective measures were completed at Evaluation unless otherwise noted.  HAND DOMINANCE: Right  ADLs: Overall ADLs: Caregiver assist for ADLs Transfers/ambulation related to ADLs: using rollator at home for balance and to take pressure off shoulders and back Eating: difficulty cutting food Grooming: Max A with hair care d/t  BUE weakness UB Dressing: Limited engagement of LUE LB Dressing: Difficulty hiking pants d/t limited engagement of the LUE Toileting: modified indep using R dominant/less affected arm Bathing: supv for tub/shower transfers; assist to wash upper R side of body; reports her long handled sponge is ineffective Tub Shower transfers: transfer tub bench in place but able to step over shower with supv and sits on bench prn throughout shower Equipment: Transfer tub bench  IADLs: Shopping: dep Light housekeeping: Max A; pt reports she can load washing machine but is unable to load/unload dishwasher d/t the bending required Meal Prep: Partner managing meals Community mobility: increased pain/limited Medication management: indep Financial management: indep  POSTURE COMMENTS:  rounded shoulders and forward head  ACTIVITY TOLERANCE: Activity tolerance: Limited tolerance for sitting and standing; more comfortable in supine (performed eval partially supine on mat to manage pain)  UPPER EXTREMITY ROM:    Active ROM Right eval Left eval  Shoulder flexion 55 30  Shoulder abduction 55 20  Shoulder adduction    Shoulder extension    Shoulder internal rotation WNL Unable to attempt behind back  Shoulder external rotation -20 Unable to attempt behind head  Elbow flexion    Elbow extension    Wrist flexion    Wrist extension    Wrist ulnar deviation    Wrist radial deviation    Wrist pronation    Wrist supination    (Blank rows = not tested)  UPPER EXTREMITY MMT:     MMT Right eval Left eval  Shoulder flexion 2+ 2-  Shoulder abduction 2+ 2-  Shoulder adduction    Shoulder extension    Shoulder internal rotation 4 2+  Shoulder external rotation 3- 2  Middle trapezius    Lower trapezius    Elbow flexion 4+ 1 to 2- (will assess more thoroughly next session)  Elbow extension 5 4  Wrist flexion 5 4  Wrist extension 5 4  Wrist ulnar deviation    Wrist radial deviation    Wrist  pronation 5 3+  Wrist supination 5 3-  (Blank rows = not tested)  HAND FUNCTION: Grip strength: Right: 68 lbs; Left: 34 lbs, Lateral pinch: Right: 13 lbs, Left: 7 lbs, and 3 point pinch: Right: 8 lbs, Left: 5 lbs  COORDINATION: 9 Hole Peg test: Right: 24 sec; Left: 47 sec  SENSATION: tingling/numbness in L hand digits 1-3, diminished up the L arm, no tingling in the RUE  COGNITION: Overall cognitive status: Within functional limits for tasks assessed  VISION: WNL  OBSERVATIONS:  Pt visibly in pain in upright sitting with facial grimacing, improved with transition to supine for portion of OT evaluation.  NEXT MD APPT: Return to neuro surgeon July 29.  TREATMENT DATE: 04/06/24  Evaluation completed.  Neuro re-ed: K-tape applied to L shoulder to minimize anterior sublux; initiated instruction with caregiver for Ktape technique.  Pt tolerated well and verbalized increased comfort/support of L shoulder with taping.  Further training needed.  PATIENT EDUCATION: Education details: OT role, goals, poc, Ktape technique to support L shoulder sublux, log rolling technique from mat table Person educated: Patient and Caregiver Adult nurse method: Explanation, Demonstration, and Verbal cues Education comprehension: verbalized understanding, verbal cues required, and needs further education  HOME EXERCISE PROGRAM: To be initiated in upcoming sessions  GOALS: Goals reviewed with patient? Yes  SHORT TERM GOALS: Target date: 05/18/24  Pt will be indep to perform HEP for improving BUE strength/flexibility/coordination for daily tasks. Baseline: Eval: Not yet initiated Goal status: INITIAL  LONG TERM GOALS: Target date: 06/29/24  Pt will increase L grip strength by 20 lbs or more in order to improve ability to grasp and carry heavier ADL supplies.  Baseline: Eval: L  grip 34 lbs (R 68 lbs) Goal status: INITIAL  2.  Pt will increase L lateral pinch strength by 3 or more lbs to maintain grasp of fork for independence with cutting food. Baseline: Eval: L 7 lbs (R 13 lbs) Goal status: INITIAL  3.  Pt will increase R shoulder and LUE strength by 1 MM grade or more to better engage BUEs into ADLs.  Baseline: Eval: See above MMT; pt must use R hand to lift LUE up from lap to table top; L shoulder mobility BFL, R limited below 90*.  Pt predominately performing ADLs with R dominant arm. Goal status: INITIAL  4.  Pt will increase L hand [facility] skills to manage clothing fasteners independently and manipulate small ADL supplies with reduced dropping.  Baseline: Eval: L 9 hole peg test 47 sec (R 24 sec)  5.  Pt will tolerate manual therapy, therapeutic modalities, and exercises to decrease pain in bilat shoulders/LUE to a reported 5/10 pain or less with activity.  Baseline: Eval: up to 10/10 pain with activity   Goal status: INITIAL  ASSESSMENT:  CLINICAL IMPRESSION: Patient is a 48 y.o. female who was seen today for occupational therapy evaluation for functional decline in ADLs related to cervical myelopathy.  Pt is s/p cervical spinal fusion, presenting with BUE weakness, L more severely impaired than R.  Pt's goals are to improve pain and strength throughout the BUEs, with goal to return to work towards the end of the Summer, potentially on a limited basis.  Pt currently relying on significant other and pt's son to manage BADLs/IADLs d/t pain, weakness, lack of coordination, balance, and LUE/LLE sensory deficits post sx.  Pt will benefit from skilled OT to address above noted deficits and work towards above noted goals in OT poc in order to maximize indep with daily tasks.   PERFORMANCE DEFICITS: in functional skills including ADLs, IADLs, coordination, dexterity, sensation, ROM, strength, pain, flexibility, Fine motor control, Gross motor control, mobility, balance,  endurance, decreased knowledge of precautions, decreased knowledge of use of DME, skin integrity, and UE functional use, cognitive skills including emotional, and psychosocial skills including coping strategies, environmental adaptation, habits, and routines and behaviors.   IMPAIRMENTS: are limiting patient from ADLs, IADLs, rest and sleep, work, leisure, and social participation.   CO-MORBIDITIES: has co-morbidities such as anxiety/depression, HTN, asthma, DM2, DDD (lumbar) that affects occupational performance. Patient will benefit from skilled OT to address above impairments and improve overall function.  MODIFICATION OR ASSISTANCE TO COMPLETE EVALUATION:  Min-Moderate modification of tasks or assist with assess necessary to complete an evaluation.  OT OCCUPATIONAL PROFILE AND HISTORY: Detailed assessment: Review of records and additional review of physical, cognitive, psychosocial history related to current functional performance.  CLINICAL DECISION MAKING: Moderate - several treatment options, min-mod task modification necessary  REHAB POTENTIAL: Good  EVALUATION COMPLEXITY: Moderate    PLAN:  OT FREQUENCY: 2x/week  OT DURATION: 12 weeks  PLANNED INTERVENTIONS: 97168 OT Re-evaluation, 97535 self care/ADL training, 02889 therapeutic exercise, 97530 therapeutic activity, 97112 neuromuscular re-education, 97140 manual therapy, 97116 gait training, 02989 moist heat, 97010 cryotherapy, 97034 contrast bath, 97750 Physical Performance Testing, 02239 Orthotic Initial, 97763 Orthotic/Prosthetic subsequent, passive range of motion, balance training, functional mobility training, psychosocial skills training, energy conservation, coping strategies training, patient/family education, and DME and/or AE instructions  RECOMMENDED OTHER SERVICES: None at this time  CONSULTED AND AGREED WITH PLAN OF CARE: Patient and family member/caregiver  PLAN FOR NEXT SESSION: Initiate treatment  Inocente Blazing, MS, OTR/L   Inocente MARLA Blazing, OT 04/07/2024, 7:45 PM

## 2024-04-06 NOTE — Therapy (Signed)
 OUTPATIENT PHYSICAL THERAPY NEURO/CERVICAL EVALUATION   Patient Name: Eileen Chaney MRN: [id number] DOB:1976-09-05, 48 y.o., female Today's Date: 04/06/2024   PCP: Epifanio Alm SQUIBB REFERRING PROVIDER: Benjamen Sieving   END OF SESSION:  PT End of Session - 04/06/24 1053     Visit Number 1    Number of Visits 24    Date for PT Re-Evaluation 06/29/24    PT Start Time 0848    PT Stop Time 0929    PT Time Calculation (min) 41 min    Equipment Utilized During Treatment Gait belt    Activity Tolerance Patient limited by pain    Behavior During Therapy WFL for tasks assessed/performed          Past Medical History:  Diagnosis Date   Anxiety    Arthritis    Asthma    Cervical myelopathy with cervical radiculopathy (HCC)    Complication of anesthesia    with c sections had a had time getting her sedated   DDD (degenerative disc disease), cervical    Depression    Diabetes mellitus without complication (HCC)    type 2   Family history of adverse reaction to anesthesia    mother hard to wake up   Fibromyalgia    Hyperlipidemia    Hypertension    Idiopathic urticaria    Pneumonia    PONV (postoperative nausea and vomiting)    Undifferentiated inflammatory arthritis    Past Surgical History:  Procedure Laterality Date   ABDOMINAL HYSTERECTOMY  2018   uterine fibroids   ANKLE SURGERY Right 2022   ligament repair   ANTERIOR CERVICAL DECOMP/DISCECTOMY FUSION N/A 11/12/2021   Procedure: C3-5 ANTERIOR CERVICAL DISCECTOMY AND FUSION (GLOBUS HEDRON);  Surgeon: Clois Fret, MD;  Location: [facility];  Service: Neurosurgery;  Laterality: N/A;   CERVICAL WOUND DEBRIDEMENT N/A 01/20/2022   Procedure: posterior wound debridment;  Surgeon: Clois Fret, MD;  Location: [facility];  Service: Neurosurgery;  Laterality: N/A;   CESAREAN SECTION     X3 2000, 2001, 2009   DILATION AND CURETTAGE OF UTERUS     POSTERIOR CERVICAL FUSION/FORAMINOTOMY N/A 03/15/2024    Procedure: POSTERIOR CERVICAL FUSION/FORAMINOTOMY LEVEL 4;  Surgeon: Clois Fret, MD;  Location: [facility];  Service: Neurosurgery;  Laterality: N/A;  C4-7 Posterior Spinal Decompression C4-7 Posterior Spinal Instrumentation C5-7 Fusion   POSTERIOR CERVICAL LAMINECTOMY N/A 01/07/2022   Procedure: OPEN C5-7 POSTERIOR DECOMPRESSION;  Surgeon: Clois Fret, MD;  Location: [facility];  Service: Neurosurgery;  Laterality: N/A;   TONSILLECTOMY  2000   WISDOM TOOTH EXTRACTION     Patient Active Problem List   Diagnosis Date Noted   Anxiety with depression 03/24/2024   Cervical myelopathy (HCC) 03/15/2024   Spinal stenosis in cervical region 03/15/2024   Cervical radiculopathy 03/15/2024   S/P cervical spinal fusion 03/15/2024   Wound infection after surgery 01/20/2022   Gestational diabetes 06/02/2020   Arthritis 10/11/2019   DDD (degenerative disc disease), lumbar 10/11/2019   Hyperlipidemia 10/11/2019   Idiopathic urticaria 10/11/2019   Recurrent major depressive disorder, in partial remission (HCC) 10/11/2019   Spinal stenosis of lumbar region without neurogenic claudication 10/11/2019   Type 2 diabetes mellitus without complication, with long-term current use of insulin  (HCC) 10/11/2019   Elevated lactic acid level 09/29/2017   Leukocytosis 09/29/2017   Diabetes (HCC) 09/28/2017   Hypertension 09/28/2017   Mild asthma without complication 09/28/2017   Morbid obesity (HCC) 09/28/2017   Thalassemia 09/28/2017   GAD (generalized anxiety disorder) 07/25/2014  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  Pt accompanied by: family member  PERTINENT HISTORY: Patient underwent surgery on 03/15/24 for cervical myelopathy, cervical spinal stenosis, and cervical radiculopathy with the following procedure: Posterior Segmental Instrumentation C3-7, Posterolateral arthrodesis from C5-7, Cervical Laminectomy from C4-C6 for decompression of the spinal cord with bilateral foraminotomies and facetectomies at C3/4, C4/5, C5/6, and left foraminotomy at C6/7 . PMH significant for C3-5 ACDF 2023; posterior cervical laminectomy C5-7 2024. Patient went to inpatient rehab on 03/19/24 and discharged 04/01/24.  History significant for anxiety/depression as well as component of ADHD maintained on Ritalin , asthma, class I obesity with BMI 35.28 diabetes mellitus fibromyalgia hyperlipidemia hypertension posterior cervical laminectomy 01/07/2022, anterior cervical decompressive discectomy fusion 11/12/2021 complicated by cervical wound debridement 01/20/2022.   Reports it has been hard being home, its hard to move in her house, pain management has not been controlled. Sometimes wears soft collar.  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 Patient reports she is stumbling and walks off balance.   PAIN:  Are you having pain? Yes: NPRS scale: worst pain: 10/10, least 4/10 Pain location: L neck to shoulders and down to hands ; pain and numbness is primarily L side  Pain description: nerve pain Aggravating factors: walking, being upright, move L arm Relieving factors: reclining,   PRECAUTIONS: Other: no > 10 lb   RED FLAGS: None   WEIGHT BEARING RESTRICTIONS: 10 lb   FALLS: Has patient fallen in last 6 months? No  LIVING ENVIRONMENT: Lives with: lives with their spouse Lives in: House/apartment Stairs: 2  level home, on first floor for now ; is having a ramp put in this week, has to go up stairs to bathe.  Has following equipment at home: Walker - 4 wheeled  PLOF: Independent  PATIENT GOALS: become independent and pain free.   OBJECTIVE:  Note: Objective measures were completed at Evaluation unless otherwise noted.  DIAGNOSTIC FINDINGS: IMPRESSION: 1. Stable C3-4 and C4-5 ACDF, with no change in orthopedic hardware. 2. Recent postsurgical changes from decompressive laminectomy and posterior  fusion spanning C3 through C7, with anatomic alignment. 3. Subcutaneous gas and fluid along the deep margin of the posterior midline incision, consistent with postoperative seroma or hematoma. Surgical drain is seen along the dorsal margin of the central canal. 4. No acute displaced fracture.  COGNITION: Overall cognitive status: Within functional limits for tasks assessed   SENSATION: Decreased sensation LUE and LLE   COORDINATION: Heel slide test: decreased coordination LLE  POSTURE: rounded shoulders and L shoulder elevation       Right Left  Flexion 20*  Extension 9*  Side Bending 3 8  Rotation 14 27    LOWER EXTREMITY MMT:    MMT Right Eval Left Eval  Hip flexion 5 4-  Hip extension    Hip abduction 5 4  Hip adduction 5 4  Hip internal rotation    Hip external rotation    Knee flexion 5 4  Knee extension 5 4  Ankle dorsiflexion 5 4  Ankle plantarflexion 5 4  Ankle inversion    Ankle eversion    (Blank rows = not tested)   TRANSFERS: Sit to stand: SBA  Assistive device utilized: None     Stand to sit: SBA  Assistive device utilized: None     Chair to chair: SBA  Assistive device utilized: None        STAIRS: Assess next session  GAIT: Findings: Gait Characteristics: step through pattern, decreased arm swing- Left, decreased stance time- Left, decreased stride length, shuffling, and poor foot clearance- Left, Distance walked: 30 ft, Assistive device  utilized:None, Level of assistance: SBA, and Comments: decreased coordination of LLE   FUNCTIONAL TESTS:  5 times sit to stand: perform next session.  Functional gait assessment:  Perform next session  Interpretation of scores: Non-Specific Older Adults Cutoff Score: <=22/30 = risk of falls Parkinson's Disease Cutoff score <15/30= fall risk (Hoehn & Yahr 1-4)  Minimally Clinically Important Difference (MCID)  Stroke (acute, subacute, and chronic) = MDC: 4.2 points Vestibular (acute) = MDC: 6 points Community Dwelling Older Adults =  MCID: 4 points Parkinson's Disease  =  MDC: 4.3 points  (Academy of Neu[employer]ic Physical Therapy (nd). Functional Gait Assessment. Retrieved from https://www.neuropt.org/docs/default-source/cpgs/core-outcome-measures/function-gait-assessment-pocket-guide-proof9-(2).pdf?sfvrsn=b59f35043_0.)  PATIENT SURVEYS:  ABC scale: The Activities-Specific Balance Confidence (ABC) Scale 0% 10 20 30  40 50 60 70 80 90 100% No confidence<->completely confident  "How confident are you that you will not lose your balance or become unsteady when you . . .   Date tested 04/06/24  Walk around the house 0%  2. Walk up or down stairs 10%  3. Bend over and pick up a slipper from in front of a closet floor 0%  4. Reach for a small can off a shelf at eye level 10%  5. Stand on tip toes and reach for something above your head 0%  6. Stand on a chair and reach for something 0%  7. Sweep the floor 0%  8. Walk outside the house to a car parked in the driveway 10%  9. Get into or out of a car 10%  10. Walk across a parking lot to the mall 0%  11. Walk up or down a ramp 0%  12. Walk in a crowded mall where people rapidly walk past you 0%  13. Are bumped into by people as you walk through the mall 0%  14. Step onto or off of an escalator while you are holding onto the railing 0%  15. Step onto or off an escalator  while holding onto parcels such that you cannot hold onto the railing 0%   16. Walk outside on icy sidewalks 0%  Total: #/16 2.5    NDI:  NECK DISABILITY INDEX  Date: 04/06/24  Score  Pain intensity 2 = The pain is moderate at the moment  2. Personal care (washing, dressing, etc.) 4 =  I need help every day in most aspects of self care  3. Lifting 5 = I cannot lift or carry anything   4. Reading 4 =  I can hardly read at all because of severe pain in my neck  5. Headaches 0 = I have no headaches at all  6. Concentration 2 = I have a fair degree of difficulty in concentrating when I want to  7. Work 0 =  I can do as much work as I want to  8. Driving 5 = I can't drive my car at all  9. Sleeping 4 = My sleep is greatly disturbed (3-5 hrs sleepless)   10. Recreation 5 = I can't do any recreation activities at all  Total 31/50   Minimum Detectable Change (90% confidence): 5 points or 10% points                                                                                                                              TREATMENT DATE: 04/06/24     PATIENT EDUCATION: Education details: goals, POC, HEP  Person educated: Patient Education method: Explanation, Demonstration, Tactile cues, and Verbal cues Education comprehension: verbalized understanding, returned demonstration, verbal cues required, tactile cues required, and needs further education  HOME EXERCISE PROGRAM: Give next session   GOALS: Goals reviewed with patient? Yes  SHORT TERM GOALS: Target date: 05/04/2024    Patient will be independent in home exercise program to improve strength/mobility for better functional independence with ADLs. Baseline: give next session  Goal status: INITIAL    LONG TERM GOALS: Target date: 06/29/2024   Patient will report a worst pain of 3/10 on VAS in cervical spine to improve tolerance with ADLs and reduced symptoms with activities.  Baseline: 7/8: 10/10 Goal status: INITIAL  2.  Patient will increase Functional Gait Assessment score to >20/30 as to  reduce fall risk and improve dynamic gait safety with community ambulation Baseline: perform next session  Goal status: INITIAL  3.  Patient will reduce Neck Disability Index score to <10% to demonstrate minimal disability with ADL's including improved sleeping tolerance, sitting tolerance, etc for better mobility at home and work. Baseline: 31/50 Goal status: INITIAL  4.  Patient will increase ABC scale score >80% to demonstrate better functional mobility and better confidence with ADLs.  Baseline: 2.5% Goal status: INITIAL   ASSESSMENT:  CLINICAL IMPRESSION: Patient is a 48 y.o. female who was seen today for physical therapy evaluation and treatment s/p cervical surgery. Patient has radiating pain from her cervical spine to her finger tips. Her coordination of her LUE  and LLE are impacted in addition to her strength. She has some instability which will be further tested next session with an FGA. She does have some dizziness with transitioning from supine to seated but it resolves with prolonged seated position. Cervical ROM is limited by pain and post surgical muscle tension. Patient will benefit from skilled physical therapy to improve cervical mobility, quality of life and stability.   OBJECTIVE IMPAIRMENTS: Abnormal gait, decreased activity tolerance, decreased balance, decreased coordination, decreased mobility, difficulty walking, decreased ROM, decreased strength, hypomobility, impaired perceived functional ability, impaired flexibility, impaired sensation, impaired UE functional use, improper body mechanics, postural dysfunction, and pain.   ACTIVITY LIMITATIONS: carrying, lifting, bending, sitting, standing, squatting, sleeping, stairs, transfers, bed mobility, bathing, toileting, dressing, reach over head, hygiene/grooming, locomotion level, and caring for others  PARTICIPATION LIMITATIONS: meal prep, cleaning, laundry, personal finances, interpersonal relationship, driving,  shopping, community activity, occupation, and school  PERSONAL FACTORS: Age, Past/current experiences, Time since onset of injury/illness/exacerbation, and 3+ comorbidities: HTN, diabetes, cervical myelopathy and radiculopathy, DDD, GAD, HLD, depression, anxiety are also affecting patient's functional outcome.   REHAB POTENTIAL: Good  CLINICAL DECISION MAKING: Evolving/moderate complexity  EVALUATION COMPLEXITY: Moderate  PLAN:  PT FREQUENCY: 2x/week  PT DURATION: 12 weeks  PLANNED INTERVENTIONS: 97164- PT Re-evaluation, 97750- Physical Performance Testing, 97110-Therapeutic exercises, 97530- Therapeutic activity, [id number]- Neuromuscular re-education, 97535- Self Care, [id number]- Manual therapy, [id number]- Gait training, [phone]- Canalith repositioning, [id number]- Electrical stimulation (unattended), [phone]- Electrical stimulation (manual), [id number]- Ultrasound, [id number] (1-2 muscles), [id number] (3+ muscles)- Dry Needling, Patient/Family education, Balance training, Stair training, Taping, Joint mobilization, Spinal mobilization, Scar mobilization, Vestibular training, Visual/preceptual remediation/compensation, DME instructions, Cryotherapy, Moist heat, and Biofeedback  PLAN FOR NEXT SESSION: FGA, 5x STS , HEP for cervical ROM strength and balance   Ansh Fauble, PT 04/06/2024, 2:27 PM

## 2024-04-08 ENCOUNTER — Ambulatory Visit

## 2024-04-08 ENCOUNTER — Encounter: Payer: Self-pay | Admitting: Physical Therapy

## 2024-04-08 ENCOUNTER — Ambulatory Visit: Admitting: Physical Therapy

## 2024-04-08 DIAGNOSIS — R262 Difficulty in walking, not elsewhere classified: Secondary | ICD-10-CM

## 2024-04-08 DIAGNOSIS — M542 Cervicalgia: Secondary | ICD-10-CM | POA: Diagnosis not present

## 2024-04-08 DIAGNOSIS — G959 Disease of spinal cord, unspecified: Secondary | ICD-10-CM

## 2024-04-08 DIAGNOSIS — R278 Other lack of coordination: Secondary | ICD-10-CM

## 2024-04-08 DIAGNOSIS — M6281 Muscle weakness (generalized): Secondary | ICD-10-CM

## 2024-04-08 DIAGNOSIS — R2681 Unsteadiness on feet: Secondary | ICD-10-CM

## 2024-04-08 NOTE — Therapy (Signed)
 OUTPATIENT PHYSICAL THERAPY NEURO TREATMENT   Patient Name: Eileen Chaney MRN: [id number] DOB:Nov 06, 1975, 48 y.o., female Today's Date: 04/08/2024   PCP: Epifanio Alm SQUIBB REFERRING PROVIDER: Benjamen Sieving   END OF SESSION:  PT End of Session - 04/08/24 1122     Visit Number 2    Number of Visits 24    Date for PT Re-Evaluation 06/29/24    Progress Note Due on Visit 10    PT Start Time 1030    PT Stop Time 1100    PT Time Calculation (min) 30 min    Equipment Utilized During Treatment Gait belt    Activity Tolerance Patient tolerated treatment well    Behavior During Therapy WFL for tasks assessed/performed           Past Medical History:  Diagnosis Date   Anxiety    Arthritis    Asthma    Cervical myelopathy with cervical radiculopathy (HCC)    Complication of anesthesia    with c sections had a had time getting her sedated   DDD (degenerative disc disease), cervical    Depression    Diabetes mellitus without complication (HCC)    type 2   Family history of adverse reaction to anesthesia    mother hard to wake up   Fibromyalgia    Hyperlipidemia    Hypertension    Idiopathic urticaria    Pneumonia    PONV (postoperative nausea and vomiting)    Undifferentiated inflammatory arthritis    Past Surgical History:  Procedure Laterality Date   ABDOMINAL HYSTERECTOMY  2018   uterine fibroids   ANKLE SURGERY Right 2022   ligament repair   ANTERIOR CERVICAL DECOMP/DISCECTOMY FUSION N/A 11/12/2021   Procedure: C3-5 ANTERIOR CERVICAL DISCECTOMY AND FUSION (GLOBUS HEDRON);  Surgeon: Clois Fret, MD;  Location: [facility];  Service: Neurosurgery;  Laterality: N/A;   CERVICAL WOUND DEBRIDEMENT N/A 01/20/2022   Procedure: posterior wound debridment;  Surgeon: Clois Fret, MD;  Location: [facility];  Service: Neurosurgery;  Laterality: N/A;   CESAREAN SECTION     X3 2000, 2001, 2009   DILATION AND CURETTAGE OF UTERUS     POSTERIOR CERVICAL  FUSION/FORAMINOTOMY N/A 03/15/2024   Procedure: POSTERIOR CERVICAL FUSION/FORAMINOTOMY LEVEL 4;  Surgeon: Clois Fret, MD;  Location: [facility];  Service: Neurosurgery;  Laterality: N/A;  C4-7 Posterior Spinal Decompression C4-7 Posterior Spinal Instrumentation C5-7 Fusion   POSTERIOR CERVICAL LAMINECTOMY N/A 01/07/2022   Procedure: OPEN C5-7 POSTERIOR DECOMPRESSION;  Surgeon: Clois Fret, MD;  Location: [facility];  Service: Neurosurgery;  Laterality: N/A;   TONSILLECTOMY  2000   WISDOM TOOTH EXTRACTION     Patient Active Problem List   Diagnosis Date Noted   Anxiety with depression 03/24/2024   Cervical myelopathy (HCC) 03/15/2024   Spinal stenosis in cervical region 03/15/2024   Cervical radiculopathy 03/15/2024   S/P cervical spinal fusion 03/15/2024   Wound infection after surgery 01/20/2022   Gestational diabetes 06/02/2020   Arthritis 10/11/2019   DDD (degenerative disc disease), lumbar 10/11/2019   Hyperlipidemia 10/11/2019   Idiopathic urticaria 10/11/2019   Recurrent major depressive disorder, in partial remission (HCC) 10/11/2019   Spinal stenosis of lumbar region without neurogenic claudication 10/11/2019   Type 2 diabetes mellitus without complication, with long-term current use of insulin  (HCC) 10/11/2019   Elevated lactic acid level 09/29/2017   Leukocytosis 09/29/2017   Diabetes (HCC) 09/28/2017   Hypertension 09/28/2017   Mild asthma without complication 09/28/2017   Morbid obesity (HCC) 09/28/2017  Thalassemia 09/28/2017   GAD (generalized anxiety disorder) 07/25/2014   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, unspecified 03/14/2014    ONSET DATE: 03/15/24  REFERRING DIAG: cervical myelopathy, s/p cervical spinal fusion   THERAPY DIAG:  Muscle weakness (generalized)  Cervical myelopathy (HCC)  Unsteadiness on feet  Difficulty in walking, not elsewhere classified  Rationale for Evaluation and Treatment:  Rehabilitation  SUBJECTIVE:                                                                                                                                                                                             SUBJECTIVE STATEMENT: Pt presents to surgery with significant other. Cites neck pain and rates it 3/10, but otherwise is doing great.   Patient presents to PT s/p cervical surgery.  Pt accompanied by: family member  PERTINENT HISTORY: Patient underwent surgery on 03/15/24 for cervical myelopathy, cervical spinal stenosis, and cervical radiculopathy with the following procedure: Posterior Segmental Instrumentation C3-7, Posterolateral arthrodesis from C5-7, Cervical Laminectomy from C4-C6 for decompression of the spinal cord with bilateral foraminotomies and facetectomies at C3/4, C4/5, C5/6, and left foraminotomy at C6/7 . PMH significant for C3-5 ACDF 2023; posterior cervical laminectomy C5-7 2024. Patient went to inpatient rehab on 03/19/24 and discharged 04/01/24.  History significant for anxiety/depression as well as component of ADHD maintained on Ritalin , asthma, class I obesity with BMI 35.28 diabetes mellitus fibromyalgia hyperlipidemia hypertension posterior cervical laminectomy 01/07/2022, anterior cervical decompressive discectomy fusion 11/12/2021 complicated by cervical wound debridement 01/20/2022.   Reports it has been hard being home, its hard to move in her house, pain management has not been controlled. Sometimes wears soft collar.  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 Patient reports she is stumbling and walks off balance.   PAIN:  Are you having pain? Yes: NPRS scale: worst pain: 10/10, least 4/10 Pain location: L neck to shoulders and down to hands ; pain and numbness is primarily L side  Pain description: nerve pain Aggravating factors: walking, being upright, move L arm Relieving factors: reclining,   PRECAUTIONS: Other: no > 10 lb   RED  FLAGS: None   WEIGHT BEARING RESTRICTIONS: 10 lb   FALLS: Has patient fallen in last 6 months? No  LIVING ENVIRONMENT: Lives with: lives with their spouse Lives in: House/apartment Stairs: 2 level home, on first floor for now ; is having a ramp put in this week, has to go up stairs to bathe.  Has following equipment at home: Walker - 4 wheeled  PLOF: Independent  PATIENT GOALS: become independent and pain free.   OBJECTIVE:  Note:  Objective measures were completed at Evaluation unless otherwise noted.  DIAGNOSTIC FINDINGS: IMPRESSION: 1. Stable C3-4 and C4-5 ACDF, with no change in orthopedic hardware. 2. Recent postsurgical changes from decompressive laminectomy and posterior fusion spanning C3 through C7, with anatomic alignment. 3. Subcutaneous gas and fluid along the deep margin of the posterior midline incision, consistent with postoperative seroma or hematoma. Surgical drain is seen along the dorsal margin of the central canal. 4. No acute displaced fracture.  COGNITION: Overall cognitive status: Within functional limits for tasks assessed   SENSATION: Decreased sensation LUE and LLE   COORDINATION: Heel slide test: decreased coordination LLE  POSTURE: rounded shoulders and L shoulder elevation       Right Left  Flexion 20*  Extension 9*  Side Bending 3 8  Rotation 14 27    LOWER EXTREMITY MMT:    MMT Right Eval Left Eval  Hip flexion 5 4-  Hip extension    Hip abduction 5 4  Hip adduction 5 4  Hip internal rotation    Hip external rotation    Knee flexion 5 4  Knee extension 5 4  Ankle dorsiflexion 5 4  Ankle plantarflexion 5 4  Ankle inversion    Ankle eversion    (Blank rows = not tested)   TRANSFERS: Sit to stand: SBA  Assistive device utilized: None     Stand to sit: SBA  Assistive device utilized: None     Chair to chair: SBA  Assistive device utilized: None        STAIRS: Assess next session  GAIT: Findings: Gait  Characteristics: step through pattern, decreased arm swing- Left, decreased stance time- Left, decreased stride length, shuffling, and poor foot clearance- Left, Distance walked: 30 ft, Assistive device utilized:None, Level of assistance: SBA, and Comments: decreased coordination of LLE   FUNCTIONAL TESTS:  5 times sit to stand: perform next session.  Functional gait assessment:  Perform next session  Interpretation of scores: Non-Specific Older Adults Cutoff Score: <=22/30 = risk of falls Parkinson's Disease Cutoff score <15/30= fall risk (Hoehn & Yahr 1-4)  Minimally Clinically Important Difference (MCID)  Stroke (acute, subacute, and chronic) = MDC: 4.2 points Vestibular (acute) = MDC: 6 points Community Dwelling Older Adults =  MCID: 4 points Parkinson's Disease  =  MDC: 4.3 points  (Academy of Neurologic Physical Therapy (nd). Functional Gait Assessment. Retrieved from https://www.neuropt.org/docs/default-source/cpgs/core-outcome-measures/function-gait-assessment-pocket-guide-proof9-(2).pdf?sfvrsn=b51f35043_0.)  PATIENT SURVEYS:  ABC scale: The Activities-Specific Balance Confidence (ABC) Scale 0% 10 20 30  40 50 60 70 80 90 100% No confidence<->completely confident  "How confident are you that you will not lose your balance or become unsteady when you . . .   Date tested 04/06/24  Walk around the house 0%  2. Walk up or down stairs 10%  3. Bend over and pick up a slipper from in front of a closet floor 0%  4. Reach for a small can off a shelf at eye level 10%  5. Stand on tip toes and reach for something above your head 0%  6. Stand on a chair and reach for something 0%  7. Sweep the floor 0%  8. Walk outside the house to a car parked in the driveway 10%  9. Get into or out of a car 10%  10. Walk across a parking lot to the mall 0%  11. Walk up or down a ramp 0%  12. Walk in a crowded mall where people rapidly walk past you 0%  13. Are bumped into  by people as you walk through  the mall 0%  14. Step onto or off of an escalator while you are holding onto the railing 0%  15. Step onto or off an escalator while holding onto parcels such that you cannot hold onto the railing 0%  16. Walk outside on icy sidewalks 0%  Total: #/16 2.5    NDI:  NECK DISABILITY INDEX  Date: Eval  Score  Pain intensity 2 = The pain is moderate at the moment  2. Personal care (washing, dressing, etc.) 4 =  I need help every day in most aspects of self care  3. Lifting 5 = I cannot lift or carry anything   4. Reading 4 =  I can hardly read at all because of severe pain in my neck  5. Headaches 0 = I have no headaches at all  6. Concentration 2 = I have a fair degree of difficulty in concentrating when I want to  7. Work 0 =  I can do as much work as I want to  8. Driving 5 = I can't drive my car at all  9. Sleeping 4 = My sleep is greatly disturbed (3-5 hrs sleepless)   10. Recreation 5 = I can't do any recreation activities at all  Total 31/50   Minimum Detectable Change (90% confidence): 5 points or 10% points                                                                                                                              TREATMENT DATE: 04/08/24    Physical Performance   Five times Sit to Stand Test (FTSS)  TIME: 11.36 sec  Cut off scores indicative of increased fall risk: >12 sec CVA, >16 sec PD, >13 sec vestibular (ANPTA Core Set of Outcome Measures for Adults with Neurologic Conditions, 2018)    Pt participated in Functional Gait Assessment (FGA) with score of 25/30 demonstrating low fall risk (low fall risk 25-28, medium fall risk 19-24, and high fall risk <19).     [facility] PT Assessment - 04/08/24 0001       Functional Gait  Assessment   Gait assessed  Yes    Gait Level Surface Walks 20 ft in less than 5.5 sec, no assistive devices, good speed, no evidence for imbalance, normal gait pattern, deviates no more than 6 in outside of the 12 in walkway width.     Change in Gait Speed Able to smoothly change walking speed without loss of balance or gait deviation. Deviate no more than 6 in outside of the 12 in walkway width.    Gait with Horizontal Head Turns Performs head turns smoothly with slight change in gait velocity (eg, minor disruption to smooth gait path), deviates 6-10 in outside 12 in walkway width, or uses an assistive device.    Gait with Vertical Head Turns Performs task with slight change in gait velocity (eg, minor disruption to  smooth gait path), deviates 6 - 10 in outside 12 in walkway width or uses assistive device    Gait and Pivot Turn Pivot turns safely in greater than 3 sec and stops with no loss of balance, or pivot turns safely within 3 sec and stops with mild imbalance, requires small steps to catch balance.    Step Over Obstacle Is able to step over 2 stacked shoe boxes taped together (9 in total height) without changing gait speed. No evidence of imbalance.    Gait with Narrow Base of Support Ambulates 7-9 steps.    Gait with Eyes Closed Walks 20 ft, uses assistive device, slower speed, mild gait deviations, deviates 6-10 in outside 12 in walkway width. Ambulates 20 ft in less than 9 sec but greater than 7 sec.    Ambulating Backwards Walks 20 ft, no assistive devices, good speed, no evidence for imbalance, normal gait    Steps Alternating feet, no rail.    Total Score 25           Self-Care  Addressed questions regarding HEP and at what pain level to stop intervention. Addressed questions regarding use of cervical collar including duration and frequency. Addressed questions about fall risks and how cervical myelopathy can relate to balance and risk of falls.    Treatment times today are shorter d/t late arrival of pt.   PATIENT EDUCATION: Education details: goals, POC, HEP  Person educated: Patient Education method: Explanation, Demonstration, Tactile cues, and Verbal cues Education comprehension: verbalized  understanding, returned demonstration, verbal cues required, tactile cues required, and needs further education  HOME EXERCISE PROGRAM: Access Code: [email or web address] URL: [email or web address] Date: 04/08/2024 Prepared by: Lonni Gainer Exercises - Seated Scapular Retraction  - 1-2 x daily - 7 x weekly - 3 sets - 30 reps - Supine Cervical Rotation AROM on Pillow  - 1 x daily - 7 x weekly - 3 sets - 20 reps - Supine Cervical Sidebending  - 1 x daily - 7 x weekly - 3 sets - 20 reps   GOALS: Goals reviewed with patient? Yes  SHORT TERM GOALS: Target date: 05/04/2024    Patient will be independent in home exercise program to improve strength/mobility for better functional independence with ADLs. Baseline: give next session  Goal status: INITIAL    LONG TERM GOALS: Target date: 06/29/2024   Patient will report a worst pain of 3/10 on VAS in cervical spine to improve tolerance with ADLs and reduced symptoms with activities.  Baseline: 7/8: 10/10 Goal status: INITIAL  2.  Patient will increase Functional Gait Assessment score to >20/30 as to reduce fall risk and improve dynamic gait safety with community ambulation Baseline: perform next session  Goal status: INITIAL  3.  Patient will reduce Neck Disability Index score to <10% to demonstrate minimal disability with ADL's including improved sleeping tolerance, sitting tolerance, etc for better mobility at home and work. Baseline: 31/50 Goal status: INITIAL  4.  Patient will increase ABC scale score >80% to demonstrate better functional mobility and better confidence with ADLs.  Baseline: 2.5% Goal status: INITIAL   ASSESSMENT:  CLINICAL IMPRESSION: Pt session was cut a little short today due to pt arriving late to scheduled appointment time. Patient is a 48 y.o. female who was seen today for physical therapy treatment for cervical myelopathy post surgery. Pt completed FGA and 5xSTS today. Her 5xSTS time is below norms for her  age group (12.6 +/- 1.8 sec) and indicating she has some power deficits  that may increase her risk of falls during functional activities. Pt scored well on FGA, but did display unsteadiness and lateral sway with head turns, walking with her eyes closed and NBOS. Head turns are likely a 2/3 d/t recent neck surgery and apprehension regarding turns. Pt did state she had neck pain during vertical head turns. This should resolve as her surgery heals and treatment continues. Pt will continue to benefit from skilled physical therapy intervention to address impairments, improve QOL, and attain therapy goals.    OBJECTIVE IMPAIRMENTS: Abnormal gait, decreased activity tolerance, decreased balance, decreased coordination, decreased mobility, difficulty walking, decreased ROM, decreased strength, hypomobility, impaired perceived functional ability, impaired flexibility, impaired sensation, impaired UE functional use, improper body mechanics, postural dysfunction, and pain.   ACTIVITY LIMITATIONS: carrying, lifting, bending, sitting, standing, squatting, sleeping, stairs, transfers, bed mobility, bathing, toileting, dressing, reach over head, hygiene/grooming, locomotion level, and caring for others  PARTICIPATION LIMITATIONS: meal prep, cleaning, laundry, personal finances, interpersonal relationship, driving, shopping, community activity, occupation, and school  PERSONAL FACTORS: Age, Past/current experiences, Time since onset of injury/illness/exacerbation, and 3+ comorbidities: HTN, diabetes, cervical myelopathy and radiculopathy, DDD, GAD, HLD, depression, anxiety are also affecting patient's functional outcome.   REHAB POTENTIAL: Good  CLINICAL DECISION MAKING: Evolving/moderate complexity  EVALUATION COMPLEXITY: Moderate  PLAN:  PT FREQUENCY: 2x/week  PT DURATION: 12 weeks  PLANNED INTERVENTIONS: 97164- PT Re-evaluation, 97750- Physical Performance Testing, 97110-Therapeutic exercises, 97530-  Therapeutic activity, 97112- Neuromuscular re-education, 97535- Self Care, [id number]- Manual therapy, [id number]- Gait training, [phone]- Canalith repositioning, [id number]- Electrical stimulation (unattended), [phone]- Electrical stimulation (manual), [id number]- Ultrasound, [id number] (1-2 muscles), [id number] (3+ muscles)- Dry Needling, Patient/Family education, Balance training, Stair training, Taping, Joint mobilization, Spinal mobilization, Scar mobilization, Vestibular training, Visual/preceptual remediation/compensation, DME instructions, Cryotherapy, Moist heat, and Biofeedback  PLAN FOR NEXT SESSION: L LE strength and balance training, Progress HEP   Palmyra Rogacki, Student-PT 04/08/2024, 11:39 AM

## 2024-04-11 NOTE — Therapy (Signed)
 OUTPATIENT OCCUPATIONAL THERAPY NEURO TREATMENT NOTE  Patient Name: Eileen Chaney MRN: [id number] DOB:1976-01-03, 48 y.o., female Today's Date: 04/11/2024  PCP: Dr. Alm Needle REFERRING PROVIDER: Toribio Pitch, PA-C  END OF SESSION:  OT End of Session - 04/11/24 1249     Visit Number 2    Number of Visits 24    Date for OT Re-Evaluation 06/29/24    OT Start Time 1100    OT Stop Time 1145    OT Time Calculation (min) 45 min    Activity Tolerance Patient tolerated treatment well    Behavior During Therapy WFL for tasks assessed/performed         Past Medical History:  Diagnosis Date   Anxiety    Arthritis    Asthma    Cervical myelopathy with cervical radiculopathy (HCC)    Complication of anesthesia    with c sections had a had time getting her sedated   DDD (degenerative disc disease), cervical    Depression    Diabetes mellitus without complication (HCC)    type 2   Family history of adverse reaction to anesthesia    mother hard to wake up   Fibromyalgia    Hyperlipidemia    Hypertension    Idiopathic urticaria    Pneumonia    PONV (postoperative nausea and vomiting)    Undifferentiated inflammatory arthritis    Past Surgical History:  Procedure Laterality Date   ABDOMINAL HYSTERECTOMY  2018   uterine fibroids   ANKLE SURGERY Right 2022   ligament repair   ANTERIOR CERVICAL DECOMP/DISCECTOMY FUSION N/A 11/12/2021   Procedure: C3-5 ANTERIOR CERVICAL DISCECTOMY AND FUSION (GLOBUS HEDRON);  Surgeon: Clois Fret, MD;  Location: [facility];  Service: Neurosurgery;  Laterality: N/A;   CERVICAL WOUND DEBRIDEMENT N/A 01/20/2022   Procedure: posterior wound debridment;  Surgeon: Clois Fret, MD;  Location: [facility];  Service: Neurosurgery;  Laterality: N/A;   CESAREAN SECTION     X3 2000, 2001, 2009   DILATION AND CURETTAGE OF UTERUS     POSTERIOR CERVICAL FUSION/FORAMINOTOMY N/A 03/15/2024   Procedure: POSTERIOR CERVICAL FUSION/FORAMINOTOMY  LEVEL 4;  Surgeon: Clois Fret, MD;  Location: [facility];  Service: Neurosurgery;  Laterality: N/A;  C4-7 Posterior Spinal Decompression C4-7 Posterior Spinal Instrumentation C5-7 Fusion   POSTERIOR CERVICAL LAMINECTOMY N/A 01/07/2022   Procedure: OPEN C5-7 POSTERIOR DECOMPRESSION;  Surgeon: Clois Fret, MD;  Location: [facility];  Service: Neurosurgery;  Laterality: N/A;   TONSILLECTOMY  2000   WISDOM TOOTH EXTRACTION     Patient Active Problem List   Diagnosis Date Noted   Anxiety with depression 03/24/2024   Cervical myelopathy (HCC) 03/15/2024   Spinal stenosis in cervical region 03/15/2024   Cervical radiculopathy 03/15/2024   S/P cervical spinal fusion 03/15/2024   Wound infection after surgery 01/20/2022   Gestational diabetes 06/02/2020   Arthritis 10/11/2019   DDD (degenerative disc disease), lumbar 10/11/2019   Hyperlipidemia 10/11/2019   Idiopathic urticaria 10/11/2019   Recurrent major depressive disorder, in partial remission (HCC) 10/11/2019   Spinal stenosis of lumbar region without neurogenic claudication 10/11/2019   Type 2 diabetes mellitus without complication, with long-term current use of insulin  (HCC) 10/11/2019   Elevated lactic acid level 09/29/2017   Leukocytosis 09/29/2017   Diabetes (HCC) 09/28/2017   Hypertension 09/28/2017   Mild asthma without complication 09/28/2017   Morbid obesity (HCC) 09/28/2017   Thalassemia 09/28/2017   GAD (generalized anxiety disorder) 07/25/2014   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, unspecified  03/14/2014   ONSET DATE: 03/15/24  REFERRING DIAG:  G95.9 (ICD-10-CM) - Cervical myelopathy (HCC)  Z98.1 (ICD-10-CM) - S/P cervical spinal fusion   THERAPY DIAG:  Muscle weakness (generalized)  Other lack of coordination  Cervical myelopathy (HCC)  Rationale for Evaluation and Treatment: Rehabilitation  SUBJECTIVE:  SUBJECTIVE STATEMENT: Pt reports the Ktape felt good last session and  requested to be taped again. Pt accompanied by: [name]  PERTINENT HISTORY: Per chart: CIR d/c summary: Eileen Chaney is a 48 y.o. right-handed female with history significant for anxiety/depression as well as component of ADHD maintained on Ritalin , asthma, class I obesity with BMI 35.28 diabetes mellitus fibromyalgia hyperlipidemia hypertension posterior cervical laminectomy 01/07/2022, anterior cervical decompressive discectomy fusion 11/12/2021 complicated by cervical wound debridement 01/20/2022.  Per chart review patient lives with daughter.  She has good support from her family.  Two-level home bed and bath on main level independent community ambulator.  Presented to [facility]/15/2024 with increasing difficulty with dexterity in her hands as well as poor balance.  She was having trouble using her phone as well as difficulty with buttons and clasping her necklaces.  She continued to have ongoing neck and left arm and shoulder pain with numbness as well as low back pain that extends into her buttocks.  She had denied any recent falls.  Recent imaging showed severe central canal stenosis at C4-5 C5-6 with foraminal stenosis from C3-7.  Most recent preoperative labs showed a hemoglobin of 12.8 hematocrit 40.3 on 03/03/2024 and unremarkable chemistries.  Underwent posterior segmental instrumentation of C3-7 as well as posterior lateral arthrodesis from C5-7 with cervical laminectomy from C4-C6 for decompression of the spinal cord with bilateral foraminotomies and facetectomies at C3/4 C4-5 C5-6 and left foraminotomies C6-7 03/15/2024 per Dr. Reeves Nine.  Laced in a cervical brace for comfort.  She did complete a course of Decadron  taper.  Patient control with pain with the use of Dilaudid  as well as Valium .  Lovenox  added for DVT prophylaxis 03/16/2024.  Follow-up imaging CT/MRI cervical spine 03/16/2024 due to complaints of increasing neck pain and acute left arm weakness showed noted degenerative  changes moderate to severe spinal canal stenosis C3-4 with mild cord compression without definitive cord signal abnormality.  Multilevel foraminal stenosis greatest and severe bilaterally at C3-4 C5-6 on the left at C6-7.  Moderate to severe spinal canal stenosis on the right at C6-7.  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  Therapy evaluations completed due to patient decreased functional mobility was admitted for a comprehensive rehab program.   PRECAUTIONS: Cervical, wears soft cervical collar prn   WEIGHT BEARING RESTRICTIONS: Yes ; no lifting >10 lbs  PAIN: 04/08/24: 3/10 at rest, 8-10 with activity Are you having pain? Yes: NPRS scale: 4-5/10 pain at rest, 10/10 with activity Pain location: neck, shoulders down to hands Pain description: nerve pain Aggravating factors: walking, sitting, standing, moving L arm  Relieving factors: reclining  FALLS: Has patient fallen in last 6 months? 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   OBJECTIVE:  Note: Objective measures were completed at Evaluation unless otherwise noted.  HAND DOMINANCE: Right  ADLs: Overall ADLs: Caregiver assist for ADLs Transfers/ambulation related to ADLs: using rollator at home for balance and to take pressure off shoulders and back Eating:  difficulty cutting food Grooming: Max A with hair care d/t BUE weakness UB Dressing: Limited engagement of LUE LB Dressing: Difficulty hiking pants d/t limited engagement of the LUE Toileting: modified indep using R dominant/less affected arm Bathing: supv for tub/shower transfers; assist to wash upper R side of body; reports her long handled sponge is ineffective Tub Shower transfers: transfer tub bench in place but able to step over shower with supv and sits on bench prn throughout shower Equipment: Transfer tub bench  IADLs: Shopping: dep Light housekeeping: Max A; pt reports she can load washing machine but is unable to load/unload dishwasher d/t the bending required Meal Prep: Partner managing meals Community mobility: increased pain/limited Medication management: indep Financial management: indep  POSTURE COMMENTS:  rounded shoulders and forward head  ACTIVITY TOLERANCE: Activity tolerance: Limited tolerance for sitting and standing; more comfortable in supine (performed eval partially supine on mat to manage pain)  UPPER EXTREMITY ROM:    Active ROM Right eval Left eval  Shoulder flexion 55 30  Shoulder abduction 55 20  Shoulder adduction    Shoulder extension    Shoulder internal rotation WNL Unable to attempt behind back  Shoulder external rotation -20 Unable to attempt behind head  Elbow flexion    Elbow extension    Wrist flexion    Wrist extension    Wrist ulnar deviation    Wrist radial deviation    Wrist pronation    Wrist supination    (Blank rows = not tested)  UPPER EXTREMITY MMT:     MMT Right eval Left eval  Shoulder flexion 2+ 2-  Shoulder abduction 2+ 2-  Shoulder adduction    Shoulder extension    Shoulder internal rotation 4 2+  Shoulder external rotation 3- 2  Middle trapezius    Lower trapezius    Elbow flexion 4+ 1 to 2- (will assess more thoroughly next session)  Elbow extension 5 4  Wrist flexion 5 4  Wrist extension 5 4  Wrist  ulnar deviation    Wrist radial deviation    Wrist pronation 5 3+  Wrist supination 5 3-  (Blank rows = not tested)  HAND FUNCTION: Grip strength: Right: 68 lbs; Left: 34 lbs, Lateral pinch: Right: 13 lbs, Left: 7 lbs, and 3 point pinch: Right: 8 lbs, Left: 5 lbs  COORDINATION: 9 Hole Peg test: Right: 24 sec; Left: 47 sec  SENSATION: tingling/numbness in L hand digits 1-3, diminished up the L arm, no tingling in the RUE  COGNITION: Overall cognitive status: Within functional limits for tasks assessed  VISION: WNL  OBSERVATIONS:  Pt visibly in pain in upright sitting with facial grimacing, improved with transition to supine for portion of OT evaluation.  NEXT MD APPT: Return to neuro surgeon July 29.  TREATMENT DATE: 04/08/24  Neuro re-ed: K-tape applied to L shoulder to minimize anterior sublux; application technique reviewed.  Pt tolerated well and verbalized increased comfort/support of L shoulder with taping.    Therapeutic Exercise: Supine: -L shoulder PROM all planes (flex/abd 0-90*) in prep for AAROM/AROM  -Performed eccentric bicep strengthening, active assisted into extension x3 sets 10 reps each -Active L shoulder ER/IR with elbow slightly abducted at side; mild resistance from OT for 2 sets 10 reps each -AAROM with cane for chest press, shoulder flex, horiz abd/add x10 reps each; min guard-min A to support LUE; visual handout issued for HEP Sitting: -Instructed in self eccentric biceps strengthening using R hand to support the L x10 reps -Resisted pron/sup with elbow at 90* to side for 2 sets 10 reps each using 3 lb weight. -Issued visual handout and reviewed putty exercises for pt to complete at home  PATIENT EDUCATION: Education details: HEP progression Person educated: Patient and Medical laboratory scientific officer Education method: Explanation, Demonstration,  and Verbal cues Education comprehension: verbalized understanding, verbal cues required, and needs further education  HOME EXERCISE PROGRAM: To be initiated in upcoming sessions  GOALS: Goals reviewed with patient? Yes  SHORT TERM GOALS: Target date: 05/18/24  Pt will be indep to perform HEP for improving BUE strength/flexibility/coordination for daily tasks. Baseline: Eval: Not yet initiated Goal status: INITIAL  LONG TERM GOALS: Target date: 06/29/24  Pt will increase L grip strength by 20 lbs or more in order to improve ability to grasp and carry heavier ADL supplies.  Baseline: Eval: L grip 34 lbs (R 68 lbs) Goal status: INITIAL  2.  Pt will increase L lateral pinch strength by 3 or more lbs to maintain grasp of fork for independence with cutting food. Baseline: Eval: L 7 lbs (R 13 lbs) Goal status: INITIAL  3.  Pt will increase R shoulder and LUE strength by 1 MM grade or more to better engage BUEs into ADLs.  Baseline: Eval: See above MMT; pt must use R hand to lift LUE up from lap to table top; L shoulder mobility BFL, R limited below 90*.  Pt predominately performing ADLs with R dominant arm. Goal status: INITIAL  4.  Pt will increase L hand [facility] skills to manage clothing fasteners independently and manipulate small ADL supplies with reduced dropping.  Baseline: Eval: L 9 hole peg test 47 sec (R 24 sec)  5.  Pt will tolerate manual therapy, therapeutic modalities, and exercises to decrease pain in bilat shoulders/LUE to a reported 5/10 pain or less with activity.  Baseline: Eval: up to 10/10 pain with activity   Goal status: INITIAL  ASSESSMENT:  CLINICAL IMPRESSION: Pt continues to report good support from Ktaping to minimize L shoulder sublux.  Pt with good tolerance to therapeutic exercises this date in supine and sitting, and acknowledged good understanding of visual handouts for carry over of exercises at home.  Pt will continue to benefit from skilled OT to address  above noted deficits and work towards above noted goals in OT poc in order to maximize indep with daily tasks.   PERFORMANCE DEFICITS: in functional skills including ADLs, IADLs, coordination, dexterity, sensation, ROM, strength, pain, flexibility, Fine motor control, Gross motor control, mobility, balance, endurance, decreased knowledge of precautions, decreased knowledge of use of DME, skin integrity, and UE functional use, cognitive skills including emotional, and psychosocial skills including coping strategies, environmental adaptation, habits, and routines and behaviors.   IMPAIRMENTS: are limiting patient from ADLs, IADLs, rest and  sleep, work, leisure, and social participation.   CO-MORBIDITIES: has co-morbidities such as anxiety/depression, HTN, asthma, DM2, DDD (lumbar) that affects occupational performance. Patient will benefit from skilled OT to address above impairments and improve overall function.  MODIFICATION OR ASSISTANCE TO COMPLETE EVALUATION: Min-Moderate modification of tasks or assist with assess necessary to complete an evaluation.  OT OCCUPATIONAL PROFILE AND HISTORY: Detailed assessment: Review of records and additional review of physical, cognitive, psychosocial history related to current functional performance.  CLINICAL DECISION MAKING: Moderate - several treatment options, min-mod task modification necessary  REHAB POTENTIAL: Good  EVALUATION COMPLEXITY: Moderate    PLAN:  OT FREQUENCY: 2x/week  OT DURATION: 12 weeks  PLANNED INTERVENTIONS: 97168 OT Re-evaluation, 97535 self care/ADL training, 02889 therapeutic exercise, 97530 therapeutic activity, 97112 neuromuscular re-education, 97140 manual therapy, 97116 gait training, 02989 moist heat, 97010 cryotherapy, 97034 contrast bath, 97750 Physical Performance Testing, 02239 Orthotic Initial, 97763 Orthotic/Prosthetic subsequent, passive range of motion, balance training, functional mobility training, psychosocial  skills training, energy conservation, coping strategies training, patient/family education, and DME and/or AE instructions  RECOMMENDED OTHER SERVICES: None at this time  CONSULTED AND AGREED WITH PLAN OF CARE: Patient and family member/caregiver  PLAN FOR NEXT SESSION: see above  Inocente Blazing, MS, OTR/L   Inocente MARLA Blazing, OT 04/11/2024, 12:52 PM

## 2024-04-13 ENCOUNTER — Ambulatory Visit: Admitting: Occupational Therapy

## 2024-04-13 ENCOUNTER — Ambulatory Visit

## 2024-04-13 ENCOUNTER — Encounter: Attending: Registered Nurse | Admitting: Registered Nurse

## 2024-04-13 VITALS — BP 111/76 | HR 103 | Ht 68.0 in | Wt 235.0 lb

## 2024-04-13 DIAGNOSIS — R2681 Unsteadiness on feet: Secondary | ICD-10-CM

## 2024-04-13 DIAGNOSIS — M5412 Radiculopathy, cervical region: Secondary | ICD-10-CM | POA: Diagnosis not present

## 2024-04-13 DIAGNOSIS — M542 Cervicalgia: Secondary | ICD-10-CM | POA: Insufficient documentation

## 2024-04-13 DIAGNOSIS — G959 Disease of spinal cord, unspecified: Secondary | ICD-10-CM

## 2024-04-13 DIAGNOSIS — R278 Other lack of coordination: Secondary | ICD-10-CM

## 2024-04-13 DIAGNOSIS — R262 Difficulty in walking, not elsewhere classified: Secondary | ICD-10-CM

## 2024-04-13 DIAGNOSIS — M6281 Muscle weakness (generalized): Secondary | ICD-10-CM

## 2024-04-13 NOTE — Therapy (Signed)
 OUTPATIENT PHYSICAL THERAPY NEURO TREATMENT   Patient Name: Eileen Chaney MRN: [id number] DOB:08-13-1976, 48 y.o., female Today's Date: 04/14/2024   PCP: Epifanio Alm SQUIBB REFERRING PROVIDER: Benjamen Sieving   END OF SESSION:  PT End of Session - 04/13/24 1541     Visit Number 3    Number of Visits 24    Date for PT Re-Evaluation 06/29/24    Progress Note Due on Visit 10    PT Start Time 1541    PT Stop Time 1614    PT Time Calculation (min) 33 min    Equipment Utilized During Treatment Gait belt    Activity Tolerance Patient tolerated treatment well    Behavior During Therapy WFL for tasks assessed/performed           Past Medical History:  Diagnosis Date   Anxiety    Arthritis    Asthma    Cervical myelopathy with cervical radiculopathy (HCC)    Complication of anesthesia    with c sections had a had time getting her sedated   DDD (degenerative disc disease), cervical    Depression    Diabetes mellitus without complication (HCC)    type 2   Family history of adverse reaction to anesthesia    mother hard to wake up   Fibromyalgia    Hyperlipidemia    Hypertension    Idiopathic urticaria    Pneumonia    PONV (postoperative nausea and vomiting)    Undifferentiated inflammatory arthritis    Past Surgical History:  Procedure Laterality Date   ABDOMINAL HYSTERECTOMY  2018   uterine fibroids   ANKLE SURGERY Right 2022   ligament repair   ANTERIOR CERVICAL DECOMP/DISCECTOMY FUSION N/A 11/12/2021   Procedure: C3-5 ANTERIOR CERVICAL DISCECTOMY AND FUSION (GLOBUS HEDRON);  Surgeon: Clois Fret, MD;  Location: [facility];  Service: Neurosurgery;  Laterality: N/A;   CERVICAL WOUND DEBRIDEMENT N/A 01/20/2022   Procedure: posterior wound debridment;  Surgeon: Clois Fret, MD;  Location: [facility];  Service: Neurosurgery;  Laterality: N/A;   CESAREAN SECTION     X3 2000, 2001, 2009   DILATION AND CURETTAGE OF UTERUS     POSTERIOR CERVICAL  FUSION/FORAMINOTOMY N/A 03/15/2024   Procedure: POSTERIOR CERVICAL FUSION/FORAMINOTOMY LEVEL 4;  Surgeon: Clois Fret, MD;  Location: [facility];  Service: Neurosurgery;  Laterality: N/A;  C4-7 Posterior Spinal Decompression C4-7 Posterior Spinal Instrumentation C5-7 Fusion   POSTERIOR CERVICAL LAMINECTOMY N/A 01/07/2022   Procedure: OPEN C5-7 POSTERIOR DECOMPRESSION;  Surgeon: Clois Fret, MD;  Location: [facility];  Service: Neurosurgery;  Laterality: N/A;   TONSILLECTOMY  2000   WISDOM TOOTH EXTRACTION     Patient Active Problem List   Diagnosis Date Noted   Anxiety with depression 03/24/2024   Cervical myelopathy (HCC) 03/15/2024   Spinal stenosis in cervical region 03/15/2024   Cervical radiculopathy 03/15/2024   S/P cervical spinal fusion 03/15/2024   Wound infection after surgery 01/20/2022   Gestational diabetes 06/02/2020   Arthritis 10/11/2019   DDD (degenerative disc disease), lumbar 10/11/2019   Hyperlipidemia 10/11/2019   Idiopathic urticaria 10/11/2019   Recurrent major depressive disorder, in partial remission (HCC) 10/11/2019   Spinal stenosis of lumbar region without neurogenic claudication 10/11/2019   Type 2 diabetes mellitus without complication, with long-term current use of insulin  (HCC) 10/11/2019   Elevated lactic acid level 09/29/2017   Leukocytosis 09/29/2017   Diabetes (HCC) 09/28/2017   Hypertension 09/28/2017   Mild asthma without complication 09/28/2017   Morbid obesity (HCC) 09/28/2017  Thalassemia 09/28/2017   GAD (generalized anxiety disorder) 07/25/2014   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, unspecified 03/14/2014    ONSET DATE: 03/15/24  REFERRING DIAG: cervical myelopathy, s/p cervical spinal fusion   THERAPY DIAG:  Muscle weakness (generalized)  Other lack of coordination  Cervical myelopathy (HCC)  Unsteadiness on feet  Difficulty in walking, not elsewhere classified  Cervicalgia  Rationale for  Evaluation and Treatment: Rehabilitation  SUBJECTIVE:                                                                                                                                                                                             SUBJECTIVE STATEMENT: Pt reports feeling okay with cervical mobility and improving with balance and walking. States her left arm is her greatest concern right now.   Patient presents to PT s/p cervical surgery.  Pt accompanied by: family member  PERTINENT HISTORY: Patient underwent surgery on 03/15/24 for cervical myelopathy, cervical spinal stenosis, and cervical radiculopathy with the following procedure: Posterior Segmental Instrumentation C3-7, Posterolateral arthrodesis from C5-7, Cervical Laminectomy from C4-C6 for decompression of the spinal cord with bilateral foraminotomies and facetectomies at C3/4, C4/5, C5/6, and left foraminotomy at C6/7 . PMH significant for C3-5 ACDF 2023; posterior cervical laminectomy C5-7 2024. Patient went to inpatient rehab on 03/19/24 and discharged 04/01/24.  History significant for anxiety/depression as well as component of ADHD maintained on Ritalin , asthma, class I obesity with BMI 35.28 diabetes mellitus fibromyalgia hyperlipidemia hypertension posterior cervical laminectomy 01/07/2022, anterior cervical decompressive discectomy fusion 11/12/2021 complicated by cervical wound debridement 01/20/2022.   Reports it has been hard being home, its hard to move in her house, pain management has not been controlled. Sometimes wears soft collar.  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 Patient reports she is stumbling and walks off balance.   PAIN:  Are you having pain? Yes: NPRS scale: worst pain: 10/10, least 4/10 Pain location: L neck to shoulders and down to hands ; pain and numbness is primarily L side  Pain description: nerve pain Aggravating factors: walking, being upright, move L arm Relieving factors:  reclining,   PRECAUTIONS: Other: no > 10 lb   RED FLAGS: None   WEIGHT BEARING RESTRICTIONS: 10 lb   FALLS: Has patient fallen in last 6 months? No  LIVING ENVIRONMENT: Lives with: lives with their spouse Lives in: House/apartment Stairs: 2 level home, on first floor for now ; is having a ramp put in this week, has to go up stairs to bathe.  Has following equipment at home: Vannie - 4 wheeled  PLOF: Independent  PATIENT  GOALS: become independent and pain free.   OBJECTIVE:  Note: Objective measures were completed at Evaluation unless otherwise noted.  DIAGNOSTIC FINDINGS: IMPRESSION: 1. Stable C3-4 and C4-5 ACDF, with no change in orthopedic hardware. 2. Recent postsurgical changes from decompressive laminectomy and posterior fusion spanning C3 through C7, with anatomic alignment. 3. Subcutaneous gas and fluid along the deep margin of the posterior midline incision, consistent with postoperative seroma or hematoma. Surgical drain is seen along the dorsal margin of the central canal. 4. No acute displaced fracture.  COGNITION: Overall cognitive status: Within functional limits for tasks assessed   SENSATION: Decreased sensation LUE and LLE   COORDINATION: Heel slide test: decreased coordination LLE  POSTURE: rounded shoulders and L shoulder elevation       Right Left  Flexion 20*  Extension 9*  Side Bending 3 8  Rotation 14 27    LOWER EXTREMITY MMT:    MMT Right Eval Left Eval  Hip flexion 5 4-  Hip extension    Hip abduction 5 4  Hip adduction 5 4  Hip internal rotation    Hip external rotation    Knee flexion 5 4  Knee extension 5 4  Ankle dorsiflexion 5 4  Ankle plantarflexion 5 4  Ankle inversion    Ankle eversion    (Blank rows = not tested)   TRANSFERS: Sit to stand: SBA  Assistive device utilized: None     Stand to sit: SBA  Assistive device utilized: None     Chair to chair: SBA  Assistive device utilized: None         STAIRS: Assess next session  GAIT: Findings: Gait Characteristics: step through pattern, decreased arm swing- Left, decreased stance time- Left, decreased stride length, shuffling, and poor foot clearance- Left, Distance walked: 30 ft, Assistive device utilized:None, Level of assistance: SBA, and Comments: decreased coordination of LLE   FUNCTIONAL TESTS:  5 times sit to stand: perform next session.  Functional gait assessment:  Perform next session  Interpretation of scores: Non-Specific Older Adults Cutoff Score: <=22/30 = risk of falls Parkinson's Disease Cutoff score <15/30= fall risk (Hoehn & Yahr 1-4)  Minimally Clinically Important Difference (MCID)  Stroke (acute, subacute, and chronic) = MDC: 4.2 points Vestibular (acute) = MDC: 6 points Community Dwelling Older Adults =  MCID: 4 points Parkinson's Disease  =  MDC: 4.3 points  (Academy of Neurologic Physical Therapy (nd). Functional Gait Assessment. Retrieved from https://www.neuropt.org/docs/default-source/cpgs/core-outcome-measures/function-gait-assessment-pocket-guide-proof9-(2).pdf?sfvrsn=b34f35043_0.)  PATIENT SURVEYS:  ABC scale: The Activities-Specific Balance Confidence (ABC) Scale 0% 10 20 30  40 50 60 70 80 90 100% No confidence<->completely confident  "How confident are you that you will not lose your balance or become unsteady when you . . .   Date tested 04/06/24  Walk around the house 0%  2. Walk up or down stairs 10%  3. Bend over and pick up a slipper from in front of a closet floor 0%  4. Reach for a small can off a shelf at eye level 10%  5. Stand on tip toes and reach for something above your head 0%  6. Stand on a chair and reach for something 0%  7. Sweep the floor 0%  8. Walk outside the house to a car parked in the driveway 10%  9. Get into or out of a car 10%  10. Walk across a parking lot to the mall 0%  11. Walk up or down a ramp 0%  12. Walk in a crowded mall where  people rapidly walk past  you 0%  13. Are bumped into by people as you walk through the mall 0%  14. Step onto or off of an escalator while you are holding onto the railing 0%  15. Step onto or off an escalator while holding onto parcels such that you cannot hold onto the railing 0%  16. Walk outside on icy sidewalks 0%  Total: #/16 2.5    NDI:  NECK DISABILITY INDEX  Date: Eval  Score  Pain intensity 2 = The pain is moderate at the moment  2. Personal care (washing, dressing, etc.) 4 =  I need help every day in most aspects of self care  3. Lifting 5 = I cannot lift or carry anything   4. Reading 4 =  I can hardly read at all because of severe pain in my neck  5. Headaches 0 = I have no headaches at all  6. Concentration 2 = I have a fair degree of difficulty in concentrating when I want to  7. Work 0 =  I can do as much work as I want to  8. Driving 5 = I can't drive my car at all  9. Sleeping 4 = My sleep is greatly disturbed (3-5 hrs sleepless)   10. Recreation 5 = I can't do any recreation activities at all  Total 31/50   Minimum Detectable Change (90% confidence): 5 points or 10% points                                                                                                                              TREATMENT DATE: 04/14/24    Therex:  Review of AROM Cervical ROM- To be performed as pain free as possible - Cervical Flex/Ext, L/R rotation; L/R lateral Flex- Instructed to perform daily as able -Chin tuck hold 5 sec x 10  NMR:  Tandem standing at support bar- patient able to hold up to 30 sec each LE with initial mild unsteadiness Staggered standing with horizontal and vertical head motions (within pain- free cervical ROM) x 10 reps each - mild unsteadiness.  Tandem on foam- standing and holding for approx 30 sec - mostly ankle righting reaction.  Scap retraction for postural awarness RTB single arm secondary to minimal ability to perform on LUE- x 10 reps. Standing shoulder ext- RTB x 10  reps    Self care/Home management: Added below to HEP today and issued handout         Treatment times today are shorter d/t late arrival of pt.   PATIENT EDUCATION: Education details: goals, POC, HEP  Person educated: Patient Education method: Explanation, Demonstration, Tactile cues, and Verbal cues Education comprehension: verbalized understanding, returned demonstration, verbal cues required, tactile cues required, and needs further education  HOME EXERCISE PROGRAM:  Access Code: [email or web address] URL: [email or web address] Date: 04/13/2024 Prepared by: Reyes London  Exercises - Tandem Walking with Counter Support  - 3 x weekly -  3-5 sets - 5 length of counter and back - Seated Cervical Retraction  - 3 x weekly - 3 sets - 10 reps - Standing Shoulder Extension with Resistance  - 3 x weekly - 3 sets - 10 reps - Seated Shoulder Row with Anchored Resistance  - 3 x weekly - 3 sets - 10 reps    Access Code: [email or web address] URL: [email or web address] Date: 04/08/2024 Prepared by: Lonni Gainer Exercises - Seated Scapular Retraction  - 1-2 x daily - 7 x weekly - 3 sets - 30 reps - Supine Cervical Rotation AROM on Pillow  - 1 x daily - 7 x weekly - 3 sets - 20 reps - Supine Cervical Sidebending  - 1 x daily - 7 x weekly - 3 sets - 20 reps   GOALS: Goals reviewed with patient? Yes  SHORT TERM GOALS: Target date: 05/04/2024    Patient will be independent in home exercise program to improve strength/mobility for better functional independence with ADLs. Baseline: give next session  Goal status: INITIAL    LONG TERM GOALS: Target date: 06/29/2024   Patient will report a worst pain of 3/10 on VAS in cervical spine to improve tolerance with ADLs and reduced symptoms with activities.  Baseline: 7/8: 10/10 Goal status: INITIAL  2.  Patient will increase Functional Gait Assessment score to >20/30 as to reduce fall risk and improve dynamic gait safety with  community ambulation Baseline: perform next session  Goal status: INITIAL  3.  Patient will reduce Neck Disability Index score to <10% to demonstrate minimal disability with ADL's including improved sleeping tolerance, sitting tolerance, etc for better mobility at home and work. Baseline: 31/50 Goal status: INITIAL  4.  Patient will increase ABC scale score >80% to demonstrate better functional mobility and better confidence with ADLs.  Baseline: 2.5% Goal status: INITIAL   ASSESSMENT:  CLINICAL IMPRESSION: Pt session was cut a little short today due to pt arriving late to scheduled appointment time. Patient is a 48 y.o. female who was seen today for physical therapy treatment for cervical myelopathy post surgery. Treatment focused on adding some basic cervical/postural/ and balance activities to HEP. She responded very well - able to stand in tandem well without significant difficulty and able to move her neck without significant pain. She was introduced to some postural awareness activities without issues today and HEP was provided. Pt will continue to benefit from skilled physical therapy intervention to address impairments, improve QOL, and attain therapy goals.    OBJECTIVE IMPAIRMENTS: Abnormal gait, decreased activity tolerance, decreased balance, decreased coordination, decreased mobility, difficulty walking, decreased ROM, decreased strength, hypomobility, impaired perceived functional ability, impaired flexibility, impaired sensation, impaired UE functional use, improper body mechanics, postural dysfunction, and pain.   ACTIVITY LIMITATIONS: carrying, lifting, bending, sitting, standing, squatting, sleeping, stairs, transfers, bed mobility, bathing, toileting, dressing, reach over head, hygiene/grooming, locomotion level, and caring for others  PARTICIPATION LIMITATIONS: meal prep, cleaning, laundry, personal finances, interpersonal relationship, driving, shopping, community activity,  occupation, and school  PERSONAL FACTORS: Age, Past/current experiences, Time since onset of injury/illness/exacerbation, and 3+ comorbidities: HTN, diabetes, cervical myelopathy and radiculopathy, DDD, GAD, HLD, depression, anxiety are also affecting patient's functional outcome.   REHAB POTENTIAL: Good  CLINICAL DECISION MAKING: Evolving/moderate complexity  EVALUATION COMPLEXITY: Moderate  PLAN:  PT FREQUENCY: 2x/week  PT DURATION: 12 weeks  PLANNED INTERVENTIONS: 97164- PT Re-evaluation, 97750- Physical Performance Testing, 97110-Therapeutic exercises, 97530- Therapeutic activity, [id number]- Neuromuscular re-education, 97535- Self Care, [id number]- Manual therapy, [id number]- Gait training, [phone]-  Canalith repositioning, [id number]- Electrical stimulation (unattended), [phone]- Electrical stimulation (manual), 02964- Ultrasound, [phone] (1-2 muscles), 20561 (3+ muscles)- Dry Needling, Patient/Family education, Balance training, Stair training, Taping, Joint mobilization, Spinal mobilization, Scar mobilization, Vestibular training, Visual/preceptual remediation/compensation, DME instructions, Cryotherapy, Moist heat, and Biofeedback  PLAN FOR NEXT SESSION: L LE strength and balance training, Progress HEP   Reyes LOISE London, PT 04/14/2024, 10:11 AM

## 2024-04-13 NOTE — Therapy (Signed)
 OUTPATIENT OCCUPATIONAL THERAPY NEURO TREATMENT NOTE  Patient Name: Eileen Chaney MRN: [id number] DOB:November 03, 1975, 48 y.o., female Today's Date: 04/13/2024  PCP: Dr. Alm Needle REFERRING PROVIDER: Toribio Pitch, PA-C  END OF SESSION:  OT End of Session - 04/13/24 1722     Visit Number 3    Number of Visits 24    Date for OT Re-Evaluation 06/29/24    OT Start Time 1615    OT Stop Time 1700    OT Time Calculation (min) 45 min    Activity Tolerance Patient tolerated treatment well    Behavior During Therapy WFL for tasks assessed/performed         Past Medical History:  Diagnosis Date   Anxiety    Arthritis    Asthma    Cervical myelopathy with cervical radiculopathy (HCC)    Complication of anesthesia    with c sections had a had time getting her sedated   DDD (degenerative disc disease), cervical    Depression    Diabetes mellitus without complication (HCC)    type 2   Family history of adverse reaction to anesthesia    mother hard to wake up   Fibromyalgia    Hyperlipidemia    Hypertension    Idiopathic urticaria    Pneumonia    PONV (postoperative nausea and vomiting)    Undifferentiated inflammatory arthritis    Past Surgical History:  Procedure Laterality Date   ABDOMINAL HYSTERECTOMY  2018   uterine fibroids   ANKLE SURGERY Right 2022   ligament repair   ANTERIOR CERVICAL DECOMP/DISCECTOMY FUSION N/A 11/12/2021   Procedure: C3-5 ANTERIOR CERVICAL DISCECTOMY AND FUSION (GLOBUS HEDRON);  Surgeon: Clois Fret, MD;  Location: [facility];  Service: Neurosurgery;  Laterality: N/A;   CERVICAL WOUND DEBRIDEMENT N/A 01/20/2022   Procedure: posterior wound debridment;  Surgeon: Clois Fret, MD;  Location: [facility];  Service: Neurosurgery;  Laterality: N/A;   CESAREAN SECTION     X3 2000, 2001, 2009   DILATION AND CURETTAGE OF UTERUS     POSTERIOR CERVICAL FUSION/FORAMINOTOMY N/A 03/15/2024   Procedure: POSTERIOR CERVICAL FUSION/FORAMINOTOMY  LEVEL 4;  Surgeon: Clois Fret, MD;  Location: [facility];  Service: Neurosurgery;  Laterality: N/A;  C4-7 Posterior Spinal Decompression C4-7 Posterior Spinal Instrumentation C5-7 Fusion   POSTERIOR CERVICAL LAMINECTOMY N/A 01/07/2022   Procedure: OPEN C5-7 POSTERIOR DECOMPRESSION;  Surgeon: Clois Fret, MD;  Location: [facility];  Service: Neurosurgery;  Laterality: N/A;   TONSILLECTOMY  2000   WISDOM TOOTH EXTRACTION     Patient Active Problem List   Diagnosis Date Noted   Anxiety with depression 03/24/2024   Cervical myelopathy (HCC) 03/15/2024   Spinal stenosis in cervical region 03/15/2024   Cervical radiculopathy 03/15/2024   S/P cervical spinal fusion 03/15/2024   Wound infection after surgery 01/20/2022   Gestational diabetes 06/02/2020   Arthritis 10/11/2019   DDD (degenerative disc disease), lumbar 10/11/2019   Hyperlipidemia 10/11/2019   Idiopathic urticaria 10/11/2019   Recurrent major depressive disorder, in partial remission (HCC) 10/11/2019   Spinal stenosis of lumbar region without neurogenic claudication 10/11/2019   Type 2 diabetes mellitus without complication, with long-term current use of insulin  (HCC) 10/11/2019   Elevated lactic acid level 09/29/2017   Leukocytosis 09/29/2017   Diabetes (HCC) 09/28/2017   Hypertension 09/28/2017   Mild asthma without complication 09/28/2017   Morbid obesity (HCC) 09/28/2017   Thalassemia 09/28/2017   GAD (generalized anxiety disorder) 07/25/2014   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 reports  having a good response to the KTape. Requested taping. Pt accompanied by: [name]  PERTINENT  HISTORY: Per chart: CIR d/c summary: [name] is a 48 y.o. right-handed female with history significant for anxiety/depression as well as component of ADHD maintained on Ritalin , asthma, class I obesity with BMI 35.28 diabetes mellitus fibromyalgia hyperlipidemia hypertension posterior cervical laminectomy 01/07/2022, anterior cervical decompressive discectomy fusion 11/12/2021 complicated by cervical wound debridement 01/20/2022.  Per chart review patient lives with daughter.  She has good support from her family.  Two-level home bed and bath on main level independent community ambulator.  Presented to [facility] 03/15/2024 with increasing difficulty with dexterity in her hands as well as poor balance.  She was having trouble using her phone as well as difficulty with buttons and clasping her necklaces.  She continued to have ongoing neck and left arm and shoulder pain with numbness as well as low back pain that extends into her buttocks.  She had denied any recent falls.  Recent imaging showed severe central canal stenosis at C4-5 C5-6 with foraminal stenosis from C3-7.  Most recent preoperative labs showed a hemoglobin of 12.8 hematocrit 40.3 on 03/03/2024 and unremarkable chemistries.  Underwent posterior segmental instrumentation of C3-7 as well as posterior lateral arthrodesis from C5-7 with cervical laminectomy from C4-C6 for decompression of the spinal cord with bilateral foraminotomies and facetectomies at C3/4 C4-5 C5-6 and left foraminotomies C6-7 03/15/2024 per Dr. Reeves Nine.  Laced in a cervical brace for comfort.  She did complete a course of Decadron  taper.  Patient control with pain with the use of Dilaudid  as well as Valium .  Lovenox  added for DVT prophylaxis 03/16/2024.  Follow-up imaging CT/MRI cervical spine 03/16/2024 due to complaints of increasing neck pain and acute left arm weakness showed noted degenerative changes moderate to severe spinal canal stenosis C3-4 with mild cord compression  without definitive cord signal abnormality.  Multilevel foraminal stenosis greatest and severe bilaterally at C3-4 C5-6 on the left at C6-7.  Moderate to severe spinal canal stenosis on the right at C6-7.  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  Therapy evaluations completed due to patient decreased functional mobility was admitted for a comprehensive rehab program.   PRECAUTIONS: Cervical, wears soft cervical collar prn   WEIGHT BEARING RESTRICTIONS: Yes ; no lifting >10 lbs   PAIN:  04/13/24: 3/10 pain in the left shoulder during standing, and pain in supine at approximately 45 degrees of ROM 04/08/24: 3/10 at rest, 8-10 with activity Are you having pain? Yes: NPRS scale: 4-5/10 pain at rest, 10/10 with activity Pain location: neck, shoulders down to hands Pain description: nerve pain Aggravating factors: walking, sitting, standing, moving L arm  Relieving factors: reclining  FALLS: Has patient fallen in last 6 months? 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   OBJECTIVE:  Note: Objective measures were completed at Evaluation unless otherwise noted.  HAND DOMINANCE: Right  ADLs: Overall ADLs: Caregiver assist for ADLs Transfers/ambulation related to ADLs: using rollator at home for balance and to take  pressure off shoulders and back Eating: difficulty cutting food Grooming: Max A with hair care d/t BUE weakness UB Dressing: Limited engagement of LUE LB Dressing: Difficulty hiking pants d/t limited engagement of the LUE Toileting: modified indep using R dominant/less affected arm Bathing: supv for tub/shower transfers; assist to wash upper R side of body; reports her long handled sponge is ineffective Tub Shower transfers: transfer tub bench in place but able to step over shower with supv and sits on bench prn throughout shower Equipment: Transfer tub bench  IADLs: Shopping: dep Light housekeeping: Max A; pt reports she can load washing machine but is unable to load/unload dishwasher d/t the bending required Meal Prep: Partner managing meals Community mobility: increased pain/limited Medication management: indep Financial management: indep  POSTURE COMMENTS:  rounded shoulders and forward head  ACTIVITY TOLERANCE: Activity tolerance: Limited tolerance for sitting and standing; more comfortable in supine (performed eval partially supine on mat to manage pain)  UPPER EXTREMITY ROM:    Active ROM Right eval Left eval  Shoulder flexion 55 30  Shoulder abduction 55 20  Shoulder adduction    Shoulder extension    Shoulder internal rotation WNL Unable to attempt behind back  Shoulder external rotation -20 Unable to attempt behind head  Elbow flexion    Elbow extension    Wrist flexion    Wrist extension    Wrist ulnar deviation    Wrist radial deviation    Wrist pronation    Wrist supination    (Blank rows = not tested)  UPPER EXTREMITY MMT:     MMT Right eval Left eval  Shoulder flexion 2+ 2-  Shoulder abduction 2+ 2-  Shoulder adduction    Shoulder extension    Shoulder internal rotation 4 2+  Shoulder external rotation 3- 2  Middle trapezius    Lower trapezius    Elbow flexion 4+ 1 to 2- (will assess more thoroughly next session)  Elbow extension 5 4  Wrist  flexion 5 4  Wrist extension 5 4  Wrist ulnar deviation    Wrist radial deviation    Wrist pronation 5 3+  Wrist supination 5 3-  (Blank rows = not tested)  HAND FUNCTION: Grip strength: Right: 68 lbs; Left: 34 lbs, Lateral pinch: Right: 13 lbs, Left: 7 lbs, and 3 point pinch: Right: 8 lbs, Left: 5 lbs  COORDINATION: 9 Hole Peg test: Right: 24 sec; Left: 47 sec  SENSATION: tingling/numbness in L hand digits 1-3, diminished up the L arm, no tingling in the RUE  COGNITION: Overall cognitive status: Within functional limits for tasks assessed  VISION: WNL  OBSERVATIONS:  Pt visibly in pain in upright sitting with facial grimacing, improved with transition to supine for portion of OT evaluation.  NEXT MD APPT: Return to neuro surgeon July 29.  TREATMENT DATE: 04/13/24   Neuro re-ed:  -Assessed left shoulder, and applied K-tape applied to L shoulder to minimize anterior sublux; application technique reviewed. Pt tolerated the application of the K-Tape well, and continues to verbalize increased comfort/support of the L shoulder with taping.    Therapeutic Exercise:  Supine:  -L shoulder PROM all planes (flex/abd 0-90*) in preparation for AAROM/AROM  -Left shoulder stabilization exercises were attempted in supine for holding shoulder flexion at 90 degrees with the elbow in full extension with support at the elbow, and shoulder. -Performed bicep ROM in gravity eliminated, progressing in increments to partial gravity, then to light resistance within the ROM plane -AAROM for bilateral shoulder flexion, and abduction at the flat tabletop surface with support tapering off. Pt. Reports this feels like a good stretch for Both UE's -AAROM for left shoulder horizontal abduction, and adduction with support required at the elbow. -Pt. performed left hand supination with  resistance. The left elbow placed at 90 degrees in sitting, and forearm supported on a pillow.    PATIENT EDUCATION: Education details: HEP progression Person educated: Patient and Child psychotherapist: Explanation, Demonstration, and Verbal cues Education comprehension: verbalized understanding, verbal cues required, and needs further education  HOME EXERCISE PROGRAM: -Bilateral AAROM Shoulder flexion and abduction tabletop exercises while seated. -AROM, and resistive forearm supination sitting with the LUE supported on pillows. -Supine left elbow flexion in gravity eliminated plane, progressing to partial gravity with resistance.  GOALS: Goals reviewed with patient? Yes  SHORT TERM GOALS: Target date: 05/18/24  Pt will be indep to perform HEP for improving BUE strength/flexibility/coordination for daily tasks. Baseline: Eval: Not yet initiated Goal status: INITIAL  LONG TERM GOALS: Target date: 06/29/24  Pt will increase L grip strength by 20 lbs or more in order to improve ability to grasp and carry heavier ADL supplies.  Baseline: Eval: L grip 34 lbs (R 68 lbs) Goal status: INITIAL  2.  Pt will increase L lateral pinch strength by 3 or more lbs to maintain grasp of fork for independence with cutting food. Baseline: Eval: L 7 lbs (R 13 lbs) Goal status: INITIAL  3.  Pt will increase R shoulder and LUE strength by 1 MM grade or more to better engage BUEs into ADLs.  Baseline: Eval: See above MMT; pt must use R hand to lift LUE up from lap to table top; L shoulder mobility BFL, R limited below 90*.  Pt predominately performing ADLs with R dominant arm. Goal status: INITIAL  4.  Pt will increase L hand FMC skills to manage clothing fasteners independently and manipulate small ADL supplies with reduced dropping.  Baseline: Eval: L 9 hole peg test 47 sec (R 24 sec)  5.  Pt will tolerate manual therapy, therapeutic modalities, and exercises to decrease pain in bilat  shoulders/LUE to a reported 5/10 pain or less with activity.  Baseline: Eval: up to 10/10 pain with activity   Goal status: INITIAL  ASSESSMENT:  CLINICAL IMPRESSION:  Pt. continues to present with a good response to the the K-Tape additional LUE support 2/2 subluxation.  Pt. requires support proximally, and assist through most of the exercises. Pt. Presents with pain in the left shoulder with standing, and then in supine at approximately 45 of shoulder flexion, and extension, as well as at 90 degrees of shoulder abduction. Pt. requires support, and assist through all of the exercises. Pt. presents with consistent activation of left elbow flexors  for flexion in gravity eliminated plane  with light resistance. Pt. continues to benefit from skilled OT to address above noted deficits and work towards above noted goals in OT poc in order to maximize indep with daily tasks.   PERFORMANCE DEFICITS: in functional skills including ADLs, IADLs, coordination, dexterity, sensation, ROM, strength, pain, flexibility, Fine motor control, Gross motor control, mobility, balance, endurance, decreased knowledge of precautions, decreased knowledge of use of DME, skin integrity, and UE functional use, cognitive skills including emotional, and psychosocial skills including coping strategies, environmental adaptation, habits, and routines and behaviors.   IMPAIRMENTS: are limiting patient from ADLs, IADLs, rest and sleep, work, leisure, and social participation.   CO-MORBIDITIES: has co-morbidities such as anxiety/depression, HTN, asthma, DM2, DDD (lumbar) that affects occupational performance. Patient will benefit from skilled OT to address above impairments and improve overall function.  MODIFICATION OR ASSISTANCE TO COMPLETE EVALUATION: Min-Moderate modification of tasks or assist with assess necessary to complete an evaluation.  OT OCCUPATIONAL PROFILE AND HISTORY: Detailed assessment: Review of records and  additional review of physical, cognitive, psychosocial history related to current functional performance.  CLINICAL DECISION MAKING: Moderate - several treatment options, min-mod task modification necessary  REHAB POTENTIAL: Good  EVALUATION COMPLEXITY: Moderate    PLAN:  OT FREQUENCY: 2x/week  OT DURATION: 12 weeks  PLANNED INTERVENTIONS: 97168 OT Re-evaluation, 97535 self care/ADL training, 02889 therapeutic exercise, 97530 therapeutic activity, 97112 neuromuscular re-education, 97140 manual therapy, 97116 gait training, 02989 moist heat, 97010 cryotherapy, 97034 contrast bath, 97750 Physical Performance Testing, 02239 Orthotic Initial, 97763 Orthotic/Prosthetic subsequent, passive range of motion, balance training, functional mobility training, psychosocial skills training, energy conservation, coping strategies training, patient/family education, and DME and/or AE instructions  RECOMMENDED OTHER SERVICES: None at this time  CONSULTED AND AGREED WITH PLAN OF CARE: Patient and family member/caregiver  PLAN FOR NEXT SESSION: see above  Richardson Otter, MS, OTR/L  04/13/2024, 5:26 PM

## 2024-04-13 NOTE — Progress Notes (Unsigned)
 Subjective:    Patient ID: Eileen Chaney, female    DOB: [birth year], 48 y.o.   MRN: [id number]  HPI: Eileen Chaney is a 48 y.o. female who returns for follow up appointment for chronic pain and medication refill. states *** pain is located in  ***. rates pain ***. current exercise regime is walking and performing stretching exercises.   Pain Inventory Average Pain 8 Pain Right Now 8 My pain is sharp, dull, tingling, and aching  LOCATION OF PAIN  neck, shoulder, hand, fingers, back, hip, toes  BOWEL Number of stools per week: 5-7 Oral laxative use Yes  Type of laxative benefiber Enema or suppository use No  History of colostomy No  Incontinent No   BLADDER Normal In and out cath, frequency . Able to self cath . Bladder incontinence No  Frequent urination No  Leakage with coughing No  Difficulty starting stream No  Incomplete bladder emptying No    Mobility ability to climb steps?  yes do you drive?  no  Function what is your job? full  Neuro/Psych weakness numbness tingling trouble walking  Prior Studies TC appt  Physicians involved in your care TC appt   No family history on file. Social History   Socioeconomic History   Marital status: Divorced    Spouse name: Not on file   Number of children: 3   Years of education: Not on file   Highest education level: Not on file  Occupational History   Not on file  Tobacco Use   Smoking status: Never   Smokeless tobacco: Never  Vaping Use   Vaping status: Never Used  Substance and Sexual Activity   Alcohol use: Yes    Alcohol/week: 0.0 - 1.0 standard drinks of alcohol    Comment: rarely   Drug use: Not Currently   Sexual activity: Yes  Other Topics Concern   Not on file  Social History Narrative   Lives with children   Social Drivers of Health   Financial Resource Strain: Low Risk  (07/30/2023)   Received from [facility]   Overall Financial Resource Strain (CARDIA)     Difficulty of Paying Living Expenses: Not hard at all  Food Insecurity: No Food Insecurity (03/15/2024)   Hunger Vital Sign    Worried About Running Out of Food in the Last Year: Never true    Ran Out of Food in the Last Year: Never true  Transportation Needs: No Transportation Needs (03/15/2024)   PRAPARE - Administrator, Civil Service (Medical): No    Lack of Transportation (Non-Medical): No  Physical Activity: Insufficiently Active (12/28/2022)   Received from [facility]   Exercise Vital Sign    On average, how many days per week do you engage in moderate to strenuous exercise (like a brisk walk)?: 2 days    On average, how many minutes do you engage in exercise at this level?: 20 min  Stress: No Stress Concern Present (12/28/2022)   Received from [facility] - Occupational Stress Questionnaire    Feeling of Stress : Only a little  Social Connections: Socially Integrated (12/28/2022)   Received from [facility]   Social Network    How would you rate your social network (family, work, friends)?: Good participation with social networks   Past Surgical History:  Procedure Laterality Date   ABDOMINAL HYSTERECTOMY  2018   uterine fibroids   ANKLE SURGERY Right 2022  ligament repair   ANTERIOR CERVICAL DECOMP/DISCECTOMY FUSION N/A 11/12/2021   Procedure: C3-5 ANTERIOR CERVICAL DISCECTOMY AND FUSION (GLOBUS HEDRON);  Surgeon: Clois Fret, MD;  Location: [facility];  Service: Neurosurgery;  Laterality: N/A;   CERVICAL WOUND DEBRIDEMENT N/A 01/20/2022   Procedure: posterior wound debridment;  Surgeon: Clois Fret, MD;  Location: [facility];  Service: Neurosurgery;  Laterality: N/A;   CESAREAN SECTION     X3 2000, 2001, 2009   DILATION AND CURETTAGE OF UTERUS     POSTERIOR CERVICAL FUSION/FORAMINOTOMY N/A 03/15/2024   Procedure: POSTERIOR CERVICAL FUSION/FORAMINOTOMY LEVEL 4;  Surgeon: Clois Fret, MD;   Location: [facility];  Service: Neurosurgery;  Laterality: N/A;  C4-7 Posterior Spinal Decompression C4-7 Posterior Spinal Instrumentation C5-7 Fusion   POSTERIOR CERVICAL LAMINECTOMY N/A 01/07/2022   Procedure: OPEN C5-7 POSTERIOR DECOMPRESSION;  Surgeon: Clois Fret, MD;  Location: [facility];  Service: Neurosurgery;  Laterality: N/A;   TONSILLECTOMY  2000   WISDOM TOOTH EXTRACTION     Past Medical History:  Diagnosis Date   Anxiety    Arthritis    Asthma    Cervical myelopathy with cervical radiculopathy (HCC)    Complication of anesthesia    with c sections had a had time getting her sedated   DDD (degenerative disc disease), cervical    Depression    Diabetes mellitus without complication (HCC)    type 2   Family history of adverse reaction to anesthesia    mother hard to wake up   Fibromyalgia    Hyperlipidemia    Hypertension    Idiopathic urticaria    Pneumonia    PONV (postoperative nausea and vomiting)    Undifferentiated inflammatory arthritis    BP 111/76   Pulse (!) 103   Ht 5' 8 (1.727 m)   Wt 235 lb (106.6 kg)   SpO2 94%   BMI 35.73 kg/m   Opioid Risk Score:   Fall Risk Score:  `1  Depression screen [facility] 2/9     01/19/2021   10:22 AM  Depression screen [facility] 2/9  Decreased Interest 0  Down, Depressed, Hopeless 0  PHQ - 2 Score 0      Review of Systems  Musculoskeletal:  Positive for back pain, gait problem and neck pain.       Shoulder, fingers, hip pain  Neurological:  Positive for weakness and numbness.  All other systems reviewed and are negative.      Objective:   Physical Exam        Assessment & Plan:

## 2024-04-15 ENCOUNTER — Ambulatory Visit: Admitting: Occupational Therapy

## 2024-04-15 ENCOUNTER — Ambulatory Visit

## 2024-04-15 DIAGNOSIS — M542 Cervicalgia: Secondary | ICD-10-CM | POA: Diagnosis not present

## 2024-04-15 DIAGNOSIS — M6281 Muscle weakness (generalized): Secondary | ICD-10-CM

## 2024-04-15 DIAGNOSIS — G959 Disease of spinal cord, unspecified: Secondary | ICD-10-CM

## 2024-04-15 DIAGNOSIS — R2681 Unsteadiness on feet: Secondary | ICD-10-CM

## 2024-04-15 DIAGNOSIS — R278 Other lack of coordination: Secondary | ICD-10-CM

## 2024-04-15 DIAGNOSIS — R262 Difficulty in walking, not elsewhere classified: Secondary | ICD-10-CM

## 2024-04-15 NOTE — Therapy (Addendum)
 OUTPATIENT OCCUPATIONAL THERAPY NEURO TREATMENT NOTE  Patient Name: Eileen Chaney MRN: [id number] DOB:21-Jun-1976, 48 y.o., female Today's Date: 04/15/2024  PCP: Dr. Alm Needle REFERRING PROVIDER: Toribio Pitch, PA-C  END OF SESSION:  OT End of Session - 04/15/24 1825     Visit Number 4    Number of Visits 24    Date for OT Re-Evaluation 06/29/24    OT Start Time 1541    OT Stop Time 1615    OT Time Calculation (min) 34 min    Activity Tolerance Patient tolerated treatment well    Behavior During Therapy WFL for tasks assessed/performed         Past Medical History:  Diagnosis Date   Anxiety    Arthritis    Asthma    Cervical myelopathy with cervical radiculopathy (HCC)    Complication of anesthesia    with c sections had a had time getting her sedated   DDD (degenerative disc disease), cervical    Depression    Diabetes mellitus without complication (HCC)    type 2   Family history of adverse reaction to anesthesia    mother hard to wake up   Fibromyalgia    Hyperlipidemia    Hypertension    Idiopathic urticaria    Pneumonia    PONV (postoperative nausea and vomiting)    Undifferentiated inflammatory arthritis    Past Surgical History:  Procedure Laterality Date   ABDOMINAL HYSTERECTOMY  2018   uterine fibroids   ANKLE SURGERY Right 2022   ligament repair   ANTERIOR CERVICAL DECOMP/DISCECTOMY FUSION N/A 11/12/2021   Procedure: C3-5 ANTERIOR CERVICAL DISCECTOMY AND FUSION (GLOBUS HEDRON);  Surgeon: Clois Fret, MD;  Location: [facility];  Service: Neurosurgery;  Laterality: N/A;   CERVICAL WOUND DEBRIDEMENT N/A 01/20/2022   Procedure: posterior wound debridment;  Surgeon: Clois Fret, MD;  Location: [facility];  Service: Neurosurgery;  Laterality: N/A;   CESAREAN SECTION     X3 2000, 2001, 2009   DILATION AND CURETTAGE OF UTERUS     POSTERIOR CERVICAL FUSION/FORAMINOTOMY N/A 03/15/2024   Procedure: POSTERIOR CERVICAL FUSION/FORAMINOTOMY  LEVEL 4;  Surgeon: Clois Fret, MD;  Location: [facility];  Service: Neurosurgery;  Laterality: N/A;  C4-7 Posterior Spinal Decompression C4-7 Posterior Spinal Instrumentation C5-7 Fusion   POSTERIOR CERVICAL LAMINECTOMY N/A 01/07/2022   Procedure: OPEN C5-7 POSTERIOR DECOMPRESSION;  Surgeon: Clois Fret, MD;  Location: [facility];  Service: Neurosurgery;  Laterality: N/A;   TONSILLECTOMY  2000   WISDOM TOOTH EXTRACTION     Patient Active Problem List   Diagnosis Date Noted   Anxiety with depression 03/24/2024   Cervical myelopathy (HCC) 03/15/2024   Spinal stenosis in cervical region 03/15/2024   Cervical radiculopathy 03/15/2024   S/P cervical spinal fusion 03/15/2024   Wound infection after surgery 01/20/2022   Gestational diabetes 06/02/2020   Arthritis 10/11/2019   DDD (degenerative disc disease), lumbar 10/11/2019   Hyperlipidemia 10/11/2019   Idiopathic urticaria 10/11/2019   Recurrent major depressive disorder, in partial remission (HCC) 10/11/2019   Spinal stenosis of lumbar region without neurogenic claudication 10/11/2019   Type 2 diabetes mellitus without complication, with long-term current use of insulin  (HCC) 10/11/2019   Elevated lactic acid level 09/29/2017   Leukocytosis 09/29/2017   Diabetes (HCC) 09/28/2017   Hypertension 09/28/2017   Mild asthma without complication 09/28/2017   Morbid obesity (HCC) 09/28/2017   Thalassemia 09/28/2017   GAD (generalized anxiety disorder) 07/25/2014   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 Reports having had a bad day with pain yesterday.  Pt accompanied by: Eileen Chaney  PERTINENT HISTORY: Per  chart: CIR d/c summary: Eileen Chaney is a 48 y.o. right-handed female with history significant for anxiety/depression as well as component of ADHD maintained on Ritalin , asthma, class I obesity with BMI 35.28 diabetes mellitus fibromyalgia hyperlipidemia hypertension posterior cervical laminectomy 01/07/2022, anterior cervical decompressive discectomy fusion 11/12/2021 complicated by cervical wound debridement 01/20/2022.  Per chart review patient lives with daughter.  She has good support from her family.  Two-level home bed and bath on main level independent community ambulator.  Presented to [facility]24 with increasing difficulty with dexterity in her hands as well as poor balance.  She was having trouble using her phone as well as difficulty with buttons and clasping her necklaces.  She continued to have ongoing neck and left arm and shoulder pain with numbness as well as low back pain that extends into her buttocks.  She had denied any recent falls.  Recent imaging showed severe central canal stenosis at C4-5 C5-6 with foraminal stenosis from C3-7.  Most recent preoperative labs showed a hemoglobin of 12.8 hematocrit 40.3 on 03/03/2024 and unremarkable chemistries.  Underwent posterior segmental instrumentation of C3-7 as well as posterior lateral arthrodesis from C5-7 with cervical laminectomy from C4-C6 for decompression of the spinal cord with bilateral foraminotomies and facetectomies at C3/4 C4-5 C5-6 and left foraminotomies C6-7 03/15/2024 per Dr. Reeves Chaney.  Laced in a cervical brace for comfort.  She did complete a course of Decadron  taper.  Patient control with pain with the use of Dilaudid  as well as Valium .  Lovenox  added for DVT prophylaxis 03/16/2024.  Follow-up imaging CT/MRI cervical spine 03/16/2024 due to complaints of increasing neck pain and acute left arm weakness showed noted degenerative changes moderate to severe spinal canal stenosis C3-4 with mild cord compression without  definitive cord signal abnormality.  Multilevel foraminal stenosis greatest and severe bilaterally at C3-4 C5-6 on the left at C6-7.  Moderate to severe spinal canal stenosis on the right at C6-7.  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  Therapy evaluations completed due to patient decreased functional mobility was admitted for a comprehensive rehab program.   PRECAUTIONS: Cervical, wears soft cervical collar prn   WEIGHT BEARING RESTRICTIONS: Yes ; no lifting >10 lbs   PAIN:  04/15/24: 2/10 pain  in the LUE in supine described as sharp nerve pain. 04/13/24: 3/10 pain in the left shoulder during standing, and pain in supine at approximately 45 degrees of ROM 04/08/24: 3/10 at rest, 8-10 with activity Are you having pain? Yes: NPRS scale: 4-5/10 pain at rest, 10/10 with activity Pain location: neck, shoulders down to hands Pain description: nerve pain Aggravating factors: walking, sitting, standing, moving L arm  Relieving factors: reclining  FALLS: Has patient fallen in last 6 months? 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   OBJECTIVE:  Note: Objective measures were completed at Evaluation unless otherwise noted.  HAND DOMINANCE: Right  ADLs: Overall ADLs: Caregiver assist for ADLs Transfers/ambulation  related to ADLs: using rollator at home for balance and to take pressure off shoulders and back Eating: difficulty cutting food Grooming: Max A with hair care d/t BUE weakness UB Dressing: Limited engagement of LUE LB Dressing: Difficulty hiking pants d/t limited engagement of the LUE Toileting: modified indep using R dominant/less affected arm Bathing: supv for tub/shower transfers; assist to wash upper R side of body; reports her long handled sponge is ineffective Tub Shower transfers: transfer tub bench in place but able to step over shower with supv and sits on bench prn throughout shower Equipment: Transfer tub bench  IADLs: Shopping: dep Light housekeeping: Max A; pt reports she can load washing machine but is unable to load/unload dishwasher d/t the bending required Meal Prep: Partner managing meals Community mobility: increased pain/limited Medication management: indep Financial management: indep  POSTURE COMMENTS:  rounded shoulders and forward head  ACTIVITY TOLERANCE: Activity tolerance: Limited tolerance for sitting and standing; more comfortable in supine (performed eval partially supine on mat to manage pain)  UPPER EXTREMITY ROM:    Active ROM Right eval Left eval  Shoulder flexion 55 30  Shoulder abduction 55 20  Shoulder adduction    Shoulder extension    Shoulder internal rotation WNL Unable to attempt behind back  Shoulder external rotation -20 Unable to attempt behind head  Elbow flexion    Elbow extension    Wrist flexion    Wrist extension    Wrist ulnar deviation    Wrist radial deviation    Wrist pronation    Wrist supination    (Blank rows = not tested)  UPPER EXTREMITY MMT:     MMT Right eval Left eval  Shoulder flexion 2+ 2-  Shoulder abduction 2+ 2-  Shoulder adduction    Shoulder extension    Shoulder internal rotation 4 2+  Shoulder external rotation 3- 2  Middle trapezius    Lower trapezius    Elbow flexion 4+ 1 to 2- (will  assess more thoroughly next session)  Elbow extension 5 4  Wrist flexion 5 4  Wrist extension 5 4  Wrist ulnar deviation    Wrist radial deviation    Wrist pronation 5 3+  Wrist supination 5 3-  (Blank rows = not tested)  HAND FUNCTION: Grip strength: Right: 68 lbs; Left: 34 lbs, Lateral pinch: Right: 13 lbs, Left: 7 lbs, and 3 point pinch: Right: 8 lbs, Left: 5 lbs  COORDINATION: 9 Hole Peg test: Right: 24 sec; Left: 47 sec  SENSATION: tingling/numbness in L hand digits 1-3, diminished up the L arm, no tingling in the RUE  COGNITION: Overall cognitive status: Within functional limits for tasks assessed  VISION: WNL  OBSERVATIONS:  Pt visibly in pain in upright sitting with facial grimacing, improved with transition to supine for portion of OT evaluation.  NEXT MD APPT: Return to neuro surgeon July 29.  TREATMENT DATE: 04/15/24   Therapeutic Exercise:  Supine:  -Left shoulder PROM through  all planes (flex/abd 0-90*) in preparation for AAROM/AROM requiring rest breaks. -bilateral AAROM for left shoulder horizontal abduction, and adduction with support required at the elbow. -Left shoulder stabilization exercises were attempted in supine for holding shoulder flexion at 90 degrees with the elbow in full extension with support at the elbow, and shoulder. -Performed bicep ROM in gravity eliminated, progressing in increments to partial gravity, then to light resistance within the ROM plane -Pt. performed left hand supination. The left elbow placed at 90 degrees in sitting, and forearm supported on a pillow.   Self-care:  -Pt. Education was provided about positioning of the LUE  in sitting, and supine 2/2 subluxation.   PATIENT EDUCATION: Education details: Positioning  Person educated: Patient and Medical laboratory scientific officer Education method: Explanation,  Demonstration, and Verbal cues Education comprehension: verbalized understanding, verbal cues required, and needs further education  HOME EXERCISE PROGRAM: --positioning for comfort  GOALS: Goals reviewed with patient? Yes  SHORT TERM GOALS: Target date: 05/18/24  Pt will be indep to perform HEP for improving BUE strength/flexibility/coordination for daily tasks. Baseline: Eval: Not yet initiated Goal status: INITIAL  LONG TERM GOALS: Target date: 06/29/24  Pt will increase L grip strength by 20 lbs or more in order to improve ability to grasp and carry heavier ADL supplies.  Baseline: Eval: L grip 34 lbs (R 68 lbs) Goal status: INITIAL  2.  Pt will increase L lateral pinch strength by 3 or more lbs to maintain grasp of fork for independence with cutting food. Baseline: Eval: L 7 lbs (R 13 lbs) Goal status: INITIAL  3.  Pt will increase R shoulder and LUE strength by 1 MM grade or more to better engage BUEs into ADLs.  Baseline: Eval: See above MMT; pt must use R hand to lift LUE up from lap to table top; L shoulder mobility BFL, R limited below 90*.  Pt predominately performing ADLs with R dominant arm. Goal status: INITIAL  4.  Pt will increase L hand [facility] skills to manage clothing fasteners independently and manipulate small ADL supplies with reduced dropping.  Baseline: Eval: L 9 hole peg test 47 sec (R 24 sec)  5.  Pt will tolerate manual therapy, therapeutic modalities, and exercises to decrease pain in bilat shoulders/LUE to a reported 5/10 pain or less with activity.  Baseline: Eval: up to 10/10 pain with activity   Goal status: INITIAL  ASSESSMENT:  CLINICAL IMPRESSION:  Pt. Reports having had a lot of pain yesterday 8-9/10 pain described as sharp nerve pain. Pt. Reports pain is improved today at 2/10 pain in supine, and 3-4/10 in standing/sitting. Pt. Education was provided about positioning of the LUE for comfort, and support. Pt. Is able to tolerate PROM to the   shoulder without pain. However, pain increases to 2/10 with attempts for [facility] described as nerve pain.  Pt. continues to require support, and assist through all of the exercises. Pt. continues to present with consistent activation of left elbow flexors for flexion in gravity eliminated plane with light resistance. Pt. continues to benefit from skilled OT to address above noted deficits and work towards above noted goals in OT poc in order to maximize indep with daily tasks.   PERFORMANCE DEFICITS: in functional skills including ADLs, IADLs, coordination, dexterity, sensation, ROM, strength, pain, flexibility, Fine motor control, Gross motor control, mobility, balance, endurance, decreased knowledge of precautions, decreased knowledge of use of DME,  skin integrity, and UE functional use, cognitive skills including emotional, and psychosocial skills including coping strategies, environmental adaptation, habits, and routines and behaviors.   IMPAIRMENTS: are limiting patient from ADLs, IADLs, rest and sleep, work, leisure, and social participation.   CO-MORBIDITIES: has co-morbidities such as anxiety/depression, HTN, asthma, DM2, DDD (lumbar) that affects occupational performance. Patient will benefit from skilled OT to address above impairments and improve overall function.  MODIFICATION OR ASSISTANCE TO COMPLETE EVALUATION: Min-Moderate modification of tasks or assist with assess necessary to complete an evaluation.  OT OCCUPATIONAL PROFILE AND HISTORY: Detailed assessment: Review of records and additional review of physical, cognitive, psychosocial history related to current functional performance.  CLINICAL DECISION MAKING: Moderate - several treatment options, min-mod task modification necessary  REHAB POTENTIAL: Good  EVALUATION COMPLEXITY: Moderate    PLAN:  OT FREQUENCY: 2x/week  OT DURATION: 12 weeks  PLANNED INTERVENTIONS: 97168 OT Re-evaluation, 97535 self care/ADL training, 02889  therapeutic exercise, 97530 therapeutic activity, 97112 neuromuscular re-education, 97140 manual therapy, 97116 gait training, 02989 moist heat, 97010 cryotherapy, 97034 contrast bath, 97750 Physical Performance Testing, 02239 Orthotic Initial, 97763 Orthotic/Prosthetic subsequent, passive range of motion, balance training, functional mobility training, psychosocial skills training, energy conservation, coping strategies training, patient/family education, and DME and/or AE instructions  RECOMMENDED OTHER SERVICES: None at this time  CONSULTED AND AGREED WITH PLAN OF CARE: Patient and family member/caregiver  PLAN FOR NEXT SESSION: see above  Richardson Otter, MS, OTR/L  04/15/2024, 6:26 PM

## 2024-04-15 NOTE — Therapy (Signed)
 OUTPATIENT PHYSICAL THERAPY NEURO TREATMENT   Patient Name: Eileen Chaney MRN: [id number] DOB:Sep 20, 1976, 48 y.o., female Today's Date: 04/16/2024   PCP: Epifanio Alm SQUIBB REFERRING PROVIDER: Benjamen Sieving   END OF SESSION:  PT End of Session - 04/15/24 1616     Visit Number 4    Number of Visits 24    Date for PT Re-Evaluation 06/29/24    Progress Note Due on Visit 10    PT Start Time 1618    PT Stop Time 1658    PT Time Calculation (min) 40 min    Equipment Utilized During Treatment Gait belt    Activity Tolerance Patient tolerated treatment well    Behavior During Therapy WFL for tasks assessed/performed           Past Medical History:  Diagnosis Date   Anxiety    Arthritis    Asthma    Cervical myelopathy with cervical radiculopathy (HCC)    Complication of anesthesia    with c sections had a had time getting her sedated   DDD (degenerative disc disease), cervical    Depression    Diabetes mellitus without complication (HCC)    type 2   Family history of adverse reaction to anesthesia    mother hard to wake up   Fibromyalgia    Hyperlipidemia    Hypertension    Idiopathic urticaria    Pneumonia    PONV (postoperative nausea and vomiting)    Undifferentiated inflammatory arthritis    Past Surgical History:  Procedure Laterality Date   ABDOMINAL HYSTERECTOMY  2018   uterine fibroids   ANKLE SURGERY Right 2022   ligament repair   ANTERIOR CERVICAL DECOMP/DISCECTOMY FUSION N/A 11/12/2021   Procedure: C3-5 ANTERIOR CERVICAL DISCECTOMY AND FUSION (GLOBUS HEDRON);  Surgeon: Clois Fret, MD;  Location: [facility];  Service: Neurosurgery;  Laterality: N/A;   CERVICAL WOUND DEBRIDEMENT N/A 01/20/2022   Procedure: posterior wound debridment;  Surgeon: Clois Fret, MD;  Location: [facility];  Service: Neurosurgery;  Laterality: N/A;   CESAREAN SECTION     X3 2000, 2001, 2009   DILATION AND CURETTAGE OF UTERUS     POSTERIOR CERVICAL  FUSION/FORAMINOTOMY N/A 03/15/2024   Procedure: POSTERIOR CERVICAL FUSION/FORAMINOTOMY LEVEL 4;  Surgeon: Clois Fret, MD;  Location: [facility];  Service: Neurosurgery;  Laterality: N/A;  C4-7 Posterior Spinal Decompression C4-7 Posterior Spinal Instrumentation C5-7 Fusion   POSTERIOR CERVICAL LAMINECTOMY N/A 01/07/2022   Procedure: OPEN C5-7 POSTERIOR DECOMPRESSION;  Surgeon: Clois Fret, MD;  Location: [facility];  Service: Neurosurgery;  Laterality: N/A;   TONSILLECTOMY  2000   WISDOM TOOTH EXTRACTION     Patient Active Problem List   Diagnosis Date Noted   Anxiety with depression 03/24/2024   Cervical myelopathy (HCC) 03/15/2024   Spinal stenosis in cervical region 03/15/2024   Cervical radiculopathy 03/15/2024   S/P cervical spinal fusion 03/15/2024   Wound infection after surgery 01/20/2022   Gestational diabetes 06/02/2020   Arthritis 10/11/2019   DDD (degenerative disc disease), lumbar 10/11/2019   Hyperlipidemia 10/11/2019   Idiopathic urticaria 10/11/2019   Recurrent major depressive disorder, in partial remission (HCC) 10/11/2019   Spinal stenosis of lumbar region without neurogenic claudication 10/11/2019   Type 2 diabetes mellitus without complication, with long-term current use of insulin  (HCC) 10/11/2019   Elevated lactic acid level 09/29/2017   Leukocytosis 09/29/2017   Diabetes (HCC) 09/28/2017   Hypertension 09/28/2017   Mild asthma without complication 09/28/2017   Morbid obesity (HCC) 09/28/2017  Thalassemia 09/28/2017   GAD (generalized anxiety disorder) 07/25/2014   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, unspecified 03/14/2014    ONSET DATE: 03/15/24  REFERRING DIAG: cervical myelopathy, s/p cervical spinal fusion   THERAPY DIAG:  Muscle weakness (generalized)  Other lack of coordination  Cervical myelopathy (HCC)  Unsteadiness on feet  Difficulty in walking, not elsewhere classified  Cervicalgia  Rationale for  Evaluation and Treatment: Rehabilitation  SUBJECTIVE:                                                                                                                                                                                             SUBJECTIVE STATEMENT: Pt reports Left arm worsening pain overall today and just completed OT session.   Patient presents to PT s/p cervical surgery.  Pt accompanied by: family member  PERTINENT HISTORY: Patient underwent surgery on 03/15/24 for cervical myelopathy, cervical spinal stenosis, and cervical radiculopathy with the following procedure: Posterior Segmental Instrumentation C3-7, Posterolateral arthrodesis from C5-7, Cervical Laminectomy from C4-C6 for decompression of the spinal cord with bilateral foraminotomies and facetectomies at C3/4, C4/5, C5/6, and left foraminotomy at C6/7 . PMH significant for C3-5 ACDF 2023; posterior cervical laminectomy C5-7 2024. Patient went to inpatient rehab on 03/19/24 and discharged 04/01/24.  History significant for anxiety/depression as well as component of ADHD maintained on Ritalin , asthma, class I obesity with BMI 35.28 diabetes mellitus fibromyalgia hyperlipidemia hypertension posterior cervical laminectomy 01/07/2022, anterior cervical decompressive discectomy fusion 11/12/2021 complicated by cervical wound debridement 01/20/2022.   Reports it has been hard being home, its hard to move in her house, pain management has not been controlled. Sometimes wears soft collar.  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 Patient reports she is stumbling and walks off balance.   PAIN:  Are you having pain? Yes: NPRS scale: worst pain: 10/10, least 4/10 Pain location: L neck to shoulders and down to hands ; pain and numbness is primarily L side  Pain description: nerve pain Aggravating factors: walking, being upright, move L arm Relieving factors: reclining,   PRECAUTIONS: Other: no > 10 lb   RED  FLAGS: None   WEIGHT BEARING RESTRICTIONS: 10 lb   FALLS: Has patient fallen in last 6 months? No  LIVING ENVIRONMENT: Lives with: lives with their spouse Lives in: House/apartment Stairs: 2 level home, on first floor for now ; is having a ramp put in this week, has to go up stairs to bathe.  Has following equipment at home: Walker - 4 wheeled  PLOF: Independent  PATIENT GOALS: become independent and pain free.   OBJECTIVE:  Note: Objective measures were completed at Evaluation unless otherwise noted.  DIAGNOSTIC FINDINGS: IMPRESSION: 1. Stable C3-4 and C4-5 ACDF, with no change in orthopedic hardware. 2. Recent postsurgical changes from decompressive laminectomy and posterior fusion spanning C3 through C7, with anatomic alignment. 3. Subcutaneous gas and fluid along the deep margin of the posterior midline incision, consistent with postoperative seroma or hematoma. Surgical drain is seen along the dorsal margin of the central canal. 4. No acute displaced fracture.  COGNITION: Overall cognitive status: Within functional limits for tasks assessed   SENSATION: Decreased sensation LUE and LLE   COORDINATION: Heel slide test: decreased coordination LLE  POSTURE: rounded shoulders and L shoulder elevation       Right Left  Flexion 20*  Extension 9*  Side Bending 3 8  Rotation 14 27    LOWER EXTREMITY MMT:    MMT Right Eval Left Eval  Hip flexion 5 4-  Hip extension    Hip abduction 5 4  Hip adduction 5 4  Hip internal rotation    Hip external rotation    Knee flexion 5 4  Knee extension 5 4  Ankle dorsiflexion 5 4  Ankle plantarflexion 5 4  Ankle inversion    Ankle eversion    (Blank rows = not tested)   TRANSFERS: Sit to stand: SBA  Assistive device utilized: None     Stand to sit: SBA  Assistive device utilized: None     Chair to chair: SBA  Assistive device utilized: None        STAIRS: Assess next session  GAIT: Findings: Gait  Characteristics: step through pattern, decreased arm swing- Left, decreased stance time- Left, decreased stride length, shuffling, and poor foot clearance- Left, Distance walked: 30 ft, Assistive device utilized:None, Level of assistance: SBA, and Comments: decreased coordination of LLE   FUNCTIONAL TESTS:  5 times sit to stand: perform next session.  Functional gait assessment:  Perform next session  Interpretation of scores: Non-Specific Older Adults Cutoff Score: <=22/30 = risk of falls Parkinson's Disease Cutoff score <15/30= fall risk (Hoehn & Yahr 1-4)  Minimally Clinically Important Difference (MCID)  Stroke (acute, subacute, and chronic) = MDC: 4.2 points Vestibular (acute) = MDC: 6 points Community Dwelling Older Adults =  MCID: 4 points Parkinson's Disease  =  MDC: 4.3 points  (Academy of Neu[employer]ic Physical Therapy (nd). Functional Gait Assessment. Retrieved from https://www.neuropt.org/docs/default-source/cpgs/core-outcome-measures/function-gait-assessment-pocket-guide-proof9-(2).pdf?sfvrsn=b44f35043_0.)  PATIENT SURVEYS:  ABC scale: The Activities-Specific Balance Confidence (ABC) Scale 0% 10 20 30  40 50 60 70 80 90 100% No confidence<->completely confident  "How confident are you that you will not lose your balance or become unsteady when you . . .   Date tested 04/06/24  Walk around the house 0%  2. Walk up or down stairs 10%  3. Bend over and pick up a slipper from in front of a closet floor 0%  4. Reach for a small can off a shelf at eye level 10%  5. Stand on tip toes and reach for something above your head 0%  6. Stand on a chair and reach for something 0%  7. Sweep the floor 0%  8. Walk outside the house to a car parked in the driveway 10%  9. Get into or out of a car 10%  10. Walk across a parking lot to the mall 0%  11. Walk up or down a ramp 0%  12. Walk in a crowded mall where people rapidly walk past you 0%  13. Are bumped  into by people as you walk through  the mall 0%  14. Step onto or off of an escalator while you are holding onto the railing 0%  15. Step onto or off an escalator while holding onto parcels such that you cannot hold onto the railing 0%  16. Walk outside on icy sidewalks 0%  Total: #/16 2.5    NDI:  NECK DISABILITY INDEX  Date: Eval  Score  Pain intensity 2 = The pain is moderate at the moment  2. Personal care (washing, dressing, etc.) 4 =  I need help every day in most aspects of self care  3. Lifting 5 = I cannot lift or carry anything   4. Reading 4 =  I can hardly read at all because of severe pain in my neck  5. Headaches 0 = I have no headaches at all  6. Concentration 2 = I have a fair degree of difficulty in concentrating when I want to  7. Work 0 =  I can do as much work as I want to  8. Driving 5 = I can't drive my car at all  9. Sleeping 4 = My sleep is greatly disturbed (3-5 hrs sleepless)   10. Recreation 5 = I can't do any recreation activities at all  Total 31/50   Minimum Detectable Change (90% confidence): 5 points or 10% points                                                                                                                              TREATMENT DATE: 04/16/24    *Place gait belt around patient to use as a sling today to support LUE.     NMR:   Step tap onto 1st then 2nd step without UE Support x 15 reps.  Side stepping over 1/2 foam x 15 reps alt LE High knee marching on airex pad x 15 rep alt LE  Single LE Stand x 20 sec eac LE - Unsteady initially yet improved with practice.  Sit to stand x 10 without UE Support  Tandem walk - fwd on tape line x 5 then added retro tandem 5 more passes.               PATIENT EDUCATION: Education details: goals, POC, HEP  Person educated: Patient Education method: Explanation, Demonstration, Tactile cues, and Verbal cues Education comprehension: verbalized understanding, returned demonstration, verbal cues required, tactile cues  required, and needs further education  HOME EXERCISE PROGRAM:  Access Code: [email or web address] URL: [email or web address] Date: 04/13/2024 Prepared by: Reyes London  Exercises - Tandem Walking with Counter Support  - 3 x weekly - 3-5 sets - 5 length of counter and back - Seated Cervical Retraction  - 3 x weekly - 3 sets - 10 reps - Standing Shoulder Extension with Resistance  - 3 x weekly - 3 sets - 10 reps - Seated Shoulder Row with Anchored Resistance  - 3 x weekly -  3 sets - 10 reps    Access Code: [email or web address] URL: [email or web address] Date: 04/08/2024 Prepared by: Lonni Gainer Exercises - Seated Scapular Retraction  - 1-2 x daily - 7 x weekly - 3 sets - 30 reps - Supine Cervical Rotation AROM on Pillow  - 1 x daily - 7 x weekly - 3 sets - 20 reps - Supine Cervical Sidebending  - 1 x daily - 7 x weekly - 3 sets - 20 reps   GOALS: Goals reviewed with patient? Yes  SHORT TERM GOALS: Target date: 05/04/2024    Patient will be independent in home exercise program to improve strength/mobility for better functional independence with ADLs. Baseline: give next session  Goal status: INITIAL    LONG TERM GOALS: Target date: 06/29/2024   Patient will report a worst pain of 3/10 on VAS in cervical spine to improve tolerance with ADLs and reduced symptoms with activities.  Baseline: 7/8: 10/10 Goal status: INITIAL  2.  Patient will increase Functional Gait Assessment score to >20/30 as to reduce fall risk and improve dynamic gait safety with community ambulation Baseline: perform next session; 7/10= 25/30 Goal status: INITIAL  3.  Patient will reduce Neck Disability Index score to <10% to demonstrate minimal disability with ADL's including improved sleeping tolerance, sitting tolerance, etc for better mobility at home and work. Baseline: 31/50 Goal status: INITIAL  4.  Patient will increase ABC scale score >80% to demonstrate better functional mobility and  better confidence with ADLs.  Baseline: 2.5% Goal status: INITIAL   ASSESSMENT:  CLINICAL IMPRESSION: Held on postural strengthening today as patient was experiencing more left LUE pain. She continues to progress quickly through some higher level balance activities. Utilized gait belt as sling for LUE support and patient able to navigate through several balance activities. Anticipate only minimal continued need for PT since receiving OT for left shoulder which is the most problematic area of concern now. May benefit from retesting of FGA next 1-2 session and if no other cervical concerns may be appropriate for skilled PT services very soon.      OBJECTIVE IMPAIRMENTS: Abnormal gait, decreased activity tolerance, decreased balance, decreased coordination, decreased mobility, difficulty walking, decreased ROM, decreased strength, hypomobility, impaired perceived functional ability, impaired flexibility, impaired sensation, impaired UE functional use, improper body mechanics, postural dysfunction, and pain.   ACTIVITY LIMITATIONS: carrying, lifting, bending, sitting, standing, squatting, sleeping, stairs, transfers, bed mobility, bathing, toileting, dressing, reach over head, hygiene/grooming, locomotion level, and caring for others  PARTICIPATION LIMITATIONS: meal prep, cleaning, laundry, personal finances, interpersonal relationship, driving, shopping, community activity, occupation, and school  PERSONAL FACTORS: Age, Past/current experiences, Time since onset of injury/illness/exacerbation, and 3+ comorbidities: HTN, diabetes, cervical myelopathy and radiculopathy, DDD, GAD, HLD, depression, anxiety are also affecting patient's functional outcome.   REHAB POTENTIAL: Good  CLINICAL DECISION MAKING: Evolving/moderate complexity  EVALUATION COMPLEXITY: Moderate  PLAN:  PT FREQUENCY: 2x/week  PT DURATION: 12 weeks  PLANNED INTERVENTIONS: 97164- PT Re-evaluation, 97750- Physical  Performance Testing, 97110-Therapeutic exercises, 97530- Therapeutic activity, [id number]- Neuromuscular re-education, 97535- Self Care, [id number]- Manual therapy, [phone]- Gait training, [phone]- Canalith repositioning, [id number]- Electrical stimulation (unattended), [phone]- Electrical stimulation (manual), [id number]- Ultrasound, [id number] (1-2 muscles), 20561 (3+ muscles)- Dry Needling, Patient/Family education, Balance training, Stair training, Taping, Joint mobilization, Spinal mobilization, Scar mobilization, Vestibular training, Visual/preceptual remediation/compensation, DME instructions, Cryotherapy, Moist heat, and Biofeedback  PLAN FOR NEXT SESSION: L LE strength and balance training, Progress HEP   Reyes LOISE London, PT 04/16/2024, 12:02 PM

## 2024-04-16 ENCOUNTER — Encounter: Payer: Self-pay | Admitting: Registered Nurse

## 2024-04-20 ENCOUNTER — Ambulatory Visit

## 2024-04-20 DIAGNOSIS — R278 Other lack of coordination: Secondary | ICD-10-CM

## 2024-04-20 DIAGNOSIS — M542 Cervicalgia: Secondary | ICD-10-CM | POA: Diagnosis not present

## 2024-04-20 DIAGNOSIS — G959 Disease of spinal cord, unspecified: Secondary | ICD-10-CM

## 2024-04-20 DIAGNOSIS — M6281 Muscle weakness (generalized): Secondary | ICD-10-CM

## 2024-04-20 NOTE — Therapy (Signed)
 OUTPATIENT OCCUPATIONAL THERAPY NEURO TREATMENT NOTE  Patient Name: Eileen Chaney MRN: [id number] DOB:10-28-1975, 48 y.o., female Today's Date: 04/20/2024  PCP: Dr. Alm Needle REFERRING PROVIDER: Toribio Pitch, PA-C  END OF SESSION:  OT End of Session - 04/20/24 1627     Visit Number 5    Number of Visits 24    Date for OT Re-Evaluation 06/29/24    OT Start Time 1406    OT Stop Time 1504    OT Time Calculation (min) 58 min    Activity Tolerance Patient tolerated treatment well    Behavior During Therapy WFL for tasks assessed/performed         Past Medical History:  Diagnosis Date   Anxiety    Arthritis    Asthma    Cervical myelopathy with cervical radiculopathy (HCC)    Complication of anesthesia    with c sections had a had time getting her sedated   DDD (degenerative disc disease), cervical    Depression    Diabetes mellitus without complication (HCC)    type 2   Family history of adverse reaction to anesthesia    mother hard to wake up   Fibromyalgia    Hyperlipidemia    Hypertension    Idiopathic urticaria    Pneumonia    PONV (postoperative nausea and vomiting)    Undifferentiated inflammatory arthritis    Past Surgical History:  Procedure Laterality Date   ABDOMINAL HYSTERECTOMY  2018   uterine fibroids   ANKLE SURGERY Right 2022   ligament repair   ANTERIOR CERVICAL DECOMP/DISCECTOMY FUSION N/A 11/12/2021   Procedure: C3-5 ANTERIOR CERVICAL DISCECTOMY AND FUSION (GLOBUS HEDRON);  Surgeon: Clois Fret, MD;  Location: [facility];  Service: Neurosurgery;  Laterality: N/A;   CERVICAL WOUND DEBRIDEMENT N/A 01/20/2022   Procedure: posterior wound debridment;  Surgeon: Clois Fret, MD;  Location: [facility];  Service: Neurosurgery;  Laterality: N/A;   CESAREAN SECTION     X3 2000, 2001, 2009   DILATION AND CURETTAGE OF UTERUS     POSTERIOR CERVICAL FUSION/FORAMINOTOMY N/A 03/15/2024   Procedure: POSTERIOR CERVICAL FUSION/FORAMINOTOMY  LEVEL 4;  Surgeon: Clois Fret, MD;  Location: [facility];  Service: Neurosurgery;  Laterality: N/A;  C4-7 Posterior Spinal Decompression C4-7 Posterior Spinal Instrumentation C5-7 Fusion   POSTERIOR CERVICAL LAMINECTOMY N/A 01/07/2022   Procedure: OPEN C5-7 POSTERIOR DECOMPRESSION;  Surgeon: Clois Fret, MD;  Location: [facility];  Service: Neurosurgery;  Laterality: N/A;   TONSILLECTOMY  2000   WISDOM TOOTH EXTRACTION     Patient Active Problem List   Diagnosis Date Noted   Anxiety with depression 03/24/2024   Cervical myelopathy (HCC) 03/15/2024   Spinal stenosis in cervical region 03/15/2024   Cervical radiculopathy 03/15/2024   S/P cervical spinal fusion 03/15/2024   Wound infection after surgery 01/20/2022   Gestational diabetes 06/02/2020   Arthritis 10/11/2019   DDD (degenerative disc disease), lumbar 10/11/2019   Hyperlipidemia 10/11/2019   Idiopathic urticaria 10/11/2019   Recurrent major depressive disorder, in partial remission (HCC) 10/11/2019   Spinal stenosis of lumbar region without neurogenic claudication 10/11/2019   Type 2 diabetes mellitus without complication, with long-term current use of insulin  (HCC) 10/11/2019   Elevated lactic acid level 09/29/2017   Leukocytosis 09/29/2017   Diabetes (HCC) 09/28/2017   Hypertension 09/28/2017   Mild asthma without complication 09/28/2017   Morbid obesity (HCC) 09/28/2017   Thalassemia 09/28/2017   GAD (generalized anxiety disorder) 07/25/2014   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, unspecified  03/14/2014   ONSET DATE: 03/15/24  REFERRING DIAG:  G95.9 (ICD-10-CM) - Cervical myelopathy (HCC)  Z98.1 (ICD-10-CM) - S/P cervical spinal fusion   THERAPY DIAG:  Muscle weakness (generalized)  Other lack of coordination  Cervical myelopathy (HCC)  Rationale for Evaluation and Treatment: Rehabilitation  SUBJECTIVE:  SUBJECTIVE STATEMENT: Pt reports being confused about which medications to  take for muscle pain and spasms d/t multiple medication changes.  (See tx note) Pt accompanied by: Partner, Peter  PERTINENT HISTORY: Per chart: CIR d/c summary: Eileen Chaney is a 48 y.o. right-handed female with history significant for anxiety/depression as well as component of ADHD maintained on Ritalin , asthma, class I obesity with BMI 35.28 diabetes mellitus fibromyalgia hyperlipidemia hypertension posterior cervical laminectomy 01/07/2022, anterior cervical decompressive discectomy fusion 11/12/2021 complicated by cervical wound debridement 01/20/2022.  Per chart review patient lives with daughter.  She has good support from her family.  Two-level home bed and bath on main level independent community ambulator.  Presented to [facility]/2024 with increasing difficulty with dexterity in her hands as well as poor balance.  She was having trouble using her phone as well as difficulty with buttons and clasping her necklaces.  She continued to have ongoing neck and left arm and shoulder pain with numbness as well as low back pain that extends into her buttocks.  She had denied any recent falls.  Recent imaging showed severe central canal stenosis at C4-5 C5-6 with foraminal stenosis from C3-7.  Most recent preoperative labs showed a hemoglobin of 12.8 hematocrit 40.3 on 03/03/2024 and unremarkable chemistries.  Underwent posterior segmental instrumentation of C3-7 as well as posterior lateral arthrodesis from C5-7 with cervical laminectomy from C4-C6 for decompression of the spinal cord with bilateral foraminotomies and facetectomies at C3/4 C4-5 C5-6 and left foraminotomies C6-7 03/15/2024 per Dr. Reeves Nine.  Laced in a cervical brace for comfort.  She did complete a course of Decadron  taper.  Patient control with pain with the use of Dilaudid  as well as Valium .  Lovenox  added for DVT prophylaxis 03/16/2024.  Follow-up imaging CT/MRI cervical spine 03/16/2024 due to complaints of increasing neck pain and  acute left arm weakness showed noted degenerative changes moderate to severe spinal canal stenosis C3-4 with mild cord compression without definitive cord signal abnormality.  Multilevel foraminal stenosis greatest and severe bilaterally at C3-4 C5-6 on the left at C6-7.  Moderate to severe spinal canal stenosis on the right at C6-7.  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  Therapy evaluations completed due to patient decreased functional mobility was admitted for a comprehensive rehab program.   PRECAUTIONS: Cervical, wears soft cervical collar prn   WEIGHT BEARING RESTRICTIONS: Yes ; no lifting >10 lbs   PAIN:  04/20/24: 2/10 L shoulder, 7-8/10 pain at times with activity 04/15/24: 2/10 pain  in the LUE in supine described as sharp nerve pain. 04/13/24: 3/10 pain in the left shoulder during standing, and pain in supine at approximately 45 degrees of ROM 04/08/24: 3/10 at rest, 8-10 with activity Are you having pain? Yes: NPRS scale: 4-5/10 pain at rest, 10/10 with activity Pain location: neck, shoulders down to hands Pain description: nerve pain Aggravating factors: walking, sitting, standing, moving L arm  Relieving factors: reclining  FALLS: Has patient fallen in last 6 months? 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   OBJECTIVE:  Note: Objective measures were completed at Evaluation unless otherwise noted.  HAND DOMINANCE: Right  ADLs: Overall ADLs: Caregiver assist for ADLs Transfers/ambulation related to ADLs: using rollator at home for balance and to take pressure off shoulders and back Eating: difficulty cutting food Grooming: Max A with hair care d/t BUE weakness UB Dressing: Limited engagement of LUE LB Dressing: Difficulty hiking pants d/t limited engagement of the LUE Toileting: modified indep using R dominant/less affected arm Bathing: supv for tub/shower transfers; assist to wash upper R side of body; reports her long handled sponge is ineffective Tub Shower transfers: transfer tub bench in place but able to step over shower with supv and sits on bench prn throughout shower Equipment: Transfer tub bench  IADLs: Shopping: dep Light housekeeping: Max A; pt reports she can load washing machine but is unable to load/unload dishwasher d/t the bending required Meal Prep: Partner managing meals Community mobility: increased pain/limited Medication management: indep Financial management: indep  POSTURE COMMENTS:  rounded shoulders and forward head  ACTIVITY TOLERANCE: Activity tolerance: Limited tolerance for sitting and standing; more comfortable in supine (performed eval partially supine on mat to manage pain)  UPPER EXTREMITY ROM:    Active ROM Right eval Left eval  Shoulder flexion 55 30  Shoulder abduction 55 20  Shoulder adduction    Shoulder extension    Shoulder internal rotation WNL Unable to attempt behind back  Shoulder external rotation -20 Unable to attempt behind head  Elbow flexion    Elbow extension    Wrist flexion    Wrist extension    Wrist ulnar deviation    Wrist radial deviation    Wrist pronation    Wrist supination    (Blank rows = not tested)  UPPER EXTREMITY MMT:     MMT Right eval Left eval  Shoulder flexion 2+ 2-  Shoulder abduction  2+ 2-  Shoulder adduction    Shoulder extension    Shoulder internal rotation 4 2+  Shoulder external rotation 3- 2  Middle trapezius    Lower trapezius    Elbow flexion 4+ 1 to 2- (will assess more thoroughly next session)  Elbow extension 5 4  Wrist flexion 5 4  Wrist extension 5 4  Wrist ulnar deviation    Wrist radial deviation    Wrist pronation 5 3+  Wrist supination 5 3-  (Blank rows = not tested)  HAND FUNCTION: Grip strength: Right: 68 lbs; Left: 34 lbs, Lateral pinch: Right: 13 lbs, Left: 7 lbs, and 3 point pinch: Right: 8 lbs, Left: 5 lbs  COORDINATION: 9 Hole Peg test: Right: 24 sec; Left: 47 sec  SENSATION: tingling/numbness in L hand digits 1-3, diminished up the L arm, no tingling in the RUE  COGNITION: Overall cognitive status: Within functional limits for tasks assessed  VISION: WNL  OBSERVATIONS:  Pt visibly in pain in upright sitting with facial grimacing, improved with transition to supine for portion of OT  evaluation.  NEXT MD APPT: Return to neuro surgeon July 29.                                                                                                                            TREATMENT DATE: 04/20/24  Self Care: -Condition management education:   -Ice 20 min for neck pain; defer to MD when able to transition to heat on neck post sx  -Recommended heat x20 min for R/L shoulder pain prn for muscle relaxation/pain management -Recommendation for taking muscle relaxer prior to therapy sessions to improve tolerance to activities; call MD or pharmacist for questions re: medications d/t multiple recent changes.   -Reviewed wearing recommendations for new shoulder abd sling, which pt purchased for L shoulder sublux.  OT advised wearing only for community mobility to support L shoulder; doff at home to allow opportunities to engage the LUE into daily tasks, and support LUE on pillow/table to reduce sublux at rest  -Participation in simulation of clothing  management, hiking/lowering waist of pants using looped gait belt.  Practice trials with vc for completing this tasks bimanually  Therapeutic Exercise: In supine: -bilat shoulder shrugs/retractions 10 reps x2 -AAROM for L shoulder all planes, limited to 90* or less to consider cervical precautions -LUE AAROM for elbow flex, L shoulder ER, flexion, abd, L chest press  -Reps of 7-10x2 of light resistance provided manually by OT to perform L elbow ext, L shoulder add, IR, and horiz abd/add -BUE light resistance with 1# dowel to perform chest press, tricep ext, shoulder flex, horiz abd/add x10-12 reps; OT providing min guard to support LUE throughout movement planes noted above In sitting: -OT assist to perform eccentric L bicep strengthening x10 reps -AAROM for L shoulder abd, chest press, IR behind back, and forearm pron/sup 7-10 reps x2 -Encouraged return to HEP for LUE mobility to tolerance, avoid exercises that cause pain  PATIENT EDUCATION: Education details: Sling positioning/wearing schedule Person educated: Patient and Medical laboratory scientific officer Education method: Explanation, Demonstration, and Verbal cues Education comprehension: verbalized understanding, verbal cues required, and needs further education  HOME EXERCISE PROGRAM: -positioning for comfort 04/20/24: -LUE AAROM/AROM to tolerance and within cervical precautions; shoulder shrugs and retraction in front of mirror, theraputty   GOALS: Goals reviewed with patient? Yes  SHORT TERM GOALS: Target date: 05/18/24  Pt will be indep to perform HEP for improving BUE strength/flexibility/coordination for daily tasks. Baseline: Eval: Not yet initiated Goal status: INITIAL  LONG TERM GOALS: Target date: 06/29/24  Pt will increase L grip strength by 20 lbs or more in order to improve ability to grasp and carry heavier ADL supplies.  Baseline: Eval: L grip 34 lbs (R 68 lbs) Goal status: INITIAL  2.  Pt will increase L lateral pinch strength  by 3 or more lbs to maintain grasp of fork for independence with cutting food. Baseline: Eval: L 7 lbs (R 13 lbs) Goal status: INITIAL  3.  Pt will increase R shoulder  and LUE strength by 1 MM grade or more to better engage BUEs into ADLs.  Baseline: Eval: See above MMT; pt must use R hand to lift LUE up from lap to table top; L shoulder mobility BFL, R limited below 90*.  Pt predominately performing ADLs with R dominant arm. Goal status: INITIAL  4.  Pt will increase L hand FMC skills to manage clothing fasteners independently and manipulate small ADL supplies with reduced dropping.  Baseline: Eval: L 9 hole peg test 47 sec (R 24 sec)  5.  Pt will tolerate manual therapy, therapeutic modalities, and exercises to decrease pain in bilat shoulders/LUE to a reported 5/10 pain or less with activity.  Baseline: Eval: up to 10/10 pain with activity   Goal status: INITIAL  ASSESSMENT:  CLINICAL IMPRESSION: Pt arrived with new L shoulder abd sling, which she reports has significantly improved her pain related to her L shoulder sublux.  OT reinforced importance of removing sling at home to allow opportunities to engage the LUE into daily tasks, and primarily wear for community mobility as needed to support L shoulder sublux.  OT explained L shoulder sling would not improve sublux, but rather support arm to reduce pain, and improvements in subluxation should come as proximal strength improves enough to support head of humerus in shoulder socket.  Pt reports she's only taking tylenol  for pain management at this time.  OT encouraged pt take her muscle relaxer prior to OT sessions to improve activity tolerance.  Pt. continues to benefit from skilled OT to address above noted deficits and work towards above noted goals in OT poc in order to maximize indep with daily tasks.   PERFORMANCE DEFICITS: in functional skills including ADLs, IADLs, coordination, dexterity, sensation, ROM, strength, pain, flexibility,  Fine motor control, Gross motor control, mobility, balance, endurance, decreased knowledge of precautions, decreased knowledge of use of DME, skin integrity, and UE functional use, cognitive skills including emotional, and psychosocial skills including coping strategies, environmental adaptation, habits, and routines and behaviors.   IMPAIRMENTS: are limiting patient from ADLs, IADLs, rest and sleep, work, leisure, and social participation.   CO-MORBIDITIES: has co-morbidities such as anxiety/depression, HTN, asthma, DM2, DDD (lumbar) that affects occupational performance. Patient will benefit from skilled OT to address above impairments and improve overall function.  MODIFICATION OR ASSISTANCE TO COMPLETE EVALUATION: Min-Moderate modification of tasks or assist with assess necessary to complete an evaluation.  OT OCCUPATIONAL PROFILE AND HISTORY: Detailed assessment: Review of records and additional review of physical, cognitive, psychosocial history related to current functional performance.  CLINICAL DECISION MAKING: Moderate - several treatment options, min-mod task modification necessary  REHAB POTENTIAL: Good  EVALUATION COMPLEXITY: Moderate    PLAN:  OT FREQUENCY: 2x/week  OT DURATION: 12 weeks  PLANNED INTERVENTIONS: 97168 OT Re-evaluation, 97535 self care/ADL training, 02889 therapeutic exercise, 97530 therapeutic activity, 97112 neuromuscular re-education, 97140 manual therapy, 97116 gait training, 02989 moist heat, 97010 cryotherapy, 97034 contrast bath, 97750 Physical Performance Testing, 02239 Orthotic Initial, 97763 Orthotic/Prosthetic subsequent, passive range of motion, balance training, functional mobility training, psychosocial skills training, energy conservation, coping strategies training, patient/family education, and DME and/or AE instructions  RECOMMENDED OTHER SERVICES: None at this time  CONSULTED AND AGREED WITH PLAN OF CARE: Patient and family  member/caregiver  PLAN FOR NEXT SESSION: see above  Inocente Blazing, MS, OTR/L  04/20/2024, 4:30 PM

## 2024-04-22 ENCOUNTER — Ambulatory Visit

## 2024-04-22 DIAGNOSIS — M6281 Muscle weakness (generalized): Secondary | ICD-10-CM

## 2024-04-22 DIAGNOSIS — M542 Cervicalgia: Secondary | ICD-10-CM | POA: Diagnosis not present

## 2024-04-22 DIAGNOSIS — G959 Disease of spinal cord, unspecified: Secondary | ICD-10-CM

## 2024-04-22 DIAGNOSIS — R278 Other lack of coordination: Secondary | ICD-10-CM

## 2024-04-22 NOTE — Therapy (Signed)
 OUTPATIENT OCCUPATIONAL THERAPY NEURO TREATMENT NOTE  Patient Name: Eileen Chaney MRN: [id number] DOB:07/15/1976, 48 y.o., female Today's Date: 04/22/2024  PCP: Dr. Alm Needle REFERRING PROVIDER: Toribio Pitch, PA-C  END OF SESSION:  OT End of Session - 04/22/24 1412     Visit Number 6    Number of Visits 24    Date for OT Re-Evaluation 06/29/24    OT Start Time 1320    OT Stop Time 1400    OT Time Calculation (min) 40 min    Activity Tolerance Patient tolerated treatment well    Behavior During Therapy WFL for tasks assessed/performed         Past Medical History:  Diagnosis Date   Anxiety    Arthritis    Asthma    Cervical myelopathy with cervical radiculopathy (HCC)    Complication of anesthesia    with c sections had a had time getting her sedated   DDD (degenerative disc disease), cervical    Depression    Diabetes mellitus without complication (HCC)    type 2   Family history of adverse reaction to anesthesia    mother hard to wake up   Fibromyalgia    Hyperlipidemia    Hypertension    Idiopathic urticaria    Pneumonia    PONV (postoperative nausea and vomiting)    Undifferentiated inflammatory arthritis    Past Surgical History:  Procedure Laterality Date   ABDOMINAL HYSTERECTOMY  2018   uterine fibroids   ANKLE SURGERY Right 2022   ligament repair   ANTERIOR CERVICAL DECOMP/DISCECTOMY FUSION N/A 11/12/2021   Procedure: C3-5 ANTERIOR CERVICAL DISCECTOMY AND FUSION (GLOBUS HEDRON);  Surgeon: Clois Fret, MD;  Location: [facility];  Service: Neurosurgery;  Laterality: N/A;   CERVICAL WOUND DEBRIDEMENT N/A 01/20/2022   Procedure: posterior wound debridment;  Surgeon: Clois Fret, MD;  Location: [facility];  Service: Neurosurgery;  Laterality: N/A;   CESAREAN SECTION     X3 2000, 2001, 2009   DILATION AND CURETTAGE OF UTERUS     POSTERIOR CERVICAL FUSION/FORAMINOTOMY N/A 03/15/2024   Procedure: POSTERIOR CERVICAL FUSION/FORAMINOTOMY  LEVEL 4;  Surgeon: Clois Fret, MD;  Location: [facility];  Service: Neurosurgery;  Laterality: N/A;  C4-7 Posterior Spinal Decompression C4-7 Posterior Spinal Instrumentation C5-7 Fusion   POSTERIOR CERVICAL LAMINECTOMY N/A 01/07/2022   Procedure: OPEN C5-7 POSTERIOR DECOMPRESSION;  Surgeon: Clois Fret, MD;  Location: [facility];  Service: Neurosurgery;  Laterality: N/A;   TONSILLECTOMY  2000   WISDOM TOOTH EXTRACTION     Patient Active Problem List   Diagnosis Date Noted   Anxiety with depression 03/24/2024   Cervical myelopathy (HCC) 03/15/2024   Spinal stenosis in cervical region 03/15/2024   Cervical radiculopathy 03/15/2024   S/P cervical spinal fusion 03/15/2024   Wound infection after surgery 01/20/2022   Gestational diabetes 06/02/2020   Arthritis 10/11/2019   DDD (degenerative disc disease), lumbar 10/11/2019   Hyperlipidemia 10/11/2019   Idiopathic urticaria 10/11/2019   Recurrent major depressive disorder, in partial remission (HCC) 10/11/2019   Spinal stenosis of lumbar region without neurogenic claudication 10/11/2019   Type 2 diabetes mellitus without complication, with long-term current use of insulin  (HCC) 10/11/2019   Elevated lactic acid level 09/29/2017   Leukocytosis 09/29/2017   Diabetes (HCC) 09/28/2017   Hypertension 09/28/2017   Mild asthma without complication 09/28/2017   Morbid obesity (HCC) 09/28/2017   Thalassemia 09/28/2017   GAD (generalized anxiety disorder) 07/25/2014   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, unspecified  03/14/2014   ONSET DATE: 03/15/24  REFERRING DIAG:  G95.9 (ICD-10-CM) - Cervical myelopathy (HCC)  Z98.1 (ICD-10-CM) - S/P cervical spinal fusion   THERAPY DIAG:  Cervical myelopathy (HCC)  Muscle weakness (generalized)  Other lack of coordination  Rationale for Evaluation and Treatment: Rehabilitation  SUBJECTIVE:  SUBJECTIVE STATEMENT: Pt reports the muscle relaxer has improved her pain,  continues to note difficulty/inability to wash back or put deodorant on R underarm.  Pt accompanied by: Son  PERTINENT HISTORY: Per chart: CIR d/c summary: Eileen Chaney is a 48 y.o. right-handed female with history significant for anxiety/depression as well as component of ADHD maintained on Ritalin , asthma, class I obesity with BMI 35.28 diabetes mellitus fibromyalgia hyperlipidemia hypertension posterior cervical laminectomy 01/07/2022, anterior cervical decompressive discectomy fusion 11/12/2021 complicated by cervical wound debridement 01/20/2022.  Per chart review patient lives with daughter.  She has good support from her family.  Two-level home bed and bath on main level independent community ambulator.  Presented to [facility] 03/15/2024 with increasing difficulty with dexterity in her hands as well as poor balance.  She was having trouble using her phone as well as difficulty with buttons and clasping her necklaces.  She continued to have ongoing neck and left arm and shoulder pain with numbness as well as low back pain that extends into her buttocks.  She had denied any recent falls.  Recent imaging showed severe central canal stenosis at C4-5 C5-6 with foraminal stenosis from C3-7.  Most recent preoperative labs showed a hemoglobin of 12.8 hematocrit 40.3 on 03/03/2024 and unremarkable chemistries.  Underwent posterior segmental instrumentation of C3-7 as well as posterior lateral arthrodesis from C5-7 with cervical laminectomy from C4-C6 for decompression of the spinal cord with bilateral foraminotomies and facetectomies at C3/4 C4-5 C5-6 and left foraminotomies C6-7 03/15/2024 per Dr. Reeves Nine.  Laced in a cervical brace for comfort.  She did complete a course of Decadron  taper.  Patient control with pain with the use of Dilaudid  as well as Valium .  Lovenox  added for DVT prophylaxis 03/16/2024.  Follow-up imaging CT/MRI cervical spine 03/16/2024 due to complaints of increasing neck pain and acute  left arm weakness showed noted degenerative changes moderate to severe spinal canal stenosis C3-4 with mild cord compression without definitive cord signal abnormality.  Multilevel foraminal stenosis greatest and severe bilaterally at C3-4 C5-6 on the left at C6-7.  Moderate to severe spinal canal stenosis on the right at C6-7.  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  Therapy evaluations completed due to patient decreased functional mobility was admitted for a comprehensive rehab program.   PRECAUTIONS: Cervical, wears soft cervical collar prn   WEIGHT BEARING RESTRICTIONS: Yes ; no lifting >10 lbs   PAIN:  04/20/24: 2/10 L shoulder, 7-8/10 pain at times with activity 04/15/24: 2/10 pain  in the LUE in supine described as sharp nerve pain. 04/13/24: 3/10 pain in the left shoulder during standing, and pain in supine at approximately 45 degrees of ROM 04/08/24: 3/10 at rest, 8-10 with activity Are you having pain? Yes: NPRS scale: 4/10 pain at rest, 8/10 with activity Pain location: neck, shoulders down to hands Pain description: nerve pain Aggravating factors: walking, sitting, standing, moving L arm  Relieving factors: reclining  FALLS: Has patient fallen in last 6 months? 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    OBJECTIVE:  Note: Objective measures were completed at Evaluation unless otherwise noted.  HAND DOMINANCE: Right  ADLs: Overall ADLs: Caregiver assist for ADLs Transfers/ambulation related to ADLs: using rollator at home for balance and to take pressure off shoulders and back Eating: difficulty cutting food Grooming: Max A with hair care d/t BUE weakness UB Dressing: Limited engagement of LUE LB Dressing: Difficulty hiking pants d/t limited engagement of the LUE Toileting: modified indep using R dominant/less affected arm Bathing: supv for tub/shower transfers; assist to wash upper R side of body; reports her long handled sponge is ineffective Tub Shower transfers: transfer tub bench in place but able to step over shower with supv and sits on bench prn throughout shower Equipment: Transfer tub bench  IADLs: Shopping: dep Light housekeeping: Max A; pt reports she can load washing machine but is unable to load/unload dishwasher d/t the bending required Meal Prep: Partner managing meals Community mobility: increased pain/limited Medication management: indep Financial management: indep  POSTURE COMMENTS:  rounded shoulders and forward head  ACTIVITY TOLERANCE: Activity tolerance: Limited tolerance for sitting and standing; more comfortable in supine (performed eval partially supine on mat to manage pain)  UPPER EXTREMITY ROM:    Active ROM Right eval Left eval  Shoulder flexion 55 30  Shoulder abduction 55 20  Shoulder adduction    Shoulder extension    Shoulder internal rotation WNL Unable to attempt behind back  Shoulder external rotation -20 Unable to attempt behind head  Elbow flexion    Elbow extension    Wrist flexion    Wrist extension    Wrist ulnar deviation    Wrist radial deviation    Wrist pronation    Wrist supination    (Blank rows = not tested)  UPPER EXTREMITY MMT:     MMT Right eval Left eval  Shoulder flexion 2+ 2-  Shoulder abduction 2+ 2-   Shoulder adduction    Shoulder extension    Shoulder internal rotation 4 2+  Shoulder external rotation 3- 2  Middle trapezius    Lower trapezius    Elbow flexion 4+ 1 to 2- (will assess more thoroughly next session)  Elbow extension 5 4  Wrist flexion 5 4  Wrist extension 5 4  Wrist ulnar deviation    Wrist radial deviation    Wrist pronation 5 3+  Wrist supination 5 3-  (Blank rows = not tested)  HAND FUNCTION: Grip strength: Right: 68 lbs; Left: 34 lbs, Lateral pinch: Right: 13 lbs, Left: 7 lbs, and 3 point pinch: Right: 8 lbs, Left: 5 lbs  COORDINATION: 9 Hole Peg test: Right: 24 sec; Left: 47 sec  SENSATION: tingling/numbness in L hand digits 1-3, diminished up the L arm, no tingling in the RUE  COGNITION: Overall cognitive status: Within functional limits for tasks assessed  VISION: WNL  OBSERVATIONS:  Pt visibly in pain in with facial grimacing during shoulder shrugs  NEXT MD APPT: Return to neuro surgeon  July 29.                                                                                                                            TREATMENT DATE: 04/22/24  Therapeutic Activity: -Pt performed B FMC tasks using the Grooved pegboard. Pt worked on grasping the grooved pegs from a horizontal position and worked on Comptroller, moving the pegs to a vertical position in the hand to prepare for placing them in the grooved slot. Reports B difficulty however completes ~10 pegs with R hand without compensation. Requires L elbow or wrist support and R lateral lean to achieve ~10 pegs with L hand. -Educated on positioning to minimize compensatory methods including lateral leans, may trial mirror use next session  Therapeutic Exercise: In supine: -L shoulder protraction with assist to maintain arm in ~80-90* flexion, 2 set x 10 reps each  -AAROM for L shoulder all planes, limited to ~90*flexion to consider cervical precautions -LUE place and hold exercises: maintains  elbow flexion at 90* with MIN tactile assist -Encouraged core exercises to minimize compensatory lateral leans for LUE ROM deficits  PATIENT EDUCATION: Education details: Sling positioning/wearing schedule Person educated: Patient and Medical laboratory scientific officer Education method: Explanation, Demonstration, and Verbal cues Education comprehension: verbalized understanding, verbal cues required, and needs further education  HOME EXERCISE PROGRAM: -positioning for comfort 04/20/24: -LUE AAROM/AROM to tolerance and within cervical precautions; shoulder shrugs and retraction in front of mirror, theraputty   GOALS: Goals reviewed with patient? Yes  SHORT TERM GOALS: Target date: 05/18/24  Pt will be indep to perform HEP for improving BUE strength/flexibility/coordination for daily tasks. Baseline: Eval: Not yet initiated Goal status: INITIAL  LONG TERM GOALS: Target date: 06/29/24  Pt will increase L grip strength by 20 lbs or more in order to improve ability to grasp and carry heavier ADL supplies.  Baseline: Eval: L grip 34 lbs (R 68 lbs) Goal status: INITIAL  2.  Pt will increase L lateral pinch strength by 3 or more lbs to maintain grasp of fork for independence with cutting food. Baseline: Eval: L 7 lbs (R 13 lbs) Goal status: INITIAL  3.  Pt will increase R shoulder and LUE strength by 1 MM grade or more to better engage BUEs into ADLs.  Baseline: Eval: See above MMT; pt must use R hand to lift LUE up from lap to table top; L shoulder mobility BFL, R limited below 90*.  Pt predominately performing ADLs with R dominant arm. Goal status: INITIAL  4.  Pt will increase L hand FMC skills to manage clothing fasteners independently and manipulate small ADL supplies with reduced dropping.  Baseline: Eval: L 9 hole peg test 47 sec (R 24 sec)  5.  Pt will tolerate manual therapy, therapeutic modalities, and exercises to decrease pain in bilat shoulders/LUE to a reported 5/10 pain or less  with activity.  Baseline: Eval: up to 10/10 pain with activity   Goal status: INITIAL  ASSESSMENT:  CLINICAL IMPRESSION: Pt reports use of muscle relaxer has improved pain. Reports ongoing difficulty with buttoning, hair management, deodorant in R underarm, and washing back. Good tolerance of supine exercises and agreeable to adding core exercises for abdominal strengthening to HEP for improved positioning. Requires L elbow or wrist support and R lateral lean to place ~10 grooved pegs into pegboard with L hand. Educated on increasing awareness of compensatory strategies to then be able to minimize compensatory patterns as able. Pt. continues to benefit from skilled OT to address above noted deficits and work towards above noted goals in OT poc in order to maximize indep with daily tasks.   PERFORMANCE DEFICITS: in functional skills including ADLs, IADLs, coordination, dexterity, sensation, ROM, strength, pain, flexibility, Fine motor control, Gross motor control, mobility, balance, endurance, decreased knowledge of precautions, decreased knowledge of use of DME, skin integrity, and UE functional use, cognitive skills including emotional, and psychosocial skills including coping strategies, environmental adaptation, habits, and routines and behaviors.   IMPAIRMENTS: are limiting patient from ADLs, IADLs, rest and sleep, work, leisure, and social participation.   CO-MORBIDITIES: has co-morbidities such as anxiety/depression, HTN, asthma, DM2, DDD (lumbar) that affects occupational performance. Patient will benefit from skilled OT to address above impairments and improve overall function.  MODIFICATION OR ASSISTANCE TO COMPLETE EVALUATION: Min-Moderate modification of tasks or assist with assess necessary to complete an evaluation.  OT OCCUPATIONAL PROFILE AND HISTORY: Detailed assessment: Review of records and additional review of physical, cognitive, psychosocial history related to current  functional performance.  CLINICAL DECISION MAKING: Moderate - several treatment options, min-mod task modification necessary  REHAB POTENTIAL: Good  EVALUATION COMPLEXITY: Moderate    PLAN:  OT FREQUENCY: 2x/week  OT DURATION: 12 weeks  PLANNED INTERVENTIONS: 97168 OT Re-evaluation, 97535 self care/ADL training, 02889 therapeutic exercise, 97530 therapeutic activity, 97112 neuromuscular re-education, 97140 manual therapy, 97116 gait training, 02989 moist heat, 97010 cryotherapy, 97034 contrast bath, 97750 Physical Performance Testing, 02239 Orthotic Initial, 97763 Orthotic/Prosthetic subsequent, passive range of motion, balance training, functional mobility training, psychosocial skills training, energy conservation, coping strategies training, patient/family education, and DME and/or AE instructions  RECOMMENDED OTHER SERVICES: None at this time  CONSULTED AND AGREED WITH PLAN OF CARE: Patient and family member/caregiver  PLAN FOR NEXT SESSION: see above  Elston Slot, M.S. OTR/L  04/22/24, 2:13 PM  ascom [phone]   04/22/2024, 2:13 PM

## 2024-04-23 ENCOUNTER — Other Ambulatory Visit: Payer: Self-pay

## 2024-04-23 DIAGNOSIS — M4802 Spinal stenosis, cervical region: Secondary | ICD-10-CM

## 2024-04-23 DIAGNOSIS — G959 Disease of spinal cord, unspecified: Secondary | ICD-10-CM

## 2024-04-26 ENCOUNTER — Ambulatory Visit: Admitting: Occupational Therapy

## 2024-04-26 ENCOUNTER — Ambulatory Visit

## 2024-04-26 DIAGNOSIS — M6281 Muscle weakness (generalized): Secondary | ICD-10-CM

## 2024-04-26 DIAGNOSIS — M542 Cervicalgia: Secondary | ICD-10-CM

## 2024-04-26 DIAGNOSIS — G959 Disease of spinal cord, unspecified: Secondary | ICD-10-CM

## 2024-04-26 DIAGNOSIS — R2681 Unsteadiness on feet: Secondary | ICD-10-CM

## 2024-04-26 DIAGNOSIS — R262 Difficulty in walking, not elsewhere classified: Secondary | ICD-10-CM

## 2024-04-26 DIAGNOSIS — R278 Other lack of coordination: Secondary | ICD-10-CM

## 2024-04-26 NOTE — Therapy (Signed)
 OUTPATIENT OCCUPATIONAL THERAPY NEURO TREATMENT NOTE  Patient Name: Eileen Chaney MRN: [id number] DOB:[birth year], 48 y.o., female Today's Date: 04/26/2024  PCP: Dr. Alm Needle REFERRING PROVIDER: Toribio Pitch, PA-C  END OF SESSION:  OT End of Session - 04/26/24 1652     Visit Number 7    Number of Visits 24    Date for OT Re-Evaluation 06/29/24    OT Start Time 1404    OT Stop Time 1445    OT Time Calculation (min) 41 min    Activity Tolerance Patient tolerated treatment well    Behavior During Therapy WFL for tasks assessed/performed         Past Medical History:  Diagnosis Date   Anxiety    Arthritis    Asthma    Cervical myelopathy with cervical radiculopathy (HCC)    Complication of anesthesia    with c sections had a had time getting her sedated   DDD (degenerative disc disease), cervical    Depression    Diabetes mellitus without complication (HCC)    type 2   Family history of adverse reaction to anesthesia    mother hard to wake up   Fibromyalgia    Hyperlipidemia    Hypertension    Idiopathic urticaria    Pneumonia    PONV (postoperative nausea and vomiting)    Undifferentiated inflammatory arthritis    Past Surgical History:  Procedure Laterality Date   ABDOMINAL HYSTERECTOMY  2018   uterine fibroids   ANKLE SURGERY Right 2022   ligament repair   ANTERIOR CERVICAL DECOMP/DISCECTOMY FUSION N/A 11/12/2021   Procedure: C3-5 ANTERIOR CERVICAL DISCECTOMY AND FUSION (GLOBUS HEDRON);  Surgeon: Clois Fret, MD;  Location: [facility];  Service: Neurosurgery;  Laterality: N/A;   CERVICAL WOUND DEBRIDEMENT N/A 01/20/2022   Procedure: posterior wound debridment;  Surgeon: Clois Fret, MD;  Location: [facility];  Service: Neurosurgery;  Laterality: N/A;   CESAREAN SECTION     X3 2000, 2001, 2009   DILATION AND CURETTAGE OF UTERUS     POSTERIOR CERVICAL FUSION/FORAMINOTOMY N/A 03/15/2024   Procedure: POSTERIOR CERVICAL FUSION/FORAMINOTOMY  LEVEL 4;  Surgeon: Clois Fret, MD;  Location: [facility];  Service: Neurosurgery;  Laterality: N/A;  C4-7 Posterior Spinal Decompression C4-7 Posterior Spinal Instrumentation C5-7 Fusion   POSTERIOR CERVICAL LAMINECTOMY N/A 01/07/2022   Procedure: OPEN C5-7 POSTERIOR DECOMPRESSION;  Surgeon: Clois Fret, MD;  Location: [facility];  Service: Neurosurgery;  Laterality: N/A;   TONSILLECTOMY  2000   WISDOM TOOTH EXTRACTION     Patient Active Problem List   Diagnosis Date Noted   Anxiety with depression 03/24/2024   Cervical myelopathy (HCC) 03/15/2024   Spinal stenosis in cervical region 03/15/2024   Cervical radiculopathy 03/15/2024   S/P cervical spinal fusion 03/15/2024   Wound infection after surgery 01/20/2022   Gestational diabetes 06/02/2020   Arthritis 10/11/2019   DDD (degenerative disc disease), lumbar 10/11/2019   Hyperlipidemia 10/11/2019   Idiopathic urticaria 10/11/2019   Recurrent major depressive disorder, in partial remission (HCC) 10/11/2019   Spinal stenosis of lumbar region without neurogenic claudication 10/11/2019   Type 2 diabetes mellitus without complication, with long-term current use of insulin  (HCC) 10/11/2019   Elevated lactic acid level 09/29/2017   Leukocytosis 09/29/2017   Diabetes (HCC) 09/28/2017   Hypertension 09/28/2017   Mild asthma without complication 09/28/2017   Morbid obesity (HCC) 09/28/2017   Thalassemia 09/28/2017   GAD (generalized anxiety disorder) 07/25/2014   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 19th in a modified capacity. Pt accompanied by: Son  PERTINENT  HISTORY: Per chart: CIR d/c summary: Eileen Chaney is a 48 y.o. right-handed female with history significant for anxiety/depression as well as component of ADHD maintained on Ritalin , asthma, class I obesity with BMI 35.28 diabetes mellitus fibromyalgia hyperlipidemia hypertension posterior cervical laminectomy 01/07/2022, anterior cervical decompressive discectomy fusion 11/12/2021 complicated by cervical wound debridement 01/20/2022.  Per chart review patient lives with daughter.  She has good support from her family.  Two-level home bed and bath on main level independent community ambulator.  [facility] with increasing difficulty with dexterity in her hands as well as poor balance.  She was having trouble using her phone as well as difficulty with buttons and clasping her necklaces.  She continued to have ongoing neck and left arm and shoulder pain with numbness as well as low back pain that extends into her buttocks.  She had denied any recent falls.  Recent imaging showed severe central canal stenosis at C4-5 C5-6 with foraminal stenosis from C3-7.  Most recent preoperative labs showed a hemoglobin of 12.8 hematocrit 40.3 on 03/03/2024 and unremarkable chemistries.  Underwent posterior segmental instrumentation of C3-7 as well as posterior lateral arthrodesis from C5-7 with cervical laminectomy from C4-C6 for decompression of the spinal cord with bilateral foraminotomies and facetectomies at C3/4 C4-5 C5-6 and left foraminotomies C6-7 03/15/2024 per Dr. Reeves Nine.  Laced in a cervical brace for comfort.  She did complete a course of Decadron  taper.  Patient control with pain with the use of Dilaudid  as well as Valium .  Lovenox  added for DVT prophylaxis 03/16/2024.  Follow-up imaging CT/MRI cervical spine 03/16/2024 due to complaints of increasing neck pain and acute left arm weakness showed noted degenerative changes moderate to severe spinal canal stenosis C3-4 with mild cord compression  without definitive cord signal abnormality.  Multilevel foraminal stenosis greatest and severe bilaterally at C3-4 C5-6 on the left at C6-7.  Moderate to severe spinal canal stenosis on the right at C6-7.  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  Therapy evaluations completed due to patient decreased functional mobility was admitted for a comprehensive rehab program.   PRECAUTIONS: Cervical, wears soft cervical collar prn   WEIGHT BEARING RESTRICTIONS: Yes ; no lifting >10 lbs   PAIN: 7/18/225: 4-5/10 left shoulder following PT.  04/20/24: 2/10 L shoulder, 7-8/10 pain at times with activity 04/15/24: 2/10 pain  in the LUE in supine described as sharp nerve pain. 04/13/24: 3/10 pain in the left shoulder during standing, and pain in supine at approximately 45 degrees of ROM 04/08/24: 3/10 at rest, 8-10 with activity Are you having pain? Yes: NPRS scale: 4/10 pain at rest, 8/10 with activity Pain location: neck, shoulders down to hands Pain description: nerve pain Aggravating factors: walking, sitting, standing, moving L arm  Relieving factors: reclining  FALLS: Has patient fallen in last 6 months? 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   OBJECTIVE:  Note: Objective measures were completed at Evaluation unless  otherwise noted.  HAND DOMINANCE: Right  ADLs: Overall ADLs: Caregiver assist for ADLs Transfers/ambulation related to ADLs: using rollator at home for balance and to take pressure off shoulders and back Eating: difficulty cutting food Grooming: Max A with hair care d/t BUE weakness UB Dressing: Limited engagement of LUE LB Dressing: Difficulty hiking pants d/t limited engagement of the LUE Toileting: modified indep using R dominant/less affected arm Bathing: supv for tub/shower transfers; assist to wash upper R side of body; reports her long handled sponge is ineffective Tub Shower transfers: transfer tub bench in place but able to step over shower with supv and sits on bench prn throughout shower Equipment: Transfer tub bench  IADLs: Shopping: dep Light housekeeping: Max A; pt reports she can load washing machine but is unable to load/unload dishwasher d/t the bending required Meal Prep: Partner managing meals Community mobility: increased pain/limited Medication management: indep Financial management: indep  POSTURE COMMENTS:  rounded shoulders and forward head  ACTIVITY TOLERANCE: Activity tolerance: Limited tolerance for sitting and standing; more comfortable in supine (performed eval partially supine on mat to manage pain)  UPPER EXTREMITY ROM:    Active ROM Right eval Left eval  Shoulder flexion 55 30  Shoulder abduction 55 20  Shoulder adduction    Shoulder extension    Shoulder internal rotation WNL Unable to attempt behind back  Shoulder external rotation -20 Unable to attempt behind head  Elbow flexion    Elbow extension    Wrist flexion    Wrist extension    Wrist ulnar deviation    Wrist radial deviation    Wrist pronation    Wrist supination    (Blank rows = not tested)  UPPER EXTREMITY MMT:     MMT Right eval Left eval  Shoulder flexion 2+ 2-  Shoulder abduction 2+ 2-  Shoulder adduction    Shoulder extension    Shoulder internal rotation 4  2+  Shoulder external rotation 3- 2  Middle trapezius    Lower trapezius    Elbow flexion 4+ 1 to 2- (will assess more thoroughly next session)  Elbow extension 5 4  Wrist flexion 5 4  Wrist extension 5 4  Wrist ulnar deviation    Wrist radial deviation    Wrist pronation 5 3+  Wrist supination 5 3-  (Blank rows = not tested)  HAND FUNCTION: Grip strength: Right: 68 lbs; Left: 34 lbs, Lateral pinch: Right: 13 lbs, Left: 7 lbs, and 3 point pinch: Right: 8 lbs, Left: 5 lbs  COORDINATION: 9 Hole Peg test: Right: 24 sec; Left: 47 sec  SENSATION: tingling/numbness in L hand digits 1-3, diminished up the L arm, no tingling in the RUE  COGNITION: Overall cognitive status: Within functional limits for tasks assessed  VISION: WNL  OBSERVATIONS:  Pt visibly in pain in with facial grimacing during shoulder shrugs  NEXT MD APPT: Return to neuro surgeon July 29.  TREATMENT DATE: 04/26/24   Therapeutic Exercise: Seated:  -Scapular elevation, and depression, ab/adduction -AAROM at the tabletop for internal, and external rotation. -Shoulder flexion/extension AAROM with dowel/cane base blocked at the wall -Shoulder circles AAROM with the dowel/cane into wall- with base blacked at the wall -Elbow flexion, extension extension AAROM with dowel/cane into wall with base of dowel/cane supported at the wall.  In supine: -L shoulder protraction ~80-90* flexion, 2 set x 10 reps each  -AAROM for L shoulder all planes, limited to ~90* within cervical precaution restrictions.  Neuromuscular re-education:   -Facilitated right hand [facility]  skills grasping 1 sticks. Pt. worked on grasping each of the 1 sticks with the  2nd digit and thumb, and storing them in the palm of the hand. Pt. worked on translatory movements moving the the pegs from the palm to the tip of the 2nd digit and  thumb in preparation for placing them vertically in the [employer].    PATIENT EDUCATION: Education details:  Person educated: Patient and Child psychotherapist: Explanation, Demonstration, and Verbal cues Education comprehension: verbalized understanding, verbal cues required, and needs further education  HOME EXERCISE PROGRAM: 04/26/24:  HEP through [facility]: -AAROM at the tabletop for internal, and external rotation. -Shoulder flexion/extension AAROM with dowel/cane base blocked at the wall -Shoulder circles AAROM with the dowel/cane into wall- with base blacked at the wall -Elbow flexion, extension extension AAROM with dowel/cane into wall with base of dowel/cane supported at the wall.  04/20/24:-LUE AAROM/AROM to tolerance and within cervical precautions; shoulder shrugs and retraction in front of mirror, theraputty 04/15/24: -positioning for comfort 04/13/24: -Bilateral AAROM Shoulder flexion and abduction tabletop exercises while seated. -AROM, and resistive forearm supination sitting with the LUE supported on pillows. -Supine left elbow flexion in gravity eliminated plane, progressing to partial gravity with resistance. 04/08/24: Refer to treatment note for HEP details.  GOALS: Goals reviewed with patient? Yes  SHORT TERM GOALS: Target date: 05/18/24  Pt will be indep to perform HEP for improving BUE strength/flexibility/coordination for daily tasks. Baseline: Eval: Not yet initiated Goal status: INITIAL  LONG TERM GOALS: Target date: 06/29/24  Pt will increase L grip strength by 20 lbs or more in order to improve ability to grasp and carry heavier ADL supplies.  Baseline: Eval: L grip 34 lbs (R 68 lbs) Goal status: INITIAL  2.  Pt will increase L lateral pinch strength by 3 or more lbs to maintain grasp of fork for independence with cutting food. Baseline: Eval: L 7 lbs (R 13 lbs) Goal status: INITIAL  3.  Pt will increase R shoulder and LUE strength  by 1 MM grade or more to better engage BUEs into ADLs.  Baseline: Eval: See above MMT; pt must use R hand to lift LUE up from lap to table top; L shoulder mobility BFL, R limited below 90*.  Pt predominately performing ADLs with R dominant arm. Goal status: INITIAL  4.  Pt will increase L hand [facility] skills to manage clothing fasteners independently and manipulate small ADL supplies with reduced dropping.  Baseline: Eval: L 9 hole peg test 47 sec (R 24 sec)  5.  Pt will tolerate manual therapy, therapeutic modalities, and exercises to decrease pain in bilat shoulders/LUE to a reported 5/10 pain or less with activity.  Baseline: Eval: up to 10/10 pain with activity   Goal status: INITIAL  ASSESSMENT:  CLINICAL IMPRESSION:  Pt. presents with 4-5/20 pain in the left shoulder. Pt. tolerated the exercises without  increased reports of pain. Pt. was able to demonstrate Home exercises with support, and assist required initially with that support tapering off. Options for modifications for support of the LUE were reviewed with the Pt. if needed during the exercises. Pt. presents with difficulty performing translatory movements of the hand moving the 1 sticks from the right hand resulting in several of the pegs dropping out of the palm of the hand, and fingers. Pt. needs additional focus on improving [facility] skills, and manipulating small objects in both the right, and left hands.  Pt. continues to benefit from skilled OT to address above noted deficits and work towards above noted goals in OT poc in order to maximize indep with daily tasks.   PERFORMANCE DEFICITS: in functional skills including ADLs, IADLs, coordination, dexterity, sensation, ROM, strength, pain, flexibility, Fine motor control, Gross motor control, mobility, balance, endurance, decreased knowledge of precautions, decreased knowledge of use of DME, skin integrity, and UE functional use, cognitive skills including emotional, and psychosocial  skills including coping strategies, environmental adaptation, habits, and routines and behaviors.   IMPAIRMENTS: are limiting patient from ADLs, IADLs, rest and sleep, work, leisure, and social participation.   CO-MORBIDITIES: has co-morbidities such as anxiety/depression, HTN, asthma, DM2, DDD (lumbar) that affects occupational performance. Patient will benefit from skilled OT to address above impairments and improve overall function.  MODIFICATION OR ASSISTANCE TO COMPLETE EVALUATION: Min-Moderate modification of tasks or assist with assess necessary to complete an evaluation.  OT OCCUPATIONAL PROFILE AND HISTORY: Detailed assessment: Review of records and additional review of physical, cognitive, psychosocial history related to current functional performance.  CLINICAL DECISION MAKING: Moderate - several treatment options, min-mod task modification necessary  REHAB POTENTIAL: Good  EVALUATION COMPLEXITY: Moderate    PLAN:  OT FREQUENCY: 2x/week  OT DURATION: 12 weeks  PLANNED INTERVENTIONS: 97168 OT Re-evaluation, 97535 self care/ADL training, 02889 therapeutic exercise, 97530 therapeutic activity, 97112 neuromuscular re-education, 97140 manual therapy, 97116 gait training, 02989 moist heat, 97010 cryotherapy, 97034 contrast bath, 97750 Physical Performance Testing, 02239 Orthotic Initial, 97763 Orthotic/Prosthetic subsequent, passive range of motion, balance training, functional mobility training, psychosocial skills training, energy conservation, coping strategies training, patient/family education, and DME and/or AE instructions  RECOMMENDED OTHER SERVICES: None at this time  CONSULTED AND AGREED WITH PLAN OF CARE: Patient and family member/caregiver  PLAN FOR NEXT SESSION: see above  Richardson Otter, MS, OTR/L  04/26/24, 4:55 PM

## 2024-04-26 NOTE — Therapy (Signed)
 OUTPATIENT PHYSICAL THERAPY NEURO TREATMENT   Patient Name: Eileen Chaney MRN: [id number] DOB:1976-07-25, 48 y.o., female Today's Date: 04/26/2024   PCP: Epifanio Alm SQUIBB REFERRING PROVIDER: Benjamen Sieving   END OF SESSION:  PT End of Session - 04/26/24 1341     Visit Number 5    Number of Visits 24    Date for PT Re-Evaluation 06/29/24    Progress Note Due on Visit 10    PT Start Time 1320    PT Stop Time 1347    PT Time Calculation (min) 27 min    Equipment Utilized During Treatment Gait belt    Activity Tolerance Patient tolerated treatment well    Behavior During Therapy WFL for tasks assessed/performed            Past Medical History:  Diagnosis Date   Anxiety    Arthritis    Asthma    Cervical myelopathy with cervical radiculopathy (HCC)    Complication of anesthesia    with c sections had a had time getting her sedated   DDD (degenerative disc disease), cervical    Depression    Diabetes mellitus without complication (HCC)    type 2   Family history of adverse reaction to anesthesia    mother hard to wake up   Fibromyalgia    Hyperlipidemia    Hypertension    Idiopathic urticaria    Pneumonia    PONV (postoperative nausea and vomiting)    Undifferentiated inflammatory arthritis    Past Surgical History:  Procedure Laterality Date   ABDOMINAL HYSTERECTOMY  2018   uterine fibroids   ANKLE SURGERY Right 2022   ligament repair   ANTERIOR CERVICAL DECOMP/DISCECTOMY FUSION N/A 11/12/2021   Procedure: C3-5 ANTERIOR CERVICAL DISCECTOMY AND FUSION (GLOBUS HEDRON);  Surgeon: Clois Fret, MD;  Location: [facility];  Service: Neurosurgery;  Laterality: N/A;   CERVICAL WOUND DEBRIDEMENT N/A 01/20/2022   Procedure: posterior wound debridment;  Surgeon: Clois Fret, MD;  Location: [facility];  Service: Neurosurgery;  Laterality: N/A;   CESAREAN SECTION     X3 2000, 2001, 2009   DILATION AND CURETTAGE OF UTERUS     POSTERIOR CERVICAL  FUSION/FORAMINOTOMY N/A 03/15/2024   Procedure: POSTERIOR CERVICAL FUSION/FORAMINOTOMY LEVEL 4;  Surgeon: Clois Fret, MD;  Location: [facility];  Service: Neurosurgery;  Laterality: N/A;  C4-7 Posterior Spinal Decompression C4-7 Posterior Spinal Instrumentation C5-7 Fusion   POSTERIOR CERVICAL LAMINECTOMY N/A 01/07/2022   Procedure: OPEN C5-7 POSTERIOR DECOMPRESSION;  Surgeon: Clois Fret, MD;  Location: [facility];  Service: Neurosurgery;  Laterality: N/A;   TONSILLECTOMY  2000   WISDOM TOOTH EXTRACTION     Patient Active Problem List   Diagnosis Date Noted   Anxiety with depression 03/24/2024   Cervical myelopathy (HCC) 03/15/2024   Spinal stenosis in cervical region 03/15/2024   Cervical radiculopathy 03/15/2024   S/P cervical spinal fusion 03/15/2024   Wound infection after surgery 01/20/2022   Gestational diabetes 06/02/2020   Arthritis 10/11/2019   DDD (degenerative disc disease), lumbar 10/11/2019   Hyperlipidemia 10/11/2019   Idiopathic urticaria 10/11/2019   Recurrent major depressive disorder, in partial remission (HCC) 10/11/2019   Spinal stenosis of lumbar region without neurogenic claudication 10/11/2019   Type 2 diabetes mellitus without complication, with long-term current use of insulin  (HCC) 10/11/2019   Elevated lactic acid level 09/29/2017   Leukocytosis 09/29/2017   Diabetes (HCC) 09/28/2017   Hypertension 09/28/2017   Mild asthma without complication 09/28/2017   Morbid obesity (HCC) 09/28/2017  Thalassemia 09/28/2017   GAD (generalized anxiety disorder) 07/25/2014   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, unspecified 03/14/2014    ONSET DATE: 03/15/24  REFERRING DIAG: cervical myelopathy, s/p cervical spinal fusion   THERAPY DIAG:  Cervical myelopathy (HCC)  Muscle weakness (generalized)  Other lack of coordination  Unsteadiness on feet  Difficulty in walking, not elsewhere classified  Cervicalgia  Rationale for  Evaluation and Treatment: Rehabilitation  SUBJECTIVE:                                                                                                                                                                                             SUBJECTIVE STATEMENT: Patient reports doing well overall- specifically with her balance - reporting she feels she is back at least 80% in terms of her balance. She reports ongoing left shoulder and bilateral shoulder blade pain- currently rates at a 3/10 but still up to 8-9/10 daily.   Patient presents to PT s/p cervical surgery and wearing a new sling today.   Pt accompanied by: family member  PERTINENT HISTORY: Patient underwent surgery on 03/15/24 for cervical myelopathy, cervical spinal stenosis, and cervical radiculopathy with the following procedure: Posterior Segmental Instrumentation C3-7, Posterolateral arthrodesis from C5-7, Cervical Laminectomy from C4-C6 for decompression of the spinal cord with bilateral foraminotomies and facetectomies at C3/4, C4/5, C5/6, and left foraminotomy at C6/7 . PMH significant for C3-5 ACDF 2023; posterior cervical laminectomy C5-7 2024. Patient went to inpatient rehab on 03/19/24 and discharged 04/01/24.  History significant for anxiety/depression as well as component of ADHD maintained on Ritalin , asthma, class I obesity with BMI 35.28 diabetes mellitus fibromyalgia hyperlipidemia hypertension posterior cervical laminectomy 01/07/2022, anterior cervical decompressive discectomy fusion 11/12/2021 complicated by cervical wound debridement 01/20/2022.   Reports it has been hard being home, its hard to move in her house, pain management has not been controlled. Sometimes wears soft collar.  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 Patient reports she is stumbling and walks off balance.   PAIN:  Are you having pain? Yes: NPRS scale: worst pain: 10/10, least 4/10 Pain location: L neck to shoulders and down to hands ;  pain and numbness is primarily L side  Pain description: nerve pain Aggravating factors: walking, being upright, move L arm Relieving factors: reclining,   PRECAUTIONS: Other: no > 10 lb   RED FLAGS: None   WEIGHT BEARING RESTRICTIONS: 10 lb   FALLS: Has patient fallen in last 6 months? No  LIVING ENVIRONMENT: Lives with: lives with their spouse Lives in: House/apartment Stairs: 2 level home, on first floor for now ; is  having a ramp put in this week, has to go up stairs to bathe.  Has following equipment at home: Walker - 4 wheeled  PLOF: Independent  PATIENT GOALS: become independent and pain free.   OBJECTIVE:  Note: Objective measures were completed at Evaluation unless otherwise noted.  DIAGNOSTIC FINDINGS: IMPRESSION: 1. Stable C3-4 and C4-5 ACDF, with no change in orthopedic hardware. 2. Recent postsurgical changes from decompressive laminectomy and posterior fusion spanning C3 through C7, with anatomic alignment. 3. Subcutaneous gas and fluid along the deep margin of the posterior midline incision, consistent with postoperative seroma or hematoma. Surgical drain is seen along the dorsal margin of the central canal. 4. No acute displaced fracture.  COGNITION: Overall cognitive status: Within functional limits for tasks assessed   SENSATION: Decreased sensation LUE and LLE   COORDINATION: Heel slide test: decreased coordination LLE  POSTURE: rounded shoulders and L shoulder elevation       Right Left  Flexion 20*  Extension 9*  Side Bending 3 8  Rotation 14 27    LOWER EXTREMITY MMT:    MMT Right Eval Left Eval  Hip flexion 5 4-  Hip extension    Hip abduction 5 4  Hip adduction 5 4  Hip internal rotation    Hip external rotation    Knee flexion 5 4  Knee extension 5 4  Ankle dorsiflexion 5 4  Ankle plantarflexion 5 4  Ankle inversion    Ankle eversion    (Blank rows = not tested)   TRANSFERS: Sit to stand: SBA  Assistive device  utilized: None     Stand to sit: SBA  Assistive device utilized: None     Chair to chair: SBA  Assistive device utilized: None        STAIRS: Assess next session  GAIT: Findings: Gait Characteristics: step through pattern, decreased arm swing- Left, decreased stance time- Left, decreased stride length, shuffling, and poor foot clearance- Left, Distance walked: 30 ft, Assistive device utilized:None, Level of assistance: SBA, and Comments: decreased coordination of LLE   FUNCTIONAL TESTS:  5 times sit to stand: perform next session.  Functional gait assessment:  Perform next session  Interpretation of scores: Non-Specific Older Adults Cutoff Score: <=22/30 = risk of falls Parkinson's Disease Cutoff score <15/30= fall risk (Hoehn & Yahr 1-4)  Minimally Clinically Important Difference (MCID)  Stroke (acute, subacute, and chronic) = MDC: 4.2 points Vestibular (acute) = MDC: 6 points Community Dwelling Older Adults =  MCID: 4 points Parkinson's Disease  =  MDC: 4.3 points  (Academy of Neurologic Physical Therapy (nd). Functional Gait Assessment. Retrieved from https://www.neuropt.org/docs/default-source/cpgs/core-outcome-measures/function-gait-assessment-pocket-guide-proof9-(2).pdf?sfvrsn=b68f35043_0.)  PATIENT SURVEYS:  ABC scale: The Activities-Specific Balance Confidence (ABC) Scale 0% 10 20 30  40 50 60 70 80 90 100% No confidence<->completely confident  "How confident are you that you will not lose your balance or become unsteady when you . . .   Date tested 04/06/24 04/26/24  Walk around the house 0% 80  2. Walk up or down stairs 10% 90  3. Bend over and pick up a slipper from in front of a closet floor 0% 80  4. Reach for a small can off a shelf at eye level 10% 100  5. Stand on tip toes and reach for something above your head 0% N/a  6. Stand on a chair and reach for something 0% 70  7. Sweep the floor 0%  N/a  8. Walk outside the house to a car parked in the driveway 10%  90  9.  Get into or out of a car 10% 90  10. Walk across a parking lot to the mall 0% 90  11. Walk up or down a ramp 0% 90  12. Walk in a crowded mall where people rapidly walk past you 0% 80  13. Are bumped into by people as you walk through the mall 0% 80  14. Step onto or off of an escalator while you are holding onto the railing 0% 70  15. Step onto or off an escalator while holding onto parcels such that you cannot hold onto the railing 0% 70  16. Walk outside on icy sidewalks 0% 70  Total: #/16 2.5 72.5%    NDI:  NECK DISABILITY INDEX   Date: Eval  Score SCORE 04/26/24  Pain intensity 2 = The pain is moderate at the moment 2  2. Personal care (washing, dressing, etc.) 4 =  I need help every day in most aspects of self care 2  3. Lifting 5 = I cannot lift or carry anything  1  4. Reading 4 =  I can hardly read at all because of severe pain in my neck 3  5. Headaches 0 = I have no headaches at all 0  6. Concentration 2 = I have a fair degree of difficulty in concentrating when I want to 1  7. Work 0 =  I can do as much work as I want to 3  8. Driving 5 = I can't drive my car at all 3  9. Sleeping 4 = My sleep is greatly disturbed (3-5 hrs sleepless)  2  10. Recreation 5 = I can't do any recreation activities at all 4  Total 31/50 21/50; 42%   Minimum Detectable Change (90% confidence): 5 points or 10% points                                                                                                                              TREATMENT DATE: 04/26/24    *Place gait belt around patient to use as a sling today to support LUE.     Physical therapy treatment session today consisted of completing assessment of goals and administration of testing as demonstrated and documented in flow sheet, treatment, and goals section of this note. Addition treatments may be found below.      [facility] PT Assessment - 04/26/24 1341       Functional Gait  Assessment   Gait assessed  Yes    Gait Level  Surface Walks 20 ft in less than 5.5 sec, no assistive devices, good speed, no evidence for imbalance, normal gait pattern, deviates no more than 6 in outside of the 12 in walkway width.    Change in Gait Speed Able to smoothly change walking speed without loss of balance or gait deviation. Deviate no more than 6 in outside of the 12 in walkway width.    Gait with Horizontal  Head Turns Performs head turns smoothly with no change in gait. Deviates no more than 6 in outside 12 in walkway width    Gait with Vertical Head Turns Performs head turns with no change in gait. Deviates no more than 6 in outside 12 in walkway width.    Gait and Pivot Turn Pivot turns safely within 3 sec and stops quickly with no loss of balance.    Step Over Obstacle Is able to step over 2 stacked shoe boxes taped together (9 in total height) without changing gait speed. No evidence of imbalance.    Gait with Narrow Base of Support Is able to ambulate for 10 steps heel to toe with no staggering.    Gait with Eyes Closed Walks 20 ft, no assistive devices, good speed, no evidence of imbalance, normal gait pattern, deviates no more than 6 in outside 12 in walkway width. Ambulates 20 ft in less than 7 sec.    Ambulating Backwards Walks 20 ft, no assistive devices, good speed, no evidence for imbalance, normal gait    Steps Alternating feet, no rail.    Total Score 30          See above section for reassessment for NDI, ABD surveys   Self care: Reviewed HEP and use of sling as needed.     PATIENT EDUCATION: Education details: goals, POC, HEP  Person educated: Patient Education method: Explanation, Demonstration, Tactile cues, and Verbal cues Education comprehension: verbalized understanding, returned demonstration, verbal cues required, tactile cues required, and needs further education  HOME EXERCISE PROGRAM:  Access Code: [email or web address] URL: [email or web address] Date: 04/13/2024 Prepared by: Reyes London  Exercises - Tandem Walking with Counter Support  - 3 x weekly - 3-5 sets - 5 length of counter and back - Seated Cervical Retraction  - 3 x weekly - 3 sets - 10 reps - Standing Shoulder Extension with Resistance  - 3 x weekly - 3 sets - 10 reps - Seated Shoulder Row with Anchored Resistance  - 3 x weekly - 3 sets - 10 reps    Access Code: [email or web address] URL: [email or web address] Date: 04/08/2024 Prepared by: Lonni Gainer Exercises - Seated Scapular Retraction  - 1-2 x daily - 7 x weekly - 3 sets - 30 reps - Supine Cervical Rotation AROM on Pillow  - 1 x daily - 7 x weekly - 3 sets - 20 reps - Supine Cervical Sidebending  - 1 x daily - 7 x weekly - 3 sets - 20 reps   GOALS: Goals reviewed with patient? Yes  SHORT TERM GOALS: Target date: 05/04/2024    Patient will be independent in home exercise program to improve strength/mobility for better functional independence with ADLs. Baseline: give next session; 04/26/2024= Patient reports compliance with HEP and no questions or concerns.  Goal status: MET    LONG TERM GOALS: Target date: 06/29/2024   Patient will report a worst pain of 3/10 on VAS in cervical spine to improve tolerance with ADLs and reduced symptoms with activities.  Baseline: 7/8: 10/10; 04/26/2024= Current - No pain in cervical spine but does endorse ongoing bilateral shoulder blade area pain 3/10; Worst 8-9/10 -still daily  Goal status: MET  2.  Patient will increase Functional Gait Assessment score to >20/30 as to reduce fall risk and improve dynamic gait safety with community ambulation Baseline: perform next session; 7/10= 25/30; 04/26/2024= 30/30 Goal status: MET  3.  Patient will reduce Neck Disability Index score to <10% to demonstrate minimal disability  with ADL's including improved sleeping tolerance, sitting tolerance, etc for better mobility at home and work. Baseline: 31/50; 21/50 or 42%  Goal status: MET  4.  Patient will increase  ABC scale score >80% to demonstrate better functional mobility and better confidence with ADLs.  Baseline: 2.5%; 04/26/2024= 72.5% Goal status: Much improved but just short of goal.    ASSESSMENT:  CLINICAL IMPRESSION: Goals reassessed early since patient reports improving overall balance and OT working on R shoulder/arm and no symptoms in cervical region- able exhibit good Range of motion. She demonstrated perfect score today on FGA and at this time denies any cervical pain but does endorse lack of functional ruse of R shoulder/arm.  At this time- goals are mostly met except ABC- although did improve from 2.5 up to 72.5%. She plans to continue with OT for R shoulder but has no further PT needs at this time. She is agreeable for discharge today with plan to continue with OT services.     OBJECTIVE IMPAIRMENTS: Abnormal gait, decreased activity tolerance, decreased balance, decreased coordination, decreased mobility, difficulty walking, decreased ROM, decreased strength, hypomobility, impaired perceived functional ability, impaired flexibility, impaired sensation, impaired UE functional use, improper body mechanics, postural dysfunction, and pain.   ACTIVITY LIMITATIONS: carrying, lifting, bending, sitting, standing, squatting, sleeping, stairs, transfers, bed mobility, bathing, toileting, dressing, reach over head, hygiene/grooming, locomotion level, and caring for others  PARTICIPATION LIMITATIONS: meal prep, cleaning, laundry, personal finances, interpersonal relationship, driving, shopping, community activity, occupation, and school  PERSONAL FACTORS: Age, Past/current experiences, Time since onset of injury/illness/exacerbation, and 3+ comorbidities: HTN, diabetes, cervical myelopathy and radiculopathy, DDD, GAD, HLD, depression, anxiety are also affecting patient's functional outcome.   REHAB POTENTIAL: Good  CLINICAL DECISION MAKING: Evolving/moderate complexity  EVALUATION COMPLEXITY:  Moderate  PLAN:  PT FREQUENCY: 2x/week  PT DURATION: 12 weeks  PLANNED INTERVENTIONS: 97164- PT Re-evaluation, 97750- Physical Performance Testing, 97110-Therapeutic exercises, 97530- Therapeutic activity, [id number]- Neuromuscular re-education, 97535- Self Care, [id number]- Manual therapy, [id number]- Gait training, [phone]- Canalith repositioning, [id number]- Electrical stimulation (unattended), [phone]- Electrical stimulation (manual), [id number]- Ultrasound, [id number] (1-2 muscles), [id number] (3+ muscles)- Dry Needling, Patient/Family education, Balance training, Stair training, Taping, Joint mobilization, Spinal mobilization, Scar mobilization, Vestibular training, Visual/preceptual remediation/compensation, DME instructions, Cryotherapy, Moist heat, and Biofeedback  PLAN FOR NEXT SESSION:  -Discharge from PT at this time.  Reyes LOISE London, PT 04/26/2024, 2:20 PM

## 2024-04-27 ENCOUNTER — Encounter: Admitting: Neurosurgery

## 2024-04-29 ENCOUNTER — Ambulatory Visit

## 2024-04-29 ENCOUNTER — Encounter: Payer: Self-pay | Admitting: Neurosurgery

## 2024-04-29 ENCOUNTER — Ambulatory Visit: Admitting: Physical Therapy

## 2024-04-29 ENCOUNTER — Ambulatory Visit
Admission: RE | Admit: 2024-04-29 | Discharge: 2024-04-29 | Disposition: A | Discharge status: Home / Self Care | Source: Home / Self Care | Attending: Neurosurgery | Admitting: Neurosurgery

## 2024-04-29 ENCOUNTER — Ambulatory Visit
Admission: RE | Admit: 2024-04-29 | Discharge: 2024-04-29 | Disposition: A | Discharge status: Home / Self Care | Source: Ambulatory Visit | Attending: Neurosurgery | Admitting: Neurosurgery

## 2024-04-29 ENCOUNTER — Ambulatory Visit (INDEPENDENT_AMBULATORY_CARE_PROVIDER_SITE_OTHER): Admitting: Neurosurgery

## 2024-04-29 VITALS — BP 122/80 | Ht 68.0 in | Wt 235.0 lb

## 2024-04-29 DIAGNOSIS — M542 Cervicalgia: Secondary | ICD-10-CM | POA: Diagnosis not present

## 2024-04-29 DIAGNOSIS — M4802 Spinal stenosis, cervical region: Secondary | ICD-10-CM

## 2024-04-29 DIAGNOSIS — G959 Disease of spinal cord, unspecified: Secondary | ICD-10-CM

## 2024-04-29 DIAGNOSIS — M6281 Muscle weakness (generalized): Secondary | ICD-10-CM

## 2024-04-29 DIAGNOSIS — R29898 Other symptoms and signs involving the musculoskeletal system: Secondary | ICD-10-CM

## 2024-04-29 DIAGNOSIS — Z981 Arthrodesis status: Secondary | ICD-10-CM

## 2024-04-29 DIAGNOSIS — R278 Other lack of coordination: Secondary | ICD-10-CM

## 2024-04-29 MED ORDER — METHOCARBAMOL 500 MG PO TABS: 1000.0000 mg | ORAL_TABLET | Freq: Four times a day (QID) | ORAL | 0 refills | Status: DC

## 2024-04-29 NOTE — Therapy (Signed)
 OUTPATIENT OCCUPATIONAL THERAPY NEURO TREATMENT NOTE  Patient Name: Eileen Chaney MRN: [id number] DOB:03/18/1976, 48 y.o., female Today's Date: 04/29/2024  PCP: Dr. Alm Needle REFERRING PROVIDER: Toribio Pitch, PA-C  END OF SESSION:  OT End of Session - 04/29/24 1902     Visit Number 8    Number of Visits 24    Date for OT Re-Evaluation 06/29/24    OT Start Time 0813    OT Stop Time 0851    OT Time Calculation (min) 38 min    Activity Tolerance Patient tolerated treatment well    Behavior During Therapy WFL for tasks assessed/performed         Past Medical History:  Diagnosis Date   Anxiety    Arthritis    Asthma    Cervical myelopathy with cervical radiculopathy (HCC)    Complication of anesthesia    with c sections had a had time getting her sedated   DDD (degenerative disc disease), cervical    Depression    Diabetes mellitus without complication (HCC)    type 2   Family history of adverse reaction to anesthesia    mother hard to wake up   Fibromyalgia    Hyperlipidemia    Hypertension    Idiopathic urticaria    Pneumonia    PONV (postoperative nausea and vomiting)    Undifferentiated inflammatory arthritis    Past Surgical History:  Procedure Laterality Date   ABDOMINAL HYSTERECTOMY  2018   uterine fibroids   ANKLE SURGERY Right 2022   ligament repair   ANTERIOR CERVICAL DECOMP/DISCECTOMY FUSION N/A 11/12/2021   Procedure: C3-5 ANTERIOR CERVICAL DISCECTOMY AND FUSION (GLOBUS HEDRON);  Surgeon: Clois Fret, MD;  Location: [facility];  Service: Neurosurgery;  Laterality: N/A;   CERVICAL WOUND DEBRIDEMENT N/A 01/20/2022   Procedure: posterior wound debridment;  Surgeon: Clois Fret, MD;  Location: [facility];  Service: Neurosurgery;  Laterality: N/A;   CESAREAN SECTION     X3 2000, 2001, 2009   DILATION AND CURETTAGE OF UTERUS     POSTERIOR CERVICAL FUSION/FORAMINOTOMY N/A 03/15/2024   Procedure: POSTERIOR CERVICAL FUSION/FORAMINOTOMY  LEVEL 4;  Surgeon: Clois Fret, MD;  Location: [facility];  Service: Neurosurgery;  Laterality: N/A;  C4-7 Posterior Spinal Decompression C4-7 Posterior Spinal Instrumentation C5-7 Fusion   POSTERIOR CERVICAL LAMINECTOMY N/A 01/07/2022   Procedure: OPEN C5-7 POSTERIOR DECOMPRESSION;  Surgeon: Clois Fret, MD;  Location: [facility];  Service: Neurosurgery;  Laterality: N/A;   TONSILLECTOMY  2000   WISDOM TOOTH EXTRACTION     Patient Active Problem List   Diagnosis Date Noted   Anxiety with depression 03/24/2024   Cervical myelopathy (HCC) 03/15/2024   Spinal stenosis in cervical region 03/15/2024   Cervical radiculopathy 03/15/2024   S/P cervical spinal fusion 03/15/2024   Wound infection after surgery 01/20/2022   Gestational diabetes 06/02/2020   Arthritis 10/11/2019   DDD (degenerative disc disease), lumbar 10/11/2019   Hyperlipidemia 10/11/2019   Idiopathic urticaria 10/11/2019   Recurrent major depressive disorder, in partial remission (HCC) 10/11/2019   Spinal stenosis of lumbar region without neurogenic claudication 10/11/2019   Type 2 diabetes mellitus without complication, with long-term current use of insulin  (HCC) 10/11/2019   Elevated lactic acid level 09/29/2017   Leukocytosis 09/29/2017   Diabetes (HCC) 09/28/2017   Hypertension 09/28/2017   Mild asthma without complication 09/28/2017   Morbid obesity (HCC) 09/28/2017   Thalassemia 09/28/2017   GAD (generalized anxiety disorder) 07/25/2014   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, unspecified  03/14/2014   ONSET DATE: 03/15/24  REFERRING DIAG:  G95.9 (ICD-10-CM) - Cervical myelopathy (HCC)  Z98.1 (ICD-10-CM) - S/P cervical spinal fusion   THERAPY DIAG:  Muscle weakness (generalized)  Other lack of coordination  Cervical myelopathy (HCC)  Rationale for Evaluation and Treatment: Rehabilitation  SUBJECTIVE:  SUBJECTIVE STATEMENT: Pt  reports planning to return to work on Aug. 19th in a  modified capacity. Pt accompanied by: Son  PERTINENT HISTORY: Per chart: CIR d/c summary: Eileen Chaney is a 48 y.o. right-handed female with history significant for anxiety/depression as well as component of ADHD maintained on Ritalin , asthma, class I obesity with BMI 35.28 diabetes mellitus fibromyalgia hyperlipidemia hypertension posterior cervical laminectomy 01/07/2022, anterior cervical decompressive discectomy fusion 11/12/2021 complicated by cervical wound debridement 01/20/2022.  Per chart review patient lives with daughter.  She has good support from her family.  Two-level home bed and bath on main level independent community ambulator.  Presented to [facility]/15/2024 with increasing difficulty with dexterity in her hands as well as poor balance.  She was having trouble using her phone as well as difficulty with buttons and clasping her necklaces.  She continued to have ongoing neck and left arm and shoulder pain with numbness as well as low back pain that extends into her buttocks.  She had denied any recent falls.  Recent imaging showed severe central canal stenosis at C4-5 C5-6 with foraminal stenosis from C3-7.  Most recent preoperative labs showed a hemoglobin of 12.8 hematocrit 40.3 on 03/03/2024 and unremarkable chemistries.  Underwent posterior segmental instrumentation of C3-7 as well as posterior lateral arthrodesis from C5-7 with cervical laminectomy from C4-C6 for decompression of the spinal cord with bilateral foraminotomies and facetectomies at C3/4 C4-5 C5-6 and left foraminotomies C6-7 03/15/2024 per Dr. Reeves Nine.  Laced in a cervical brace for comfort.  She did complete a course of Decadron  taper.  Patient control with pain with the use of Dilaudid  as well as Valium .  Lovenox  added for DVT prophylaxis 03/16/2024.  Follow-up imaging CT/MRI cervical spine 03/16/2024 due to complaints of increasing neck pain and acute left arm weakness showed noted degenerative changes moderate to  severe spinal canal stenosis C3-4 with mild cord compression without definitive cord signal abnormality.  Multilevel foraminal stenosis greatest and severe bilaterally at C3-4 C5-6 on the left at C6-7.  Moderate to severe spinal canal stenosis on the right at C6-7.  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  Therapy evaluations completed due to patient decreased functional mobility was admitted for a comprehensive rehab program.   PRECAUTIONS: Cervical, wears soft cervical collar prn   WEIGHT BEARING RESTRICTIONS: Yes ; no lifting >10 lbs   PAIN: 7/18/225: 4-5/10 left shoulder following PT.  04/20/24: 2/10 L shoulder, 7-8/10 pain at times with activity 04/15/24: 2/10 pain  in the LUE in supine described as sharp nerve pain. 04/13/24: 3/10 pain in the left shoulder during standing, and pain in supine at approximately 45 degrees of ROM 04/08/24: 3/10 at rest, 8-10 with activity Are you having pain? Yes: NPRS scale: 4/10 pain at rest, 8/10 with activity Pain location: neck, shoulders down to hands Pain description: nerve pain Aggravating factors: walking, sitting, standing, moving L arm  Relieving factors: reclining  FALLS: Has patient fallen in last 6 months? 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   OBJECTIVE:  Note:  Objective measures were completed at Evaluation unless otherwise noted.  HAND DOMINANCE: Right  ADLs: Overall ADLs: Caregiver assist for ADLs Transfers/ambulation related to ADLs: using rollator at home for balance and to take pressure off shoulders and back Eating: difficulty cutting food Grooming: Max A with hair care d/t BUE weakness UB Dressing: Limited engagement of LUE LB Dressing: Difficulty hiking pants d/t limited engagement of the LUE Toileting: modified indep using R dominant/less affected arm Bathing: supv for tub/shower transfers; assist to wash upper R side of body; reports her long handled sponge is ineffective Tub Shower transfers: transfer tub bench in place but able to step over shower with supv and sits on bench prn throughout shower Equipment: Transfer tub bench  IADLs: Shopping: dep Light housekeeping: Max A; pt reports she can load washing machine but is unable to load/unload dishwasher d/t the bending required Meal Prep: Partner managing meals Community mobility: increased pain/limited Medication management: indep Financial management: indep  POSTURE COMMENTS:  rounded shoulders and forward head  ACTIVITY TOLERANCE: Activity tolerance: Limited tolerance for sitting and standing; more comfortable in supine (performed eval partially supine on mat to manage pain)  UPPER EXTREMITY ROM:    Active ROM Right eval Left eval  Shoulder flexion 55 30  Shoulder abduction 55 20  Shoulder adduction    Shoulder extension    Shoulder internal rotation WNL Unable to attempt behind back  Shoulder external rotation -20 Unable to attempt behind head  Elbow flexion    Elbow extension    Wrist flexion    Wrist extension    Wrist ulnar deviation    Wrist radial deviation    Wrist pronation    Wrist supination    (Blank rows = not tested)  UPPER EXTREMITY MMT:     MMT Right eval Left eval  Shoulder flexion 2+ 2-  Shoulder abduction 2+ 2-  Shoulder adduction     Shoulder extension    Shoulder internal rotation 4 2+  Shoulder external rotation 3- 2  Middle trapezius    Lower trapezius    Elbow flexion 4+ 1 to 2- (will assess more thoroughly next session)  Elbow extension 5 4  Wrist flexion 5 4  Wrist extension 5 4  Wrist ulnar deviation    Wrist radial deviation    Wrist pronation 5 3+  Wrist supination 5 3-  (Blank rows = not tested)  HAND FUNCTION: Grip strength: Right: 68 lbs; Left: 34 lbs, Lateral pinch: Right: 13 lbs, Left: 7 lbs, and 3 point pinch: Right: 8 lbs, Left: 5 lbs  COORDINATION: 9 Hole Peg test: Right: 24 sec; Left: 47 sec  SENSATION: tingling/numbness in L hand digits 1-3, diminished up the L arm, no tingling in the RUE  COGNITION: Overall cognitive status: Within functional limits for tasks assessed  VISION: WNL  OBSERVATIONS:  Pt visibly in pain in with facial grimacing during shoulder shrugs  NEXT MD APPT: Return to neuro surgeon July 29.  TREATMENT DATE: 04/29/24   Therapeutic Exercise: In supine: -L shoulder protraction with arm in 90* shoulder flexion, 2 set x 10 reps each  -AAROM for L shoulder all planes, limited to ~90* within cervical precaution restrictions. Place and hold of L arm in  90 degrees of flexion, cues for arm positioning and hold pattern 2 sets 5 reps Focused on slow descent of arm from flexion with therapist guiding as needed.  2 sets 5 reps 90 degrees of shoulder flexion with small clockwise and counterclockwise circles with therapist guiding as needed and guarding to protect arm position, 2 sets 5 reps 90 degrees of shoulder flexion with elbow flexion to top of head with triceps press to return to original position 1 x 10 reps with increased effort 90 degrees of shoulder flexion with elbow flexion to opposite shoulder with triceps press. 2 X 5 reps  Dowel exercises in  supine for chest press, ABD/ADD and mini circles with 1# dowel. Cues provided for exercises for proper form and technique, guarding as needed and guiding for movement patterns.  Seated:  -AAROM seated for left shoulder flexion,diagonal abduction, and external rotation using the UE Ranger on a level plane with support to the left elbow. UE Ranger position was modified for AAROM shoulder flexion progression against gravity.     PATIENT EDUCATION: Education details:  Person educated: Patient and Child psychotherapist: Explanation, Demonstration, and Verbal cues Education comprehension: verbalized understanding, verbal cues required, and needs further education  HOME EXERCISE PROGRAM: 04/26/24:  HEP through [facility]: -AAROM at the tabletop for internal, and external rotation. -Shoulder flexion/extension AAROM with dowel/cane base blocked at the wall -Shoulder circles AAROM with the dowel/cane into wall- with base blacked at the wall -Elbow flexion, extension extension AAROM with dowel/cane into wall with base of dowel/cane supported at the wall.  04/20/24:-LUE AAROM/AROM to tolerance and within cervical precautions; shoulder shrugs and retraction in front of mirror, theraputty 04/15/24: -positioning for comfort 04/13/24: -Bilateral AAROM Shoulder flexion and abduction tabletop exercises while seated. -AROM, and resistive forearm supination sitting with the LUE supported on pillows. -Supine left elbow flexion in gravity eliminated plane, progressing to partial gravity with resistance. 04/08/24: Refer to treatment note for HEP details.  GOALS: Goals reviewed with patient? Yes  SHORT TERM GOALS: Target date: 05/18/24  Pt will be indep to perform HEP for improving BUE strength/flexibility/coordination for daily tasks. Baseline: Eval: Not yet initiated Goal status: INITIAL  LONG TERM GOALS: Target date: 06/29/24  Pt will increase L grip strength by 20 lbs or more in order to  improve ability to grasp and carry heavier ADL supplies.  Baseline: Eval: L grip 34 lbs (R 68 lbs) Goal status: INITIAL  2.  Pt will increase L lateral pinch strength by 3 or more lbs to maintain grasp of fork for independence with cutting food. Baseline: Eval: L 7 lbs (R 13 lbs) Goal status: INITIAL  3.  Pt will increase R shoulder and LUE strength by 1 MM grade or more to better engage BUEs into ADLs.  Baseline: Eval: See above MMT; pt must use R hand to lift LUE up from lap to table top; L shoulder mobility BFL, R limited below 90*.  Pt predominately performing ADLs with R dominant arm. Goal status: INITIAL  4.  Pt will increase L hand FMC skills to manage clothing fasteners independently and manipulate small ADL supplies with reduced dropping.  Baseline: Eval: L 9 hole peg test 47 sec (R 24 sec)  5.  Pt will tolerate manual therapy, therapeutic modalities, and exercises to decrease pain in bilat shoulders/LUE to a reported 5/10 pain or less with activity.  Baseline: Eval: up to 10/10 pain with activity   Goal status: INITIAL  ASSESSMENT:  CLINICAL IMPRESSION:  Pt progressing well this date, no pain at rest, increased effort for movement patterns and some mild pain 3/10 with movement after multiple exercises and repetitions.  Pt progressing well to performing place and hold exercises of LUE in supine with arm in 90 degrees of shoulder flexion.  She is able to demonstrate improved controlled movement patterns for descent of arm from flexion.  Performance improved with exercise sets and repetitions with quality of movement patterns however, pt benefits from less reps and more sets when focusing on exercises.  Advised patient to have family member help guide her arm as needed but with very light touch to allow patient to activate and engage muscles in task.  Will continue to work towards ROM, strength and control of LUE to facilitate use in daily functional tasks.   Pt. continues to benefit  from skilled OT to address above noted deficits and work towards above noted goals in OT poc in order to maximize indep with daily tasks.   PERFORMANCE DEFICITS: in functional skills including ADLs, IADLs, coordination, dexterity, sensation, ROM, strength, pain, flexibility, Fine motor control, Gross motor control, mobility, balance, endurance, decreased knowledge of precautions, decreased knowledge of use of DME, skin integrity, and UE functional use, cognitive skills including emotional, and psychosocial skills including coping strategies, environmental adaptation, habits, and routines and behaviors.   IMPAIRMENTS: are limiting patient from ADLs, IADLs, rest and sleep, work, leisure, and social participation.   CO-MORBIDITIES: has co-morbidities such as anxiety/depression, HTN, asthma, DM2, DDD (lumbar) that affects occupational performance. Patient will benefit from skilled OT to address above impairments and improve overall function.  MODIFICATION OR ASSISTANCE TO COMPLETE EVALUATION: Min-Moderate modification of tasks or assist with assess necessary to complete an evaluation.  OT OCCUPATIONAL PROFILE AND HISTORY: Detailed assessment: Review of records and additional review of physical, cognitive, psychosocial history related to current functional performance.  CLINICAL DECISION MAKING: Moderate - several treatment options, min-mod task modification necessary  REHAB POTENTIAL: Good  EVALUATION COMPLEXITY: Moderate   PLAN:  OT FREQUENCY: 2x/week  OT DURATION: 12 weeks  PLANNED INTERVENTIONS: 97168 OT Re-evaluation, 97535 self care/ADL training, 02889 therapeutic exercise, 97530 therapeutic activity, 97112 neuromuscular re-education, 97140 manual therapy, 97116 gait training, 02989 moist heat, 97010 cryotherapy, 97034 contrast bath, 97750 Physical Performance Testing, 02239 Orthotic Initial, 97763 Orthotic/Prosthetic subsequent, passive range of motion, balance training, functional  mobility training, psychosocial skills training, energy conservation, coping strategies training, patient/family education, and DME and/or AE instructions  RECOMMENDED OTHER SERVICES: None at this time  CONSULTED AND AGREED WITH PLAN OF CARE: Patient and family member/caregiver  PLAN FOR NEXT SESSION: see above  Geniene List T Carolle Ishii, OTR/L, CLT 04/29/2024, 7:15 PM

## 2024-04-29 NOTE — Progress Notes (Signed)
   REFERRING PHYSICIAN:  Epifanio Alm SQUIBB, Md [address] [place],  [place] [address]  DOS: 03/15/24  C3-7 PSF, C4-6 decompression   HISTORY OF PRESENT ILLNESS: Eileen Chaney is 6 weeks status post cervical decompression and fusion. Her post-op course was complicated by new LUE weakness. 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   PHYSICAL EXAMINATION:  NEUROLOGICAL:  General: In no acute distress.   Awake, alert, oriented to person, place, and time.  Pupils equal round and reactive to light.  Facial tone is symmetric.  Tongue protrusion is midline.  There is no pronator drift.  Strength:  Side Biceps Triceps Deltoid Interossei Grip Wrist Ext. Wrist Flex.  R 4+ 4+ 4+ 4+ 5 5 5   L 3 4- 3 4- 4- 4 4   Incision c/d/I  Imaging:  04/29/24 cervical xrays Without evidence of hardware malfunction   Assessment / Plan: Eileen Chaney is improving after recent posterior cervical decompression and fusion. She continues to have some weakness in her LUE but has improved significantly. Her pain has also improved. We discussed starting Celebrex . Given her current use of Methotrexate, I have sent a message to Dr. Tobie, her rheumatologist to get his opinion and will send a prescription if he thinks this is a reasonable addition. I have also refilled her Robaxin . We discussed RTW. She would like to reach out to her HR to discuss if intermittent FMLA is available. We will provide her with the appropriate paperwork if needed. I would also advocate for a work from home option if this is available for her. She is scheduled for EMG next week and we will follow up on that once completed  We discussed activity escalation and I have advised the patient to lift up to 10 pounds until 6 weeks after surgery, then increase up to 25 pounds until 12 weeks after surgery.  After 12 weeks post-op, the patient advised to increase activity  as tolerated. she will return to clinic in approximately 6 weeks with cervical xrays prior or sooner should she have any questions or concerns.   Advised to contact the office if any questions or concerns arise.   Edsel Goods PA-C Dept of Neurosurgery

## 2024-05-04 ENCOUNTER — Ambulatory Visit

## 2024-05-04 ENCOUNTER — Ambulatory Visit: Admitting: Occupational Therapy

## 2024-05-06 ENCOUNTER — Ambulatory Visit

## 2024-05-07 ENCOUNTER — Telehealth: Payer: Self-pay

## 2024-05-07 ENCOUNTER — Ambulatory Visit: Attending: Physician Assistant

## 2024-05-07 ENCOUNTER — Encounter: Payer: Self-pay | Admitting: Neurosurgery

## 2024-05-07 DIAGNOSIS — R278 Other lack of coordination: Secondary | ICD-10-CM | POA: Insufficient documentation

## 2024-05-07 DIAGNOSIS — M6281 Muscle weakness (generalized): Secondary | ICD-10-CM | POA: Insufficient documentation

## 2024-05-07 DIAGNOSIS — G959 Disease of spinal cord, unspecified: Secondary | ICD-10-CM | POA: Insufficient documentation

## 2024-05-07 NOTE — Telephone Encounter (Signed)
 Left message to notify pt of her missed OT visit and reminded pt to call clinic when unable to attend. Provided date/time of upcoming visit .   Inocente Blazing, MS, OTR/L

## 2024-05-11 ENCOUNTER — Ambulatory Visit

## 2024-05-11 DIAGNOSIS — M6281 Muscle weakness (generalized): Secondary | ICD-10-CM

## 2024-05-11 DIAGNOSIS — G959 Disease of spinal cord, unspecified: Secondary | ICD-10-CM

## 2024-05-11 DIAGNOSIS — R278 Other lack of coordination: Secondary | ICD-10-CM

## 2024-05-11 NOTE — Therapy (Signed)
 OUTPATIENT OCCUPATIONAL THERAPY NEURO TREATMENT NOTE  Patient Name: Eileen Chaney MRN: [id number] DOB:01-Dec-1975, 48 y.o., female Today's Date: 05/12/2024  PCP: Dr. Alm Needle REFERRING PROVIDER: Toribio Pitch, PA-C  END OF SESSION:  OT End of Session - 05/12/24 2140     Visit Number 9    Number of Visits 24    Date for OT Re-Evaluation 06/29/24    OT Start Time 1450    OT Stop Time 1530    OT Time Calculation (min) 40 min    Activity Tolerance Patient tolerated treatment well    Behavior During Therapy WFL for tasks assessed/performed         Past Medical History:  Diagnosis Date   Anxiety    Arthritis    Asthma    Cervical myelopathy with cervical radiculopathy (HCC)    Complication of anesthesia    with c sections had a had time getting her sedated   DDD (degenerative disc disease), cervical    Depression    Diabetes mellitus without complication (HCC)    type 2   Family history of adverse reaction to anesthesia    mother hard to wake up   Fibromyalgia    Hyperlipidemia    Hypertension    Idiopathic urticaria    Pneumonia    PONV (postoperative nausea and vomiting)    Undifferentiated inflammatory arthritis    Past Surgical History:  Procedure Laterality Date   ABDOMINAL HYSTERECTOMY  2018   uterine fibroids   ANKLE SURGERY Right 2022   ligament repair   ANTERIOR CERVICAL DECOMP/DISCECTOMY FUSION N/A 11/12/2021   Procedure: C3-5 ANTERIOR CERVICAL DISCECTOMY AND FUSION (GLOBUS HEDRON);  Surgeon: Clois Fret, MD;  Location: [facility];  Service: Neurosurgery;  Laterality: N/A;   CERVICAL WOUND DEBRIDEMENT N/A 01/20/2022   Procedure: posterior wound debridment;  Surgeon: Clois Fret, MD;  Location: [facility];  Service: Neurosurgery;  Laterality: N/A;   CESAREAN SECTION     X3 2000, 2001, 2009   DILATION AND CURETTAGE OF UTERUS     POSTERIOR CERVICAL FUSION/FORAMINOTOMY N/A 03/15/2024   Procedure: POSTERIOR CERVICAL FUSION/FORAMINOTOMY  LEVEL 4;  Surgeon: Clois Fret, MD;  Location: [facility];  Service: Neurosurgery;  Laterality: N/A;  C4-7 Posterior Spinal Decompression C4-7 Posterior Spinal Instrumentation C5-7 Fusion   POSTERIOR CERVICAL LAMINECTOMY N/A 01/07/2022   Procedure: OPEN C5-7 POSTERIOR DECOMPRESSION;  Surgeon: Clois Fret, MD;  Location: [facility];  Service: Neurosurgery;  Laterality: N/A;   TONSILLECTOMY  2000   WISDOM TOOTH EXTRACTION     Patient Active Problem List   Diagnosis Date Noted   Anxiety with depression 03/24/2024   Cervical myelopathy (HCC) 03/15/2024   Spinal stenosis in cervical region 03/15/2024   Cervical radiculopathy 03/15/2024   S/P cervical spinal fusion 03/15/2024   Wound infection after surgery 01/20/2022   Gestational diabetes 06/02/2020   Arthritis 10/11/2019   DDD (degenerative disc disease), lumbar 10/11/2019   Hyperlipidemia 10/11/2019   Idiopathic urticaria 10/11/2019   Recurrent major depressive disorder, in partial remission (HCC) 10/11/2019   Spinal stenosis of lumbar region without neurogenic claudication 10/11/2019   Type 2 diabetes mellitus without complication, with long-term current use of insulin  (HCC) 10/11/2019   Elevated lactic acid level 09/29/2017   Leukocytosis 09/29/2017   Diabetes (HCC) 09/28/2017   Hypertension 09/28/2017   Mild asthma without complication 09/28/2017   Morbid obesity (HCC) 09/28/2017   Thalassemia 09/28/2017   GAD (generalized anxiety disorder) 07/25/2014   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, unspecified  03/14/2014   ONSET DATE: 03/15/24  REFERRING DIAG:  G95.9 (ICD-10-CM) - Cervical myelopathy (HCC)  Z98.1 (ICD-10-CM) - S/P cervical spinal fusion   THERAPY DIAG:  Muscle weakness (generalized)  Other lack of coordination  Cervical myelopathy (HCC)  Rationale for Evaluation and Treatment: Rehabilitation  SUBJECTIVE:  SUBJECTIVE STATEMENT: Pt reports inability to attend OT last week d/t  illness. Pt accompanied by: Son  PERTINENT HISTORY: Per chart: CIR d/c summary: Eileen Chaney is a 48 y.o. right-handed female with history significant for anxiety/depression as well as component of ADHD maintained on Ritalin , asthma, class I obesity with BMI 35.28 diabetes mellitus fibromyalgia hyperlipidemia hypertension posterior cervical laminectomy 01/07/2022, anterior cervical decompressive discectomy fusion 11/12/2021 complicated by cervical wound debridement 01/20/2022.  Per chart review patient lives with daughter.  She has good support from her family.  Two-level home bed and bath on main level independent community ambulator.  Presented to [facility] 03/15/2024 with increasing difficulty with dexterity in her hands as well as poor balance.  She was having trouble using her phone as well as difficulty with buttons and clasping her necklaces.  She continued to have ongoing neck and left arm and shoulder pain with numbness as well as low back pain that extends into her buttocks.  She had denied any recent falls.  Recent imaging showed severe central canal stenosis at C4-5 C5-6 with foraminal stenosis from C3-7.  Most recent preoperative labs showed a hemoglobin of 12.8 hematocrit 40.3 on 03/03/2024 and unremarkable chemistries.  Underwent posterior segmental instrumentation of C3-7 as well as posterior lateral arthrodesis from C5-7 with cervical laminectomy from C4-C6 for decompression of the spinal cord with bilateral foraminotomies and facetectomies at C3/4 C4-5 C5-6 and left foraminotomies C6-7 03/15/2024 per Dr. Reeves Nine.  Laced in a cervical brace for comfort.  She did complete a course of Decadron  taper.  Patient control with pain with the use of Dilaudid  as well as Valium .  Lovenox  added for DVT prophylaxis 03/16/2024.  Follow-up imaging CT/MRI cervical spine 03/16/2024 due to complaints of increasing neck pain and acute left arm weakness showed noted degenerative changes moderate to severe spinal  canal stenosis C3-4 with mild cord compression without definitive cord signal abnormality.  Multilevel foraminal stenosis greatest and severe bilaterally at C3-4 C5-6 on the left at C6-7.  Moderate to severe spinal canal stenosis on the right at C6-7.  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  Therapy evaluations completed due to patient decreased functional mobility was admitted for a comprehensive rehab program.   PRECAUTIONS: Cervical, wears soft cervical collar prn   WEIGHT BEARING RESTRICTIONS: Yes ; no lifting >10 lbs  PAIN: 05/11/24: 2-4/10 pain L shoulder  7/18/225: 4-5/10 left shoulder following PT.  04/20/24: 2/10 L shoulder, 7-8/10 pain at times with activity 04/15/24: 2/10 pain  in the LUE in supine described as sharp nerve pain. 04/13/24: 3/10 pain in the left shoulder during standing, and pain in supine at approximately 45 degrees of ROM 04/08/24: 3/10 at rest, 8-10 with activity Are you having pain? Yes: NPRS scale: 4/10 pain at rest, 8/10 with activity Pain location: neck, shoulders down to hands Pain description: nerve pain Aggravating factors: walking, sitting, standing, moving L arm  Relieving factors: reclining  FALLS: Has patient fallen in last 6 months? 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    OBJECTIVE:  Note: Objective measures were completed at Evaluation unless otherwise noted.  HAND DOMINANCE: Right  ADLs: Overall ADLs: Caregiver assist for ADLs Transfers/ambulation related to ADLs: using rollator at home for balance and to take pressure off shoulders and back Eating: difficulty cutting food Grooming: Max A with hair care d/t BUE weakness UB Dressing: Limited engagement of LUE LB Dressing: Difficulty hiking pants d/t limited engagement of the LUE Toileting: modified indep using R dominant/less affected arm Bathing: supv for tub/shower transfers; assist to wash upper R side of body; reports her long handled sponge is ineffective Tub Shower transfers: transfer tub bench in place but able to step over shower with supv and sits on bench prn throughout shower Equipment: Transfer tub bench  IADLs: Shopping: dep Light housekeeping: Max A; pt reports she can load washing machine but is unable to load/unload dishwasher d/t the bending required Meal Prep: Partner managing meals Community mobility: increased pain/limited Medication management: indep Financial management: indep  POSTURE COMMENTS:  rounded shoulders and forward head  ACTIVITY TOLERANCE: Activity tolerance: Limited tolerance for sitting and standing; more comfortable in supine (performed eval partially supine on mat to manage pain)  UPPER EXTREMITY ROM:    Active ROM Right eval Left eval Left 05/11/24  Shoulder flexion 55 30 45  Shoulder abduction 55 20 20  Shoulder adduction     Shoulder extension     Shoulder internal rotation WNL Unable to attempt behind back Pt crawls fingers around hip and buttocks  Shoulder external rotation -20 Unable to attempt behind head Unable to attempt behind head against gravity  Elbow flexion     Elbow extension     Wrist flexion     Wrist extension     Wrist ulnar deviation     Wrist radial deviation     Wrist pronation     Wrist supination     (Blank rows = not  tested)  UPPER EXTREMITY MMT:     MMT Right eval Left eval  Shoulder flexion 2+ 2-  Shoulder abduction 2+ 2-  Shoulder adduction    Shoulder extension    Shoulder internal rotation 4 2+  Shoulder external rotation 3- 2  Middle trapezius    Lower trapezius    Elbow flexion 4+ 1 to 2- (will assess more thoroughly next session)  Elbow extension 5 4  Wrist flexion 5 4  Wrist extension 5 4  Wrist ulnar deviation    Wrist radial deviation    Wrist pronation 5 3+  Wrist supination 5 3-  (Blank rows = not tested)  HAND FUNCTION: Grip strength: Right: 68 lbs; Left: 34 lbs, Lateral pinch: Right: 13 lbs, Left: 7 lbs, and 3 point pinch: Right: 8 lbs, Left: 5 lbs  COORDINATION: 9 Hole Peg test: Right: 24 sec; Left: 47 sec  SENSATION: tingling/numbness in L hand digits 1-3, diminished up the L arm, no tingling in the RUE  COGNITION: Overall cognitive status: Within functional limits for tasks  assessed  VISION: WNL  OBSERVATIONS:  Pt visibly in pain in with facial grimacing during shoulder shrugs  NEXT MD APPT: Return to neuro surgeon July 29.                                                                                                                            TREATMENT DATE: 05/11/24  Therapeutic Exercise: In supine: -AAROM for L shoulder all planes to end range tolerance x2 sets 10 reps -Assist with slow, eccentric L shoulder flexion with therapist guiding as needed.  2 sets 10 reps -AAROM L biceps curl with therapist maintaining supinated forearm to isolate biceps x 2 sets 10 reps -Light manual resistance for L triceps extension x2 sets 10 reps each Cues provided for exercises for proper form and technique, guarding as needed and guiding for movement patterns. -HEP reviewed  Self Care: -Review of indications for wearing L arm sling with return to work next week and offered alternate options of LUE support with clothing  -Assisted pt to brainstorm questions for neuro  surgeon as pt has upcoming appt to discuss possibility of nerve transfer  PATIENT EDUCATION: Education details: HEP review, arm sling/positioning Person educated: Patient and Caregiver   Education method: Explanation, Demonstration, and Verbal cues Education comprehension: verbalized understanding, verbal cues required, and needs further education  HOME EXERCISE PROGRAM: 04/26/24:  HEP through [employer]: -AAROM at the tabletop for internal, and external rotation. -Shoulder flexion/extension AAROM with dowel/cane base blocked at the wall -Shoulder circles AAROM with the dowel/cane into wall- with base blacked at the wall -Elbow flexion, extension extension AAROM with dowel/cane into wall with base of dowel/cane supported at the wall.  04/20/24:-LUE AAROM/AROM to tolerance and within cervical precautions; shoulder shrugs and retraction in front of mirror, theraputty 04/15/24: -positioning for comfort 04/13/24: -Bilateral AAROM Shoulder flexion and abduction tabletop exercises while seated. -AROM, and resistive forearm supination sitting with the LUE supported on pillows. -Supine left elbow flexion in gravity eliminated plane, progressing to partial gravity with resistance. 04/08/24: Refer to treatment note for HEP details.  GOALS: Goals reviewed with patient? Yes  SHORT TERM GOALS: Target date: 05/18/24  Pt will be indep to perform HEP for improving BUE strength/flexibility/coordination for daily tasks. Baseline: Eval: Not yet initiated Goal status: INITIAL  LONG TERM GOALS: Target date: 06/29/24  Pt will increase L grip strength by 20 lbs or more in order to improve ability to grasp and carry heavier ADL supplies.  Baseline: Eval: L grip 34 lbs (R 68 lbs) Goal status: INITIAL  2.  Pt will increase L lateral pinch strength by 3 or more lbs to maintain grasp of fork for independence with cutting food. Baseline: Eval: L 7 lbs (R 13 lbs) Goal status: INITIAL  3.  Pt will increase R  shoulder and LUE strength by 1 MM grade or more to better engage BUEs into ADLs.  Baseline: Eval: See above MMT; pt must use R hand to lift LUE  up from lap to table top; L shoulder mobility BFL, R limited below 90*.  Pt predominately performing ADLs with R dominant arm. Goal status: INITIAL  4.  Pt will increase L hand FMC skills to manage clothing fasteners independently and manipulate small ADL supplies with reduced dropping.  Baseline: Eval: L 9 hole peg test 47 sec (R 24 sec)  5.  Pt will tolerate manual therapy, therapeutic modalities, and exercises to decrease pain in bilat shoulders/LUE to a reported 5/10 pain or less with activity.  Baseline: Eval: up to 10/10 pain with activity   Goal status: INITIAL  ASSESSMENT: CLINICAL IMPRESSION: Pt continues to progress well functionally, limited still without deltoid activation.  OT reviewed continued focus on strengthening accessory muscles for continued functional gains.  Pt reports she has an upcoming visit with neuro surgery to discuss possibility or nerve transplant.  Pain is steadily improving.  Pt is anticipating return to work next week but in a limited capacity.  Pt verbalized understanding of positioning strategies and indications for sling as needed to support L shoulder sublux during her work day.  Will continue to work towards ROM, strength and control of LUE to facilitate use in daily functional tasks.  Pt. continues to benefit from skilled OT to address above noted deficits and work towards above noted goals in OT poc in order to maximize indep with daily tasks.   PERFORMANCE DEFICITS: in functional skills including ADLs, IADLs, coordination, dexterity, sensation, ROM, strength, pain, flexibility, Fine motor control, Gross motor control, mobility, balance, endurance, decreased knowledge of precautions, decreased knowledge of use of DME, skin integrity, and UE functional use, cognitive skills including emotional, and psychosocial skills  including coping strategies, environmental adaptation, habits, and routines and behaviors.   IMPAIRMENTS: are limiting patient from ADLs, IADLs, rest and sleep, work, leisure, and social participation.   CO-MORBIDITIES: has co-morbidities such as anxiety/depression, HTN, asthma, DM2, DDD (lumbar) that affects occupational performance. Patient will benefit from skilled OT to address above impairments and improve overall function.  MODIFICATION OR ASSISTANCE TO COMPLETE EVALUATION: Min-Moderate modification of tasks or assist with assess necessary to complete an evaluation.  OT OCCUPATIONAL PROFILE AND HISTORY: Detailed assessment: Review of records and additional review of physical, cognitive, psychosocial history related to current functional performance.  CLINICAL DECISION MAKING: Moderate - several treatment options, min-mod task modification necessary  REHAB POTENTIAL: Good  EVALUATION COMPLEXITY: Moderate  PLAN:  OT FREQUENCY: 2x/week  OT DURATION: 12 weeks  PLANNED INTERVENTIONS: 97168 OT Re-evaluation, 97535 self care/ADL training, 02889 therapeutic exercise, 97530 therapeutic activity, 97112 neuromuscular re-education, 97140 manual therapy, 97116 gait training, 02989 moist heat, 97010 cryotherapy, 97034 contrast bath, 97750 Physical Performance Testing, 02239 Orthotic Initial, 97763 Orthotic/Prosthetic subsequent, passive range of motion, balance training, functional mobility training, psychosocial skills training, energy conservation, coping strategies training, patient/family education, and DME and/or AE instructions  RECOMMENDED OTHER SERVICES: None at this time  CONSULTED AND AGREED WITH PLAN OF CARE: Patient and family member/caregiver  PLAN FOR NEXT SESSION: see above  Inocente Blazing, MS, OTR/L  05/12/2024, 9:41 PM

## 2024-05-13 ENCOUNTER — Ambulatory Visit: Admitting: Physical Therapy

## 2024-05-13 ENCOUNTER — Telehealth: Payer: Self-pay | Admitting: Neurosurgery

## 2024-05-13 ENCOUNTER — Encounter: Payer: Self-pay | Admitting: Occupational Therapy

## 2024-05-13 ENCOUNTER — Ambulatory Visit: Admitting: Occupational Therapy

## 2024-05-13 DIAGNOSIS — M6281 Muscle weakness (generalized): Secondary | ICD-10-CM

## 2024-05-13 DIAGNOSIS — G959 Disease of spinal cord, unspecified: Secondary | ICD-10-CM

## 2024-05-13 DIAGNOSIS — R278 Other lack of coordination: Secondary | ICD-10-CM

## 2024-05-13 NOTE — Telephone Encounter (Signed)
 Spoke with Eileen Chaney regarding her slow recovery and working with PTOT.  I will communicate with her PT.  She has had some improvements but has some continued challenges, particularly with lifting her arm to the side and forward.  I suspect her deltoid is still weak given her description and her EMG results.  Will continue PT.  Will work with Dr. Claudene and see whether she is a candidate for triceps to deltoid transfer if she does not show signs of recovery.  I expressed that I think this is likely secondary to traction from positioning.    CY

## 2024-05-13 NOTE — Therapy (Signed)
 OUTPATIENT OCCUPATIONAL THERAPY NEURO PROGRESS NOTE  Dates of reporting period  04/06/24   to   05/13/24   Patient Name: Eileen Chaney MRN: [id number] DOB:1976-07-05, 48 y.o., female Today's Date: 05/13/2024  PCP: Dr. Alm Needle REFERRING PROVIDER: Toribio Pitch, PA-C  END OF SESSION:  OT End of Session - 05/13/24 1152     Visit Number 10    Number of Visits 24    Date for OT Re-Evaluation 06/29/24    OT Start Time 1152    OT Stop Time 1230    OT Time Calculation (min) 38 min    Activity Tolerance Patient tolerated treatment well    Behavior During Therapy WFL for tasks assessed/performed         Past Medical History:  Diagnosis Date   Anxiety    Arthritis    Asthma    Cervical myelopathy with cervical radiculopathy (HCC)    Complication of anesthesia    with c sections had a had time getting her sedated   DDD (degenerative disc disease), cervical    Depression    Diabetes mellitus without complication (HCC)    type 2   Family history of adverse reaction to anesthesia    mother hard to wake up   Fibromyalgia    Hyperlipidemia    Hypertension    Idiopathic urticaria    Pneumonia    PONV (postoperative nausea and vomiting)    Undifferentiated inflammatory arthritis    Past Surgical History:  Procedure Laterality Date   ABDOMINAL HYSTERECTOMY  2018   uterine fibroids   ANKLE SURGERY Right 2022   ligament repair   ANTERIOR CERVICAL DECOMP/DISCECTOMY FUSION N/A 11/12/2021   Procedure: C3-5 ANTERIOR CERVICAL DISCECTOMY AND FUSION (GLOBUS HEDRON);  Surgeon: Clois Fret, MD;  Location: [facility];  Service: Neurosurgery;  Laterality: N/A;   CERVICAL WOUND DEBRIDEMENT N/A 01/20/2022   Procedure: posterior wound debridment;  Surgeon: Clois Fret, MD;  Location: [facility];  Service: Neurosurgery;  Laterality: N/A;   CESAREAN SECTION     X3 2000, 2001, 2009   DILATION AND CURETTAGE OF UTERUS     POSTERIOR CERVICAL FUSION/FORAMINOTOMY N/A 03/15/2024    Procedure: POSTERIOR CERVICAL FUSION/FORAMINOTOMY LEVEL 4;  Surgeon: Clois Fret, MD;  Location: [facility];  Service: Neurosurgery;  Laterality: N/A;  C4-7 Posterior Spinal Decompression C4-7 Posterior Spinal Instrumentation C5-7 Fusion   POSTERIOR CERVICAL LAMINECTOMY N/A 01/07/2022   Procedure: OPEN C5-7 POSTERIOR DECOMPRESSION;  Surgeon: Clois Fret, MD;  Location: [facility];  Service: Neurosurgery;  Laterality: N/A;   TONSILLECTOMY  2000   WISDOM TOOTH EXTRACTION     Patient Active Problem List   Diagnosis Date Noted   Anxiety with depression 03/24/2024   Cervical myelopathy (HCC) 03/15/2024   Spinal stenosis in cervical region 03/15/2024   Cervical radiculopathy 03/15/2024   S/P cervical spinal fusion 03/15/2024   Wound infection after surgery 01/20/2022   Gestational diabetes 06/02/2020   Arthritis 10/11/2019   DDD (degenerative disc disease), lumbar 10/11/2019   Hyperlipidemia 10/11/2019   Idiopathic urticaria 10/11/2019   Recurrent major depressive disorder, in partial remission (HCC) 10/11/2019   Spinal stenosis of lumbar region without neurogenic claudication 10/11/2019   Type 2 diabetes mellitus without complication, with long-term current use of insulin  (HCC) 10/11/2019   Elevated lactic acid level 09/29/2017   Leukocytosis 09/29/2017   Diabetes (HCC) 09/28/2017   Hypertension 09/28/2017   Mild asthma without complication 09/28/2017   Morbid obesity (HCC) 09/28/2017   Thalassemia 09/28/2017   GAD (generalized  anxiety disorder) 07/25/2014   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  Pt accompanied by: self  PERTINENT HISTORY: Per chart: CIR d/c summary: Eileen Chaney is a 48 y.o. right-handed female with history significant for anxiety/depression as well as component of ADHD maintained on Ritalin , asthma, class I obesity with BMI 35.28 diabetes mellitus fibromyalgia hyperlipidemia hypertension posterior cervical laminectomy 01/07/2022, anterior cervical decompressive discectomy fusion 11/12/2021 complicated by cervical wound debridement 01/20/2022.  Per chart review patient lives with daughter.  She has good support from her family.  Two-level home bed and bath on main level independent community ambulator.  Presented to [facility]24 with increasing difficulty with dexterity in her hands as well as poor balance.  She was having trouble using her phone as well as difficulty with buttons and clasping her necklaces.  She continued to have ongoing neck and left arm and shoulder pain with numbness as well as low back pain that extends into her buttocks.  She had denied any recent falls.  Recent imaging showed severe central canal stenosis at C4-5 C5-6 with foraminal stenosis from C3-7.  Most recent preoperative labs showed a hemoglobin of 12.8 hematocrit 40.3 on 03/03/2024 and unremarkable chemistries.  Underwent posterior segmental instrumentation of C3-7 as well as posterior lateral arthrodesis from C5-7 with cervical laminectomy from C4-C6 for decompression of the spinal cord with bilateral foraminotomies and facetectomies at C3/4 C4-5 C5-6 and left foraminotomies C6-7 03/15/2024 per Dr. Reeves Nine.  Laced in a cervical brace for comfort.  She did complete a course of Decadron  taper.  Patient control with pain with the use of Dilaudid  as well as Valium .  Lovenox  added for DVT prophylaxis 03/16/2024.  Follow-up imaging CT/MRI cervical spine 03/16/2024 due to complaints of increasing neck pain and acute left arm weakness showed noted degenerative  changes moderate to severe spinal canal stenosis C3-4 with mild cord compression without definitive cord signal abnormality.  Multilevel foraminal stenosis greatest and severe bilaterally at C3-4 C5-6 on the left at C6-7.  Moderate to severe spinal canal stenosis on the right at C6-7.  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  Therapy evaluations completed due to patient decreased functional mobility was admitted for a comprehensive rehab program.   PRECAUTIONS: Cervical,   WEIGHT BEARING RESTRICTIONS: Yes ; no lifting >10 lbs  PAIN: 05/13/24: L shoulder 1/10 05/11/24: 2-4/10 pain L shoulder  7/18/225: 4-5/10 left shoulder following PT.  04/20/24: 2/10 L shoulder, 7-8/10 pain at times with activity 04/15/24: 2/10 pain  in the LUE in supine described as sharp nerve pain. 04/13/24: 3/10 pain in the left shoulder during standing, and pain in supine at approximately 45 degrees of ROM 04/08/24: 3/10 at rest, 8-10 with activity Are you having pain? Yes: NPRS scale: 4/10 pain at rest, 8/10 with activity Pain location: neck, shoulders down to hands Pain description: nerve pain Aggravating factors: walking, sitting, standing, moving L arm  Relieving factors: reclining  FALLS: Has patient fallen in last 6 months? 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   OBJECTIVE:  Note: Objective measures were completed at Evaluation unless otherwise noted.  HAND DOMINANCE: Right  ADLs: Overall ADLs: Caregiver assist for ADLs Transfers/ambulation related to ADLs: using rollator at home for balance and to take pressure off shoulders and back Eating: difficulty cutting food Grooming: Max A with hair care d/t BUE weakness UB Dressing: Limited engagement of LUE LB Dressing: Difficulty hiking pants d/t limited engagement of the LUE Toileting: modified indep using R dominant/less affected arm Bathing: supv for tub/shower transfers; assist to wash upper R side of body; reports her long handled sponge is ineffective Tub Shower transfers: transfer tub bench in place but able to step over shower with supv and sits on bench prn throughout shower Equipment: Transfer tub bench  IADLs: Shopping: dep Light housekeeping: Max A; pt reports she can load washing machine but is unable to load/unload dishwasher d/t the bending required Meal Prep: Partner managing meals Community mobility: increased pain/limited Medication management: indep Financial management: indep  POSTURE COMMENTS:  rounded shoulders and forward head  ACTIVITY TOLERANCE: Activity tolerance: Limited tolerance for sitting and standing; more comfortable in supine (performed eval partially supine on mat to manage pain)  UPPER EXTREMITY ROM:    Active ROM Right eval Left eval Left 05/11/24  Shoulder flexion 55 30 45  Shoulder abduction 55 20 20  Shoulder adduction     Shoulder extension     Shoulder internal rotation WNL Unable to attempt behind back Pt crawls fingers around hip and buttocks  Shoulder external rotation -20 Unable to attempt behind head Unable to attempt behind head against gravity  Elbow flexion     Elbow extension     Wrist flexion     Wrist extension     Wrist ulnar deviation     Wrist radial deviation     Wrist pronation     Wrist supination      (Blank rows = not tested)  UPPER EXTREMITY MMT:     MMT Right eval R 05/13/24 Left eval L 05/13/24  Shoulder flexion 2+ 3+/5 2- 2-/5  Shoulder abduction 2+ 2+/5 2- 2-/5  Shoulder adduction      Shoulder extension      Shoulder internal rotation 4 4 2+ 3-/5  Shoulder external rotation 3- 3+ 2 2/5  Middle trapezius      Lower trapezius      Elbow flexion 4+ 5 1 to 2- (will assess more thoroughly next session) 2-/5  Elbow extension 5  4 4+/5  Wrist flexion 5  4 5   Wrist extension 5  4 5   Wrist ulnar deviation      Wrist radial deviation      Wrist pronation 5  3+ 5/5  Wrist supination 5  3- 4/5  (Blank rows = not tested)  HAND FUNCTION: Grip strength: Right: 68 lbs; Left: 34 lbs, Lateral pinch: Right: 13 lbs, Left: 7 lbs, and  3 point pinch: Right: 8 lbs, Left: 5 lbs 05/13/24: Grip strength: Right: 67 lbs; Left: 55 lbs, Lateral pinch: Right: 16 lbs, Left: 12 lbs, and 3 point pinch: Right: 13 lbs, Left: 13 lbs  COORDINATION: 9 Hole Peg test: Right: 24 sec; Left: 47 sec  05/23/24: 9 Hole Peg test: Right: 25 sec; Left: 47 sec  SENSATION: tingling/numbness in L hand digits 1-3, diminished up the L arm, no tingling in the RUE  COGNITION: Overall cognitive status: Within functional limits for tasks assessed  VISION: WNL  OBSERVATIONS:  Pt visibly in pain in with facial grimacing during shoulder shrugs  NEXT MD APPT: Return to neuro surgeon Sept.                                                                                                                            TREATMENT DATE: 05/13/24  Therapeutic Exercise: -Measurements obtained as above -HEP reviewed  Self Care: -Review of indications for wearing L arm sling with return to work next week and offered alternate options of LUE support with clothing  -Educated on bath mitt for bathing - pt reports dropping washcloth in L hand when washing back of R arm/R side. -Reviewed modifications for safe return to work including  wearing sling  PATIENT EDUCATION: Education details: HEP review, arm sling/positioning Person educated: Patient and Passenger transport manager method: Programmer, multimedia, Demonstration, and Verbal cues Education comprehension: verbalized understanding, verbal cues required, and needs further education  HOME EXERCISE PROGRAM: 04/26/24:  HEP through [employer]: -AAROM at the tabletop for internal, and external rotation. -Shoulder flexion/extension AAROM with dowel/cane base blocked at the wall -Shoulder circles AAROM with the dowel/cane into wall- with base blacked at the wall -Elbow flexion, extension extension AAROM with dowel/cane into wall with base of dowel/cane supported at the wall.  04/20/24:-LUE AAROM/AROM to tolerance and within cervical precautions; shoulder shrugs and retraction in front of mirror, theraputty 04/15/24: -positioning for comfort 04/13/24: -Bilateral AAROM Shoulder flexion and abduction tabletop exercises while seated. -AROM, and resistive forearm supination sitting with the LUE supported on pillows. -Supine left elbow flexion in gravity eliminated plane, progressing to partial gravity with resistance. 04/08/24: Refer to treatment note for HEP details.  GOALS: Goals reviewed with patient? Yes  SHORT TERM GOALS: Target date: 05/18/24  Pt will be indep to perform HEP for improving BUE strength/flexibility/coordination for daily tasks. Baseline: Eval: Not yet initiated. 05/13/24: performs HEP with handout Goal status: Achieved  LONG TERM GOALS: Target date: 06/29/24  Pt will increase L grip strength by 20 lbs or more in order to improve ability to grasp and carry heavier ADL supplies.  Baseline: Eval: L grip 34 lbs (R 68 lbs). 05/13/24: L grip 55 lbs Goal status: Achieved  2.  Pt will increase L lateral pinch strength by 3 or more lbs to maintain grasp of fork for independence with cutting food. Baseline: Eval: L 7 lbs (R 13 lbs). 05/13/24: L 12 lbs Goal status: Achieved  3.   Pt will increase R shoulder and LUE strength by 1 MM grade or more to better engage BUEs into ADLs.  Baseline: Eval: See above MMT; pt must use R hand to lift LUE up from lap to table top; L shoulder mobility BFL, R limited below 90*.  Pt predominately performing ADLs with R dominant arm. 1/85/74: Achieved with R hand improving as seen above, L wrist achieved, L shoulder remains the same.  Goal status: Ongoing  4.  Pt will increase L hand FMC skills to manage clothing fasteners independently and manipulate small ADL supplies with reduced dropping.  Baseline: Eval: L 9 hole peg test 47 sec (R 24 sec). 05/13/24: L 47 sec.  Goal status: Ongoing  5.  Pt will tolerate manual therapy, therapeutic modalities, and exercises to decrease pain in bilat shoulders/LUE to a reported 5/10 pain or less with activity.  Baseline: Eval: up to 10/10 pain with activity. 05/13/24: 1/20 at rest, 5/10 with activity.  Goal status: Achieved  ASSESSMENT: CLINICAL IMPRESSION: Measurements obtained and goals reviewed this session. Pt continues to progress well functionally, but limited still without deltoid activation.  Pt has achieved multiple goals including pain, L grip, and L pinch strength. Pt is anticipating return to work next week but in a limited capacity. Discussed use of bath mitt to decrease time spent in shower. Initiated ECS education as pt returns to work. Pt verbalized understanding of positioning strategies and indications for sling as needed to support L shoulder sublux during her work day. Will continue to work towards ROM, strength and control of LUE to facilitate use in daily functional tasks.  Pt. continues to benefit from skilled OT to address above noted deficits and work towards above noted goals in OT poc in order to maximize indep with daily tasks.   PERFORMANCE DEFICITS: in functional skills including ADLs, IADLs, coordination, dexterity, sensation, ROM, strength, pain, flexibility, Fine motor control,  Gross motor control, mobility, balance, endurance, decreased knowledge of precautions, decreased knowledge of use of DME, skin integrity, and UE functional use, cognitive skills including emotional, and psychosocial skills including coping strategies, environmental adaptation, habits, and routines and behaviors.   IMPAIRMENTS: are limiting patient from ADLs, IADLs, rest and sleep, work, leisure, and social participation.   CO-MORBIDITIES: has co-morbidities such as anxiety/depression, HTN, asthma, DM2, DDD (lumbar) that affects occupational performance. Patient will benefit from skilled OT to address above impairments and improve overall function.  MODIFICATION OR ASSISTANCE TO COMPLETE EVALUATION: Min-Moderate modification of tasks or assist with assess necessary to complete an evaluation.  OT OCCUPATIONAL PROFILE AND HISTORY: Detailed assessment: Review of records and additional review of physical, cognitive, psychosocial history related to current functional performance.  CLINICAL DECISION MAKING: Moderate - several treatment options, min-mod task modification necessary  REHAB POTENTIAL: Good  EVALUATION COMPLEXITY: Moderate  PLAN:  OT FREQUENCY: 2x/week  OT DURATION: 12 weeks  PLANNED INTERVENTIONS: 97168 OT Re-evaluation, 97535 self care/ADL training, 02889 therapeutic exercise, 97530 therapeutic activity, 97112 neuromuscular re-education, 97140 manual therapy, 97116 gait training, 02989 moist heat, 97010 cryotherapy, 97034 contrast bath, 97750 Physical Performance Testing, 02239 Orthotic Initial, 97763 Orthotic/Prosthetic subsequent, passive range of motion, balance training, functional mobility training, psychosocial skills training, energy conservation, coping strategies training, patient/family education, and DME and/or AE instructions  RECOMMENDED OTHER SERVICES: None at this time  CONSULTED AND AGREED WITH PLAN OF CARE: Patient and family member/caregiver  PLAN FOR NEXT  SESSION: see above  Elston Slot, M.S. OTR/L  05/13/24, 11:53 AM  ascom  [phone]   05/13/2024, 11:52 AM

## 2024-05-18 ENCOUNTER — Ambulatory Visit

## 2024-05-20 ENCOUNTER — Encounter

## 2024-05-20 ENCOUNTER — Ambulatory Visit: Admitting: Physical Therapy

## 2024-05-20 ENCOUNTER — Ambulatory Visit

## 2024-05-20 DIAGNOSIS — M6281 Muscle weakness (generalized): Secondary | ICD-10-CM

## 2024-05-20 DIAGNOSIS — G959 Disease of spinal cord, unspecified: Secondary | ICD-10-CM

## 2024-05-20 DIAGNOSIS — R278 Other lack of coordination: Secondary | ICD-10-CM

## 2024-05-21 ENCOUNTER — Ambulatory Visit: Admitting: Physical Therapy

## 2024-05-22 ENCOUNTER — Other Ambulatory Visit: Payer: Self-pay | Admitting: Medical Genetics

## 2024-05-22 NOTE — Therapy (Signed)
 OUTPATIENT OCCUPATIONAL THERAPY NEURO TREATMENT NOTE  Patient Name: Eileen Chaney MRN: [id number] DOB:Jan 01, 1976, 48 y.o., female Today's Date: 05/22/2024  PCP: Dr. Alm Needle REFERRING PROVIDER: Toribio Pitch, PA-C  END OF SESSION:  OT End of Session - 05/22/24 2130     Visit Number 11    Number of Visits 24    Date for OT Re-Evaluation 06/29/24    OT Start Time 1535    OT Stop Time 1615    OT Time Calculation (min) 40 min    Activity Tolerance Patient tolerated treatment well    Behavior During Therapy WFL for tasks assessed/performed         Past Medical History:  Diagnosis Date   Anxiety    Arthritis    Asthma    Cervical myelopathy with cervical radiculopathy (HCC)    Complication of anesthesia    with c sections had a had time getting her sedated   DDD (degenerative disc disease), cervical    Depression    Diabetes mellitus without complication (HCC)    type 2   Family history of adverse reaction to anesthesia    mother hard to wake up   Fibromyalgia    Hyperlipidemia    Hypertension    Idiopathic urticaria    Pneumonia    PONV (postoperative nausea and vomiting)    Undifferentiated inflammatory arthritis    Past Surgical History:  Procedure Laterality Date   ABDOMINAL HYSTERECTOMY  2018   uterine fibroids   ANKLE SURGERY Right 2022   ligament repair   ANTERIOR CERVICAL DECOMP/DISCECTOMY FUSION N/A 11/12/2021   Procedure: C3-5 ANTERIOR CERVICAL DISCECTOMY AND FUSION (GLOBUS HEDRON);  Surgeon: Clois Fret, MD;  Location: [facility];  Service: Neurosurgery;  Laterality: N/A;   CERVICAL WOUND DEBRIDEMENT N/A 01/20/2022   Procedure: posterior wound debridment;  Surgeon: Clois Fret, MD;  Location: [facility];  Service: Neurosurgery;  Laterality: N/A;   CESAREAN SECTION     X3 2000, 2001, 2009   DILATION AND CURETTAGE OF UTERUS     POSTERIOR CERVICAL FUSION/FORAMINOTOMY N/A 03/15/2024   Procedure: POSTERIOR CERVICAL FUSION/FORAMINOTOMY  LEVEL 4;  Surgeon: Clois Fret, MD;  Location: [facility];  Service: Neurosurgery;  Laterality: N/A;  C4-7 Posterior Spinal Decompression C4-7 Posterior Spinal Instrumentation C5-7 Fusion   POSTERIOR CERVICAL LAMINECTOMY N/A 01/07/2022   Procedure: OPEN C5-7 POSTERIOR DECOMPRESSION;  Surgeon: Clois Fret, MD;  Location: [facility];  Service: Neurosurgery;  Laterality: N/A;   TONSILLECTOMY  2000   WISDOM TOOTH EXTRACTION     Patient Active Problem List   Diagnosis Date Noted   Anxiety with depression 03/24/2024   Cervical myelopathy (HCC) 03/15/2024   Spinal stenosis in cervical region 03/15/2024   Cervical radiculopathy 03/15/2024   S/P cervical spinal fusion 03/15/2024   Wound infection after surgery 01/20/2022   Gestational diabetes 06/02/2020   Arthritis 10/11/2019   DDD (degenerative disc disease), lumbar 10/11/2019   Hyperlipidemia 10/11/2019   Idiopathic urticaria 10/11/2019   Recurrent major depressive disorder, in partial remission (HCC) 10/11/2019   Spinal stenosis of lumbar region without neurogenic claudication 10/11/2019   Type 2 diabetes mellitus without complication, with long-term current use of insulin  (HCC) 10/11/2019   Elevated lactic acid level 09/29/2017   Leukocytosis 09/29/2017   Diabetes (HCC) 09/28/2017   Hypertension 09/28/2017   Mild asthma without complication 09/28/2017   Morbid obesity (HCC) 09/28/2017   Thalassemia 09/28/2017   GAD (generalized anxiety disorder) 07/25/2014   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, unspecified  03/14/2014   ONSET DATE: 03/15/24  REFERRING DIAG:  G95.9 (ICD-10-CM) - Cervical myelopathy (HCC)  Z98.1 (ICD-10-CM) - S/P cervical spinal fusion   THERAPY DIAG:  Muscle weakness (generalized)  Other lack of coordination  Cervical myelopathy (HCC)  Rationale for Evaluation and Treatment: Rehabilitation  SUBJECTIVE:  SUBJECTIVE STATEMENT: Pt reports her first week back to work was busy and  exhausting, but did not seem to leave her in more pain.  Pt accompanied by: self  PERTINENT HISTORY: Per chart: CIR d/c summary: Lenoir C Nusser is a 48 y.o. right-handed female with history significant for anxiety/depression as well as component of ADHD maintained on Ritalin , asthma, class I obesity with BMI 35.28 diabetes mellitus fibromyalgia hyperlipidemia hypertension posterior cervical laminectomy 01/07/2022, anterior cervical decompressive discectomy fusion 11/12/2021 complicated by cervical wound debridement 01/20/2022.  Per chart review patient lives with daughter.  She has good support from her family.  Two-level home bed and bath on main level independent community ambulator.  Presented to [facility] 03/15/2024 with increasing difficulty with dexterity in her hands as well as poor balance.  She was having trouble using her phone as well as difficulty with buttons and clasping her necklaces.  She continued to have ongoing neck and left arm and shoulder pain with numbness as well as low back pain that extends into her buttocks.  She had denied any recent falls.  Recent imaging showed severe central canal stenosis at C4-5 C5-6 with foraminal stenosis from C3-7.  Most recent preoperative labs showed a hemoglobin of 12.8 hematocrit 40.3 on 03/03/2024 and unremarkable chemistries.  Underwent posterior segmental instrumentation of C3-7 as well as posterior lateral arthrodesis from C5-7 with cervical laminectomy from C4-C6 for decompression of the spinal cord with bilateral foraminotomies and facetectomies at C3/4 C4-5 C5-6 and left foraminotomies C6-7 03/15/2024 per Dr. Reeves Nine.  Laced in a cervical brace for comfort.  She did complete a course of Decadron  taper.  Patient control with pain with the use of Dilaudid  as well as Valium .  Lovenox  added for DVT prophylaxis 03/16/2024.  Follow-up imaging CT/MRI cervical spine 03/16/2024 due to complaints of increasing neck pain and acute left arm weakness showed  noted degenerative changes moderate to severe spinal canal stenosis C3-4 with mild cord compression without definitive cord signal abnormality.  Multilevel foraminal stenosis greatest and severe bilaterally at C3-4 C5-6 on the left at C6-7.  Moderate to severe spinal canal stenosis on the right at C6-7.  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  Therapy evaluations completed due to patient decreased functional mobility was admitted for a comprehensive rehab program.   PRECAUTIONS: Cervical,   WEIGHT BEARING RESTRICTIONS: Yes ; no lifting >10 lbs  PAIN: 05/20/24: L shoulder 1-2/10 05/13/24: L shoulder 1/10 05/11/24: 2-4/10 pain L shoulder  7/18/225: 4-5/10 left shoulder following PT.  04/20/24: 2/10 L shoulder, 7-8/10 pain at times with activity 04/15/24: 2/10 pain  in the LUE in supine described as sharp nerve pain. 04/13/24: 3/10 pain in the left shoulder during standing, and pain in supine at approximately 45 degrees of ROM 04/08/24: 3/10 at rest, 8-10 with activity Are you having pain? Yes: NPRS scale: 4/10 pain at rest, 8/10 with activity Pain location: neck, shoulders down to hands Pain description: nerve pain Aggravating factors: walking, sitting, standing, moving L arm  Relieving factors: reclining  FALLS: Has patient fallen in last 6  months? 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   OBJECTIVE:  Note: Objective measures were completed at Evaluation unless otherwise noted.  HAND DOMINANCE: Right  ADLs: Overall ADLs: Caregiver assist for ADLs Transfers/ambulation related to ADLs: using rollator at home for balance and to take pressure off shoulders and back Eating: difficulty cutting food Grooming: Max A with hair care d/t BUE weakness UB Dressing: Limited engagement of LUE LB Dressing: Difficulty hiking pants d/t limited engagement of the LUE Toileting: modified indep using R dominant/less affected arm Bathing: supv for tub/shower transfers; assist to wash upper R side of body; reports her long handled sponge is ineffective Tub Shower transfers: transfer tub bench in place but able to step over shower with supv and sits on bench prn throughout shower Equipment: Transfer tub bench  IADLs: Shopping: dep Light housekeeping: Max A; pt reports she can load washing machine but is unable to load/unload dishwasher d/t the bending required Meal Prep: Partner managing meals Community mobility: increased pain/limited Medication management: indep Financial management: indep  POSTURE COMMENTS:  rounded shoulders and forward head  ACTIVITY TOLERANCE: Activity tolerance: Limited tolerance for sitting and standing; more comfortable in supine (performed eval partially supine on mat to manage pain)  UPPER EXTREMITY ROM:    Active ROM Right eval Left eval Left 05/11/24  Shoulder flexion 55 30 45  Shoulder abduction 55 20 20  Shoulder adduction     Shoulder extension     Shoulder internal rotation WNL Unable to attempt behind back Pt crawls fingers around hip and buttocks  Shoulder external rotation -20 Unable to attempt behind head Unable to attempt behind head against gravity  Elbow flexion     Elbow extension     Wrist flexion     Wrist extension     Wrist ulnar deviation     Wrist radial  deviation     Wrist pronation     Wrist supination     (Blank rows = not tested)  UPPER EXTREMITY MMT:     MMT Right eval R 05/13/24 Left eval L 05/13/24  Shoulder flexion 2+ 3+/5 2- 2-/5  Shoulder abduction 2+ 2+/5 2- 2-/5  Shoulder adduction      Shoulder extension      Shoulder internal rotation 4 4 2+ 3-/5  Shoulder external rotation 3- 3+ 2 2/5  Middle trapezius      Lower trapezius      Elbow flexion 4+ 5 1 to 2- (will assess more thoroughly next session) 2-/5  Elbow extension 5  4 4+/5  Wrist flexion 5  4 5   Wrist extension 5  4 5   Wrist ulnar deviation      Wrist radial deviation      Wrist pronation 5  3+ 5/5  Wrist supination 5  3- 4/5  (Blank rows = not tested)  HAND FUNCTION: Grip strength: Right: 68 lbs; Left: 34 lbs, Lateral pinch: Right: 13 lbs, Left:  7 lbs, and 3 point pinch: Right: 8 lbs, Left: 5 lbs 05/13/24: Grip strength: Right: 67 lbs; Left: 55 lbs, Lateral pinch: Right: 16 lbs, Left: 12 lbs, and 3 point pinch: Right: 13 lbs, Left: 13 lbs  COORDINATION: 9 Hole Peg test: Right: 24 sec; Left: 47 sec  05/23/24: 9 Hole Peg test: Right: 25 sec; Left: 47 sec  SENSATION: tingling/numbness in L hand digits 1-3, diminished up the L arm, no tingling in the RUE  COGNITION: Overall cognitive status: Within functional limits for tasks assessed  VISION: WNL  OBSERVATIONS:  Pt visibly in pain in with facial grimacing during shoulder shrugs  NEXT MD APPT: Return to neuro surgeon Sept.                                                                                                                            TREATMENT DATE: 05/20/24  Therapeutic Exercise: -UBE: 5 min: 2.5 forward rotation at min-mod resistance, 2.5 min reverse rotation at min resistance; constant monitoring for necessary resistance adjustments -In supine:  Participation in L shoulder stability exercises with place and hold in 90* shoulder flex, elbow fully extended; min perturbations given in all  directions  Participation in L shoulder stability exercises with place and hold with humerus placed in 45* abd, elbow flexed to 90*, min perturbations given to challenge ER/IR Participation in L elbow stability exercises with place and hold in 90* elbow flex, min perturbations given to challenge flex/ext -AAROM for L elbow flex/ext, forearm pron/sup, L shoulder IR/ER with humerus abd to 45*  Estim (attended) -Trial of Saebostim One, 0-max intensity at the anterior and posterior deltoid for reducing shoulder sublux; no contraction elicited.   -Placement changed to L biceps with good tolerance at low level and mid level stim during therapeutic exercises in supine noted above.  PATIENT EDUCATION: Education details: Saebostim One Person educated: Patient Education method: Programmer, multimedia, Demonstration, and Verbal cues Education comprehension: verbalized understanding, verbal cues required, and needs further education  HOME EXERCISE PROGRAM: 04/26/24:  HEP through [employer]: -AAROM at the tabletop for internal, and external rotation. -Shoulder flexion/extension AAROM with dowel/cane base blocked at the wall -Shoulder circles AAROM with the dowel/cane into wall- with base blacked at the wall -Elbow flexion, extension extension AAROM with dowel/cane into wall with base of dowel/cane supported at the wall.  04/20/24:-LUE AAROM/AROM to tolerance and within cervical precautions; shoulder shrugs and retraction in front of mirror, theraputty 04/15/24: -positioning for comfort 04/13/24: -Bilateral AAROM Shoulder flexion and abduction tabletop exercises while seated. -AROM, and resistive forearm supination sitting with the LUE supported on pillows. -Supine left elbow flexion in gravity eliminated plane, progressing to partial gravity with resistance. 04/08/24: Refer to treatment note for HEP details.  GOALS: Goals reviewed with patient? Yes  SHORT TERM GOALS: Target date: 05/18/24  Pt will be indep to  perform HEP for improving BUE strength/flexibility/coordination for daily tasks. Baseline: Eval: Not yet initiated. 05/13/24: performs HEP with handout Goal status:  Achieved  LONG TERM GOALS: Target date: 06/29/24  Pt will increase L grip strength by 20 lbs or more in order to improve ability to grasp and carry heavier ADL supplies.  Baseline: Eval: L grip 34 lbs (R 68 lbs). 05/13/24: L grip 55 lbs Goal status: Achieved  2.  Pt will increase L lateral pinch strength by 3 or more lbs to maintain grasp of fork for independence with cutting food. Baseline: Eval: L 7 lbs (R 13 lbs). 05/13/24: L 12 lbs Goal status: Achieved  3.  Pt will increase R shoulder and LUE strength by 1 MM grade or more to better engage BUEs into ADLs.  Baseline: Eval: See above MMT; pt must use R hand to lift LUE up from lap to table top; L shoulder mobility BFL, R limited below 90*.  Pt predominately performing ADLs with R dominant arm. 1/85/74: Achieved with R hand improving as seen above, L wrist achieved, L shoulder remains the same.  Goal status: Ongoing  4.  Pt will increase L hand FMC skills to manage clothing fasteners independently and manipulate small ADL supplies with reduced dropping.  Baseline: Eval: L 9 hole peg test 47 sec (R 24 sec). 05/13/24: L 47 sec.  Goal status: Ongoing  5.  Pt will tolerate manual therapy, therapeutic modalities, and exercises to decrease pain in bilat shoulders/LUE to a reported 5/10 pain or less with activity.  Baseline: Eval: up to 10/10 pain with activity. 05/13/24: 1/20 at rest, 5/10 with activity.  Goal status: Achieved  ASSESSMENT: CLINICAL IMPRESSION: Pt reports a lot of fatigue this week with return to work, but no increase in pain.  Good tolerance to therapeutic exercises noted above, with pt tolerating up to moderate resistance on UBE in forward rotation, min resistance with reverse rotation.  Saebostim One not effective to elicit deltoid contraction, but noted to promote  increased biceps contraction.  Pt able to tolerate low stim on biceps during above noted supine exercises.  Noted improvement in L forearm supinators, with pt requiring less assist from OT to maintain supination during bicep curls against gravity.  Pt. continues to benefit from skilled OT to address above noted deficits and work towards above noted goals in OT poc in order to maximize indep with daily tasks.   PERFORMANCE DEFICITS: in functional skills including ADLs, IADLs, coordination, dexterity, sensation, ROM, strength, pain, flexibility, Fine motor control, Gross motor control, mobility, balance, endurance, decreased knowledge of precautions, decreased knowledge of use of DME, skin integrity, and UE functional use, cognitive skills including emotional, and psychosocial skills including coping strategies, environmental adaptation, habits, and routines and behaviors.   IMPAIRMENTS: are limiting patient from ADLs, IADLs, rest and sleep, work, leisure, and social participation.   CO-MORBIDITIES: has co-morbidities such as anxiety/depression, HTN, asthma, DM2, DDD (lumbar) that affects occupational performance. Patient will benefit from skilled OT to address above impairments and improve overall function.  MODIFICATION OR ASSISTANCE TO COMPLETE EVALUATION: Min-Moderate modification of tasks or assist with assess necessary to complete an evaluation.  OT OCCUPATIONAL PROFILE AND HISTORY: Detailed assessment: Review of records and additional review of physical, cognitive, psychosocial history related to current functional performance.  CLINICAL DECISION MAKING: Moderate - several treatment options, min-mod task modification necessary  REHAB POTENTIAL: Good  EVALUATION COMPLEXITY: Moderate  PLAN:  OT FREQUENCY: 2x/week  OT DURATION: 12 weeks  PLANNED INTERVENTIONS: 97168 OT Re-evaluation, 97535 self care/ADL training, 02889 therapeutic exercise, 97530 therapeutic activity, 97112 neuromuscular  re-education, 97140 manual therapy, 97116 gait training,  97010 moist heat, 97010 cryotherapy, 97034 contrast bath, 97750 Physical Performance Testing, 02239 Orthotic Initial, [id number] Orthotic/Prosthetic subsequent, passive range of motion, balance training, functional mobility training, psychosocial skills training, energy conservation, coping strategies training, patient/family education, and DME and/or AE instructions  RECOMMENDED OTHER SERVICES: None at this time  CONSULTED AND AGREED WITH PLAN OF CARE: Patient and family member/caregiver  PLAN FOR NEXT SESSION: see above  Inocente Blazing, MS, OTR/L  05/22/2024, 9:32 PM

## 2024-05-24 ENCOUNTER — Ambulatory Visit

## 2024-05-24 ENCOUNTER — Other Ambulatory Visit

## 2024-05-24 DIAGNOSIS — R278 Other lack of coordination: Secondary | ICD-10-CM

## 2024-05-24 DIAGNOSIS — M6281 Muscle weakness (generalized): Secondary | ICD-10-CM

## 2024-05-24 DIAGNOSIS — G959 Disease of spinal cord, unspecified: Secondary | ICD-10-CM

## 2024-05-25 ENCOUNTER — Encounter: Admitting: Occupational Therapy

## 2024-05-25 ENCOUNTER — Ambulatory Visit: Admitting: Physical Therapy

## 2024-05-26 ENCOUNTER — Encounter

## 2024-05-26 ENCOUNTER — Other Ambulatory Visit

## 2024-05-26 NOTE — Therapy (Signed)
 OUTPATIENT OCCUPATIONAL THERAPY NEURO TREATMENT NOTE  Patient Name: Eileen Chaney MRN: [id number] DOB:[birth year], 48 y.o., female Today's Date: 05/26/2024  PCP: Dr. Alm Needle REFERRING PROVIDER: Toribio Pitch, PA-C  END OF SESSION:  OT End of Session - 05/26/24 1317     Visit Number 12    Number of Visits 24    Date for OT Re-Evaluation 06/29/24    OT Start Time 1315    OT Stop Time 1400    OT Time Calculation (min) 45 min    Activity Tolerance Patient tolerated treatment well    Behavior During Therapy WFL for tasks assessed/performed         Past Medical History:  Diagnosis Date   Anxiety    Arthritis    Asthma    Cervical myelopathy with cervical radiculopathy (HCC)    Complication of anesthesia    with c sections had a had time getting her sedated   DDD (degenerative disc disease), cervical    Depression    Diabetes mellitus without complication (HCC)    type 2   Family history of adverse reaction to anesthesia    mother hard to wake up   Fibromyalgia    Hyperlipidemia    Hypertension    Idiopathic urticaria    Pneumonia    PONV (postoperative nausea and vomiting)    Undifferentiated inflammatory arthritis    Past Surgical History:  Procedure Laterality Date   ABDOMINAL HYSTERECTOMY  2018   uterine fibroids   ANKLE SURGERY Right 2022   ligament repair   ANTERIOR CERVICAL DECOMP/DISCECTOMY FUSION N/A 11/12/2021   Procedure: C3-5 ANTERIOR CERVICAL DISCECTOMY AND FUSION (GLOBUS HEDRON);  Surgeon: Clois Fret, MD;  Location: [facility];  Service: Neurosurgery;  Laterality: N/A;   CERVICAL WOUND DEBRIDEMENT N/A 01/20/2022   Procedure: posterior wound debridment;  Surgeon: Clois Fret, MD;  Location: [facility];  Service: Neurosurgery;  Laterality: N/A;   CESAREAN SECTION     X3 2000, 2001, 2009   DILATION AND CURETTAGE OF UTERUS     POSTERIOR CERVICAL FUSION/FORAMINOTOMY N/A 03/15/2024   Procedure: POSTERIOR CERVICAL FUSION/FORAMINOTOMY  LEVEL 4;  Surgeon: Clois Fret, MD;  Location: [facility];  Service: Neurosurgery;  Laterality: N/A;  C4-7 Posterior Spinal Decompression C4-7 Posterior Spinal Instrumentation C5-7 Fusion   POSTERIOR CERVICAL LAMINECTOMY N/A 01/07/2022   Procedure: OPEN C5-7 POSTERIOR DECOMPRESSION;  Surgeon: Clois Fret, MD;  Location: [facility];  Service: Neurosurgery;  Laterality: N/A;   TONSILLECTOMY  2000   WISDOM TOOTH EXTRACTION     Patient Active Problem List   Diagnosis Date Noted   Anxiety with depression 03/24/2024   Cervical myelopathy (HCC) 03/15/2024   Spinal stenosis in cervical region 03/15/2024   Cervical radiculopathy 03/15/2024   S/P cervical spinal fusion 03/15/2024   Wound infection after surgery 01/20/2022   Gestational diabetes 06/02/2020   Arthritis 10/11/2019   DDD (degenerative disc disease), lumbar 10/11/2019   Hyperlipidemia 10/11/2019   Idiopathic urticaria 10/11/2019   Recurrent major depressive disorder, in partial remission (HCC) 10/11/2019   Spinal stenosis of lumbar region without neurogenic claudication 10/11/2019   Type 2 diabetes mellitus without complication, with long-term current use of insulin  (HCC) 10/11/2019   Elevated lactic acid level 09/29/2017   Leukocytosis 09/29/2017   Diabetes (HCC) 09/28/2017   Hypertension 09/28/2017   Mild asthma without complication 09/28/2017   Morbid obesity (HCC) 09/28/2017   Thalassemia 09/28/2017   GAD (generalized anxiety disorder) 07/25/2014   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, unspecified  03/14/2014   ONSET DATE: 03/15/24  REFERRING DIAG:  G95.9 (ICD-10-CM) - Cervical myelopathy (HCC)  Z98.1 (ICD-10-CM) - S/P cervical spinal fusion   THERAPY DIAG:  Muscle weakness (generalized)  Other lack of coordination  Cervical myelopathy (HCC)  Rationale for Evaluation and Treatment: Rehabilitation  SUBJECTIVE:  SUBJECTIVE STATEMENT: Pt reports RUE is coming along, but can still feel  weakness when lifting the arm up to a particular spot (~90*). Pt accompanied by: self  PERTINENT HISTORY: Per chart: CIR d/c summary: Eileen Chaney is a 48 y.o. right-handed female with history significant for anxiety/depression as well as component of ADHD maintained on Ritalin , asthma, class I obesity with BMI 35.28 diabetes mellitus fibromyalgia hyperlipidemia hypertension posterior cervical laminectomy 01/07/2022, anterior cervical decompressive discectomy fusion 11/12/2021 complicated by cervical wound debridement 01/20/2022.  Per chart review patient lives with daughter.  She has good support from her family.  Two-level home bed and bath on main level independent community ambulator.  Presented to [facility] 03/15/2024 with increasing difficulty with dexterity in her hands as well as poor balance.  She was having trouble using her phone as well as difficulty with buttons and clasping her necklaces.  She continued to have ongoing neck and left arm and shoulder pain with numbness as well as low back pain that extends into her buttocks.  She had denied any recent falls.  Recent imaging showed severe central canal stenosis at C4-5 C5-6 with foraminal stenosis from C3-7.  Most recent preoperative labs showed a hemoglobin of 12.8 hematocrit 40.3 on 03/03/2024 and unremarkable chemistries.  Underwent posterior segmental instrumentation of C3-7 as well as posterior lateral arthrodesis from C5-7 with cervical laminectomy from C4-C6 for decompression of the spinal cord with bilateral foraminotomies and facetectomies at C3/4 C4-5 C5-6 and left foraminotomies C6-7 03/15/2024 per Dr. Reeves Nine.  Laced in a cervical brace for comfort.  She did complete a course of Decadron  taper.  Patient control with pain with the use of Dilaudid  as well as Valium .  Lovenox  added for DVT prophylaxis 03/16/2024.  Follow-up imaging CT/MRI cervical spine 03/16/2024 due to complaints of increasing neck pain and acute left arm weakness showed  noted degenerative changes moderate to severe spinal canal stenosis C3-4 with mild cord compression without definitive cord signal abnormality.  Multilevel foraminal stenosis greatest and severe bilaterally at C3-4 C5-6 on the left at C6-7.  Moderate to severe spinal canal stenosis on the right at C6-7.  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  Therapy evaluations completed due to patient decreased functional mobility was admitted for a comprehensive rehab program.   PRECAUTIONS: Cervical,   WEIGHT BEARING RESTRICTIONS: Yes ; no lifting >10 lbs  PAIN: 05/24/24: L shoulder 2/10  05/20/24: L shoulder 1-2/10 05/13/24: L shoulder 1/10 05/11/24: 2-4/10 pain L shoulder  7/18/225: 4-5/10 left shoulder following PT.  04/20/24: 2/10 L shoulder, 7-8/10 pain at times with activity 04/15/24: 2/10 pain  in the LUE in supine described as sharp nerve pain. 04/13/24: 3/10 pain in the left shoulder during standing, and pain in supine at approximately 45 degrees of ROM 04/08/24: 3/10 at rest, 8-10 with activity Are you having pain? Yes: NPRS scale: 4/10 pain at rest, 8/10 with activity Pain location: neck, shoulders down to hands Pain description: nerve pain Aggravating factors: walking, sitting, standing, moving L arm  Relieving factors: reclining  FALLS: Has patient fallen  in last 6 months? 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   OBJECTIVE:  Note: Objective measures were completed at Evaluation unless otherwise noted.  HAND DOMINANCE: Right  ADLs: Overall ADLs: Caregiver assist for ADLs Transfers/ambulation related to ADLs: using rollator at home for balance and to take pressure off shoulders and back Eating: difficulty cutting food Grooming: Max A with hair care d/t BUE weakness UB Dressing: Limited engagement of LUE LB Dressing: Difficulty hiking pants d/t limited engagement of the LUE Toileting: modified indep using R dominant/less affected arm Bathing: supv for tub/shower transfers; assist to wash upper R side of body; reports her long handled sponge is ineffective Tub Shower transfers: transfer tub bench in place but able to step over shower with supv and sits on bench prn throughout shower Equipment: Transfer tub bench  IADLs: Shopping: dep Light housekeeping: Max A; pt reports she can load washing machine but is unable to load/unload dishwasher d/t the bending required Meal Prep: Partner managing meals Community mobility: increased pain/limited Medication management: indep Financial management: indep  POSTURE COMMENTS:  rounded shoulders and forward head  ACTIVITY TOLERANCE: Activity tolerance: Limited tolerance for sitting and standing; more comfortable in supine (performed eval partially supine on mat to manage pain)  UPPER EXTREMITY ROM:    Active ROM Right eval Left eval Left 05/11/24  Shoulder flexion 55 30 45  Shoulder abduction 55 20 20  Shoulder adduction     Shoulder extension     Shoulder internal rotation WNL Unable to attempt behind back Pt crawls fingers around hip and buttocks  Shoulder external rotation -20 Unable to attempt behind head Unable to attempt behind head against gravity  Elbow flexion     Elbow extension     Wrist flexion     Wrist extension     Wrist ulnar  deviation     Wrist radial deviation     Wrist pronation     Wrist supination     (Blank rows = not tested)  UPPER EXTREMITY MMT:     MMT Right eval R 05/13/24 Left eval L 05/13/24  Shoulder flexion 2+ 3+/5 2- 2-/5  Shoulder abduction 2+ 2+/5 2- 2-/5  Shoulder adduction      Shoulder extension      Shoulder internal rotation 4 4 2+ 3-/5  Shoulder external rotation 3- 3+ 2 2/5  Middle trapezius      Lower trapezius      Elbow flexion 4+ 5 1 to 2- (will assess more thoroughly next session) 2-/5  Elbow extension 5  4 4+/5  Wrist flexion 5  4 5   Wrist extension 5  4 5   Wrist ulnar deviation      Wrist radial deviation      Wrist pronation 5  3+ 5/5  Wrist supination 5  3- 4/5  (Blank rows = not tested)  HAND FUNCTION: Grip strength: Right: 68 lbs; Left: 34 lbs, Lateral pinch: Right:  13 lbs, Left: 7 lbs, and 3 point pinch: Right: 8 lbs, Left: 5 lbs 05/13/24: Grip strength: Right: 67 lbs; Left: 55 lbs, Lateral pinch: Right: 16 lbs, Left: 12 lbs, and 3 point pinch: Right: 13 lbs, Left: 13 lbs  COORDINATION: 9 Hole Peg test: Right: 24 sec; Left: 47 sec  05/23/24: 9 Hole Peg test: Right: 25 sec; Left: 47 sec  SENSATION: tingling/numbness in L hand digits 1-3, diminished up the L arm, no tingling in the RUE  COGNITION: Overall cognitive status: Within functional limits for tasks assessed  VISION: WNL  OBSERVATIONS:  Pt visibly in pain in with facial grimacing during shoulder shrugs  NEXT MD APPT: Return to neuro surgeon Sept.                                                                                                                            TREATMENT DATE: 05/24/24  Therapeutic Exercise: -Participation in L shoulder stability exercises with place and hold in 90* shoulder flex, elbow fully extended; min perturbations given in all directions  -Participation in L shoulder stability exercises with place and hold with humerus placed in 45* abd, elbow flexed to 90*, min  perturbations given to challenge ER/IR -Participation in L elbow stability exercises with place and hold in 90* elbow flex, min perturbations given to challenge flex/ext -AAROM for L elbow flex, min resistance provided for elbow ext, AAROM forearm pron/sup, L shoulder IR min resistance, AAROM ER with humerus abd to 45* x2 sets 10 reps each -A/AAROM for R shoulder in supine:  AAROM shoulder flex, min resistance provided for horiz abd/add, active assisted abd, min resistance provided for add -In supine: yellow Tband for R elbow flex/ext, R shoulder forward flexion, R shoulder abd, R shoulder ER x10 reps, mod vc and demo for form/technique to reduce substitution patterns; transitioned to sitting for 2nd set; min vc to follow visual handout of above noted Tband exercises.   Self Care: -Review of visual handout for HEP additions -Education provided on minimizing ROM arc to reduce substitution patterns when performing BUE strengthening -Education provided on pros/cons of a postural brace, per pt inquiry.  OT advised against use and recommended core strengthening and scapular retractions for improving erect sitting posture.   PATIENT EDUCATION: Education details: HEP, Marketing executive  Person educated: Patient Education method: Explanation, Verbal cues, and Handouts Education comprehension: verbalized understanding, verbal cues required, and needs further education  HOME EXERCISE PROGRAM: 04/26/24:  HEP through [employer]: -AAROM at the tabletop for internal, and external rotation. -Shoulder flexion/extension AAROM with dowel/cane base blocked at the wall -Shoulder circles AAROM with the dowel/cane into wall- with base blacked at the wall -Elbow flexion, extension extension AAROM with dowel/cane into wall with base of dowel/cane supported at the wall.  04/20/24:-LUE AAROM/AROM to tolerance and within cervical precautions; shoulder shrugs and retraction in front of mirror, theraputty 04/15/24:  -positioning for comfort 04/13/24: -Bilateral AAROM Shoulder flexion and abduction tabletop exercises while seated. -  AROM, and resistive forearm supination sitting with the LUE supported on pillows. -Supine left elbow flexion in gravity eliminated plane, progressing to partial gravity with resistance. 04/08/24: Refer to treatment note for HEP details.  GOALS: Goals reviewed with patient? Yes  SHORT TERM GOALS: Target date: 05/18/24  Pt will be indep to perform HEP for improving BUE strength/flexibility/coordination for daily tasks. Baseline: Eval: Not yet initiated. 05/13/24: performs HEP with handout Goal status: Achieved  LONG TERM GOALS: Target date: 06/29/24  Pt will increase L grip strength by 20 lbs or more in order to improve ability to grasp and carry heavier ADL supplies.  Baseline: Eval: L grip 34 lbs (R 68 lbs). 05/13/24: L grip 55 lbs Goal status: Achieved  2.  Pt will increase L lateral pinch strength by 3 or more lbs to maintain grasp of fork for independence with cutting food. Baseline: Eval: L 7 lbs (R 13 lbs). 05/13/24: L 12 lbs Goal status: Achieved  3.  Pt will increase R shoulder and LUE strength by 1 MM grade or more to better engage BUEs into ADLs.  Baseline: Eval: See above MMT; pt must use R hand to lift LUE up from lap to table top; L shoulder mobility BFL, R limited below 90*.  Pt predominately performing ADLs with R dominant arm. 1/85/74: Achieved with R hand improving as seen above, L wrist achieved, L shoulder remains the same.  Goal status: Ongoing  4.  Pt will increase L hand [facility]MC skills to manage clothing fasteners independently and manipulate small ADL supplies with reduced dropping.  Baseline: Eval: L 9 hole peg test 47 sec (R 24 sec). 05/13/24: L 47 sec.  Goal status: Ongoing  5.  Pt will tolerate manual therapy, therapeutic modalities, and exercises to decrease pain in bilat shoulders/LUE to a reported 5/10 pain or less with activity.  Baseline: Eval: up  to 10/10 pain with activity. 05/13/24: 1/20 at rest, 5/10 with activity.  Goal status: Achieved  ASSESSMENT: CLINICAL IMPRESSION: HEP progressed today with good tolerance, with pt performing supine LUE A/AAROM, L shoulder/elbow stability exercises in supine, and R shoulder strengthening with yellow Tband in supine and sitting.  Pt required mod vc for form/technique to reduce substitution patterns; will continue to review.  Pt. continues to benefit from skilled OT to address above noted deficits and work towards above noted goals in OT poc in order to maximize indep with daily tasks.   PERFORMANCE DEFICITS: in functional skills including ADLs, IADLs, coordination, dexterity, sensation, ROM, strength, pain, flexibility, Fine motor control, Gross motor control, mobility, balance, endurance, decreased knowledge of precautions, decreased knowledge of use of DME, skin integrity, and UE functional use, cognitive skills including emotional, and psychosocial skills including coping strategies, environmental adaptation, habits, and routines and behaviors.   IMPAIRMENTS: are limiting patient from ADLs, IADLs, rest and sleep, work, leisure, and social participation.   CO-MORBIDITIES: has co-morbidities such as anxiety/depression, HTN, asthma, DM2, DDD (lumbar) that affects occupational performance. Patient will benefit from skilled OT to address above impairments and improve overall function.  MODIFICATION OR ASSISTANCE TO COMPLETE EVALUATION: Min-Moderate modification of tasks or assist with assess necessary to complete an evaluation.  OT OCCUPATIONAL PROFILE AND HISTORY: Detailed assessment: Review of records and additional review of physical, cognitive, psychosocial history related to current functional performance.  CLINICAL DECISION MAKING: Moderate - several treatment options, min-mod task modification necessary  REHAB POTENTIAL: Good  EVALUATION COMPLEXITY: Moderate  PLAN:  OT FREQUENCY:  2x/week  OT DURATION: 12 weeks  PLANNED INTERVENTIONS: 97168 OT Re-evaluation, 97535 self care/ADL training, 02889 therapeutic exercise, 97530 therapeutic activity, 97112 neuromuscular re-education, 97140 manual therapy, 97116 gait training, 02989 moist heat, 97010 cryotherapy, 97034 contrast bath, 97750 Physical Performance Testing, 02239 Orthotic Initial, 97763 Orthotic/Prosthetic subsequent, passive range of motion, balance training, functional mobility training, psychosocial skills training, energy conservation, coping strategies training, patient/family education, and DME and/or AE instructions  RECOMMENDED OTHER SERVICES: None at this time  CONSULTED AND AGREED WITH PLAN OF CARE: Patient and family member/caregiver  PLAN FOR NEXT SESSION: see above  Inocente Blazing, MS, OTR/L  05/26/2024, 1:21 PM

## 2024-05-27 ENCOUNTER — Ambulatory Visit: Admitting: Physical Therapy

## 2024-05-27 ENCOUNTER — Other Ambulatory Visit: Payer: Self-pay

## 2024-05-27 ENCOUNTER — Encounter: Admitting: Occupational Therapy

## 2024-05-27 ENCOUNTER — Ambulatory Visit

## 2024-05-27 ENCOUNTER — Other Ambulatory Visit
Admission: RE | Admit: 2024-05-27 | Discharge: 2024-05-27 | Disposition: A | Discharge status: Home / Self Care | Payer: Self-pay | Source: Ambulatory Visit | Attending: Medical Genetics | Admitting: Medical Genetics

## 2024-05-27 DIAGNOSIS — M6281 Muscle weakness (generalized): Secondary | ICD-10-CM

## 2024-05-27 DIAGNOSIS — R278 Other lack of coordination: Secondary | ICD-10-CM

## 2024-05-27 DIAGNOSIS — G959 Disease of spinal cord, unspecified: Secondary | ICD-10-CM

## 2024-05-27 DIAGNOSIS — M4802 Spinal stenosis, cervical region: Secondary | ICD-10-CM

## 2024-05-29 NOTE — Therapy (Signed)
 OUTPATIENT OCCUPATIONAL THERAPY NEURO TREATMENT NOTE  Patient Name: Eileen Chaney MRN: [id number] DOB:1975/10/19, 48 y.o., female Today's Date: 05/29/2024  PCP: Dr. Alm Needle REFERRING PROVIDER: Toribio Pitch, PA-C  END OF SESSION:  OT End of Session - 05/29/24 1539     Visit Number 13    Number of Visits 24    Date for OT Re-Evaluation 06/29/24    OT Start Time 1453    OT Stop Time 1536    OT Time Calculation (min) 43 min    Activity Tolerance Patient tolerated treatment well    Behavior During Therapy WFL for tasks assessed/performed         Past Medical History:  Diagnosis Date   Anxiety    Arthritis    Asthma    Cervical myelopathy with cervical radiculopathy (HCC)    Complication of anesthesia    with c sections had a had time getting her sedated   DDD (degenerative disc disease), cervical    Depression    Diabetes mellitus without complication (HCC)    type 2   Family history of adverse reaction to anesthesia    mother hard to wake up   Fibromyalgia    Hyperlipidemia    Hypertension    Idiopathic urticaria    Pneumonia    PONV (postoperative nausea and vomiting)    Undifferentiated inflammatory arthritis    Past Surgical History:  Procedure Laterality Date   ABDOMINAL HYSTERECTOMY  2018   uterine fibroids   ANKLE SURGERY Right 2022   ligament repair   ANTERIOR CERVICAL DECOMP/DISCECTOMY FUSION N/A 11/12/2021   Procedure: C3-5 ANTERIOR CERVICAL DISCECTOMY AND FUSION (GLOBUS HEDRON);  Surgeon: Clois Fret, MD;  Location: [facility];  Service: Neurosurgery;  Laterality: N/A;   CERVICAL WOUND DEBRIDEMENT N/A 01/20/2022   Procedure: posterior wound debridment;  Surgeon: Clois Fret, MD;  Location: [facility];  Service: Neurosurgery;  Laterality: N/A;   CESAREAN SECTION     X3 2000, 2001, 2009   DILATION AND CURETTAGE OF UTERUS     POSTERIOR CERVICAL FUSION/FORAMINOTOMY N/A 03/15/2024   Procedure: POSTERIOR CERVICAL FUSION/FORAMINOTOMY  LEVEL 4;  Surgeon: Clois Fret, MD;  Location: [facility];  Service: Neurosurgery;  Laterality: N/A;  C4-7 Posterior Spinal Decompression C4-7 Posterior Spinal Instrumentation C5-7 Fusion   POSTERIOR CERVICAL LAMINECTOMY N/A 01/07/2022   Procedure: OPEN C5-7 POSTERIOR DECOMPRESSION;  Surgeon: Clois Fret, MD;  Location: [facility];  Service: Neurosurgery;  Laterality: N/A;   TONSILLECTOMY  2000   WISDOM TOOTH EXTRACTION     Patient Active Problem List   Diagnosis Date Noted   Anxiety with depression 03/24/2024   Cervical myelopathy (HCC) 03/15/2024   Spinal stenosis in cervical region 03/15/2024   Cervical radiculopathy 03/15/2024   S/P cervical spinal fusion 03/15/2024   Wound infection after surgery 01/20/2022   Gestational diabetes 06/02/2020   Arthritis 10/11/2019   DDD (degenerative disc disease), lumbar 10/11/2019   Hyperlipidemia 10/11/2019   Idiopathic urticaria 10/11/2019   Recurrent major depressive disorder, in partial remission (HCC) 10/11/2019   Spinal stenosis of lumbar region without neurogenic claudication 10/11/2019   Type 2 diabetes mellitus without complication, with long-term current use of insulin  (HCC) 10/11/2019   Elevated lactic acid level 09/29/2017   Leukocytosis 09/29/2017   Diabetes (HCC) 09/28/2017   Hypertension 09/28/2017   Mild asthma without complication 09/28/2017   Morbid obesity (HCC) 09/28/2017   Thalassemia 09/28/2017   GAD (generalized anxiety disorder) 07/25/2014   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, unspecified  03/14/2014   ONSET DATE: 03/15/24  REFERRING DIAG:  G95.9 (ICD-10-CM) - Cervical myelopathy (HCC)  Z98.1 (ICD-10-CM) - S/P cervical spinal fusion   THERAPY DIAG:  Muscle weakness (generalized)  Other lack of coordination  Cervical myelopathy (HCC)  Rationale for Evaluation and Treatment: Rehabilitation  SUBJECTIVE:  SUBJECTIVE STATEMENT: Pt reports needing to improve her morning routine, as it  is exhausting to don her clothing and put up her hair.  Pt accompanied by: self  PERTINENT HISTORY: Per chart: CIR d/c summary: Eileen Chaney is a 48 y.o. right-handed female with history significant for anxiety/depression as well as component of ADHD maintained on Ritalin , asthma, class I obesity with BMI 35.28 diabetes mellitus fibromyalgia hyperlipidemia hypertension posterior cervical laminectomy 01/07/2022, anterior cervical decompressive discectomy fusion 11/12/2021 complicated by cervical wound debridement 01/20/2022.  Per chart review patient lives with daughter.  She has good support from her family.  Two-level home bed and bath on main level independent community ambulator.  Presented to [facility] 03/15/2024 with increasing difficulty with dexterity in her hands as well as poor balance.  She was having trouble using her phone as well as difficulty with buttons and clasping her necklaces.  She continued to have ongoing neck and left arm and shoulder pain with numbness as well as low back pain that extends into her buttocks.  She had denied any recent falls.  Recent imaging showed severe central canal stenosis at C4-5 C5-6 with foraminal stenosis from C3-7.  Most recent preoperative labs showed a hemoglobin of 12.8 hematocrit 40.3 on 03/03/2024 and unremarkable chemistries.  Underwent posterior segmental instrumentation of C3-7 as well as posterior lateral arthrodesis from C5-7 with cervical laminectomy from C4-C6 for decompression of the spinal cord with bilateral foraminotomies and facetectomies at C3/4 C4-5 C5-6 and left foraminotomies C6-7 03/15/2024 per Dr. Reeves Nine.  Laced in a cervical brace for comfort.  She did complete a course of Decadron  taper.  Patient control with pain with the use of Dilaudid  as well as Valium .  Lovenox  added for DVT prophylaxis 03/16/2024.  Follow-up imaging CT/MRI cervical spine 03/16/2024 due to complaints of increasing neck pain and acute left arm weakness showed noted  degenerative changes moderate to severe spinal canal stenosis C3-4 with mild cord compression without definitive cord signal abnormality.  Multilevel foraminal stenosis greatest and severe bilaterally at C3-4 C5-6 on the left at C6-7.  Moderate to severe spinal canal stenosis on the right at C6-7.  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  Therapy evaluations completed due to patient decreased functional mobility was admitted for a comprehensive rehab program.   PRECAUTIONS: Cervical,   WEIGHT BEARING RESTRICTIONS: Yes ; no lifting >10 lbs  PAIN: 05/27/24: 3-4/10 wide spread Are you having pain? Yes: NPRS scale: 4/10 pain at rest, 8/10 with activity Pain location: neck, shoulders down to hands Pain description: nerve pain Aggravating factors: walking, sitting, standing, moving L arm  Relieving factors: reclining  FALLS: Has patient fallen in last 6 months? 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   OBJECTIVE:  Note: Objective measures were completed at Evaluation unless otherwise noted.  HAND DOMINANCE: Right  ADLs: Overall ADLs: Caregiver assist for ADLs Transfers/ambulation related to ADLs: using rollator at home for balance and to take pressure off shoulders and back Eating: difficulty cutting food Grooming:  Max A with hair care d/t BUE weakness UB Dressing: Limited engagement of LUE LB Dressing: Difficulty hiking pants d/t limited engagement of the LUE Toileting: modified indep using R dominant/less affected arm Bathing: supv for tub/shower transfers; assist to wash upper R side of body; reports her long handled sponge is ineffective Tub Shower transfers: transfer tub bench in place but able to step over shower with supv and sits on bench prn throughout shower Equipment: Transfer tub bench  IADLs: Shopping: dep Light housekeeping: Max A; pt reports she can load washing machine but is unable to load/unload dishwasher d/t the bending required Meal Prep: Partner managing meals Community mobility: increased pain/limited Medication management: indep Financial management: indep  POSTURE COMMENTS:  rounded shoulders and forward head  ACTIVITY TOLERANCE: Activity tolerance: Limited tolerance for sitting and standing; more comfortable in supine (performed eval partially supine on mat to manage pain)  UPPER EXTREMITY ROM:    Active ROM Right eval Left eval Left 05/11/24  Shoulder flexion 55 30 45  Shoulder abduction 55 20 20  Shoulder adduction     Shoulder extension     Shoulder internal rotation WNL Unable to attempt behind back Pt crawls fingers around hip and buttocks  Shoulder external rotation -20 Unable to attempt behind head Unable to attempt behind head against gravity  Elbow flexion     Elbow extension     Wrist flexion     Wrist extension     Wrist ulnar deviation     Wrist radial deviation     Wrist pronation     Wrist supination     (Blank rows = not tested)  UPPER EXTREMITY MMT:     MMT Right eval R 05/13/24 Left eval L 05/13/24  Shoulder flexion 2+ 3+/5 2- 2-/5  Shoulder abduction 2+ 2+/5 2- 2-/5  Shoulder adduction      Shoulder extension      Shoulder internal rotation 4 4 2+ 3-/5  Shoulder external rotation 3- 3+ 2 2/5  Middle trapezius      Lower trapezius       Elbow flexion 4+ 5 1 to 2- (will assess more thoroughly next session) 2-/5  Elbow extension 5  4 4+/5  Wrist flexion 5  4 5   Wrist extension 5  4 5   Wrist ulnar deviation      Wrist radial deviation      Wrist pronation 5  3+ 5/5  Wrist supination 5  3- 4/5  (Blank rows = not tested)  HAND FUNCTION: Grip strength: Right: 68 lbs; Left: 34 lbs, Lateral pinch: Right: 13 lbs, Left: 7 lbs, and 3 point pinch: Right: 8 lbs, Left: 5 lbs 05/13/24: Grip strength: Right: 67 lbs; Left: 55 lbs, Lateral pinch: Right: 16 lbs, Left: 12 lbs, and 3 point pinch: Right: 13 lbs, Left: 13 lbs  COORDINATION: 9 Hole Peg test: Right: 24 sec; Left: 47 sec  05/23/24: 9 Hole Peg test: Right: 25 sec; Left: 47 sec  SENSATION: tingling/numbness in L  hand digits 1-3, diminished up the L arm, no tingling in the RUE  COGNITION: Overall cognitive status: Within functional limits for tasks assessed  VISION: WNL  OBSERVATIONS:  Pt visibly in pain in with facial grimacing during shoulder shrugs  NEXT MD APPT: Return to neuro surgeon Sept.                                                                                                                            TREATMENT DATE: 05/27/24  Therapeutic Exercise: -Participation in L shoulder stability exercises with place and hold in 90* shoulder flex, elbow fully extended; min perturbations given in all directions  -AAROM for L elbow flex, min resistance provided for elbow ext, L shoulder IR min resistance, AAROM ER with humerus abd to 45* x2 sets 10 reps each -A/AAROM for R shoulder in supine:  AAROM shoulder flex, min resistance provided for horiz abd/add, active assisted abd, min resistance provided for add  Self Care: -AE instruction: dressing stick with options in length, long handled sponge, flexible or stiff; advised on options to obtain -Practiced donning/doffing strategies with shirt and dressing stick  -Practiced positioning strategies for hair care, with L  arm on table top vs L arm propped on shelf allowing shoulder flexion >90*, and wedging pillow or towel between upper arm and head to further promote L shoulder ER to allow easier reach to back of head to tie hair up in bun.  PATIENT EDUCATION: Education details: ADL/AE training  Person educated: Patient Education method: Explanation and Verbal cues Education comprehension: verbalized understanding, verbal cues required, and needs further education  HOME EXERCISE PROGRAM: 04/26/24:  HEP through [employer]: -AAROM at the tabletop for internal, and external rotation. -Shoulder flexion/extension AAROM with dowel/cane base blocked at the wall -Shoulder circles AAROM with the dowel/cane into wall- with base blacked at the wall -Elbow flexion, extension extension AAROM with dowel/cane into wall with base of dowel/cane supported at the wall.  04/20/24:-LUE AAROM/AROM to tolerance and within cervical precautions; shoulder shrugs and retraction in front of mirror, theraputty 04/15/24: -positioning for comfort 04/13/24: -Bilateral AAROM Shoulder flexion and abduction tabletop exercises while seated. -AROM, and resistive forearm supination sitting with the LUE supported on pillows. -Supine left elbow flexion in gravity eliminated plane, progressing to partial gravity with resistance. 04/08/24: Refer to treatment note for HEP details.  GOALS: Goals reviewed with patient? Yes  SHORT TERM GOALS: Target date: 05/18/24  Pt will be indep to perform HEP for improving BUE strength/flexibility/coordination for daily tasks. Baseline: Eval: Not yet initiated. 05/13/24: performs HEP with handout Goal status: Achieved  LONG TERM GOALS: Target date: 06/29/24  Pt will increase L grip strength by 20 lbs or more in order to improve ability to grasp and carry heavier ADL supplies.  Baseline: Eval: L grip 34 lbs (R 68 lbs). 05/13/24: L grip 55 lbs Goal status: Achieved  2.  Pt will increase L lateral pinch strength by 3  or more lbs  to maintain grasp of fork for independence with cutting food. Baseline: Eval: L 7 lbs (R 13 lbs). 05/13/24: L 12 lbs Goal status: Achieved  3.  Pt will increase R shoulder and LUE strength by 1 MM grade or more to better engage BUEs into ADLs.  Baseline: Eval: See above MMT; pt must use R hand to lift LUE up from lap to table top; L shoulder mobility BFL, R limited below 90*.  Pt predominately performing ADLs with R dominant arm. 1/85/74: Achieved with R hand improving as seen above, L wrist achieved, L shoulder remains the same.  Goal status: Ongoing  4.  Pt will increase L hand FMC skills to manage clothing fasteners independently and manipulate small ADL supplies with reduced dropping.  Baseline: Eval: L 9 hole peg test 47 sec (R 24 sec). 05/13/24: L 47 sec.  Goal status: Ongoing  5.  Pt will tolerate manual therapy, therapeutic modalities, and exercises to decrease pain in bilat shoulders/LUE to a reported 5/10 pain or less with activity.  Baseline: Eval: up to 10/10 pain with activity. 05/13/24: 1/20 at rest, 5/10 with activity.  Goal status: Achieved  ASSESSMENT: CLINICAL IMPRESSION: Pt reports exhaustion with her morning routine d/t BUE weakness and ROM limitations.  Pt found dressing stick to be effective for donning/doffing shirt, and positioning strategies effective for supporting LUE to reach behind head to put her hair up in a bun.  Pt eager to practice these strategies at home, and plans to obtain dressing stick.  Pt. continues to benefit from skilled OT to address above noted deficits and work towards above noted goals in OT poc in order to maximize indep with daily tasks.   PERFORMANCE DEFICITS: in functional skills including ADLs, IADLs, coordination, dexterity, sensation, ROM, strength, pain, flexibility, Fine motor control, Gross motor control, mobility, balance, endurance, decreased knowledge of precautions, decreased knowledge of use of DME, skin integrity, and UE  functional use, cognitive skills including emotional, and psychosocial skills including coping strategies, environmental adaptation, habits, and routines and behaviors.   IMPAIRMENTS: are limiting patient from ADLs, IADLs, rest and sleep, work, leisure, and social participation.   CO-MORBIDITIES: has co-morbidities such as anxiety/depression, HTN, asthma, DM2, DDD (lumbar) that affects occupational performance. Patient will benefit from skilled OT to address above impairments and improve overall function.  MODIFICATION OR ASSISTANCE TO COMPLETE EVALUATION: Min-Moderate modification of tasks or assist with assess necessary to complete an evaluation.  OT OCCUPATIONAL PROFILE AND HISTORY: Detailed assessment: Review of records and additional review of physical, cognitive, psychosocial history related to current functional performance.  CLINICAL DECISION MAKING: Moderate - several treatment options, min-mod task modification necessary  REHAB POTENTIAL: Good  EVALUATION COMPLEXITY: Moderate  PLAN:  OT FREQUENCY: 2x/week  OT DURATION: 12 weeks  PLANNED INTERVENTIONS: 97168 OT Re-evaluation, 97535 self care/ADL training, 02889 therapeutic exercise, 97530 therapeutic activity, 97112 neuromuscular re-education, 97140 manual therapy, 97116 gait training, 02989 moist heat, 97010 cryotherapy, 97034 contrast bath, 97750 Physical Performance Testing, 02239 Orthotic Initial, 97763 Orthotic/Prosthetic subsequent, passive range of motion, balance training, functional mobility training, psychosocial skills training, energy conservation, coping strategies training, patient/family education, and DME and/or AE instructions  RECOMMENDED OTHER SERVICES: None at this time  CONSULTED AND AGREED WITH PLAN OF CARE: Patient and family member/caregiver  PLAN FOR NEXT SESSION: see above  Inocente Blazing, MS, OTR/L  05/29/2024, 3:41 PM

## 2024-06-01 ENCOUNTER — Ambulatory Visit (INDEPENDENT_AMBULATORY_CARE_PROVIDER_SITE_OTHER): Admitting: Neurosurgery

## 2024-06-01 ENCOUNTER — Encounter: Payer: Self-pay | Admitting: Neurosurgery

## 2024-06-01 ENCOUNTER — Ambulatory Visit
Admission: RE | Admit: 2024-06-01 | Discharge: 2024-06-01 | Disposition: A | Discharge status: Home / Self Care | Source: Ambulatory Visit | Attending: Neurosurgery | Admitting: Neurosurgery

## 2024-06-01 VITALS — BP 126/82 | Temp 98.4°F | Ht 68.0 in | Wt 230.4 lb

## 2024-06-01 DIAGNOSIS — Z981 Arthrodesis status: Secondary | ICD-10-CM

## 2024-06-01 DIAGNOSIS — G959 Disease of spinal cord, unspecified: Secondary | ICD-10-CM | POA: Insufficient documentation

## 2024-06-01 DIAGNOSIS — M4802 Spinal stenosis, cervical region: Secondary | ICD-10-CM

## 2024-06-01 DIAGNOSIS — R29898 Other symptoms and signs involving the musculoskeletal system: Secondary | ICD-10-CM

## 2024-06-01 NOTE — Progress Notes (Signed)
   REFERRING PHYSICIAN:  Epifanio Alm SQUIBB, Md [address] [place],  [place] [address]  DOS: 03/15/24  C3-7 PSF, C4-6 decompression   HISTORY OF PRESENT ILLNESS: Eileen Chaney is status post cervical decompression and fusion.   She developed left greater than right extremity weakness after surgery.  Her deltoid muscles are most impacted.  She has been doing physical therapy with significant improvement in her triceps and biceps function on the left side.  She continues to have difficulty lifting her left arm. .   PHYSICAL EXAMINATION:  NEUROLOGICAL:  General: In no acute distress.   Awake, alert, oriented to person, place, and time.  Pupils equal round and reactive to light.  Facial tone is symmetric.  Tongue protrusion is midline.  There is no pronator drift.  Strength:  Side Biceps Triceps Deltoid Interossei Grip Wrist Ext. Wrist Flex.  R 4+ 4+ 4 4+ 5 5 5   L 3 4 2  4- 4+ 4+ 4+   Incision c/d/I  Imaging:  04/29/24 cervical xrays Without evidence of hardware malfunction   Assessment / Plan: SHARETHA NEWSON is improving after recent posterior cervical decompression and fusion.  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  This is substantially worse on the left than the right.  She has been improving with physical therapy.  She will continue physical therapy for now.  She is seeing my partner who is a specialist in peripheral nerve surgery in approximately 2 weeks.  He and I will discuss what recommendations he has to try to improve her deltoid function.  From an activity standpoint, her restrictions are lifted other than what she is able to do from a muscle function standpoint.  Reeves Daisy MD Dept of Neurosurgery

## 2024-06-02 ENCOUNTER — Ambulatory Visit: Admitting: Occupational Therapy

## 2024-06-02 ENCOUNTER — Ambulatory Visit: Admitting: Physical Therapy

## 2024-06-03 ENCOUNTER — Encounter: Admitting: Neurosurgery

## 2024-06-03 ENCOUNTER — Encounter: Admitting: Orthopedic Surgery

## 2024-06-03 ENCOUNTER — Encounter

## 2024-06-04 ENCOUNTER — Ambulatory Visit: Admitting: Physical Therapy

## 2024-06-08 ENCOUNTER — Ambulatory Visit

## 2024-06-08 ENCOUNTER — Ambulatory Visit: Attending: Physician Assistant

## 2024-06-08 DIAGNOSIS — M6281 Muscle weakness (generalized): Secondary | ICD-10-CM | POA: Diagnosis present

## 2024-06-08 DIAGNOSIS — R278 Other lack of coordination: Secondary | ICD-10-CM | POA: Diagnosis present

## 2024-06-08 DIAGNOSIS — G959 Disease of spinal cord, unspecified: Secondary | ICD-10-CM | POA: Diagnosis present

## 2024-06-10 ENCOUNTER — Ambulatory Visit

## 2024-06-10 NOTE — Progress Notes (Signed)
 Referring Physician:  Epifanio Alm SQUIBB, MD [address] [place],  [place] [address]  Primary Physician:  Epifanio Alm SQUIBB, MD  History of Present Illness: 06/14/2024 [name] is here today with a chief complaint of bilateral upper extremity weakness.  She has a history of cervical spondylosis and underwent a posterior cervical decompression and fusion.  She had severe bilateral upper extremity pain and weakness thankfully the right upper extremity has recovered significantly but still has some standing deficits.  On the left side she has severe deficits in the deltoid and external rotators as well as biceps.  She has been working with physical therapy to maintain her passive range of motion.  She has had an improvement in her motor function on the right.  On the left she has had some recovery of her left elbow flexion but continues to have significant weakness in her deltoid and external rotators.  She had a EMG done with Dr. Maree and is here to follow-up today.  Review of Systems:  A 10 point review of systems is negative, except for the pertinent positives and negatives detailed in the HPI.  Past Medical History: Past Medical History:  Diagnosis Date   Anxiety    Arthritis    Asthma    Cervical myelopathy with cervical radiculopathy (HCC)    Complication of anesthesia    with c sections had a had time getting her sedated   DDD (degenerative disc disease), cervical    Depression    Diabetes mellitus without complication (HCC)    type 2   Family history of adverse reaction to anesthesia    mother hard to wake up   Fibromyalgia    Hyperlipidemia    Hypertension    Idiopathic urticaria    Pneumonia    PONV (postoperative nausea and vomiting)    Undifferentiated inflammatory arthritis     Past Surgical History: Past Surgical History:  Procedure Laterality Date   ABDOMINAL HYSTERECTOMY  2018   uterine fibroids   ANKLE SURGERY Right 2022    ligament repair   ANTERIOR CERVICAL DECOMP/DISCECTOMY FUSION N/A 11/12/2021   Procedure: C3-5 ANTERIOR CERVICAL DISCECTOMY AND FUSION (GLOBUS HEDRON);  Surgeon: Clois Fret, MD;  Location: [facility];  Service: Neurosurgery;  Laterality: N/A;   CERVICAL WOUND DEBRIDEMENT N/A 01/20/2022   Procedure: posterior wound debridment;  Surgeon: Clois Fret, MD;  Location: [facility];  Service: Neurosurgery;  Laterality: N/A;   CESAREAN SECTION     X3 2000, 2001, 2009   DILATION AND CURETTAGE OF UTERUS     POSTERIOR CERVICAL FUSION/FORAMINOTOMY N/A 03/15/2024   Procedure: POSTERIOR CERVICAL FUSION/FORAMINOTOMY LEVEL 4;  Surgeon: Clois Fret, MD;  Location: [facility];  Service: Neurosurgery;  Laterality: N/A;  C4-7 Posterior Spinal Decompression C4-7 Posterior Spinal Instrumentation C5-7 Fusion   POSTERIOR CERVICAL LAMINECTOMY N/A 01/07/2022   Procedure: OPEN C5-7 POSTERIOR DECOMPRESSION;  Surgeon: Clois Fret, MD;  Location: [facility];  Service: Neurosurgery;  Laterality: N/A;   TONSILLECTOMY  2000   WISDOM TOOTH EXTRACTION      Allergies: Allergies as of 06/14/2024 - Review Complete 06/14/2024  Allergen Reaction Noted   Lactose Other (See Comments) 10/04/2022    Medications:  Current Outpatient Medications:    cetirizine (ZYRTEC) 10 MG tablet, Take 10 mg by mouth in the morning and at bedtime., Disp: , Rfl:    desvenlafaxine (PRISTIQ) 100 MG 24 hr tablet, Take 100 mg by mouth daily., Disp: , Rfl:    dutasteride (AVODART) 0.5  MG capsule, Take 0.5 mg by mouth daily., Disp: , Rfl:    fluticasone  (FLONASE ) 50 MCG/ACT nasal spray, Place 2 sprays into both nostrils at bedtime., Disp: , Rfl:    folic acid  (FOLVITE ) 1 MG tablet, Take 1 tablet (1 mg total) by mouth daily., Disp: 30 tablet, Rfl: 0   hydrOXYzine  (ATARAX ) 25 MG tablet, Take 2 tablets (50 mg total) by mouth at bedtime as needed (sleep)., Disp: 20 tablet, Rfl: 0   melatonin 5 MG TABS, Take 1 tablet (5 mg total) by  mouth at bedtime., Disp: 30 tablet, Rfl: 0   methotrexate 50 MG/2ML injection, Inject 15 mg into the skin every Saturday., Disp: , Rfl:    methylphenidate  (RITALIN ) 20 MG tablet, Take 20 mg by mouth in the morning., Disp: , Rfl:    minoxidil  (LONITEN ) 2.5 MG tablet, Take 2.5 mg by mouth in the morning., Disp: , Rfl:    Multiple Vitamin (MULTIVITAMIN WITH MINERALS) TABS tablet, Take 1 tablet by mouth in the morning., Disp: , Rfl:    NALTREXONE HCL, PAIN, PO, Take 6 mg by mouth daily., Disp: , Rfl:    predniSONE (DELTASONE) 5 MG tablet, Take by mouth. taper, Disp: , Rfl:    pregabalin  (LYRICA ) 150 MG capsule, Take 1 capsule (150 mg total) by mouth 3 (three) times daily., Disp: 90 capsule, Rfl: 0   spironolactone  (ALDACTONE ) 100 MG tablet, Take 100 mg by mouth 2 (two) times daily., Disp: , Rfl:    tirzepatide (MOUNJARO) 15 MG/0.5ML Pen, Inject 15 mg into the skin every Saturday., Disp: , Rfl:    Vitamin D , Ergocalciferol , (DRISDOL ) 1.25 MG (50000 UNIT) CAPS capsule, Take 1 capsule (50,000 Units total) by mouth every Saturday., Disp: 5 capsule, Rfl: 0  Social History: Social History   Tobacco Use   Smoking status: Never   Smokeless tobacco: Never  Vaping Use   Vaping status: Never Used  Substance Use Topics   Alcohol use: Yes    Alcohol/week: 0.0 - 1.0 standard drinks of alcohol    Comment: rarely   Drug use: Not Currently    Family Medical History: History reviewed. No pertinent family history.  Physical Examination: Vitals:   06/14/24 0900  BP: (!) 168/98    General: Patient is in no apparent distress. Attention to examination is appropriate.  Neck:   Supple.  Full range of motion.  Respiratory: Patient is breathing without any difficulty.   NEUROLOGICAL:     Awake, alert, oriented to person, place, and time.  Speech is clear and fluent.   Cranial Nerves: Pupils equal round and reactive to light.  Facial tone is symmetric.  Facial sensation is symmetric. Shoulder shrug is  symmetric. Tongue protrusion is midline.    Strength: Focused physical examination in the left upper extremity shows severe weakness in her left deltoid with no evidence of palpable activation, no evidence of palpable activation in the supra or infraspinatus.  She does have contraction of her trapezius muscles and levator.  She appears to be activating some of her rhomboid musculature.  Her biceps function is palpable, appears to be a 2 out of 5 as she can flex with gravity eliminated.  She does have some continued weakness with supination and does not have full range of motion.  Her brachial radialis does have some firing as well.  Her wrist extension continues to be quite strong at least 4+ out of 5.  Wrist flexion the same.  Hand grip the same.  Her elbow extension  is at least 4+ out of 5.   Imaging: Cervical MRI demonstrates no areas of active ongoing compression.  I have personally reviewed the images and agree with the above interpretation.  I reviewed her EMG performed at the [facility].  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  The most severely impacted muscles were the deltoid with no evidence of motor unit recovery.  She did have evidence of some motor unit recovery in her biceps.   Medical Decision Making/Assessment and Plan: Ms. Sliter is a pleasant 48 y.o. female with left upper extremity weakness in the setting of a previous cervical spine surgery.  Notably she has history of autoimmune disease and has had previous flares with steroid requirements.  She states that she woke up almost immediately with severe pain in her bilateral upper extremities and significant weakness.  They were evaluated for hardware positioning which appeared to be in good position with no evidence of foraminal breaches.  She has since had significant amount of physical therapy and has had some improvement in her biceps function nor elbow flexion function.  She is able to flex her elbow  with gravity eliminated.  Unfortunately she continues to have severe deficits in her deltoids and external rotators.  She does have a somewhat patchy examination.  She has some severe C5 and C6 level motor deficits however she continues to have some C5 and C6 myotomal innervation preserved.  Specifically she has very strong wrist extension which you often see decreased in patients with a severe C6 type issue.  She also has some rhomboid function but lacks deltoid function and supra/infraspinatus function.  We discussed different etiologies, she had discussed with Dr. Katrina that this could be due to neuropraxia from positioning, given the patchy presentation and severe periscapular pain that was followed by an improvement and then loss of muscle mass this is also consistent with presentation of a brachial plexopathy.  She is at somewhat of a risk given her history of autoimmune disease.  Her EMG did demonstrate a plexus level lesion rather than a radicular level lesion, the etiology of C5/6 palsy postoperatively has not clearly been defined.  In terms of planning would discuss a possible spinal accessory to suprascapular nerve transfer for external rotation function, and a radial axillary nerve transfer for deltoid reinnervation.  We did discuss that the outcomes for these are not perfect and that we would hope for some functional improvement but would not be expected to regain full strength or range of motion.  With her EMG showing no evidence of motor units in her deltoid likelihood of recovery is limited.  Will plan on getting a repeat EMG in her deltoid and supra/infraspinatus musculature to evaluate for any signs of reinnervation.     Thank you for involving me in the care of this patient.    Penne MICAEL Sharps MD/MSCR Neurosurgery   I spent a total of 45 minutes with the patient today on physical examination, counseling, coordination of her care going forward, and documentation.

## 2024-06-11 NOTE — Therapy (Signed)
 OUTPATIENT OCCUPATIONAL THERAPY NEURO TREATMENT NOTE  Patient Name: Eileen Chaney MRN: [id number] DOB:May 01, 1976, 48 y.o., female Today's Date: 06/11/2024  PCP: Dr. Alm Needle REFERRING PROVIDER: Toribio Pitch, PA-C  END OF SESSION:  OT End of Session - 06/11/24 1450     Visit Number 14    Number of Visits 24    Date for OT Re-Evaluation 06/29/24    OT Start Time 0805    OT Stop Time 0850    OT Time Calculation (min) 45 min    Activity Tolerance Patient tolerated treatment well    Behavior During Therapy WFL for tasks assessed/performed         Past Medical History:  Diagnosis Date   Anxiety    Arthritis    Asthma    Cervical myelopathy with cervical radiculopathy (HCC)    Complication of anesthesia    with c sections had a had time getting her sedated   DDD (degenerative disc disease), cervical    Depression    Diabetes mellitus without complication (HCC)    type 2   Family history of adverse reaction to anesthesia    mother hard to wake up   Fibromyalgia    Hyperlipidemia    Hypertension    Idiopathic urticaria    Pneumonia    PONV (postoperative nausea and vomiting)    Undifferentiated inflammatory arthritis    Past Surgical History:  Procedure Laterality Date   ABDOMINAL HYSTERECTOMY  2018   uterine fibroids   ANKLE SURGERY Right 2022   ligament repair   ANTERIOR CERVICAL DECOMP/DISCECTOMY FUSION N/A 11/12/2021   Procedure: C3-5 ANTERIOR CERVICAL DISCECTOMY AND FUSION (GLOBUS HEDRON);  Surgeon: Clois Fret, MD;  Location: [facility];  Service: Neurosurgery;  Laterality: N/A;   CERVICAL WOUND DEBRIDEMENT N/A 01/20/2022   Procedure: posterior wound debridment;  Surgeon: Clois Fret, MD;  Location: [facility];  Service: Neurosurgery;  Laterality: N/A;   CESAREAN SECTION     X3 2000, 2001, 2009   DILATION AND CURETTAGE OF UTERUS     POSTERIOR CERVICAL FUSION/FORAMINOTOMY N/A 03/15/2024   Procedure: POSTERIOR CERVICAL FUSION/FORAMINOTOMY  LEVEL 4;  Surgeon: Clois Fret, MD;  Location: [facility];  Service: Neurosurgery;  Laterality: N/A;  C4-7 Posterior Spinal Decompression C4-7 Posterior Spinal Instrumentation C5-7 Fusion   POSTERIOR CERVICAL LAMINECTOMY N/A 01/07/2022   Procedure: OPEN C5-7 POSTERIOR DECOMPRESSION;  Surgeon: Clois Fret, MD;  Location: [facility];  Service: Neurosurgery;  Laterality: N/A;   TONSILLECTOMY  2000   WISDOM TOOTH EXTRACTION     Patient Active Problem List   Diagnosis Date Noted   Anxiety with depression 03/24/2024   Cervical myelopathy (HCC) 03/15/2024   Spinal stenosis in cervical region 03/15/2024   Cervical radiculopathy 03/15/2024   S/P cervical spinal fusion 03/15/2024   Wound infection after surgery 01/20/2022   Gestational diabetes 06/02/2020   Arthritis 10/11/2019   DDD (degenerative disc disease), lumbar 10/11/2019   Hyperlipidemia 10/11/2019   Idiopathic urticaria 10/11/2019   Recurrent major depressive disorder, in partial remission (HCC) 10/11/2019   Spinal stenosis of lumbar region without neurogenic claudication 10/11/2019   Type 2 diabetes mellitus without complication, with long-term current use of insulin  (HCC) 10/11/2019   Elevated lactic acid level 09/29/2017   Leukocytosis 09/29/2017   Diabetes (HCC) 09/28/2017   Hypertension 09/28/2017   Mild asthma without complication 09/28/2017   Morbid obesity (HCC) 09/28/2017   Thalassemia 09/28/2017   GAD (generalized anxiety disorder) 07/25/2014   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, unspecified  03/14/2014   ONSET DATE: 03/15/24  REFERRING DIAG:  G95.9 (ICD-10-CM) - Cervical myelopathy (HCC)  Z98.1 (ICD-10-CM) - S/P cervical spinal fusion   THERAPY DIAG:  Muscle weakness (generalized)  Other lack of coordination  Cervical myelopathy (HCC)  Rationale for Evaluation and Treatment: Rehabilitation  SUBJECTIVE:  SUBJECTIVE STATEMENT: Pt reports her morning routine is getting easier.  Pt  accompanied by: self  PERTINENT HISTORY: Per chart: CIR d/c summary: Eileen Chaney is a 48 y.o. right-handed female with history significant for anxiety/depression as well as component of ADHD maintained on Ritalin , asthma, class I obesity with BMI 35.28 diabetes mellitus fibromyalgia hyperlipidemia hypertension posterior cervical laminectomy 01/07/2022, anterior cervical decompressive discectomy fusion 11/12/2021 complicated by cervical wound debridement 01/20/2022.  Per chart review patient lives with daughter.  She has good support from her family.  Two-level home bed and bath on main level independent community ambulator.  Presented to [facility]/15/2024 with increasing difficulty with dexterity in her hands as well as poor balance.  She was having trouble using her phone as well as difficulty with buttons and clasping her necklaces.  She continued to have ongoing neck and left arm and shoulder pain with numbness as well as low back pain that extends into her buttocks.  She had denied any recent falls.  Recent imaging showed severe central canal stenosis at C4-5 C5-6 with foraminal stenosis from C3-7.  Most recent preoperative labs showed a hemoglobin of 12.8 hematocrit 40.3 on 03/03/2024 and unremarkable chemistries.  Underwent posterior segmental instrumentation of C3-7 as well as posterior lateral arthrodesis from C5-7 with cervical laminectomy from C4-C6 for decompression of the spinal cord with bilateral foraminotomies and facetectomies at C3/4 C4-5 C5-6 and left foraminotomies C6-7 03/15/2024 per Dr. Reeves Nine.  Laced in a cervical brace for comfort.  She did complete a course of Decadron  taper.  Patient control with pain with the use of Dilaudid  as well as Valium .  Lovenox  added for DVT prophylaxis 03/16/2024.  Follow-up imaging CT/MRI cervical spine 03/16/2024 due to complaints of increasing neck pain and acute left arm weakness showed noted degenerative changes moderate to severe spinal canal  stenosis C3-4 with mild cord compression without definitive cord signal abnormality.  Multilevel foraminal stenosis greatest and severe bilaterally at C3-4 C5-6 on the left at C6-7.  Moderate to severe spinal canal stenosis on the right at C6-7.  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  Therapy evaluations completed due to patient decreased functional mobility was admitted for a comprehensive rehab program.   PRECAUTIONS: Cervical,   WEIGHT BEARING RESTRICTIONS: Yes ; no lifting >10 lbs  PAIN: 06/08/24: minimal pain-none/widespread Are you having pain? Yes: NPRS scale: 4/10 pain at rest, 8/10 with activity Pain location: neck, shoulders down to hands Pain description: nerve pain Aggravating factors: walking, sitting, standing, moving L arm  Relieving factors: reclining  FALLS: Has patient fallen in last 6 months? 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   OBJECTIVE:  Note: Objective measures were completed at Evaluation unless otherwise noted.  HAND DOMINANCE: Right  ADLs: Overall ADLs: Caregiver assist for ADLs Transfers/ambulation related to ADLs: using rollator at home for balance and to take pressure off shoulders and back Eating: difficulty cutting food Grooming: Max A with hair care d/t BUE weakness UB  Dressing: Limited engagement of LUE LB Dressing: Difficulty hiking pants d/t limited engagement of the LUE Toileting: modified indep using R dominant/less affected arm Bathing: supv for tub/shower transfers; assist to wash upper R side of body; reports her long handled sponge is ineffective Tub Shower transfers: transfer tub bench in place but able to step over shower with supv and sits on bench prn throughout shower Equipment: Transfer tub bench  IADLs: Shopping: dep Light housekeeping: Max A; pt reports she can load washing machine but is unable to load/unload dishwasher d/t the bending required Meal Prep: Partner managing meals Community mobility: increased pain/limited Medication management: indep Financial management: indep  POSTURE COMMENTS:  rounded shoulders and forward head  ACTIVITY TOLERANCE: Activity tolerance: Limited tolerance for sitting and standing; more comfortable in supine (performed eval partially supine on mat to manage pain)  UPPER EXTREMITY ROM:    Active ROM Right eval Left eval Left 05/11/24  Shoulder flexion 55 30 45  Shoulder abduction 55 20 20  Shoulder adduction     Shoulder extension     Shoulder internal rotation WNL Unable to attempt behind back Pt crawls fingers around hip and buttocks  Shoulder external rotation -20 Unable to attempt behind head Unable to attempt behind head against gravity  Elbow flexion     Elbow extension     Wrist flexion     Wrist extension     Wrist ulnar deviation     Wrist radial deviation     Wrist pronation     Wrist supination     (Blank rows = not tested)  UPPER EXTREMITY MMT:     MMT Right eval R 05/13/24 Left eval L 05/13/24  Shoulder flexion 2+ 3+/5 2- 2-/5  Shoulder abduction 2+ 2+/5 2- 2-/5  Shoulder adduction      Shoulder extension      Shoulder internal rotation 4 4 2+ 3-/5  Shoulder external rotation 3- 3+ 2 2/5  Middle trapezius      Lower trapezius      Elbow flexion 4+ 5 1 to 2- (will  assess more thoroughly next session) 2-/5  Elbow extension 5  4 4+/5  Wrist flexion 5  4 5   Wrist extension 5  4 5   Wrist ulnar deviation      Wrist radial deviation      Wrist pronation 5  3+ 5/5  Wrist supination 5  3- 4/5  (Blank rows = not tested)  HAND FUNCTION: Grip strength: Right: 68 lbs; Left: 34 lbs, Lateral pinch: Right: 13 lbs, Left: 7 lbs, and 3 point pinch: Right: 8 lbs, Left: 5 lbs 05/13/24: Grip strength: Right: 67 lbs; Left: 55 lbs, Lateral pinch: Right: 16 lbs, Left: 12 lbs, and 3 point pinch: Right: 13 lbs, Left: 13 lbs  COORDINATION: 9 Hole Peg test: Right: 24 sec; Left: 47 sec  05/23/24: 9 Hole Peg test: Right: 25 sec; Left: 47 sec  SENSATION: tingling/numbness in L hand digits 1-3, diminished up the L arm, no tingling in the RUE  COGNITION: Overall cognitive status: Within functional limits for tasks assessed  VISION: WNL  OBSERVATIONS:  Pt visibly in pain in with facial grimacing during shoulder shrugs  NEXT MD APPT: Return to neuro surgeon Sept.                                                                                                                            TREATMENT DATE: 06/08/24  Therapeutic Exercise: In supine: AAROM and min resistance with yellow theraband -AAROM for L elbow flex, ER with humerus abd to 45*, shoulder flex 0-90*, horiz abd, abd, scapular retraction/row, L chest press x2 sets 10 reps -Yellow theraband for L elbow ext, L shoulder IR, R/L shoulder ext, R shoulder IR/ER, R abd, R horiz abd/add, R scapular retraction/row, R chest press x2 sets 10 reps -Scifit: 5 min: forward 2.5 min, reverse 2 min, 0.5 min forward; intensity 2-3.0  Self Care: -Review of ADL routines/use of AE/HEP  PATIENT EDUCATION: Education details: HEP Person educated: Patient Education method: Explanation and Verbal cues Education comprehension: verbalized understanding, verbal cues required, and needs further education  HOME EXERCISE PROGRAM: 04/26/24:   HEP through [employer]: -AAROM at the tabletop for internal, and external rotation. -Shoulder flexion/extension AAROM with dowel/cane base blocked at the wall -Shoulder circles AAROM with the dowel/cane into wall- with base blacked at the wall -Elbow flexion, extension extension AAROM with dowel/cane into wall with base of dowel/cane supported at the wall.  04/20/24:-LUE AAROM/AROM to tolerance and within cervical precautions; shoulder shrugs and retraction in front of mirror, theraputty 04/15/24: -positioning for comfort 04/13/24: -Bilateral AAROM Shoulder flexion and abduction tabletop exercises while seated. -AROM, and resistive forearm supination sitting with the LUE supported on pillows. -Supine left elbow flexion in gravity eliminated plane, progressing to partial gravity with resistance. 04/08/24: Refer to treatment note for HEP details.  GOALS: Goals reviewed with patient? Yes  SHORT TERM GOALS: Target date: 05/18/24  Pt will be indep to perform HEP for improving BUE strength/flexibility/coordination for daily tasks. Baseline: Eval: Not yet initiated. 05/13/24: performs HEP with handout Goal status: Achieved  LONG TERM GOALS: Target date: 06/29/24  Pt will increase L grip strength by 20 lbs or more in order to improve ability to grasp and carry heavier ADL supplies.  Baseline: Eval: L grip 34 lbs (R 68 lbs). 05/13/24: L grip 55 lbs Goal status: Achieved  2.  Pt will increase L lateral pinch strength by 3 or more lbs to maintain grasp of fork for independence with cutting food. Baseline: Eval: L 7 lbs (R 13 lbs). 05/13/24: L 12 lbs Goal status: Achieved  3.  Pt will increase R shoulder and LUE strength by 1 MM grade or more to better engage BUEs into ADLs.  Baseline: Eval: See above MMT; pt must use R hand to lift LUE up from lap to table top; L shoulder mobility BFL, R limited below 90*.  Pt predominately performing ADLs with R dominant arm. 1/85/74: Achieved with  R hand improving as  seen above, L wrist achieved, L shoulder remains the same.  Goal status: Ongoing  4.  Pt will increase L hand FMC skills to manage clothing fasteners independently and manipulate small ADL supplies with reduced dropping.  Baseline: Eval: L 9 hole peg test 47 sec (R 24 sec). 05/13/24: L 47 sec.  Goal status: Ongoing  5.  Pt will tolerate manual therapy, therapeutic modalities, and exercises to decrease pain in bilat shoulders/LUE to a reported 5/10 pain or less with activity.  Baseline: Eval: up to 10/10 pain with activity. 05/13/24: 1/20 at rest, 5/10 with activity.  Goal status: Achieved  ASSESSMENT: CLINICAL IMPRESSION: Pt reports that she had ordered a dressing stick but it did not end up working well for her.  Pt reports morning routine is becoming easier, and that she's been able to use her mantle to prop her L arm for putting her hair up in a bun.  Pt also reports that she's choosing more loose fitting/stretchy shirts, which has eased donning.  Pt reports that her pain has improved this week with prednisone.  Pt tolerated supine and sitting AAROM and resistive strengthening with yellow theraband in BUEs for above noted planes.  Pt requires at least min-mod A from OT to maintain form/reduce substitution patterns during BUE strengthening.  Pt. continues to benefit from skilled OT to address above noted deficits and work towards above noted goals in OT poc in order to maximize indep with daily tasks.   PERFORMANCE DEFICITS: in functional skills including ADLs, IADLs, coordination, dexterity, sensation, ROM, strength, pain, flexibility, Fine motor control, Gross motor control, mobility, balance, endurance, decreased knowledge of precautions, decreased knowledge of use of DME, skin integrity, and UE functional use, cognitive skills including emotional, and psychosocial skills including coping strategies, environmental adaptation, habits, and routines and behaviors.   IMPAIRMENTS: are limiting  patient from ADLs, IADLs, rest and sleep, work, leisure, and social participation.   CO-MORBIDITIES: has co-morbidities such as anxiety/depression, HTN, asthma, DM2, DDD (lumbar) that affects occupational performance. Patient will benefit from skilled OT to address above impairments and improve overall function.  MODIFICATION OR ASSISTANCE TO COMPLETE EVALUATION: Min-Moderate modification of tasks or assist with assess necessary to complete an evaluation.  OT OCCUPATIONAL PROFILE AND HISTORY: Detailed assessment: Review of records and additional review of physical, cognitive, psychosocial history related to current functional performance.  CLINICAL DECISION MAKING: Moderate - several treatment options, min-mod task modification necessary  REHAB POTENTIAL: Good  EVALUATION COMPLEXITY: Moderate  PLAN:  OT FREQUENCY: 2x/week  OT DURATION: 12 weeks  PLANNED INTERVENTIONS: 97168 OT Re-evaluation, 97535 self care/ADL training, 02889 therapeutic exercise, 97530 therapeutic activity, 97112 neuromuscular re-education, 97140 manual therapy, 97116 gait training, 02989 moist heat, 97010 cryotherapy, 97034 contrast bath, 97750 Physical Performance Testing, 02239 Orthotic Initial, 97763 Orthotic/Prosthetic subsequent, passive range of motion, balance training, functional mobility training, psychosocial skills training, energy conservation, coping strategies training, patient/family education, and DME and/or AE instructions  RECOMMENDED OTHER SERVICES: None at this time  CONSULTED AND AGREED WITH PLAN OF CARE: Patient and family member/caregiver  PLAN FOR NEXT SESSION: see above  Inocente Blazing, MS, OTR/L  06/11/2024, 2:52 PM

## 2024-06-14 ENCOUNTER — Encounter: Payer: Self-pay | Admitting: Neurosurgery

## 2024-06-14 ENCOUNTER — Ambulatory Visit (INDEPENDENT_AMBULATORY_CARE_PROVIDER_SITE_OTHER): Admitting: Neurosurgery

## 2024-06-14 VITALS — BP 168/98 | Ht 68.0 in | Wt 234.0 lb

## 2024-06-14 DIAGNOSIS — Z981 Arthrodesis status: Secondary | ICD-10-CM

## 2024-06-14 DIAGNOSIS — R29898 Other symptoms and signs involving the musculoskeletal system: Secondary | ICD-10-CM | POA: Diagnosis not present

## 2024-06-14 DIAGNOSIS — G540 Brachial plexus disorders: Secondary | ICD-10-CM

## 2024-06-14 HISTORY — DX: Other symptoms and signs involving the musculoskeletal system: R29.898

## 2024-06-14 HISTORY — DX: Brachial plexus disorders: G54.0

## 2024-06-15 ENCOUNTER — Telehealth: Payer: Self-pay | Admitting: Neurosurgery

## 2024-06-15 ENCOUNTER — Ambulatory Visit

## 2024-06-15 DIAGNOSIS — R278 Other lack of coordination: Secondary | ICD-10-CM

## 2024-06-15 DIAGNOSIS — M6281 Muscle weakness (generalized): Secondary | ICD-10-CM | POA: Diagnosis not present

## 2024-06-15 DIAGNOSIS — G959 Disease of spinal cord, unspecified: Secondary | ICD-10-CM

## 2024-06-15 NOTE — Telephone Encounter (Signed)
 Patient notified and expressed understanding.

## 2024-06-15 NOTE — Telephone Encounter (Signed)
 Patient is calling to see about scheduling surgery and to follow up on the EMG order that was to be placed.

## 2024-06-15 NOTE — Therapy (Signed)
 OUTPATIENT OCCUPATIONAL THERAPY NEURO TREATMENT NOTE  Patient Name: Eileen Chaney MRN: [id number] DOB:Feb 13, 1976, 48 y.o., female Today's Date: 06/15/2024  PCP: Dr. Alm Needle REFERRING PROVIDER: Toribio Pitch, PA-C  END OF SESSION:  OT End of Session - 06/15/24 1040     Visit Number 15    Number of Visits 24    Date for OT Re-Evaluation 06/29/24    OT Start Time 0847    OT Stop Time 0930    OT Time Calculation (min) 43 min    Activity Tolerance Patient tolerated treatment well    Behavior During Therapy WFL for tasks assessed/performed         Past Medical History:  Diagnosis Date   Anxiety    Arthritis    Asthma    Cervical myelopathy with cervical radiculopathy (HCC)    Complication of anesthesia    with c sections had a had time getting her sedated   DDD (degenerative disc disease), cervical    Depression    Diabetes mellitus without complication (HCC)    type 2   Family history of adverse reaction to anesthesia    mother hard to wake up   Fibromyalgia    Hyperlipidemia    Hypertension    Idiopathic urticaria    Pneumonia    PONV (postoperative nausea and vomiting)    Undifferentiated inflammatory arthritis    Past Surgical History:  Procedure Laterality Date   ABDOMINAL HYSTERECTOMY  2018   uterine fibroids   ANKLE SURGERY Right 2022   ligament repair   ANTERIOR CERVICAL DECOMP/DISCECTOMY FUSION N/A 11/12/2021   Procedure: C3-5 ANTERIOR CERVICAL DISCECTOMY AND FUSION (GLOBUS HEDRON);  Surgeon: Clois Fret, MD;  Location: [facility];  Service: Neurosurgery;  Laterality: N/A;   CERVICAL WOUND DEBRIDEMENT N/A 01/20/2022   Procedure: posterior wound debridment;  Surgeon: Clois Fret, MD;  Location: [facility];  Service: Neurosurgery;  Laterality: N/A;   CESAREAN SECTION     X3 2000, 2001, 2009   DILATION AND CURETTAGE OF UTERUS     POSTERIOR CERVICAL FUSION/FORAMINOTOMY N/A 03/15/2024   Procedure: POSTERIOR CERVICAL FUSION/FORAMINOTOMY  LEVEL 4;  Surgeon: Clois Fret, MD;  Location: [facility];  Service: Neurosurgery;  Laterality: N/A;  C4-7 Posterior Spinal Decompression C4-7 Posterior Spinal Instrumentation C5-7 Fusion   POSTERIOR CERVICAL LAMINECTOMY N/A 01/07/2022   Procedure: OPEN C5-7 POSTERIOR DECOMPRESSION;  Surgeon: Clois Fret, MD;  Location: [facility];  Service: Neurosurgery;  Laterality: N/A;   TONSILLECTOMY  2000   WISDOM TOOTH EXTRACTION     Patient Active Problem List   Diagnosis Date Noted   Left arm weakness 06/14/2024   Brachial plexus lesion 06/14/2024   Anxiety with depression 03/24/2024   Cervical myelopathy (HCC) 03/15/2024   Spinal stenosis in cervical region 03/15/2024   Cervical radiculopathy 03/15/2024   S/P cervical spinal fusion 03/15/2024   Wound infection after surgery 01/20/2022   Gestational diabetes 06/02/2020   Arthritis 10/11/2019   DDD (degenerative disc disease), lumbar 10/11/2019   Hyperlipidemia 10/11/2019   Idiopathic urticaria 10/11/2019   Recurrent major depressive disorder, in partial remission (HCC) 10/11/2019   Spinal stenosis of lumbar region without neurogenic claudication 10/11/2019   Type 2 diabetes mellitus without complication, with long-term current use of insulin  (HCC) 10/11/2019   Elevated lactic acid level 09/29/2017   Leukocytosis 09/29/2017   Diabetes (HCC) 09/28/2017   Hypertension 09/28/2017   Mild asthma without complication 09/28/2017   Morbid obesity (HCC) 09/28/2017   Thalassemia 09/28/2017   GAD (generalized anxiety disorder)  07/25/2014   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, unspecified 03/14/2014   ONSET DATE: 03/15/24  REFERRING DIAG:  G95.9 (ICD-10-CM) - Cervical myelopathy (HCC)  Z98.1 (ICD-10-CM) - S/P cervical spinal fusion   THERAPY DIAG:  Muscle weakness (generalized)  Other lack of coordination  Cervical myelopathy (HCC)  Rationale for Evaluation and Treatment: Rehabilitation  SUBJECTIVE:  SUBJECTIVE  STATEMENT: Pt reports that she had a good neurology follow up last week and she plans to move forward with a second surgery for a nerve transfer.  Pt reports they will plan another EMG before surgery, just to make sure nothing has changed, though no change is expected.  Pt accompanied by: self  PERTINENT HISTORY: Per chart: CIR d/c summary: Eileen Chaney is a 48 y.o. right-handed female with history significant for anxiety/depression as well as component of ADHD maintained on Ritalin , asthma, class I obesity with BMI 35.28 diabetes mellitus fibromyalgia hyperlipidemia hypertension posterior cervical laminectomy 01/07/2022, anterior cervical decompressive discectomy fusion 11/12/2021 complicated by cervical wound debridement 01/20/2022.  Per chart review patient lives with daughter.  She has good support from her family.  Two-level home bed and bath on main level independent community ambulator.  Presented to [facility]/15/2024 with increasing difficulty with dexterity in her hands as well as poor balance.  She was having trouble using her phone as well as difficulty with buttons and clasping her necklaces.  She continued to have ongoing neck and left arm and shoulder pain with numbness as well as low back pain that extends into her buttocks.  She had denied any recent falls.  Recent imaging showed severe central canal stenosis at C4-5 C5-6 with foraminal stenosis from C3-7.  Most recent preoperative labs showed a hemoglobin of 12.8 hematocrit 40.3 on 03/03/2024 and unremarkable chemistries.  Underwent posterior segmental instrumentation of C3-7 as well as posterior lateral arthrodesis from C5-7 with cervical laminectomy from C4-C6 for decompression of the spinal cord with bilateral foraminotomies and facetectomies at C3/4 C4-5 C5-6 and left foraminotomies C6-7 03/15/2024 per Dr. Reeves Nine.  Laced in a cervical brace for comfort.  She did complete a course of Decadron  taper.  Patient control with pain with  the use of Dilaudid  as well as Valium .  Lovenox  added for DVT prophylaxis 03/16/2024.  Follow-up imaging CT/MRI cervical spine 03/16/2024 due to complaints of increasing neck pain and acute left arm weakness showed noted degenerative changes moderate to severe spinal canal stenosis C3-4 with mild cord compression without definitive cord signal abnormality.  Multilevel foraminal stenosis greatest and severe bilaterally at C3-4 C5-6 on the left at C6-7.  Moderate to severe spinal canal stenosis on the right at C6-7.  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  Therapy evaluations completed due to patient decreased functional mobility was admitted for a comprehensive rehab program.   PRECAUTIONS: Cervical,   WEIGHT BEARING RESTRICTIONS: Yes ; no lifting >10 lbs  PAIN: 06/15/24: none Are you having pain? Yes: NPRS scale: 4/10 pain at rest, 8/10 with activity Pain location: neck, shoulders down to hands Pain description: nerve pain Aggravating factors: walking, sitting, standing, moving L arm  Relieving factors: reclining  FALLS: Has patient fallen in last 6 months? 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   OBJECTIVE:  Note: Objective measures were completed at Evaluation unless otherwise noted.  HAND DOMINANCE:  Right  ADLs: Overall ADLs: Caregiver assist for ADLs Transfers/ambulation related to ADLs: using rollator at home for balance and to take pressure off shoulders and back Eating: difficulty cutting food Grooming: Max A with hair care d/t BUE weakness UB Dressing: Limited engagement of LUE LB Dressing: Difficulty hiking pants d/t limited engagement of the LUE Toileting: modified indep using R dominant/less affected arm Bathing: supv for tub/shower transfers; assist to wash upper R side of body; reports her long handled sponge is ineffective Tub Shower transfers: transfer tub bench in place but able to step over shower with supv and sits on bench prn throughout shower Equipment: Transfer tub bench  IADLs: Shopping: dep Light housekeeping: Max A; pt reports she can load washing machine but is unable to load/unload dishwasher d/t the bending required Meal Prep: Partner managing meals Community mobility: increased pain/limited Medication management: indep Financial management: indep  POSTURE COMMENTS:  rounded shoulders and forward head  ACTIVITY TOLERANCE: Activity tolerance: Limited tolerance for sitting and standing; more comfortable in supine (performed eval partially supine on mat to manage pain)  UPPER EXTREMITY ROM:    Active ROM Right eval Left eval Left 05/11/24  Shoulder flexion 55 30 45  Shoulder abduction 55 20 20  Shoulder adduction     Shoulder extension     Shoulder internal rotation WNL Unable to attempt behind back Pt crawls fingers around hip and buttocks  Shoulder external rotation -20 Unable to attempt behind head Unable to attempt behind head against gravity  Elbow flexion     Elbow extension     Wrist flexion     Wrist extension     Wrist ulnar deviation     Wrist radial deviation     Wrist pronation     Wrist supination     (Blank rows = not tested)  UPPER EXTREMITY MMT:     MMT Right eval R 05/13/24 Left eval L 05/13/24  Shoulder flexion 2+ 3+/5  2- 2-/5  Shoulder abduction 2+ 2+/5 2- 2-/5  Shoulder adduction      Shoulder extension      Shoulder internal rotation 4 4 2+ 3-/5  Shoulder external rotation 3- 3+ 2 2/5  Middle trapezius      Lower trapezius      Elbow flexion 4+ 5 1 to 2- (will assess more thoroughly next session) 2-/5  Elbow extension 5  4 4+/5  Wrist flexion 5  4 5   Wrist extension 5  4 5   Wrist ulnar deviation      Wrist radial deviation      Wrist pronation 5  3+ 5/5  Wrist supination 5  3- 4/5  (Blank rows = not tested)  HAND FUNCTION: Grip strength: Right: 68 lbs; Left: 34 lbs, Lateral pinch: Right: 13 lbs, Left: 7 lbs, and 3 point pinch: Right: 8 lbs, Left: 5 lbs 05/13/24: Grip strength: Right: 67 lbs; Left: 55 lbs, Lateral pinch: Right: 16 lbs, Left: 12 lbs, and 3  point pinch: Right: 13 lbs, Left: 13 lbs  COORDINATION: 9 Hole Peg test: Right: 24 sec; Left: 47 sec  05/23/24: 9 Hole Peg test: Right: 25 sec; Left: 47 sec  SENSATION: tingling/numbness in L hand digits 1-3, diminished up the L arm, no tingling in the RUE  COGNITION: Overall cognitive status: Within functional limits for tasks assessed  VISION: WNL  OBSERVATIONS:  Pt visibly in pain in with facial grimacing during shoulder shrugs  NEXT MD APPT: Return to neuro surgeon Sept.                                                                                                                            TREATMENT DATE: 06/15/24  Therapeutic Exercise: BUE strengthening -Scifit: 7 min: forward 2.5 min, reverse 2.5 min, 2 min forward; intensity 2.5 In supine: AAROM and min resistance with yellow theraband -AAROM for L elbow flex, ER with humerus abd to 45*, shoulder flex 0-90* -Yellow theraband for L elbow ext, L shoulder IR, L shoulder ext, horiz abd/add, scapular retraction/row, L chest press x2 sets 10 reps  -Yellow theraband for R shoulder IR/ER, R shoulder abd/add, R horiz abd/add, R scapular retraction/row, R chest press x2 sets 10  reps  Self Care: -Anticipated therapy poc post nerve transfer sx, with pt verbalizing recovery period to be ~2 years, per MD.    PATIENT EDUCATION: Education details: BUE strengthening Person educated: Patient Education method: Explanation and Verbal cues Education comprehension: verbalized understanding, verbal cues required, and needs further education  HOME EXERCISE PROGRAM: 04/26/24:  HEP through [facility]: -AAROM at the tabletop for internal, and external rotation. -Shoulder flexion/extension AAROM with dowel/cane base blocked at the wall -Shoulder circles AAROM with the dowel/cane into wall- with base blacked at the wall -Elbow flexion, extension extension AAROM with dowel/cane into wall with base of dowel/cane supported at the wall.  04/20/24:-LUE AAROM/AROM to tolerance and within cervical precautions; shoulder shrugs and retraction in front of mirror, theraputty 04/15/24: -positioning for comfort 04/13/24: -Bilateral AAROM Shoulder flexion and abduction tabletop exercises while seated. -AROM, and resistive forearm supination sitting with the LUE supported on pillows. -Supine left elbow flexion in gravity eliminated plane, progressing to partial gravity with resistance. 04/08/24: Refer to treatment note for HEP details.  GOALS: Goals reviewed with patient? Yes  SHORT TERM GOALS: Target date: 05/18/24  Pt will be indep to perform HEP for improving BUE strength/flexibility/coordination for daily tasks. Baseline: Eval: Not yet initiated. 05/13/24: performs HEP with handout Goal status: Achieved  LONG TERM GOALS: Target date: 06/29/24  Pt will increase L grip strength by 20 lbs or more in order to improve ability to grasp and carry heavier ADL supplies.  Baseline: Eval: L grip 34 lbs (R 68 lbs). 05/13/24: L grip 55 lbs Goal status: Achieved  2.  Pt will increase L lateral pinch strength by 3 or more lbs to maintain grasp of fork for independence with cutting food. Baseline: Eval:  L 7 lbs (  R 13 lbs). 05/13/24: L 12 lbs Goal status: Achieved  3.  Pt will increase R shoulder and LUE strength by 1 MM grade or more to better engage BUEs into ADLs.  Baseline: Eval: See above MMT; pt must use R hand to lift LUE up from lap to table top; L shoulder mobility BFL, R limited below 90*.  Pt predominately performing ADLs with R dominant arm. 1/85/74: Achieved with R hand improving as seen above, L wrist achieved, L shoulder remains the same.  Goal status: Ongoing  4.  Pt will increase L hand FMC skills to manage clothing fasteners independently and manipulate small ADL supplies with reduced dropping.  Baseline: Eval: L 9 hole peg test 47 sec (R 24 sec). 05/13/24: L 47 sec.  Goal status: Ongoing  5.  Pt will tolerate manual therapy, therapeutic modalities, and exercises to decrease pain in bilat shoulders/LUE to a reported 5/10 pain or less with activity.  Baseline: Eval: up to 10/10 pain with activity. 05/13/24: 1/20 at rest, 5/10 with activity.  Goal status: Achieved  ASSESSMENT: CLINICAL IMPRESSION: Pt with good tolerance to BUE strengthening this date, tolerating 2 sets 10 reps for AAROM/light resistance yellow theraband as noted above.  Pt reports morning routine continues to be challenging, but has improved with various clothing adaptations and positioning strategies.  Pt planning to move forward with nerve transfer sx, but will have EMG scheduled prior to this.  Pt will continue to benefit from strengthening accessory musculature surrounding L deltoid to maximize functional use of LUE, and continue to target RUE weakness for same.  Pt. continues to benefit from skilled OT to work towards above noted goals in OT poc in order to maximize indep with daily tasks.   PERFORMANCE DEFICITS: in functional skills including ADLs, IADLs, coordination, dexterity, sensation, ROM, strength, pain, flexibility, Fine motor control, Gross motor control, mobility, balance, endurance, decreased  knowledge of precautions, decreased knowledge of use of DME, skin integrity, and UE functional use, cognitive skills including emotional, and psychosocial skills including coping strategies, environmental adaptation, habits, and routines and behaviors.   IMPAIRMENTS: are limiting patient from ADLs, IADLs, rest and sleep, work, leisure, and social participation.   CO-MORBIDITIES: has co-morbidities such as anxiety/depression, HTN, asthma, DM2, DDD (lumbar) that affects occupational performance. Patient will benefit from skilled OT to address above impairments and improve overall function.  MODIFICATION OR ASSISTANCE TO COMPLETE EVALUATION: Min-Moderate modification of tasks or assist with assess necessary to complete an evaluation.  OT OCCUPATIONAL PROFILE AND HISTORY: Detailed assessment: Review of records and additional review of physical, cognitive, psychosocial history related to current functional performance.  CLINICAL DECISION MAKING: Moderate - several treatment options, min-mod task modification necessary  REHAB POTENTIAL: Good  EVALUATION COMPLEXITY: Moderate  PLAN:  OT FREQUENCY: 2x/week  OT DURATION: 12 weeks  PLANNED INTERVENTIONS: 97168 OT Re-evaluation, 97535 self care/ADL training, 02889 therapeutic exercise, 97530 therapeutic activity, 97112 neuromuscular re-education, 97140 manual therapy, 97116 gait training, 02989 moist heat, 97010 cryotherapy, 97034 contrast bath, 97750 Physical Performance Testing, 02239 Orthotic Initial, 97763 Orthotic/Prosthetic subsequent, passive range of motion, balance training, functional mobility training, psychosocial skills training, energy conservation, coping strategies training, patient/family education, and DME and/or AE instructions  RECOMMENDED OTHER SERVICES: None at this time  CONSULTED AND AGREED WITH PLAN OF CARE: Patient and family member/caregiver  PLAN FOR NEXT SESSION: see above  Inocente Blazing, MS, OTR/L  06/15/2024, 10:42  AM

## 2024-06-17 ENCOUNTER — Ambulatory Visit

## 2024-06-17 ENCOUNTER — Ambulatory Visit: Admitting: Occupational Therapy

## 2024-06-20 LAB — GENECONNECT MOLECULAR SCREEN

## 2024-06-21 NOTE — Telephone Encounter (Signed)
 Patient is calling back to follow up with our office to see if Dr. Claudene has heard from Dr. Maree? She states that she would really like to proceed with scheduling surgery without having the repeat EMG.

## 2024-06-22 ENCOUNTER — Ambulatory Visit

## 2024-06-22 ENCOUNTER — Ambulatory Visit: Admitting: Occupational Therapy

## 2024-06-23 ENCOUNTER — Ambulatory Visit

## 2024-06-23 ENCOUNTER — Telehealth: Payer: Self-pay | Admitting: Medical Genetics

## 2024-06-23 ENCOUNTER — Other Ambulatory Visit: Payer: Self-pay | Admitting: Medical Genetics

## 2024-06-23 DIAGNOSIS — M6281 Muscle weakness (generalized): Secondary | ICD-10-CM | POA: Diagnosis not present

## 2024-06-23 DIAGNOSIS — R278 Other lack of coordination: Secondary | ICD-10-CM

## 2024-06-23 DIAGNOSIS — G959 Disease of spinal cord, unspecified: Secondary | ICD-10-CM

## 2024-06-23 DIAGNOSIS — Z006 Encounter for examination for normal comparison and control in clinical research program: Secondary | ICD-10-CM

## 2024-06-23 NOTE — Therapy (Signed)
 OUTPATIENT OCCUPATIONAL THERAPY NEURO TREATMENT NOTE  Patient Name: Eileen Chaney MRN: [id number] DOB:[birth year], 48 y.o., female Today's Date: 06/23/2024  PCP: Dr. Alm Needle REFERRING PROVIDER: Toribio Pitch, PA-C  END OF SESSION:  OT End of Session - 06/23/24 1319     Visit Number 16    Number of Visits 24    Date for Recertification  06/29/24    OT Start Time 0938    OT Stop Time 1005    OT Time Calculation (min) 27 min    Activity Tolerance Patient tolerated treatment well    Behavior During Therapy WFL for tasks assessed/performed         Past Medical History:  Diagnosis Date   Anxiety    Arthritis    Asthma    Cervical myelopathy with cervical radiculopathy (HCC)    Complication of anesthesia    with c sections had a had time getting her sedated   DDD (degenerative disc disease), cervical    Depression    Diabetes mellitus without complication (HCC)    type 2   Family history of adverse reaction to anesthesia    mother hard to wake up   Fibromyalgia    Hyperlipidemia    Hypertension    Idiopathic urticaria    Pneumonia    PONV (postoperative nausea and vomiting)    Undifferentiated inflammatory arthritis    Past Surgical History:  Procedure Laterality Date   ABDOMINAL HYSTERECTOMY  2018   uterine fibroids   ANKLE SURGERY Right 2022   ligament repair   ANTERIOR CERVICAL DECOMP/DISCECTOMY FUSION N/A 11/12/2021   Procedure: C3-5 ANTERIOR CERVICAL DISCECTOMY AND FUSION (GLOBUS HEDRON);  Surgeon: Clois Fret, MD;  Location: [facility];  Service: Neurosurgery;  Laterality: N/A;   CERVICAL WOUND DEBRIDEMENT N/A 01/20/2022   Procedure: posterior wound debridment;  Surgeon: Clois Fret, MD;  Location: [facility];  Service: Neurosurgery;  Laterality: N/A;   CESAREAN SECTION     X3 2000, 2001, 2009   DILATION AND CURETTAGE OF UTERUS     POSTERIOR CERVICAL FUSION/FORAMINOTOMY N/A 03/15/2024   Procedure: POSTERIOR CERVICAL FUSION/FORAMINOTOMY  LEVEL 4;  Surgeon: Clois Fret, MD;  Location: [facility];  Service: Neurosurgery;  Laterality: N/A;  C4-7 Posterior Spinal Decompression C4-7 Posterior Spinal Instrumentation C5-7 Fusion   POSTERIOR CERVICAL LAMINECTOMY N/A 01/07/2022   Procedure: OPEN C5-7 POSTERIOR DECOMPRESSION;  Surgeon: Clois Fret, MD;  Location: [facility];  Service: Neurosurgery;  Laterality: N/A;   TONSILLECTOMY  2000   WISDOM TOOTH EXTRACTION     Patient Active Problem List   Diagnosis Date Noted   Left arm weakness 06/14/2024   Brachial plexus lesion 06/14/2024   Anxiety with depression 03/24/2024   Cervical myelopathy (HCC) 03/15/2024   Spinal stenosis in cervical region 03/15/2024   Cervical radiculopathy 03/15/2024   S/P cervical spinal fusion 03/15/2024   Wound infection after surgery 01/20/2022   Gestational diabetes 06/02/2020   Arthritis 10/11/2019   DDD (degenerative disc disease), lumbar 10/11/2019   Hyperlipidemia 10/11/2019   Idiopathic urticaria 10/11/2019   Recurrent major depressive disorder, in partial remission 10/11/2019   Spinal stenosis of lumbar region without neurogenic claudication 10/11/2019   Type 2 diabetes mellitus without complication, with long-term current use of insulin  (HCC) 10/11/2019   Elevated lactic acid level 09/29/2017   Leukocytosis 09/29/2017   Diabetes (HCC) 09/28/2017   Hypertension 09/28/2017   Mild asthma without complication 09/28/2017   Morbid obesity (HCC) 09/28/2017   Thalassemia 09/28/2017   GAD (generalized anxiety disorder) 07/25/2014  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, unspecified 03/14/2014   ONSET DATE: 03/15/24  REFERRING DIAG:  G95.9 (ICD-10-CM) - Cervical myelopathy (HCC)  Z98.1 (ICD-10-CM) - S/P cervical spinal fusion   THERAPY DIAG:  Muscle weakness (generalized)  Other lack of coordination  Cervical myelopathy (HCC)  Rationale for Evaluation and Treatment: Rehabilitation  SUBJECTIVE:  SUBJECTIVE  STATEMENT: Pt arrived late today and stated that she needs to leave early d/t meetings.  Pt agreeable to shortened tx session. Pt accompanied by: self  PERTINENT HISTORY: Per chart: CIR d/c summary: Eileen Chaney is a 48 y.o. right-handed female with history significant for anxiety/depression as well as component of ADHD maintained on Ritalin , asthma, class I obesity with BMI 35.28 diabetes mellitus fibromyalgia hyperlipidemia hypertension posterior cervical laminectomy 01/07/2022, anterior cervical decompressive discectomy fusion 11/12/2021 complicated by cervical wound debridement 01/20/2022.  Per chart review patient lives with daughter.  She has good support from her family.  Two-level home bed and bath on main level independent community ambulator.  [facility] to [facility] with increasing difficulty with dexterity in her hands as well as poor balance.  She was having trouble using her phone as well as difficulty with buttons and clasping her necklaces.  She continued to have ongoing neck and left arm and shoulder pain with numbness as well as low back pain that extends into her buttocks.  She had denied any recent falls.  Recent imaging showed severe central canal stenosis at C4-5 C5-6 with foraminal stenosis from C3-7.  Most recent preoperative labs showed a hemoglobin of 12.8 hematocrit 40.3 on 03/03/2024 and unremarkable chemistries.  Underwent posterior segmental instrumentation of C3-7 as well as posterior lateral arthrodesis from C5-7 with cervical laminectomy from C4-C6 for decompression of the spinal cord with bilateral foraminotomies and facetectomies at C3/4 C4-5 C5-6 and left foraminotomies C6-7 03/15/2024 per Dr. Reeves Nine.  Laced in a cervical brace for comfort.  She did complete a course of Decadron  taper.  Patient control with pain with the use of Dilaudid  as well as Valium .  Lovenox  added for DVT prophylaxis 03/16/2024.  Follow-up imaging CT/MRI cervical spine 03/16/2024 due to  complaints of increasing neck pain and acute left arm weakness showed noted degenerative changes moderate to severe spinal canal stenosis C3-4 with mild cord compression without definitive cord signal abnormality.  Multilevel foraminal stenosis greatest and severe bilaterally at C3-4 C5-6 on the left at C6-7.  Moderate to severe spinal canal stenosis on the right at C6-7.  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  Therapy evaluations completed due to patient decreased functional mobility was admitted for a comprehensive rehab program.   PRECAUTIONS: Cervical,   WEIGHT BEARING RESTRICTIONS: Yes ; no lifting >10 lbs  PAIN: 06/23/24: no pain, just stiffness Are you having pain? Yes: NPRS scale: 4/10 pain at rest, 8/10 with activity Pain location: neck, shoulders down to hands Pain description: nerve pain Aggravating factors: walking, sitting, standing, moving L arm  Relieving factors: reclining  FALLS: Has patient fallen in last 6 months? 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   OBJECTIVE:  Note: Objective measures were completed at Evaluation unless otherwise noted.  HAND DOMINANCE: Right  ADLs: Overall ADLs: Caregiver assist for ADLs Transfers/ambulation related to ADLs: using rollator at home for balance and to  take pressure off shoulders and back Eating: difficulty cutting food Grooming: Max A with hair care d/t BUE weakness UB Dressing: Limited engagement of LUE LB Dressing: Difficulty hiking pants d/t limited engagement of the LUE Toileting: modified indep using R dominant/less affected arm Bathing: supv for tub/shower transfers; assist to wash upper R side of body; reports her long handled sponge is ineffective Tub Shower transfers: transfer tub bench in place but able to step over shower with supv and sits on bench prn throughout shower Equipment: Transfer tub bench  IADLs: Shopping: dep Light housekeeping: Max A; pt reports she can load washing machine but is unable to load/unload dishwasher d/t the bending required Meal Prep: Partner managing meals Community mobility: increased pain/limited Medication management: indep Financial management: indep  POSTURE COMMENTS:  rounded shoulders and forward head  ACTIVITY TOLERANCE: Activity tolerance: Limited tolerance for sitting and standing; more comfortable in supine (performed eval partially supine on mat to manage pain)  UPPER EXTREMITY ROM:    Active ROM Right eval Left eval Left 05/11/24  Shoulder flexion 55 30 45  Shoulder abduction 55 20 20  Shoulder adduction     Shoulder extension     Shoulder internal rotation WNL Unable to attempt behind back Pt crawls fingers around hip and buttocks  Shoulder external rotation -20 Unable to attempt behind head Unable to attempt behind head against gravity  Elbow flexion     Elbow extension     Wrist flexion     Wrist extension     Wrist ulnar deviation     Wrist radial deviation     Wrist pronation     Wrist supination     (Blank rows = not tested)  UPPER EXTREMITY MMT:     MMT Right eval R 05/13/24 Left eval L 05/13/24  Shoulder flexion 2+ 3+/5 2- 2-/5  Shoulder abduction 2+ 2+/5 2- 2-/5  Shoulder adduction      Shoulder extension      Shoulder internal rotation 4 4 2+ 3-/5   Shoulder external rotation 3- 3+ 2 2/5  Middle trapezius      Lower trapezius      Elbow flexion 4+ 5 1 to 2- (will assess more thoroughly next session) 2-/5  Elbow extension 5  4 4+/5  Wrist flexion 5  4 5   Wrist extension 5  4 5   Wrist ulnar deviation      Wrist radial deviation      Wrist pronation 5  3+ 5/5  Wrist supination 5  3- 4/5  (Blank rows = not tested)  HAND FUNCTION: Grip strength: Right: 68 lbs; Left: 34 lbs, Lateral pinch: Right: 13 lbs, Left: 7 lbs, and 3 point pinch: Right: 8 lbs, Left: 5 lbs 05/13/24: Grip strength: Right: 67 lbs; Left: 55 lbs, Lateral pinch: Right: 16 lbs, Left: 12 lbs, and 3 point pinch: Right: 13 lbs, Left: 13 lbs  COORDINATION: 9 Hole Peg test: Right: 24 sec; Left: 47 sec  05/23/24: 9 Hole Peg test: Right: 25  sec; Left: 47 sec  SENSATION: tingling/numbness in L hand digits 1-3, diminished up the L arm, no tingling in the RUE  COGNITION: Overall cognitive status: Within functional limits for tasks assessed  VISION: WNL  OBSERVATIONS:  Pt visibly in pain in with facial grimacing during shoulder shrugs  NEXT MD APPT: Return to neuro surgeon Sept.                                                                                                                            TREATMENT DATE: 06/23/24  Therapeutic Exercise: BUE strengthening -Supine passive stretching to reduce L shoulder stiffness; OT provided proximal and distal support d/t weakness to control end ranges for all shoulder planes of movement -L scapular ROM with passive depression, retraction  Self Care: -Review of HEP, specifically AAROM for pt to perform self ROM for bilat shoulder abd and ER with review of grading techniques to increase/decrease end ranges with and without pillows beneath head and upper arm in supine.  PATIENT EDUCATION: Education details: HEP review Person educated: Patient Education method: Programmer, multimedia, Demonstration, and Verbal cues Education  comprehension: verbalized understanding, returned demonstration, verbal cues required, tactile cues required, and needs further education  HOME EXERCISE PROGRAM: 04/26/24:  HEP through [employer]: -AAROM at the tabletop for internal, and external rotation. -Shoulder flexion/extension AAROM with dowel/cane base blocked at the wall -Shoulder circles AAROM with the dowel/cane into wall- with base blacked at the wall -Elbow flexion, extension extension AAROM with dowel/cane into wall with base of dowel/cane supported at the wall.  04/20/24:-LUE AAROM/AROM to tolerance and within cervical precautions; shoulder shrugs and retraction in front of mirror, theraputty 04/15/24: -positioning for comfort 04/13/24: -Bilateral AAROM Shoulder flexion and abduction tabletop exercises while seated. -AROM, and resistive forearm supination sitting with the LUE supported on pillows. -Supine left elbow flexion in gravity eliminated plane, progressing to partial gravity with resistance. 04/08/24: Refer to treatment note for HEP details.  GOALS: Goals reviewed with patient? Yes  SHORT TERM GOALS: Target date: 05/18/24  Pt will be indep to perform HEP for improving BUE strength/flexibility/coordination for daily tasks. Baseline: Eval: Not yet initiated. 05/13/24: performs HEP with handout Goal status: Achieved  LONG TERM GOALS: Target date: 06/29/24  Pt will increase L grip strength by 20 lbs or more in order to improve ability to grasp and carry heavier ADL supplies.  Baseline: Eval: L grip 34 lbs (R 68 lbs). 05/13/24: L grip 55 lbs Goal status: Achieved  2.  Pt will increase L lateral pinch strength by 3 or more lbs to maintain grasp of fork for independence with cutting food. Baseline: Eval: L 7 lbs (R 13 lbs). 05/13/24: L 12 lbs Goal status: Achieved  3.  Pt will increase R shoulder and LUE strength by 1 MM grade or more to better engage BUEs into ADLs.  Baseline: Eval: See above MMT; pt must use R hand to lift  LUE up from lap to table top; L shoulder  mobility BFL, R limited below 90*.  Pt predominately performing ADLs with R dominant arm. 1/85/74: Achieved with R hand improving as seen above, L wrist achieved, L shoulder remains the same.  Goal status: Ongoing  4.  Pt will increase L hand FMC skills to manage clothing fasteners independently and manipulate small ADL supplies with reduced dropping.  Baseline: Eval: L 9 hole peg test 47 sec (R 24 sec). 05/13/24: L 47 sec.  Goal status: Ongoing  5.  Pt will tolerate manual therapy, therapeutic modalities, and exercises to decrease pain in bilat shoulders/LUE to a reported 5/10 pain or less with activity.  Baseline: Eval: up to 10/10 pain with activity. 05/13/24: 1/20 at rest, 5/10 with activity.  Goal status: Achieved  ASSESSMENT: CLINICAL IMPRESSION: No pain reported today, only stiffness.  Pt tolerated passive stretching and AAROM with cane well today to target L shoulder stiffness, reporting improved comfort/reduced stiffness at end of session.  Shortened tx today as pt arrived late and left early.  Pt reports next EMG is scheduled for 07/05/24.  Pt will continue to benefit from strengthening accessory musculature surrounding L deltoid to maximize functional use of LUE, and continue to target RUE weakness for same.  Pt. continues to benefit from skilled OT to work towards above noted goals in OT poc in order to maximize indep with daily tasks.   PERFORMANCE DEFICITS: in functional skills including ADLs, IADLs, coordination, dexterity, sensation, ROM, strength, pain, flexibility, Fine motor control, Gross motor control, mobility, balance, endurance, decreased knowledge of precautions, decreased knowledge of use of DME, skin integrity, and UE functional use, cognitive skills including emotional, and psychosocial skills including coping strategies, environmental adaptation, habits, and routines and behaviors.   IMPAIRMENTS: are limiting patient from ADLs,  IADLs, rest and sleep, work, leisure, and social participation.   CO-MORBIDITIES: has co-morbidities such as anxiety/depression, HTN, asthma, DM2, DDD (lumbar) that affects occupational performance. Patient will benefit from skilled OT to address above impairments and improve overall function.  MODIFICATION OR ASSISTANCE TO COMPLETE EVALUATION: Min-Moderate modification of tasks or assist with assess necessary to complete an evaluation.  OT OCCUPATIONAL PROFILE AND HISTORY: Detailed assessment: Review of records and additional review of physical, cognitive, psychosocial history related to current functional performance.  CLINICAL DECISION MAKING: Moderate - several treatment options, min-mod task modification necessary  REHAB POTENTIAL: Good  EVALUATION COMPLEXITY: Moderate  PLAN:  OT FREQUENCY: 2x/week  OT DURATION: 12 weeks  PLANNED INTERVENTIONS: 97168 OT Re-evaluation, 97535 self care/ADL training, 02889 therapeutic exercise, 97530 therapeutic activity, 97112 neuromuscular re-education, 97140 manual therapy, 97116 gait training, 02989 moist heat, 97010 cryotherapy, 97034 contrast bath, 97750 Physical Performance Testing, 02239 Orthotic Initial, 97763 Orthotic/Prosthetic subsequent, passive range of motion, balance training, functional mobility training, psychosocial skills training, energy conservation, coping strategies training, patient/family education, and DME and/or AE instructions  RECOMMENDED OTHER SERVICES: None at this time  CONSULTED AND AGREED WITH PLAN OF CARE: Patient and family member/caregiver  PLAN FOR NEXT SESSION: see above  Inocente Blazing, MS, OTR/L  06/23/2024, 1:20 PM

## 2024-06-23 NOTE — Telephone Encounter (Signed)
 [facility]  06/23/2024 3:23 PM  Confirmed I was speaking with Vennie C Belkin [id number] by using name and DOB. Informed participant the reason for this call is to follow-up on a recent sample the participant provided at one of the [facility] lab locations. Informed participant the test was not able to be completed with this sample and apologized for the inconvenience. Participant was requested to provide a new sample at one of our participating labs at no cost so that participant can continue participation and receive test results. Informed participant they do not need to be fasting and if there are other samples that need to be drawn, they can be done at the same visit. Participant has not had a blood transfusion or blood product in the last 30 days. Participant agreed to provide another sample. Participant was provided the [employer] program website to learn why this may have happened. Participant was thanked for their time and continued support of the above study.    Jordyn Pennstrom, BS [place]  [facility] Clinical Research Specialist II Direct Dial: [phone]  Fax: [phone]

## 2024-06-24 ENCOUNTER — Ambulatory Visit

## 2024-06-24 ENCOUNTER — Encounter

## 2024-06-25 ENCOUNTER — Encounter: Admitting: Physical Medicine & Rehabilitation

## 2024-06-29 ENCOUNTER — Ambulatory Visit: Admitting: Occupational Therapy

## 2024-06-29 ENCOUNTER — Ambulatory Visit

## 2024-06-29 NOTE — Telephone Encounter (Signed)
 Pt is wanting to know about any updates on her surgery.

## 2024-06-30 ENCOUNTER — Ambulatory Visit: Attending: Physician Assistant

## 2024-06-30 DIAGNOSIS — R278 Other lack of coordination: Secondary | ICD-10-CM | POA: Diagnosis present

## 2024-06-30 DIAGNOSIS — M6281 Muscle weakness (generalized): Secondary | ICD-10-CM | POA: Insufficient documentation

## 2024-06-30 DIAGNOSIS — G959 Disease of spinal cord, unspecified: Secondary | ICD-10-CM | POA: Diagnosis present

## 2024-07-01 ENCOUNTER — Ambulatory Visit

## 2024-07-01 ENCOUNTER — Encounter

## 2024-07-01 NOTE — Therapy (Signed)
 OUTPATIENT OCCUPATIONAL THERAPY NEURO TREATMENT NOTE  Patient Name: Eileen Chaney MRN: [id number] DOB:Jan 06, 1976, 48 y.o., female Today's Date: 07/01/2024  PCP: Dr. Alm Needle REFERRING PROVIDER: Toribio Pitch, PA-C  END OF SESSION:  OT End of Session - 07/01/24 1622     Visit Number 17    Number of Visits 24    Date for Recertification  06/29/24    OT Start Time 1451    OT Stop Time 1535    OT Time Calculation (min) 44 min    Activity Tolerance Patient tolerated treatment well    Behavior During Therapy WFL for tasks assessed/performed         Past Medical History:  Diagnosis Date   Anxiety    Arthritis    Asthma    Cervical myelopathy with cervical radiculopathy (HCC)    Complication of anesthesia    with c sections had a had time getting her sedated   DDD (degenerative disc disease), cervical    Depression    Diabetes mellitus without complication (HCC)    type 2   Family history of adverse reaction to anesthesia    mother hard to wake up   Fibromyalgia    Hyperlipidemia    Hypertension    Idiopathic urticaria    Pneumonia    PONV (postoperative nausea and vomiting)    Undifferentiated inflammatory arthritis    Past Surgical History:  Procedure Laterality Date   ABDOMINAL HYSTERECTOMY  2018   uterine fibroids   ANKLE SURGERY Right 2022   ligament repair   ANTERIOR CERVICAL DECOMP/DISCECTOMY FUSION N/A 11/12/2021   Procedure: C3-5 ANTERIOR CERVICAL DISCECTOMY AND FUSION (GLOBUS HEDRON);  Surgeon: Clois Fret, MD;  Location: [facility];  Service: Neurosurgery;  Laterality: N/A;   CERVICAL WOUND DEBRIDEMENT N/A 01/20/2022   Procedure: posterior wound debridment;  Surgeon: Clois Fret, MD;  Location: [facility];  Service: Neurosurgery;  Laterality: N/A;   CESAREAN SECTION     X3 2000, 2001, 2009   DILATION AND CURETTAGE OF UTERUS     POSTERIOR CERVICAL FUSION/FORAMINOTOMY N/A 03/15/2024   Procedure: POSTERIOR CERVICAL FUSION/FORAMINOTOMY  LEVEL 4;  Surgeon: Clois Fret, MD;  Location: [facility];  Service: Neurosurgery;  Laterality: N/A;  C4-7 Posterior Spinal Decompression C4-7 Posterior Spinal Instrumentation C5-7 Fusion   POSTERIOR CERVICAL LAMINECTOMY N/A 01/07/2022   Procedure: OPEN C5-7 POSTERIOR DECOMPRESSION;  Surgeon: Clois Fret, MD;  Location: [facility];  Service: Neurosurgery;  Laterality: N/A;   TONSILLECTOMY  2000   WISDOM TOOTH EXTRACTION     Patient Active Problem List   Diagnosis Date Noted   Left arm weakness 06/14/2024   Brachial plexus lesion 06/14/2024   Anxiety with depression 03/24/2024   Cervical myelopathy (HCC) 03/15/2024   Spinal stenosis in cervical region 03/15/2024   Cervical radiculopathy 03/15/2024   S/P cervical spinal fusion 03/15/2024   Wound infection after surgery 01/20/2022   Gestational diabetes 06/02/2020   Arthritis 10/11/2019   DDD (degenerative disc disease), lumbar 10/11/2019   Hyperlipidemia 10/11/2019   Idiopathic urticaria 10/11/2019   Recurrent major depressive disorder, in partial remission 10/11/2019   Spinal stenosis of lumbar region without neurogenic claudication 10/11/2019   Type 2 diabetes mellitus without complication, with long-term current use of insulin  (HCC) 10/11/2019   Elevated lactic acid level 09/29/2017   Leukocytosis 09/29/2017   Diabetes (HCC) 09/28/2017   Hypertension 09/28/2017   Mild asthma without complication 09/28/2017   Morbid obesity (HCC) 09/28/2017   Thalassemia 09/28/2017   GAD (generalized anxiety disorder) 07/25/2014  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, unspecified 03/14/2014   ONSET DATE: 03/15/24  REFERRING DIAG:  G95.9 (ICD-10-CM) - Cervical myelopathy (HCC)  Z98.1 (ICD-10-CM) - S/P cervical spinal fusion   THERAPY DIAG:  Muscle weakness (generalized)  Other lack of coordination  Cervical myelopathy (HCC)  Rationale for Evaluation and Treatment: Rehabilitation  SUBJECTIVE:  SUBJECTIVE  STATEMENT: Pt reports some improvements in hiking her pants, demonstrating increased reach behind her back with the LUE. Pt accompanied by: self  PERTINENT HISTORY: Per chart: CIR d/c summary: Eileen Chaney is a 48 y.o. right-handed female with history significant for anxiety/depression as well as component of ADHD maintained on Ritalin , asthma, class I obesity with BMI 35.28 diabetes mellitus fibromyalgia hyperlipidemia hypertension posterior cervical laminectomy 01/07/2022, anterior cervical decompressive discectomy fusion 11/12/2021 complicated by cervical wound debridement 01/20/2022.  Per chart review patient lives with daughter.  She has good support from her family.  Two-level home bed and bath on main level independent community ambulator.  [facility] with increasing difficulty with dexterity in her hands as well as poor balance.  She was having trouble using her phone as well as difficulty with buttons and clasping her necklaces.  She continued to have ongoing neck and left arm and shoulder pain with numbness as well as low back pain that extends into her buttocks.  She had denied any recent falls.  Recent imaging showed severe central canal stenosis at C4-5 C5-6 with foraminal stenosis from C3-7.  Most recent preoperative labs showed a hemoglobin of 12.8 hematocrit 40.3 on 03/03/2024 and unremarkable chemistries.  Underwent posterior segmental instrumentation of C3-7 as well as posterior lateral arthrodesis from C5-7 with cervical laminectomy from C4-C6 for decompression of the spinal cord with bilateral foraminotomies and facetectomies at C3/4 C4-5 C5-6 and left foraminotomies C6-7 03/15/2024 per Dr. Reeves Nine.  Laced in a cervical brace for comfort.  She did complete a course of Decadron  taper.  Patient control with pain with the use of Dilaudid  as well as Valium .  Lovenox  added for DVT prophylaxis 03/16/2024.  Follow-up imaging CT/MRI cervical spine 03/16/2024 due to  complaints of increasing neck pain and acute left arm weakness showed noted degenerative changes moderate to severe spinal canal stenosis C3-4 with mild cord compression without definitive cord signal abnormality.  Multilevel foraminal stenosis greatest and severe bilaterally at C3-4 C5-6 on the left at C6-7.  Moderate to severe spinal canal stenosis on the right at C6-7.  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  Therapy evaluations completed due to patient decreased functional mobility was admitted for a comprehensive rehab program.   PRECAUTIONS: Cervical,   WEIGHT BEARING RESTRICTIONS: Yes ; no lifting >10 lbs  PAIN: 06/30/24: no pain, just stiffness Are you having pain? Yes: NPRS scale: 4/10 pain at rest, 8/10 with activity Pain location: neck, shoulders down to hands Pain description: nerve pain Aggravating factors: walking, sitting, standing, moving L arm  Relieving factors: reclining  FALLS: Has patient fallen in last 6 months? 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   OBJECTIVE:  Note: Objective measures were completed at Evaluation unless otherwise noted.  HAND DOMINANCE: Right  ADLs: Overall ADLs: Caregiver assist for ADLs Transfers/ambulation related to ADLs: using rollator at home for balance and to  take pressure off shoulders and back Eating: difficulty cutting food Grooming: Max A with hair care d/t BUE weakness UB Dressing: Limited engagement of LUE LB Dressing: Difficulty hiking pants d/t limited engagement of the LUE Toileting: modified indep using R dominant/less affected arm Bathing: supv for tub/shower transfers; assist to wash upper R side of body; reports her long handled sponge is ineffective Tub Shower transfers: transfer tub bench in place but able to step over shower with supv and sits on bench prn throughout shower Equipment: Transfer tub bench  IADLs: Shopping: dep Light housekeeping: Max A; pt reports she can load washing machine but is unable to load/unload dishwasher d/t the bending required Meal Prep: Partner managing meals Community mobility: increased pain/limited Medication management: indep Financial management: indep  POSTURE COMMENTS:  rounded shoulders and forward head  ACTIVITY TOLERANCE: Activity tolerance: Limited tolerance for sitting and standing; more comfortable in supine (performed eval partially supine on mat to manage pain)  UPPER EXTREMITY ROM:    Active ROM Right eval Left eval Left 05/11/24  Shoulder flexion 55 30 45  Shoulder abduction 55 20 20  Shoulder adduction     Shoulder extension     Shoulder internal rotation WNL Unable to attempt behind back Pt crawls fingers around hip and buttocks  Shoulder external rotation -20 Unable to attempt behind head Unable to attempt behind head against gravity  Elbow flexion     Elbow extension     Wrist flexion     Wrist extension     Wrist ulnar deviation     Wrist radial deviation     Wrist pronation     Wrist supination     (Blank rows = not tested)  UPPER EXTREMITY MMT:     MMT Right eval R 05/13/24 Left eval L 05/13/24  Shoulder flexion 2+ 3+/5 2- 2-/5  Shoulder abduction 2+ 2+/5 2- 2-/5  Shoulder adduction      Shoulder extension      Shoulder internal rotation 4 4 2+ 3-/5   Shoulder external rotation 3- 3+ 2 2/5  Middle trapezius      Lower trapezius      Elbow flexion 4+ 5 1 to 2- (will assess more thoroughly next session) 2-/5  Elbow extension 5  4 4+/5  Wrist flexion 5  4 5   Wrist extension 5  4 5   Wrist ulnar deviation      Wrist radial deviation      Wrist pronation 5  3+ 5/5  Wrist supination 5  3- 4/5  (Blank rows = not tested)  HAND FUNCTION: Grip strength: Right: 68 lbs; Left: 34 lbs, Lateral pinch: Right: 13 lbs, Left: 7 lbs, and 3 point pinch: Right: 8 lbs, Left: 5 lbs 05/13/24: Grip strength: Right: 67 lbs; Left: 55 lbs, Lateral pinch: Right: 16 lbs, Left: 12 lbs, and 3 point pinch: Right: 13 lbs, Left: 13 lbs  COORDINATION: 9 Hole Peg test: Right: 24 sec; Left: 47 sec  05/23/24: 9 Hole Peg test: Right: 25 sec; Left: 47 sec  SENSATION: tingling/numbness in L hand digits 1-3, diminished up the L arm, no tingling in the RUE  COGNITION: Overall cognitive status: Within functional limits for tasks assessed  VISION: WNL  OBSERVATIONS:  Pt visibly in pain in with facial grimacing during shoulder shrugs  NEXT MD APPT: Return to neuro surgeon Sept.                                                                                                                            TREATMENT DATE: 06/30/24  Therapeutic Exercise: BUE strengthening -Sci-fit Level 2.5 x6 min, alternating between forward and reverse rotations for BUE strengthening. -Supine passive stretching to reduce L shoulder stiffness; OT provided proximal and distal support d/t weakness to control end ranges for all shoulder planes of movement -L scapular ROM with passive depression -Instructed in/completed side lying bicep curls, with rolled towel between upper arm and torso to decrease IR with each curl -Pron/sup with mallet, forearm resting on table top, OT providing light resistance on end of mallet to increase challenge in each direction -Towel slide on table top for active assisted  ER with elbow stabilized on table top   Self Care: -Review of ADL routines/LUE supportive positioning techniques for hair care.   -Review of HEP additions for strengthening L shoulder ER, elbow flexion, and forearm supination to improve hand to mouth patterns without L arm falling into abdomen and chest.  PATIENT EDUCATION: Education details: HEP progression Person educated: Patient Education method: Programmer, multimedia, Demonstration, and Verbal cues Education comprehension: verbalized understanding, returned demonstration, verbal cues required, tactile cues required, and needs further education  HOME EXERCISE PROGRAM: 04/26/24:  HEP through [employer]: -AAROM at the tabletop for internal, and external rotation. -Shoulder flexion/extension AAROM with dowel/cane base blocked at the wall -Shoulder circles AAROM with the dowel/cane into wall- with base blacked at the wall -Elbow flexion, extension extension AAROM with dowel/cane into wall with base of dowel/cane supported at the wall.  04/20/24:-LUE AAROM/AROM to tolerance and within cervical precautions; shoulder shrugs and retraction in front of mirror, theraputty 04/15/24: -positioning for comfort 04/13/24: -Bilateral AAROM Shoulder flexion and abduction tabletop exercises while seated. -AROM, and resistive forearm supination sitting with the LUE supported on pillows. -Supine left elbow flexion in gravity eliminated plane, progressing to partial gravity with resistance. 04/08/24: Refer to treatment note for HEP details.  GOALS: Goals reviewed with patient? Yes  SHORT TERM GOALS: Target date: 05/18/24  Pt will be indep to perform HEP for improving BUE strength/flexibility/coordination for daily tasks. Baseline: Eval: Not yet initiated. 05/13/24: performs HEP with handout Goal status: Achieved  LONG TERM GOALS: Target date: 06/29/24  Pt will increase L grip strength by 20 lbs or more in order to improve ability to grasp and carry heavier ADL  supplies.  Baseline: Eval: L grip 34 lbs (R 68 lbs). 05/13/24: L grip 55 lbs Goal status: Achieved  2.  Pt will increase L lateral pinch strength by 3 or more lbs to  maintain grasp of fork for independence with cutting food. Baseline: Eval: L 7 lbs (R 13 lbs). 05/13/24: L 12 lbs Goal status: Achieved  3.  Pt will increase R shoulder and LUE strength by 1 MM grade or more to better engage BUEs into ADLs.  Baseline: Eval: See above MMT; pt must use R hand to lift LUE up from lap to table top; L shoulder mobility BFL, R limited below 90*.  Pt predominately performing ADLs with R dominant arm. 1/85/74: Achieved with R hand improving as seen above, L wrist achieved, L shoulder remains the same.  Goal status: Ongoing  4.  Pt will increase L hand FMC skills to manage clothing fasteners independently and manipulate small ADL supplies with reduced dropping.  Baseline: Eval: L 9 hole peg test 47 sec (R 24 sec). 05/13/24: L 47 sec.  Goal status: Ongoing  5.  Pt will tolerate manual therapy, therapeutic modalities, and exercises to decrease pain in bilat shoulders/LUE to a reported 5/10 pain or less with activity.  Baseline: Eval: up to 10/10 pain with activity. 05/13/24: 1/20 at rest, 5/10 with activity.  Goal status: Achieved  ASSESSMENT: CLINICAL IMPRESSION: EMG scheduled for Mon 07/05/24.  No pain reported today, only stiffness.  Good tolerance to above noted Bue and LUE exercises this date.  Added modifications to strengthening exercises for L shoulder ER, bicep curls, and supination to increase challenge and/or reduce substitution patterns.  These movement planes targeted to improve hand to mouth patterns for self feeding as pt reports that L arm internally rotates into chest/abdomen when bringing hand to mouth, and pt reports difficulty cutting food d/t having to position L forearm in pronation while holding fork d/t weak supinators.  Pt will continue to benefit from strengthening accessory musculature  surrounding L deltoid to maximize functional use of LUE, and continue to target RUE weakness for same.  Pt. continues to benefit from skilled OT to work towards above noted goals in OT poc in order to maximize indep with daily tasks.   PERFORMANCE DEFICITS: in functional skills including ADLs, IADLs, coordination, dexterity, sensation, ROM, strength, pain, flexibility, Fine motor control, Gross motor control, mobility, balance, endurance, decreased knowledge of precautions, decreased knowledge of use of DME, skin integrity, and UE functional use, cognitive skills including emotional, and psychosocial skills including coping strategies, environmental adaptation, habits, and routines and behaviors.   IMPAIRMENTS: are limiting patient from ADLs, IADLs, rest and sleep, work, leisure, and social participation.   CO-MORBIDITIES: has co-morbidities such as anxiety/depression, HTN, asthma, DM2, DDD (lumbar) that affects occupational performance. Patient will benefit from skilled OT to address above impairments and improve overall function.  MODIFICATION OR ASSISTANCE TO COMPLETE EVALUATION: Min-Moderate modification of tasks or assist with assess necessary to complete an evaluation.  OT OCCUPATIONAL PROFILE AND HISTORY: Detailed assessment: Review of records and additional review of physical, cognitive, psychosocial history related to current functional performance.  CLINICAL DECISION MAKING: Moderate - several treatment options, min-mod task modification necessary  REHAB POTENTIAL: Good  EVALUATION COMPLEXITY: Moderate  PLAN:  OT FREQUENCY: 2x/week  OT DURATION: 12 weeks  PLANNED INTERVENTIONS: 97168 OT Re-evaluation, 97535 self care/ADL training, 02889 therapeutic exercise, 97530 therapeutic activity, 97112 neuromuscular re-education, 97140 manual therapy, 97116 gait training, 02989 moist heat, 97010 cryotherapy, 97034 contrast bath, 97750 Physical Performance Testing, 02239 Orthotic Initial, [id number]  Orthotic/Prosthetic subsequent, passive range of motion, balance training, functional mobility training, psychosocial skills training, energy conservation, coping strategies training, patient/family education, and DME and/or AE instructions  RECOMMENDED  OTHER SERVICES: None at this time  CONSULTED AND AGREED WITH PLAN OF CARE: Patient and family member/caregiver  PLAN FOR NEXT SESSION: see above  Inocente Blazing, MS, OTR/L  07/01/2024, 4:24 PM

## 2024-07-05 ENCOUNTER — Ambulatory Visit

## 2024-07-05 ENCOUNTER — Encounter: Payer: Self-pay | Admitting: Neurosurgery

## 2024-07-05 DIAGNOSIS — M6281 Muscle weakness (generalized): Secondary | ICD-10-CM | POA: Diagnosis not present

## 2024-07-05 DIAGNOSIS — R278 Other lack of coordination: Secondary | ICD-10-CM

## 2024-07-05 DIAGNOSIS — G959 Disease of spinal cord, unspecified: Secondary | ICD-10-CM

## 2024-07-05 NOTE — Therapy (Addendum)
 OUTPATIENT OCCUPATIONAL THERAPY NEURO RECERTIFICATION NOTE  Patient Name: Eileen Chaney MRN: [id number] DOB:05-Jun-1976, 48 y.o., female Today's Date: 07/05/2024  PCP: Dr. Alm Needle REFERRING PROVIDER: Toribio Pitch, PA-C  END OF SESSION:  OT End of Session - 07/05/24 1123     Visit Number 18    Number of Visits 41    Date for Recertification  09/27/24    OT Start Time 0933    OT Stop Time 1015    OT Time Calculation (min) 42 min    Activity Tolerance Patient tolerated treatment well    Behavior During Therapy WFL for tasks assessed/performed         Past Medical History:  Diagnosis Date   Anxiety    Arthritis    Asthma    Cervical myelopathy with cervical radiculopathy (HCC)    Complication of anesthesia    with c sections had a had time getting her sedated   DDD (degenerative disc disease), cervical    Depression    Diabetes mellitus without complication (HCC)    type 2   Family history of adverse reaction to anesthesia    mother hard to wake up   Fibromyalgia    Hyperlipidemia    Hypertension    Idiopathic urticaria    Pneumonia    PONV (postoperative nausea and vomiting)    Undifferentiated inflammatory arthritis    Past Surgical History:  Procedure Laterality Date   ABDOMINAL HYSTERECTOMY  2018   uterine fibroids   ANKLE SURGERY Right 2022   ligament repair   ANTERIOR CERVICAL DECOMP/DISCECTOMY FUSION N/A 11/12/2021   Procedure: C3-5 ANTERIOR CERVICAL DISCECTOMY AND FUSION (GLOBUS HEDRON);  Surgeon: Clois Fret, MD;  Location: [facility];  Service: Neurosurgery;  Laterality: N/A;   CERVICAL WOUND DEBRIDEMENT N/A 01/20/2022   Procedure: posterior wound debridment;  Surgeon: Clois Fret, MD;  Location: [facility];  Service: Neurosurgery;  Laterality: N/A;   CESAREAN SECTION     X3 2000, 2001, 2009   DILATION AND CURETTAGE OF UTERUS     POSTERIOR CERVICAL FUSION/FORAMINOTOMY N/A 03/15/2024   Procedure: POSTERIOR CERVICAL  FUSION/FORAMINOTOMY LEVEL 4;  Surgeon: Clois Fret, MD;  Location: [facility];  Service: Neurosurgery;  Laterality: N/A;  C4-7 Posterior Spinal Decompression C4-7 Posterior Spinal Instrumentation C5-7 Fusion   POSTERIOR CERVICAL LAMINECTOMY N/A 01/07/2022   Procedure: OPEN C5-7 POSTERIOR DECOMPRESSION;  Surgeon: Clois Fret, MD;  Location: [facility];  Service: Neurosurgery;  Laterality: N/A;   TONSILLECTOMY  2000   WISDOM TOOTH EXTRACTION     Patient Active Problem List   Diagnosis Date Noted   Left arm weakness 06/14/2024   Brachial plexus lesion 06/14/2024   Anxiety with depression 03/24/2024   Cervical myelopathy (HCC) 03/15/2024   Spinal stenosis in cervical region 03/15/2024   Cervical radiculopathy 03/15/2024   S/P cervical spinal fusion 03/15/2024   Wound infection after surgery 01/20/2022   Gestational diabetes 06/02/2020   Arthritis 10/11/2019   DDD (degenerative disc disease), lumbar 10/11/2019   Hyperlipidemia 10/11/2019   Idiopathic urticaria 10/11/2019   Recurrent major depressive disorder, in partial remission 10/11/2019   Spinal stenosis of lumbar region without neurogenic claudication 10/11/2019   Type 2 diabetes mellitus without complication, with long-term current use of insulin  (HCC) 10/11/2019   Elevated lactic acid level 09/29/2017   Leukocytosis 09/29/2017   Diabetes (HCC) 09/28/2017   Hypertension 09/28/2017   Mild asthma without complication 09/28/2017   Morbid obesity (HCC) 09/28/2017   Thalassemia 09/28/2017   GAD (generalized anxiety disorder) 07/25/2014  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, unspecified 03/14/2014   ONSET DATE: 03/15/24  REFERRING DIAG:  G95.9 (ICD-10-CM) - Cervical myelopathy (HCC)  Z98.1 (ICD-10-CM) - S/P cervical spinal fusion   THERAPY DIAG:  Muscle weakness (generalized)  Other lack of coordination  Cervical myelopathy (HCC)  Rationale for Evaluation and Treatment:  Rehabilitation  SUBJECTIVE:  SUBJECTIVE STATEMENT: Pt has her EMG scheduled for this afternoon. Pt accompanied by: self  PERTINENT HISTORY: Per chart: CIR d/c summary: Eileen Chaney is a 48 y.o. right-handed female with history significant for anxiety/depression as well as component of ADHD maintained on Ritalin , asthma, class I obesity with BMI 35.28 diabetes mellitus fibromyalgia hyperlipidemia hypertension posterior cervical laminectomy 01/07/2022, anterior cervical decompressive discectomy fusion 11/12/2021 complicated by cervical wound debridement 01/20/2022.  Per chart review patient lives with daughter.  She has good support from her family.  Two-level home bed and bath on main level independent community ambulator.  [facility] with increasing difficulty with dexterity in her hands as well as poor balance.  She was having trouble using her phone as well as difficulty with buttons and clasping her necklaces.  She continued to have ongoing neck and left arm and shoulder pain with numbness as well as low back pain that extends into her buttocks.  She had denied any recent falls.  Recent imaging showed severe central canal stenosis at C4-5 C5-6 with foraminal stenosis from C3-7.  Most recent preoperative labs showed a hemoglobin of 12.8 hematocrit 40.3 on 03/03/2024 and unremarkable chemistries.  Underwent posterior segmental instrumentation of C3-7 as well as posterior lateral arthrodesis from C5-7 with cervical laminectomy from C4-C6 for decompression of the spinal cord with bilateral foraminotomies and facetectomies at C3/4 C4-5 C5-6 and left foraminotomies C6-7 03/15/2024 per Dr. Reeves Nine.  Laced in a cervical brace for comfort.  She did complete a course of Decadron  taper.  Patient control with pain with the use of Dilaudid  as well as Valium .  Lovenox  added for DVT prophylaxis 03/16/2024.  Follow-up imaging CT/MRI cervical spine 03/16/2024 due to complaints of increasing neck  pain and acute left arm weakness showed noted degenerative changes moderate to severe spinal canal stenosis C3-4 with mild cord compression without definitive cord signal abnormality.  Multilevel foraminal stenosis greatest and severe bilaterally at C3-4 C5-6 on the left at C6-7.  Moderate to severe spinal canal stenosis on the right at C6-7.  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  Therapy evaluations completed due to patient decreased functional mobility was admitted for a comprehensive rehab program.   PRECAUTIONS: Cervical,   WEIGHT BEARING RESTRICTIONS: Yes ; no lifting >10 lbs  PAIN: 07/05/24: 3/10 pain LUE Are you having pain? Yes: NPRS scale: 4/10 pain at rest, 8/10 with activity Pain location: neck, shoulders down to hands Pain description: nerve pain Aggravating factors: walking, sitting, standing, moving L arm  Relieving factors: reclining  FALLS: Has patient fallen in last 6 months? 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   OBJECTIVE:  Note: Objective measures were completed at Evaluation unless otherwise noted.  HAND DOMINANCE: Right  ADLs: Overall ADLs: Caregiver assist for ADLs Transfers/ambulation related to ADLs: using rollator at home for balance and to take pressure off shoulders and  back Eating: difficulty cutting food Grooming: Max A with hair care d/t BUE weakness UB Dressing: Limited engagement of LUE LB Dressing: Difficulty hiking pants d/t limited engagement of the LUE Toileting: modified indep using R dominant/less affected arm Bathing: supv for tub/shower transfers; assist to wash upper R side of body; reports her long handled sponge is ineffective Tub Shower transfers: transfer tub bench in place but able to step over shower with supv and sits on bench prn throughout shower Equipment: Transfer tub bench  IADLs: Shopping: dep Light housekeeping: Max A; pt reports she can load washing machine but is unable to load/unload dishwasher d/t the bending required Meal Prep: Partner managing meals Community mobility: increased pain/limited Medication management: indep Financial management: indep  POSTURE COMMENTS:  rounded shoulders and forward head  ACTIVITY TOLERANCE: Activity tolerance: Limited tolerance for sitting and standing; more comfortable in supine (performed eval partially supine on mat to manage pain)  UPPER EXTREMITY ROM:    Active ROM Right eval Left eval Left 05/11/24  Shoulder flexion 55 30 45  Shoulder abduction 55 20 20  Shoulder adduction     Shoulder extension     Shoulder internal rotation WNL Unable to attempt behind back Pt crawls fingers around hip and buttocks  Shoulder external rotation -20 Unable to attempt behind head Unable to attempt behind head against gravity  Elbow flexion     Elbow extension     Wrist flexion     Wrist extension     Wrist ulnar deviation     Wrist radial deviation     Wrist pronation     Wrist supination     (Blank rows = not tested)  UPPER EXTREMITY MMT:     MMT Right eval R 05/13/24 Left eval L 05/13/24  Shoulder flexion 2+ 3+/5 2- 2-/5  Shoulder abduction 2+ 2+/5 2- 2-/5  Shoulder adduction      Shoulder extension      Shoulder internal rotation 4 4 2+ 3-/5  Shoulder external rotation 3-  3+ 2 2/5  Middle trapezius      Lower trapezius      Elbow flexion 4+ 5 1 to 2- (will assess more thoroughly next session) 2-/5  Elbow extension 5  4 4+/5  Wrist flexion 5  4 5   Wrist extension 5  4 5   Wrist ulnar deviation      Wrist radial deviation      Wrist pronation 5  3+ 5/5  Wrist supination 5  3- 4/5  (Blank rows = not tested)  HAND FUNCTION: Grip strength: Right: 68 lbs; Left: 34 lbs, Lateral pinch: Right: 13 lbs, Left: 7 lbs, and 3 point pinch: Right: 8 lbs, Left: 5 lbs 05/13/24: Grip strength: Right: 67 lbs; Left: 55 lbs, Lateral pinch: Right: 16 lbs, Left: 12 lbs, and 3 point pinch: Right: 13 lbs, Left: 13 lbs  COORDINATION: 9 Hole Peg test: Right: 24 sec; Left: 47 sec  05/23/24: 9 Hole Peg test: Right: 25 sec; Left: 47 sec  SENSATION: tingling/numbness in L hand digits 1-3, diminished up  the L arm, no tingling in the RUE  COGNITION: Overall cognitive status: Within functional limits for tasks assessed  VISION: WNL  OBSERVATIONS:  Pt visibly in pain in with facial grimacing during shoulder shrugs  NEXT MD APPT: Return to neuro surgeon Sept.                                                                                                                            TREATMENT DATE: 07/05/24  Therapeutic Exercise: BUE strengthening -Supine passive stretching to reduce L/R shoulder stiffness; OT provided proximal and distal support d/t weakness to control end ranges for all shoulder planes of movement -Supine passive median nerve stretch R and L (elbow and wrist extended, forearm supinated, thumb abducted)  -Completed side lying bicep curls x10, with folded pillow between upper arm and torso to decreased IR with each curl -Pron/sup with mallet, progressing to hammer (heavier), forearm resting on table top/armrest of chair; min vc to slow pace in each direction. 3 sets 10 reps each. -Towel slide on table top for active assisted ER with elbow stabilized on table top; min  tactile cues for positioning to reduce substitution patterns -Towel slide for L shoulder horiz abd/add and abd, using end of cane to provide increased end range stretch to tolerance (cane held in R hand for pt to passively stretch L); min vc for positioning to reduce substitution patterns.    Self Care: -Review of HEP additions for strengthening L shoulder ER, elbow flexion, and forearm supination to improve hand to mouth patterns without L arm falling into abdomen and chest. -Review of median nerve glides and flossing for LUE/RUE; issued visual handout and reviewed  PATIENT EDUCATION: Education details: HEP progression Person educated: Patient Education method: Programmer, multimedia, Demonstration, and Verbal cues Education comprehension: verbalized understanding, returned demonstration, verbal cues required, tactile cues required, and needs further education  HOME EXERCISE PROGRAM: 04/26/24:  HEP through [employer]: -AAROM at the tabletop for internal, and external rotation. -Shoulder flexion/extension AAROM with dowel/cane base blocked at the wall -Shoulder circles AAROM with the dowel/cane into wall- with base blacked at the wall -Elbow flexion, extension extension AAROM with dowel/cane into wall with base of dowel/cane supported at the wall.  04/20/24:-LUE AAROM/AROM to tolerance and within cervical precautions; shoulder shrugs and retraction in front of mirror, theraputty 04/15/24: -positioning for comfort 04/13/24: -Bilateral AAROM Shoulder flexion and abduction tabletop exercises while seated. -AROM, and resistive forearm supination sitting with the LUE supported on pillows. -Supine left elbow flexion in gravity eliminated plane, progressing to partial gravity with resistance. 04/08/24: Refer to treatment note for HEP details.  GOALS: Goals reviewed with patient? Yes  SHORT TERM GOALS: Target date: 08/16/24  Pt will be indep to perform HEP for improving BUE strength/flexibility/coordination  for daily tasks. Baseline: Eval: Not yet initiated. 05/13/24: performs HEP with handout Goal status: Achieved  LONG TERM GOALS: Target date: 09/27/24  Pt will increase L grip strength by 20 lbs or more in  order to improve ability to grasp and carry heavier ADL supplies.  Baseline: Eval: L grip 34 lbs (R 68 lbs). 05/13/24: L grip 55 lbs Goal status: Achieved  2.  Pt will increase L lateral pinch strength by 3 or more lbs to maintain grasp of fork for independence with cutting food. Baseline: Eval: L 7 lbs (R 13 lbs). 05/13/24: L 12 lbs Goal status: Achieved  3.  Pt will increase R shoulder and LUE strength by 1 MM grade or more to better engage BUEs into ADLs.  Baseline: Eval: See above MMT; pt must use R hand to lift LUE up from lap to table top; L shoulder mobility BFL, R limited below 90*.  Pt predominately performing ADLs with R dominant arm. 1/85/74: Achieved with R hand improving as seen above, L wrist achieved, L shoulder remains the same.  Goal status: Ongoing  4.  Pt will increase L hand FMC skills to manage clothing fasteners independently and manipulate small ADL supplies with reduced dropping.  Baseline: Eval: L 9 hole peg test 47 sec (R 24 sec). 05/13/24: L 47 sec.  Goal status: Ongoing  5.  Pt will tolerate manual therapy, therapeutic modalities, and exercises to decrease pain in bilat shoulders/LUE to a reported 5/10 pain or less with activity.  Baseline: Eval: up to 10/10 pain with activity. 05/13/24: 1/20 at rest, 5/10 with activity.  Goal status: Achieved  ASSESSMENT: CLINICAL IMPRESSION: Recert orders sent today to extend OT poc.  OT goals above will be updated over the next 2 visits for 20th visit progress update (visit 18 today).  Deltoid activation is still absent.  Pt has follow up EMG today, and continues to discuss surgical options with MD for possible nerve transfer.  Pt is showing some improvements with bicep activation.  Pt tolerated slight increase in resistance  today with L forearm supination.  Ongoing HEP recommendations or adjustments are typically needed each visit d/t pt recently reporting more stiffness in bilateral shoulders, and today reporting (assumed) median nerve pain based on report and presentation.  Pt responded well to therapist assisted median nerve gliding/flossing for the LUE, and did report a decrease in tingling to her thumb following these exercises.  Pt continues to utilize compensatory strategies, modified positioning, and AE as needed to perform self care tasks.  Pt is now able to manage putting her hair in a pony tail or bun by propping LUE onto her fireplace mantle in her home.  Pt will continue to benefit from strengthening accessory musculature surrounding L deltoid to maximize functional use of LUE, and continue to target RUE weakness for same.  Pt. continues to benefit from skilled OT to work towards above noted goals in OT poc in order to maximize indep with daily tasks.   PERFORMANCE DEFICITS: in functional skills including ADLs, IADLs, coordination, dexterity, sensation, ROM, strength, pain, flexibility, Fine motor control, Gross motor control, mobility, balance, endurance, decreased knowledge of precautions, decreased knowledge of use of DME, skin integrity, and UE functional use, cognitive skills including emotional, and psychosocial skills including coping strategies, environmental adaptation, habits, and routines and behaviors.   IMPAIRMENTS: are limiting patient from ADLs, IADLs, rest and sleep, work, leisure, and social participation.   CO-MORBIDITIES: has co-morbidities such as anxiety/depression, HTN, asthma, DM2, DDD (lumbar) that affects occupational performance. Patient will benefit from skilled OT to address above impairments and improve overall function.  MODIFICATION OR ASSISTANCE TO COMPLETE EVALUATION: Min-Moderate modification of tasks or assist with assess necessary to complete an  evaluation.  OT OCCUPATIONAL  PROFILE AND HISTORY: Detailed assessment: Review of records and additional review of physical, cognitive, psychosocial history related to current functional performance.  CLINICAL DECISION MAKING: Moderate - several treatment options, min-mod task modification necessary  REHAB POTENTIAL: Good  EVALUATION COMPLEXITY: Moderate  PLAN:  OT FREQUENCY: 2x/week  OT DURATION: 12 weeks  PLANNED INTERVENTIONS: 97168 OT Re-evaluation, 97535 self care/ADL training, 02889 therapeutic exercise, 97530 therapeutic activity, 97112 neuromuscular re-education, 97140 manual therapy, 97116 gait training, 02989 moist heat, 97010 cryotherapy, 97034 contrast bath, 97750 Physical Performance Testing, 02239 Orthotic Initial, 97763 Orthotic/Prosthetic subsequent, passive range of motion, balance training, functional mobility training, psychosocial skills training, energy conservation, coping strategies training, patient/family education, and DME and/or AE instructions  RECOMMENDED OTHER SERVICES: None at this time  CONSULTED AND AGREED WITH PLAN OF CARE: Patient and family member/caregiver  PLAN FOR NEXT SESSION: see above  Inocente Blazing, MS, OTR/L  07/05/2024, 11:26 AM

## 2024-07-05 NOTE — Addendum Note (Signed)
 Addended by: Unika Nazareno K on: 07/05/2024 02:42 PM   Modules accepted: Orders

## 2024-07-06 ENCOUNTER — Ambulatory Visit

## 2024-07-06 NOTE — Telephone Encounter (Signed)
 Scheduled telephone visit for 07/07/24. She has PT at [facility] 3:45-4:45pm on 07/07/24. She said she had her EMG today. Results are not available yet. I tried to call Neurology to request them, but they are already closed. Caitlin/Tearra, can one of you call first thing in the morning to ask them to have the results before her appt? Thanks!

## 2024-07-07 ENCOUNTER — Ambulatory Visit: Payer: Self-pay | Admitting: Neurosurgery

## 2024-07-07 ENCOUNTER — Ambulatory Visit: Admitting: Neurosurgery

## 2024-07-07 DIAGNOSIS — Z981 Arthrodesis status: Secondary | ICD-10-CM

## 2024-07-07 DIAGNOSIS — G589 Mononeuropathy, unspecified: Secondary | ICD-10-CM | POA: Diagnosis not present

## 2024-07-07 DIAGNOSIS — R29898 Other symptoms and signs involving the musculoskeletal system: Secondary | ICD-10-CM

## 2024-07-07 DIAGNOSIS — G540 Brachial plexus disorders: Secondary | ICD-10-CM | POA: Diagnosis not present

## 2024-07-07 HISTORY — DX: Mononeuropathy, unspecified: G58.9

## 2024-07-07 HISTORY — DX: Other symptoms and signs involving the musculoskeletal system: R29.898

## 2024-07-07 NOTE — Progress Notes (Signed)
  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 Significant weakness in the deltoid and external rotators with zero out of five strength in the left deltoid and biceps. EMG shows left preplexus lesion likely at upper superior trunk with prominent involvement of C6 fibers. Some motor units detected in the deltoid and brachioradialis, indicating potential improvement. Sensory changes include numbness in the left thumb, index finger, and lateral aspect of the forearm. - Review EMG data and send report to Dr. Claudene for further evaluation - Monitor for signs of reinnervation or new motor units in the deltoid or supraspinatus musculature - Consider neurotransfer and exploration if no signs of improvement  Right upper extremity weakness and chronic cervical radiculopathy following cervical spine surgery Right upper extremity has shown significant recovery post-surgery but still has some deficits. Chronic right C6 radiculopathy noted. Triceps strength is good, but biceps are significantly weaker. Improvement in infraspinatus noted with good motor units present. - Review EMG findings for further assessment  Right grade 2 carpal tunnel syndrome Chronic right grade 2 carpal tunnel syndrome noted.  Follow-up Follow-up with Dr. Claudene for further evaluation based on EMG findings. - Send EMG report to Dr. Claudene  History of Present Illness Eileen Chaney is a 48 year old female with diabetes who presents with arm and hand numbness and tingling, and neck pain following neck surgery. She was referred by Dr. Penne Claudene for evaluation of a possible brachial plexus lesion.  Postoperative neurological deficits - Underwent posterior cervical decompression and fusion on March 15, 2024 - Severe bilateral upper extremity pain and weakness immediately post-surgery -  Right upper extremity has shown significant recovery, though some deficits persist - Left upper extremity with partial recovery in elbow flexion, but persistent significant weakness in deltoid and external rotators - Biceps on left significantly weaker; unable to bring arm up with palm up, but can do so using brachioradialis - Concerned about lack of improvement in left deltoid and supraspinatus muscles approximately three months post-surgery  Sensory disturbances - Numbness and tingling in arm and hand since surgery - Numbness in left thumb and index finger - Decreased sensation in lateral aspect of left forearm and anterior arm  Electrodiagnostic findings - EMG in August 2025 showed a single motor unit in the left biceps - No detectable motor units in the left deltoid  Neck pain - Intermittent neck pain following cervical surgery  Diabetes mellitus - History of diabetes  Results DIAGNOSTIC EMG (04/2024): 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  EMG (07/05/2024): Some motor units in deltoid, no significant motor units in left biceps, changes in brachioradialis, infraspinatus with good motor units, supraspinatus with some changes.  There were no vitals filed for this visit. There is no height or weight on file to calculate BMI.  Physical Exam MUSCULOSKELETAL: Triceps strength normal. Biceps strength significantly weak. Left deltoid strength 0/5. Left biceps strength 0/5. External rotation strength weak. Internal rotation strength normal. Shoulder range of motion normal. NEUROLOGICAL: Sensation normal except left thumb and index finger. Numbness in lateral aspect of left forearm. Decreased sensation in anterior left arm.   This note has been created using automated tools and  reviewed for accuracy by [facility].

## 2024-07-07 NOTE — Addendum Note (Signed)
 Addended by: Sakira Dahmer E on: 07/07/2024 05:28 PM   Modules accepted: Orders

## 2024-07-07 NOTE — Progress Notes (Signed)
 I had a follow-up phone visit today with Eileen Chaney.  She was at home and I was in the office.  She gave consent to go forward with a phone visit.  We are discussing her severe left upper extremity weakness in the setting of previous recent cervical spine surgery.  We discussed that this could be a postoperative palsy versus a brachial plexus lesion.  We have been following her for her left upper extremity weakness which continues to be severe especially in the biceps and deltoid.  She had a repeat visit with neurology which is outlined below with an updated EMG.  She continues to have no appreciable units in her biceps and minimal units in her deltoids.  On physical examination she had no contraction of her bicep or deltoid musculature.  Her EMG did show some evidence of changes in her supra and infraspinatus with evidence of possible reinnervation, she also states that she has had some improvement in her external rotation but continues to have severe deficits in her elbow flexion and her deltoid function.  Given these findings with no evidence of motor function and minimal motor units noted in each muscle at this point she would benefit from a ulnar to musculocutaneous nerve transfer for biceps function, and a radial to axillary nerve transfer for deltoid function.  We discussed the risks and benefits of surgery including but not limited to, nerve injury, need for further surgery, lack of improvement, pain, wound healing issues, recurrence of brachial plexopathy.  Given her lack of improvement in her deltoid and biceps with severe findings on her repeat EMG she would like to go forward with surgery for reinnervation of her deltoid and biceps respectively.  Again this will be a left sided ulnar nerve to musculocutaneous nerve transfer and a radial to axillary nerve transfer.  Will plan on moving forward with surgery, we will utilize intraoperative neuromodulation to evaluate for any active function and  will perform either end and or end to side type nerve coaptation based off of our findings intraoperatively.  Spent a total of 10 minutes on this call today .

## 2024-07-08 ENCOUNTER — Ambulatory Visit

## 2024-07-08 ENCOUNTER — Telehealth: Payer: Self-pay

## 2024-07-08 ENCOUNTER — Other Ambulatory Visit: Payer: Self-pay

## 2024-07-08 DIAGNOSIS — G540 Brachial plexus disorders: Secondary | ICD-10-CM

## 2024-07-08 DIAGNOSIS — Z981 Arthrodesis status: Secondary | ICD-10-CM

## 2024-07-08 DIAGNOSIS — Z01818 Encounter for other preprocedural examination: Secondary | ICD-10-CM

## 2024-07-08 DIAGNOSIS — R29898 Other symptoms and signs involving the musculoskeletal system: Secondary | ICD-10-CM

## 2024-07-08 DIAGNOSIS — G589 Mononeuropathy, unspecified: Secondary | ICD-10-CM

## 2024-07-08 NOTE — Telephone Encounter (Signed)
 Spoke with [name] on the phone to go over surgical instructions and schedule post op appointments. No additional questions at this time.

## 2024-07-09 ENCOUNTER — Ambulatory Visit

## 2024-07-09 DIAGNOSIS — M6281 Muscle weakness (generalized): Secondary | ICD-10-CM | POA: Diagnosis not present

## 2024-07-09 DIAGNOSIS — G959 Disease of spinal cord, unspecified: Secondary | ICD-10-CM

## 2024-07-09 DIAGNOSIS — R278 Other lack of coordination: Secondary | ICD-10-CM

## 2024-07-09 NOTE — Therapy (Signed)
 OUTPATIENT OCCUPATIONAL THERAPY NEURO TREATMENT NOTE  Patient Name: Eileen Chaney MRN: [id number] DOB:Jan 24, 1976, 48 y.o., female Today's Date: 07/09/2024  PCP: Dr. Alm Needle REFERRING PROVIDER: Toribio Pitch, PA-C  END OF SESSION:  OT End of Session - 07/09/24 1132     Visit Number 19    Number of Visits 41    Date for Recertification  09/27/24    Progress Note Due on Visit 20    OT Start Time 0815    OT Stop Time 0845    OT Time Calculation (min) 30 min    Activity Tolerance Patient tolerated treatment well    Behavior During Therapy WFL for tasks assessed/performed         Past Medical History:  Diagnosis Date   Anxiety    Arthritis    Asthma    Cervical myelopathy with cervical radiculopathy (HCC)    Complication of anesthesia    with c sections had a had time getting her sedated   DDD (degenerative disc disease), cervical    Depression    Diabetes mellitus without complication (HCC)    type 2   Family history of adverse reaction to anesthesia    mother hard to wake up   Fibromyalgia    Hyperlipidemia    Hypertension    Idiopathic urticaria    Pneumonia    PONV (postoperative nausea and vomiting)    Undifferentiated inflammatory arthritis    Past Surgical History:  Procedure Laterality Date   ABDOMINAL HYSTERECTOMY  2018   uterine fibroids   ANKLE SURGERY Right 2022   ligament repair   ANTERIOR CERVICAL DECOMP/DISCECTOMY FUSION N/A 11/12/2021   Procedure: C3-5 ANTERIOR CERVICAL DISCECTOMY AND FUSION (GLOBUS HEDRON);  Surgeon: Clois Fret, MD;  Location: [facility] OR[facility];  Service: Neurosurgery;  Laterality: N/A;   CERVICAL WOUND DEBRIDEMENT N/A 01/20/2022   Procedure: posterior wound debridment;  Surgeon: Clois Fret, MD;  Location: [facility];  Service: Neurosurgery;  Laterality: N/A;   CESAREAN SECTION     X3 2000, 2001, 2009   DILATION AND CURETTAGE OF UTERUS     POSTERIOR CERVICAL FUSION/FORAMINOTOMY N/A 03/15/2024   Procedure:  POSTERIOR CERVICAL FUSION/FORAMINOTOMY LEVEL 4;  Surgeon: Clois Fret, MD;  Location: [facility];  Service: Neurosurgery;  Laterality: N/A;  C4-7 Posterior Spinal Decompression C4-7 Posterior Spinal Instrumentation C5-7 Fusion   POSTERIOR CERVICAL LAMINECTOMY N/A 01/07/2022   Procedure: OPEN C5-7 POSTERIOR DECOMPRESSION;  Surgeon: Clois Fret, MD;  Location: [facility];  Service: Neurosurgery;  Laterality: N/A;   TONSILLECTOMY  2000   WISDOM TOOTH EXTRACTION     Patient Active Problem List   Diagnosis Date Noted   Nerve weakness 07/07/2024   Upper extremity weakness 07/07/2024   Left arm weakness 06/14/2024   Brachial plexus lesion 06/14/2024   Anxiety with depression 03/24/2024   Cervical myelopathy (HCC) 03/15/2024   Spinal stenosis in cervical region 03/15/2024   Cervical radiculopathy 03/15/2024   S/P cervical spinal fusion 03/15/2024   Wound infection after surgery 01/20/2022   Gestational diabetes 06/02/2020   Arthritis 10/11/2019   DDD (degenerative disc disease), lumbar 10/11/2019   Hyperlipidemia 10/11/2019   Idiopathic urticaria 10/11/2019   Recurrent major depressive disorder, in partial remission 10/11/2019   Spinal stenosis of lumbar region without neurogenic claudication 10/11/2019   Type 2 diabetes mellitus without complication, with long-term current use of insulin  (HCC) 10/11/2019   Elevated lactic acid level 09/29/2017   Leukocytosis 09/29/2017   Diabetes (HCC) 09/28/2017   Hypertension 09/28/2017   Mild asthma  without complication 09/28/2017   Morbid obesity (HCC) 09/28/2017   Thalassemia 09/28/2017   GAD (generalized anxiety disorder) 07/25/2014   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, unspecified 03/14/2014   ONSET DATE: 03/15/24  REFERRING DIAG:  G95.9 (ICD-10-CM) - Cervical myelopathy (HCC)  Z98.1 (ICD-10-CM) - S/P cervical spinal fusion   THERAPY DIAG:  Muscle weakness (generalized)  Other lack of coordination  Cervical  myelopathy (HCC)  Rationale for Evaluation and Treatment: Rehabilitation  SUBJECTIVE:  SUBJECTIVE STATEMENT: Pt reports that she plans to proceed with the nerve transfer surgery with Dr. Claudene.  Pre-op visit is scheduled for Oct 28, per pt.  Pt accompanied by: self  PERTINENT HISTORY: Per chart: CIR d/c summary: Aina C Giron is a 48 y.o. right-handed female with history significant for anxiety/depression as well as component of ADHD maintained on Ritalin , asthma, class I obesity with BMI 35.28 diabetes mellitus fibromyalgia hyperlipidemia hypertension posterior cervical laminectomy 01/07/2022, anterior cervical decompressive discectomy fusion 11/12/2021 complicated by cervical wound debridement 01/20/2022.  Per chart review patient lives with daughter.  She has good support from her family.  Two-level home bed and bath on main level independent community ambulator.  Present[facility] to [facility]/15/2024 with increasing difficulty with dexterity in her hands as well as poor balance.  She was having trouble using her phone as well as difficulty with buttons and clasping her necklaces.  She continued to have ongoing neck and left arm and shoulder pain with numbness as well as low back pain that extends into her buttocks.  She had denied any recent falls.  Recent imaging showed severe central canal stenosis at C4-5 C5-6 with foraminal stenosis from C3-7.  Most recent preoperative labs showed a hemoglobin of 12.8 hematocrit 40.3 on 03/03/2024 and unremarkable chemistries.  Underwent posterior segmental instrumentation of C3-7 as well as posterior lateral arthrodesis from C5-7 with cervical laminectomy from C4-C6 for decompression of the spinal cord with bilateral foraminotomies and facetectomies at C3/4 C4-5 C5-6 and left foraminotomies C6-7 03/15/2024 per Dr. Reeves Nine.  Laced in a cervical brace for comfort.  She did complete a course of Decadron  taper.  Patient control with pain with the use of Dilaudid  as  well as Valium .  Lovenox  added for DVT prophylaxis 03/16/2024.  Follow-up imaging CT/MRI cervical spine 03/16/2024 due to complaints of increasing neck pain and acute left arm weakness showed noted degenerative changes moderate to severe spinal canal stenosis C3-4 with mild cord compression without definitive cord signal abnormality.  Multilevel foraminal stenosis greatest and severe bilaterally at C3-4 C5-6 on the left at C6-7.  Moderate to severe spinal canal stenosis on the right at C6-7.  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  Therapy evaluations completed due to patient decreased functional mobility was admitted for a comprehensive rehab program.   PRECAUTIONS: Cervical,   WEIGHT BEARING RESTRICTIONS: Yes ; no lifting >10 lbs  PAIN: 07/09/24: 3-4/10 pain LUE Are you having pain? Yes: NPRS scale: 4/10 pain at rest, 8/10 with activity Pain location: neck, shoulders down to hands Pain description: nerve pain Aggravating factors: walking, sitting, standing, moving L arm  Relieving factors: reclining  FALLS: Has patient fallen in last 6 months? 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   OBJECTIVE:  Note: Objective measures were completed at Evaluation unless otherwise noted.  HAND DOMINANCE: Right  ADLs: Overall  ADLs: Caregiver assist for ADLs Transfers/ambulation related to ADLs: using rollator at home for balance and to take pressure off shoulders and back Eating: difficulty cutting food Grooming: Max A with hair care d/t BUE weakness UB Dressing: Limited engagement of LUE LB Dressing: Difficulty hiking pants d/t limited engagement of the LUE Toileting: modified indep using R dominant/less affected arm Bathing: supv for tub/shower transfers; assist to wash upper R side of body; reports her long handled sponge is ineffective Tub Shower transfers: transfer tub bench in place but able to step over shower with supv and sits on bench prn throughout shower Equipment: Transfer tub bench  IADLs: Shopping: dep Light housekeeping: Max A; pt reports she can load washing machine but is unable to load/unload dishwasher d/t the bending required Meal Prep: Partner managing meals Community mobility: increased pain/limited Medication management: indep Financial management: indep  POSTURE COMMENTS:  rounded shoulders and forward head  ACTIVITY TOLERANCE: Activity tolerance: Limited tolerance for sitting and standing; more comfortable in supine (performed eval partially supine on mat to manage pain)  UPPER EXTREMITY ROM:    Active ROM Right eval Left eval Left 05/11/24  Shoulder flexion 55 30 45  Shoulder abduction 55 20 20  Shoulder adduction     Shoulder extension     Shoulder internal rotation WNL Unable to attempt behind back Pt crawls fingers around hip and buttocks  Shoulder external rotation -20 Unable to attempt behind head Unable to attempt behind head against gravity  Elbow flexion     Elbow extension     Wrist flexion     Wrist extension     Wrist ulnar deviation     Wrist radial deviation     Wrist pronation     Wrist supination     (Blank rows = not tested)  UPPER EXTREMITY MMT:     MMT Right eval R 05/13/24 Left eval L 05/13/24  Shoulder flexion 2+ 3+/5 2- 2-/5  Shoulder  abduction 2+ 2+/5 2- 2-/5  Shoulder adduction      Shoulder extension      Shoulder internal rotation 4 4 2+ 3-/5  Shoulder external rotation 3- 3+ 2 2/5  Middle trapezius      Lower trapezius      Elbow flexion 4+ 5 1 to 2- (will assess more thoroughly next session) 2-/5  Elbow extension 5  4 4+/5  Wrist flexion 5  4 5   Wrist extension 5  4 5   Wrist ulnar deviation      Wrist radial deviation      Wrist pronation 5  3+ 5/5  Wrist supination 5  3- 4/5  (Blank rows = not tested)  HAND FUNCTION: Grip strength: Right: 68 lbs; Left: 34 lbs, Lateral pinch: Right: 13 lbs, Left: 7 lbs, and 3 point pinch: Right: 8 lbs, Left: 5 lbs 05/13/24: Grip strength: Right: 67 lbs; Left: 55 lbs, Lateral pinch: Right: 16 lbs, Left: 12 lbs, and 3 point pinch:  Right: 13 lbs, Left: 13 lbs  COORDINATION: 9 Hole Peg test: Right: 24 sec; Left: 47 sec  05/23/24: 9 Hole Peg test: Right: 25 sec; Left: 47 sec  SENSATION: tingling/numbness in L hand digits 1-3, diminished up the L arm, no tingling in the RUE  COGNITION: Overall cognitive status: Within functional limits for tasks assessed  VISION: WNL  OBSERVATIONS:  Pt visibly in pain in with facial grimacing during shoulder shrugs  NEXT MD APPT: Return to neuro surgeon Sept.                                                                                                                            TREATMENT DATE: 07/09/24  Therapeutic Exercise:  -Supine passive stretching to reduce L/R shoulder stiffness; OT provided proximal and distal support d/t weakness to control end ranges for all shoulder planes of movement -Supine passive median nerve stretch R and L (elbow and wrist extended, forearm supinated, thumb abducted)  -Passive stretching for R/L forearm pron/sup and wrist and digit ext -Passive bilat shoulder depression and retraction stretch to tolerance -Dowel stretch for active assisted bilat shoulder flex, abd, ER with min vc for  form/technique  Self Care: -Poc review pre/post surgery.  Recommendation for pt to plan for therapy with [facility] staffing or [facility], but not both, in order to achieve optimal continuity of care.    PATIENT EDUCATION: Education details: poc Person educated: Patient Education method: Explanation and Verbal cues Education comprehension: verbalized understanding  HOME EXERCISE PROGRAM: 04/26/24:  HEP through [facility]: -AAROM at the tabletop for internal, and external rotation. -Shoulder flexion/extension AAROM with dowel/cane base blocked at the wall -Shoulder circles AAROM with the dowel/cane into wall- with base blacked at the wall -Elbow flexion, extension extension AAROM with dowel/cane into wall with base of dowel/cane supported at the wall.  04/20/24:-LUE AAROM/AROM to tolerance and within cervical precautions; shoulder shrugs and retraction in front of mirror, theraputty 04/15/24: -positioning for comfort 04/13/24: -Bilateral AAROM Shoulder flexion and abduction tabletop exercises while seated. -AROM, and resistive forearm supination sitting with the LUE supported on pillows. -Supine left elbow flexion in gravity eliminated plane, progressing to partial gravity with resistance. 04/08/24: Refer to treatment note for HEP details.  GOALS: Goals reviewed with patient? Yes  SHORT TERM GOALS: Target date: 08/16/24  Pt will be indep to perform HEP for improving BUE strength/flexibility/coordination for daily tasks. Baseline: Eval: Not yet initiated. 05/13/24: performs HEP with handout Goal status: Achieved  LONG TERM GOALS: Target date: 09/27/24  Pt will increase L grip strength by 20 lbs or more in order to improve ability to grasp and carry heavier ADL supplies.  Baseline: Eval: L grip 34 lbs (R 68 lbs). 05/13/24: L grip 55 lbs Goal status: Achieved  2.  Pt will increase L lateral pinch strength by 3 or more lbs to maintain grasp of fork for independence with cutting food. Baseline: Eval:  L 7 lbs (R 13 lbs). 05/13/24: L 12  lbs Goal status: Achieved  3.  Pt will increase R shoulder and LUE strength by 1 MM grade or more to better engage BUEs into ADLs.  Baseline: Eval: See above MMT; pt must use R hand to lift LUE up from lap to table top; L shoulder mobility BFL, R limited below 90*.  Pt predominately performing ADLs with R dominant arm. 1/85/74: Achieved with R hand improving as seen above, L wrist achieved, L shoulder remains the same.  Goal status: Ongoing  4.  Pt will increase L hand [facility] skills to manage clothing fasteners independently and manipulate small ADL supplies with reduced dropping.  Baseline: Eval: L 9 hole peg test 47 sec (R 24 sec). 05/13/24: L 47 sec.  Goal status: Ongoing  5.  Pt will tolerate manual therapy, therapeutic modalities, and exercises to decrease pain in bilat shoulders/LUE to a reported 5/10 pain or less with activity.  Baseline: Eval: up to 10/10 pain with activity. 05/13/24: 1/20 at rest, 5/10 with activity.  Goal status: Achieved  ASSESSMENT: CLINICAL IMPRESSION: Shortened tx session this date as pt arrived late.  Pt agreeable to shortened session and requested to focus on passive stretching d/t feeling increased stiffness after an autoimmune flare this week.  Good tolerance to BUE exercises noted above.  Pt continues to benefit from proximal and distal support during passive stretching to maximize tolerable end ranges and to perform above with reduced substitution patterns.  Pt tends to guard proximally and distally, but responds to tactile or vc for increasing relaxation to targeted areas.   Pt plans to proceed with nerve transfer surgery with Dr. Claudene, and will have pre-op visit end of this month, and likely surgery to be scheduled 1-2 weeks after that visit, per pt.  Pt has been encouraged to plan for therapy with [facility] staffing or [facility], but not both, in order to achieve optimal continuity of care.  Pt in agreement with this plan and she will plan  to follow up with OT on her decision.  Pt will continue to benefit from strengthening accessory musculature surrounding L deltoid to maximize functional use of LUE, and continue to target RUE weakness for same.  Pt. continues to benefit from skilled OT to work towards above noted goals in OT poc in order to maximize indep with daily tasks.   PERFORMANCE DEFICITS: in functional skills including ADLs, IADLs, coordination, dexterity, sensation, ROM, strength, pain, flexibility, Fine motor control, Gross motor control, mobility, balance, endurance, decreased knowledge of precautions, decreased knowledge of use of DME, skin integrity, and UE functional use, cognitive skills including emotional, and psychosocial skills including coping strategies, environmental adaptation, habits, and routines and behaviors.   IMPAIRMENTS: are limiting patient from ADLs, IADLs, rest and sleep, work, leisure, and social participation.   CO-MORBIDITIES: has co-morbidities such as anxiety/depression, HTN, asthma, DM2, DDD (lumbar) that affects occupational performance. Patient will benefit from skilled OT to address above impairments and improve overall function.  MODIFICATION OR ASSISTANCE TO COMPLETE EVALUATION: Min-Moderate modification of tasks or assist with assess necessary to complete an evaluation.  OT OCCUPATIONAL PROFILE AND HISTORY: Detailed assessment: Review of records and additional review of physical, cognitive, psychosocial history related to current functional performance.  CLINICAL DECISION MAKING: Moderate - several treatment options, min-mod task modification necessary  REHAB POTENTIAL: Good  EVALUATION COMPLEXITY: Moderate  PLAN:  OT FREQUENCY: 2x/week  OT DURATION: 12 weeks  PLANNED INTERVENTIONS: 97168 OT Re-evaluation, 97535 self care/ADL training, 02889 therapeutic exercise, 97530 therapeutic activity, 97112 neuromuscular re-education,  97140 manual therapy, 97116 gait training, 02989 moist  heat, 97010 cryotherapy, 97034 contrast bath, 97750 Physical Performance Testing, 02239 Orthotic Initial, [id number] Orthotic/Prosthetic subsequent, passive range of motion, balance training, functional mobility training, psychosocial skills training, energy conservation, coping strategies training, patient/family education, and DME and/or AE instructions  RECOMMENDED OTHER SERVICES: None at this time  CONSULTED AND AGREED WITH PLAN OF CARE: Patient and family member/caregiver  PLAN FOR NEXT SESSION: 20th visit progress update/objective measures to be taken  Inocente Blazing, MS, OTR/L  07/09/2024, 11:34 AM

## 2024-07-13 ENCOUNTER — Encounter: Payer: Self-pay | Admitting: Neurosurgery

## 2024-07-13 ENCOUNTER — Ambulatory Visit

## 2024-07-15 ENCOUNTER — Ambulatory Visit

## 2024-07-19 ENCOUNTER — Other Ambulatory Visit: Payer: Self-pay | Admitting: Medical Genetics

## 2024-07-19 DIAGNOSIS — Z006 Encounter for examination for normal comparison and control in clinical research program: Secondary | ICD-10-CM

## 2024-07-20 ENCOUNTER — Encounter

## 2024-07-21 ENCOUNTER — Other Ambulatory Visit: Payer: Self-pay

## 2024-07-21 ENCOUNTER — Encounter
Admission: RE | Admit: 2024-07-21 | Discharge: 2024-07-21 | Disposition: A | Discharge status: Home / Self Care | Source: Ambulatory Visit | Attending: Neurosurgery | Admitting: Neurosurgery

## 2024-07-21 VITALS — Ht 68.0 in | Wt 222.0 lb

## 2024-07-21 DIAGNOSIS — Z01812 Encounter for preprocedural laboratory examination: Secondary | ICD-10-CM

## 2024-07-21 DIAGNOSIS — E119 Type 2 diabetes mellitus without complications: Secondary | ICD-10-CM

## 2024-07-21 HISTORY — DX: Sleep apnea, unspecified: G47.30

## 2024-07-21 NOTE — Patient Instructions (Addendum)
 Your procedure is scheduled on:  TUESDAY OCTOBER 28  Report to the Registration Desk on the 1st floor of the [facility] To find out your arrival time, please call [phone] between 1PM - 3PM on:  MONDAY OCTOBER 27  If your arrival time is 6:00 am, do not arrive before that time as the [facility] entrance doors do not open until 6:00 am.  REMEMBER: Instructions that are not followed completely may result in serious medical risk, up to and including death; or upon the discretion of your surgeon and anesthesiologist your surgery may need to be rescheduled.  Do not eat food after midnight the night before surgery.  No gum chewing or hard candies.  You may however, drink WATER  up to 2 hours before you are scheduled to arrive for your surgery. Do not drink anything within 2 hours of your scheduled arrival time.   One week prior to surgery: Stop Anti-inflammatories (NSAIDS) such as Advil , Aleve, Ibuprofen , Motrin , Naproxen, Naprosyn and Aspirin based products such as Excedrin, Goody's Powder, BC Powder. Stop ANY OVER THE COUNTER supplements until after surgery. cetirizine (ZYRTEC)  Collagen-Vitamin C-Biotin (COLLAGEN PO)  COLOSTRUM  FIBER  fluticasone  (FLONASE )  folic acid  (FOLVITE )  melatonin  MORINGA  Multiple Vitamin (MULTIVITAMIN WITH MINERALS)  Multiple Vitamins-Minerals (LIVER DETOX)  VIT C-QUERCET-BIOFLV-BROMELAIN  Vitamin D , Ergocalciferol , (DRISDOL )  You may however, continue to take Tylenol  if needed for pain up until the day of surgery.  NALTREXONE hold 48 hours prior to surgery, last dose SATURDAY OCTOBER 25   **Follow guidelines for insulin  and diabetes medications.** tirzepatide (MOUNJARO) hold 7 days prior to surgery , last dose MONDAY OCTOBER 20   Continue taking all of your other prescription medications up until the day of surgery.  ON THE DAY OF SURGERY ONLY TAKE THESE MEDICATIONS WITH SIPS OF WATER :  desvenlafaxine (PRISTIQ)  methylphenidate  (RITALIN   LA)  predniSONE (DELTASONE)  pregabalin  (LYRICA )   No Alcohol for 24 hours before or after surgery.  No Smoking including e-cigarettes for 24 hours before surgery.   Do not use any recreational drugs for at least a week (preferably 2 weeks) before your surgery.  Please be advised that the combination of cocaine and anesthesia may have negative outcomes, up to and including death. If you test positive for cocaine, your surgery will be cancelled.  On the morning of surgery brush your teeth with toothpaste and water , you may rinse your mouth with mouthwash if you wish. Do not swallow any toothpaste or mouthwash.  Use CHG Soap as directed on instruction sheet.  Do not wear jewelry, make-up, hairpins, clips or nail polish.  For welded (permanent) jewelry: bracelets, anklets, waist bands, etc.  Please have this removed prior to surgery.  If it is not removed, there is a chance that hospital personnel will need to cut it off on the day of surgery.  Do not wear lotions, powders, or perfumes.   Do not shave body hair from the neck down 48 hours before surgery.  Do not bring valuables to the hospital. [facility] is not responsible for any missing/lost belongings or valuables.   Notify your doctor if there is any change in your medical condition (cold, fever, infection).  Wear comfortable clothing (specific to your surgery type) to the hospital.  After surgery, you can help prevent lung complications by doing breathing exercises.  Take deep breaths and cough every 1-2 hours.   If you are being discharged the day of surgery, you will not be  allowed to drive home. You will need a responsible individual to drive you home and stay with you for 24 hours after surgery.   If you are taking public transportation, you will need to have a responsible individual with you.  Please call the Pre-admissions Testing Dept. at [phone] if you have any questions about these instructions.  Surgery  Visitation Policy:  Patients having surgery or a procedure may have two visitors.  Children under the age of 65 must have an adult with them who is not the patient.  Merchandiser, retail to address health-related social needs:  [email or web address]                                                                                                              Preparing for Surgery with CHLORHEXIDINE  GLUCONATE (CHG) Soap  Chlorhexidine  Gluconate (CHG) Soap  o An antiseptic cleaner that kills germs and bonds with the skin to continue killing germs even after washing  o Used for showering the night before surgery and morning of surgery  Before surgery, you can play an important role by reducing the number of germs on your skin.  CHG (Chlorhexidine  gluconate) soap is an antiseptic cleanser which kills germs and bonds with the skin to continue killing germs even after washing.  Please do not use if you have an allergy to CHG or antibacterial soaps. If your skin becomes reddened/irritated stop using the CHG.  1. Shower the NIGHT BEFORE SURGERY with CHG soap.  2. If you choose to wash your hair, wash your hair first as usual with your normal shampoo.  3. After shampooing, rinse your hair and body thoroughly to remove the shampoo.  4. Use CHG as you would any other liquid soap. You can apply CHG directly to the skin and wash gently with a clean washcloth.  5. Apply the CHG soap to your body only from the neck down. Do not use on open wounds or open sores. Avoid contact with your eyes, ears, mouth, and genitals (private parts). Wash face and genitals (private parts) with your normal soap.  6. Wash thoroughly, paying special attention to the area where your surgery will be performed.  7. Thoroughly rinse your body with warm water .  8. Do not shower/wash with your normal soap after using and rinsing off the CHG soap.  9. Do not use lotions, oils, etc., after showering  with CHG.  10. Pat yourself dry with a clean towel.  11. Wear clean pajamas to bed the night before surgery.  12. Place clean sheets on your bed the night of your shower and do not sleep with pets.  13. Do not apply any deodorants/lotions/powders.  14. Please wear clean clothes to the hospital.  15. Remember to brush your teeth with your regular toothpaste.

## 2024-07-22 ENCOUNTER — Encounter

## 2024-07-27 ENCOUNTER — Other Ambulatory Visit: Payer: Self-pay

## 2024-07-27 ENCOUNTER — Ambulatory Visit
Admission: RE | Admit: 2024-07-27 | Discharge: 2024-07-27 | Disposition: A | Discharge status: Home / Self Care | Attending: Neurosurgery | Admitting: Neurosurgery

## 2024-07-27 ENCOUNTER — Ambulatory Visit: Payer: Self-pay | Admitting: Certified Registered"

## 2024-07-27 ENCOUNTER — Encounter
Admission: RE | Disposition: A | Discharge status: Home / Self Care | Payer: Self-pay | Source: Home / Self Care | Attending: Neurosurgery

## 2024-07-27 ENCOUNTER — Telehealth: Payer: Self-pay | Admitting: Physician Assistant

## 2024-07-27 ENCOUNTER — Encounter: Payer: Self-pay | Admitting: Neurosurgery

## 2024-07-27 ENCOUNTER — Encounter

## 2024-07-27 DIAGNOSIS — J45909 Unspecified asthma, uncomplicated: Secondary | ICD-10-CM | POA: Insufficient documentation

## 2024-07-27 DIAGNOSIS — G540 Brachial plexus disorders: Secondary | ICD-10-CM | POA: Diagnosis not present

## 2024-07-27 DIAGNOSIS — Z87891 Personal history of nicotine dependence: Secondary | ICD-10-CM | POA: Insufficient documentation

## 2024-07-27 DIAGNOSIS — Z01818 Encounter for other preprocedural examination: Secondary | ICD-10-CM

## 2024-07-27 DIAGNOSIS — E119 Type 2 diabetes mellitus without complications: Secondary | ICD-10-CM | POA: Diagnosis not present

## 2024-07-27 DIAGNOSIS — Z981 Arthrodesis status: Secondary | ICD-10-CM | POA: Diagnosis present

## 2024-07-27 DIAGNOSIS — R29898 Other symptoms and signs involving the musculoskeletal system: Secondary | ICD-10-CM | POA: Diagnosis present

## 2024-07-27 DIAGNOSIS — I10 Essential (primary) hypertension: Secondary | ICD-10-CM | POA: Insufficient documentation

## 2024-07-27 DIAGNOSIS — R531 Weakness: Secondary | ICD-10-CM | POA: Diagnosis not present

## 2024-07-27 DIAGNOSIS — G4730 Sleep apnea, unspecified: Secondary | ICD-10-CM | POA: Diagnosis not present

## 2024-07-27 DIAGNOSIS — Z01812 Encounter for preprocedural laboratory examination: Secondary | ICD-10-CM

## 2024-07-27 DIAGNOSIS — G589 Mononeuropathy, unspecified: Secondary | ICD-10-CM | POA: Diagnosis not present

## 2024-07-27 HISTORY — PX: NERVE TRANSFER: SHX2084

## 2024-07-27 LAB — GLUCOSE, CAPILLARY
Glucose-Capillary: 87 mg/dL (ref 70–99)
Glucose-Capillary: 90 mg/dL (ref 70–99)

## 2024-07-27 SURGERY — NERVE TRANSFER
Anesthesia: General | Site: Arm Upper | Laterality: Left

## 2024-07-27 MED ORDER — CHLORHEXIDINE GLUCONATE 0.12 % MT SOLN
OROMUCOSAL | Status: AC
Start: 2024-07-27 — End: 2024-07-27
  Filled 2024-07-27: qty 15

## 2024-07-27 MED ORDER — PHENYLEPHRINE HCL-NACL 20-0.9 MG/250ML-% IV SOLN
INTRAVENOUS | Status: DC | PRN
  Administered 2024-07-27: 40 ug/min via INTRAVENOUS

## 2024-07-27 MED ORDER — OXYCODONE HCL 5 MG PO TABS
ORAL_TABLET | ORAL | Status: AC
  Filled 2024-07-27: qty 1

## 2024-07-27 MED ORDER — CEFAZOLIN SODIUM-DEXTROSE 2-4 GM/100ML-% IV SOLN
2.0000 g | Freq: Once | INTRAVENOUS | Status: AC
  Administered 2024-07-27: 2 g via INTRAVENOUS

## 2024-07-27 MED ORDER — PROPOFOL 10 MG/ML IV BOLUS
INTRAVENOUS | Status: DC | PRN
  Administered 2024-07-27 (×2): 200 mg via INTRAVENOUS
  Administered 2024-07-27: 20 mg via INTRAVENOUS

## 2024-07-27 MED ORDER — FENTANYL CITRATE (PF) 100 MCG/2ML IJ SOLN
INTRAMUSCULAR | Status: DC | PRN
  Administered 2024-07-27 (×4): 50 ug via INTRAVENOUS

## 2024-07-27 MED ORDER — ONDANSETRON HCL 4 MG/2ML IJ SOLN
INTRAMUSCULAR | Status: DC | PRN
  Administered 2024-07-27: 4 mg via INTRAVENOUS

## 2024-07-27 MED ORDER — MIDAZOLAM HCL 2 MG/2ML IJ SOLN
INTRAMUSCULAR | Status: AC
  Filled 2024-07-27: qty 2

## 2024-07-27 MED ORDER — PROPOFOL 10 MG/ML IV BOLUS
INTRAVENOUS | Status: AC
  Filled 2024-07-27: qty 20

## 2024-07-27 MED ORDER — CEFAZOLIN SODIUM-DEXTROSE 2-4 GM/100ML-% IV SOLN
INTRAVENOUS | Status: AC
  Filled 2024-07-27: qty 100

## 2024-07-27 MED ORDER — METHYLPREDNISOLONE ACETATE 40 MG/ML IJ SUSP
INTRAMUSCULAR | Status: AC
  Filled 2024-07-27: qty 1

## 2024-07-27 MED ORDER — REMIFENTANIL HCL 1 MG IV SOLR
INTRAVENOUS | Status: AC
  Filled 2024-07-27: qty 1000

## 2024-07-27 MED ORDER — CHLORHEXIDINE GLUCONATE 0.12 % MT SOLN
15.0000 mL | Freq: Once | OROMUCOSAL | Status: AC
  Administered 2024-07-27: 15 mL via OROMUCOSAL

## 2024-07-27 MED ORDER — REMIFENTANIL HCL 1 MG IV SOLR
INTRAVENOUS | Status: AC
  Filled 2024-07-27: qty 2000

## 2024-07-27 MED ORDER — DIPHENHYDRAMINE HCL 50 MG/ML IJ SOLN
INTRAMUSCULAR | Status: AC
  Filled 2024-07-27: qty 1

## 2024-07-27 MED ORDER — OXYCODONE HCL 5 MG PO TABS
5.0000 mg | ORAL_TABLET | ORAL | 0 refills | Status: AC | PRN
  Filled 2024-07-27: qty 30, 5d supply, fill #0

## 2024-07-27 MED ORDER — REMIFENTANIL HCL 1 MG IV SOLR
INTRAVENOUS | Status: DC | PRN
  Administered 2024-07-27: .1 ug/kg/min via INTRAVENOUS

## 2024-07-27 MED ORDER — TISSEEL 10 ML EX KIT
PACK | CUTANEOUS | Status: DC | PRN
  Administered 2024-07-27: 2 mL via TOPICAL

## 2024-07-27 MED ORDER — SENNA 8.6 MG PO TABS
1.0000 | ORAL_TABLET | Freq: Two times a day (BID) | ORAL | 0 refills | Status: DC | PRN
  Filled 2024-07-27: qty 30, 15d supply, fill #0

## 2024-07-27 MED ORDER — BUPIVACAINE-EPINEPHRINE (PF) 0.5% -1:200000 IJ SOLN
INTRAMUSCULAR | Status: AC
Start: 2024-07-27 — End: 2024-07-27
  Filled 2024-07-27: qty 20

## 2024-07-27 MED ORDER — ONDANSETRON HCL 4 MG/2ML IJ SOLN
INTRAMUSCULAR | Status: AC
  Filled 2024-07-27: qty 2

## 2024-07-27 MED ORDER — FENTANYL CITRATE (PF) 100 MCG/2ML IJ SOLN
INTRAMUSCULAR | Status: AC
  Filled 2024-07-27: qty 2

## 2024-07-27 MED ORDER — ACETAMINOPHEN 10 MG/ML IV SOLN
INTRAVENOUS | Status: AC
  Filled 2024-07-27: qty 100

## 2024-07-27 MED ORDER — SUCCINYLCHOLINE CHLORIDE 200 MG/10ML IV SOSY
PREFILLED_SYRINGE | INTRAVENOUS | Status: DC | PRN
  Administered 2024-07-27: 100 mg via INTRAVENOUS

## 2024-07-27 MED ORDER — ROCURONIUM BROMIDE 10 MG/ML (PF) SYRINGE
PREFILLED_SYRINGE | INTRAVENOUS | Status: AC
  Filled 2024-07-27: qty 10

## 2024-07-27 MED ORDER — KETAMINE HCL 50 MG/5ML IJ SOSY
PREFILLED_SYRINGE | INTRAMUSCULAR | Status: DC | PRN
  Administered 2024-07-27 (×2): 25 mg via INTRAVENOUS

## 2024-07-27 MED ORDER — SODIUM CHLORIDE (PF) 0.9 % IJ SOLN
INTRAMUSCULAR | Status: AC
  Filled 2024-07-27: qty 10

## 2024-07-27 MED ORDER — LIDOCAINE HCL (CARDIAC) PF 100 MG/5ML IV SOSY
PREFILLED_SYRINGE | INTRAVENOUS | Status: DC | PRN
  Administered 2024-07-27: 100 mg via INTRAVENOUS

## 2024-07-27 MED ORDER — 0.9 % SODIUM CHLORIDE (POUR BTL) OPTIME
TOPICAL | Status: DC | PRN
  Administered 2024-07-27 (×2): 500 mL

## 2024-07-27 MED ORDER — PHENYLEPHRINE HCL-NACL 20-0.9 MG/250ML-% IV SOLN
INTRAVENOUS | Status: AC
  Filled 2024-07-27: qty 250

## 2024-07-27 MED ORDER — SODIUM CHLORIDE 0.9 % IV SOLN: INTRAVENOUS | Status: DC

## 2024-07-27 MED ORDER — PROPOFOL 1000 MG/100ML IV EMUL
INTRAVENOUS | Status: AC
  Filled 2024-07-27: qty 100

## 2024-07-27 MED ORDER — OXYCODONE HCL 5 MG/5ML PO SOLN: 5.0000 mg | Freq: Once | ORAL | Status: AC | PRN

## 2024-07-27 MED ORDER — OXYCODONE HCL 5 MG PO TABS
5.0000 mg | ORAL_TABLET | Freq: Once | ORAL | Status: AC | PRN
  Administered 2024-07-27: 5 mg via ORAL

## 2024-07-27 MED ORDER — DEXAMETHASONE SOD PHOSPHATE PF 10 MG/ML IJ SOLN
INTRAMUSCULAR | Status: DC | PRN
  Administered 2024-07-27: 10 mg via INTRAVENOUS

## 2024-07-27 MED ORDER — HYDROMORPHONE HCL 1 MG/ML IJ SOLN
INTRAMUSCULAR | Status: AC
  Filled 2024-07-27: qty 1

## 2024-07-27 MED ORDER — PROPOFOL 500 MG/50ML IV EMUL
INTRAVENOUS | Status: DC | PRN
  Administered 2024-07-27: 80 ug/kg/min via INTRAVENOUS

## 2024-07-27 MED ORDER — PROPOFOL 1000 MG/100ML IV EMUL
INTRAVENOUS | Status: AC
Start: 2024-07-27 — End: 2024-07-27
  Filled 2024-07-27: qty 100

## 2024-07-27 MED ORDER — BUPIVACAINE LIPOSOME 1.3 % IJ SUSP
INTRAMUSCULAR | Status: AC
  Filled 2024-07-27: qty 20

## 2024-07-27 MED ORDER — DROPERIDOL 2.5 MG/ML IJ SOLN: 0.6250 mg | Freq: Once | INTRAMUSCULAR | Status: DC | PRN

## 2024-07-27 MED ORDER — PHENYLEPHRINE 80 MCG/ML (10ML) SYRINGE FOR IV PUSH (FOR BLOOD PRESSURE SUPPORT)
PREFILLED_SYRINGE | INTRAVENOUS | Status: DC | PRN
  Administered 2024-07-27 (×2): 80 ug via INTRAVENOUS
  Administered 2024-07-27: 160 ug via INTRAVENOUS
  Administered 2024-07-27: 80 ug via INTRAVENOUS

## 2024-07-27 MED ORDER — FENTANYL CITRATE (PF) 100 MCG/2ML IJ SOLN
25.0000 ug | INTRAMUSCULAR | Status: DC | PRN
  Administered 2024-07-27: 25 ug via INTRAVENOUS

## 2024-07-27 MED ORDER — POLYETHYLENE GLYCOL 3350 17 GM/SCOOP PO POWD
17.0000 g | Freq: Every day | ORAL | 0 refills | Status: DC | PRN
  Filled 2024-07-27: qty 238, 14d supply, fill #0

## 2024-07-27 MED ORDER — MIDAZOLAM HCL (PF) 2 MG/2ML IJ SOLN
INTRAMUSCULAR | Status: DC | PRN
  Administered 2024-07-27: 2 mg via INTRAVENOUS

## 2024-07-27 MED ORDER — DIPHENHYDRAMINE HCL 50 MG/ML IJ SOLN
50.0000 mg | Freq: Once | INTRAMUSCULAR | Status: AC
  Administered 2024-07-27: 50 mg via INTRAVENOUS

## 2024-07-27 MED ORDER — ORAL CARE MOUTH RINSE: 15.0000 mL | Freq: Once | OROMUCOSAL | Status: AC

## 2024-07-27 MED ORDER — ACETAMINOPHEN 10 MG/ML IV SOLN
1000.0000 mg | Freq: Once | INTRAVENOUS | Status: DC | PRN
  Administered 2024-07-27: 1000 mg via INTRAVENOUS

## 2024-07-27 MED ORDER — HYDROMORPHONE HCL 1 MG/ML IJ SOLN
INTRAMUSCULAR | Status: DC | PRN
  Administered 2024-07-27 (×2): .5 mg via INTRAVENOUS

## 2024-07-27 MED ORDER — KETAMINE HCL 50 MG/5ML IJ SOSY
PREFILLED_SYRINGE | INTRAMUSCULAR | Status: AC
  Filled 2024-07-27: qty 5

## 2024-07-27 MED ORDER — BUPIVACAINE-EPINEPHRINE 0.5% -1:200000 IJ SOLN
INTRAMUSCULAR | Status: DC | PRN
  Administered 2024-07-27: 8 mL
  Administered 2024-07-27: 10 mL

## 2024-07-27 MED ORDER — LIDOCAINE HCL (PF) 2 % IJ SOLN
INTRAMUSCULAR | Status: AC
  Filled 2024-07-27: qty 5

## 2024-07-27 SURGICAL SUPPLY — 39 items
BNDG COHESIVE 4X5 TAN STRL LF (GAUZE/BANDAGES/DRESSINGS) ×1 IMPLANT
BNDG GAUZE DERMACEA FLUFF 4 (GAUZE/BANDAGES/DRESSINGS) ×1 IMPLANT
BRUSH SCRUB EZ 4% CHG (MISCELLANEOUS) ×1 IMPLANT
CHLORAPREP W/TINT 26 (MISCELLANEOUS) ×1 IMPLANT
DERMABOND ADVANCED .7 DNX12 (GAUZE/BANDAGES/DRESSINGS) ×1 IMPLANT
DRAPE SPINE LEICA/WILD 54X150 (DRAPES) ×1 IMPLANT
DRAPE SURG ORHT 6 SPLT 77X108 (DRAPES) IMPLANT
DRSG OPSITE POSTOP 4X6 (GAUZE/BANDAGES/DRESSINGS) IMPLANT
DRSG OPSITE POSTOP 4X8 (GAUZE/BANDAGES/DRESSINGS) IMPLANT
FEE INTRAOP CADWELL SUPPLY NCS (MISCELLANEOUS) IMPLANT
FEE SPECIALIZED CASE TRAD NCS (MISCELLANEOUS) IMPLANT
FORCEPS JEWEL BIP 4-3/4 STR (INSTRUMENTS) ×1 IMPLANT
GAUZE 4X4 16PLY ~~LOC~~+RFID DBL (SPONGE) IMPLANT
GAUZE SPONGE 4X4 12PLY STRL (GAUZE/BANDAGES/DRESSINGS) ×1 IMPLANT
GLOVE BIOGEL PI IND STRL 8 (GLOVE) ×2 IMPLANT
GLOVE SURG SYN 7.5 PF PI (GLOVE) ×1 IMPLANT
GOWN SRG XL LVL 3 NONREINFORCE (GOWNS) ×1 IMPLANT
GOWN STRL REUS W/ TWL LRG LVL3 (GOWN DISPOSABLE) ×1 IMPLANT
GOWN STRL REUS W/ TWL XL LVL3 (GOWN DISPOSABLE) ×1 IMPLANT
KIT TURNOVER KIT A (KITS) ×1 IMPLANT
MANIFOLD NEPTUNE II (INSTRUMENTS) ×1 IMPLANT
NDL HYPO 25X1 1.5 SAFETY (NEEDLE) ×1 IMPLANT
NEEDLE HYPO 25X1 1.5 SAFETY (NEEDLE) ×1 IMPLANT
NS IRRIG 500ML POUR BTL (IV SOLUTION) ×1 IMPLANT
PACK EXTREMITY ARMC (MISCELLANEOUS) ×1 IMPLANT
PENCIL SMOKE EVACUATOR (MISCELLANEOUS) ×1 IMPLANT
PROBE MONO 100X0.75 ELECT 1.9M (MISCELLANEOUS) IMPLANT
SEALANT FIBRIN TSSL HEMOSTAT 2 (Miscellaneous) IMPLANT
SPONGE KITTNER 5P (MISCELLANEOUS) IMPLANT
STOCKINETTE IMPERVIOUS 9X36 MD (GAUZE/BANDAGES/DRESSINGS) ×1 IMPLANT
SURGIFLO W/THROMBIN 8M KIT (HEMOSTASIS) IMPLANT
SUT ETHILON 6 0 9-3 1X18 BLK (SUTURE) IMPLANT
SUT ETHILON 8 0 (SUTURE) ×1 IMPLANT
SUT STRATA 3-0 15 PS-2 (SUTURE) IMPLANT
SUT STRATA 3-0 15 RB-1.5 (SUTURE) IMPLANT
SUT VIC AB 2-0 SH 27XBRD (SUTURE) IMPLANT
SUT VIC AB 3-0 SH 27X BRD (SUTURE) IMPLANT
TAPE TRANSPORE STRL 2 31045 (GAUZE/BANDAGES/DRESSINGS) IMPLANT
TRAP FLUID SMOKE EVACUATOR (MISCELLANEOUS) ×1 IMPLANT

## 2024-07-27 NOTE — Interval H&P Note (Signed)
 History and Physical Interval Note:  07/27/2024 6:59 AM  Eileen Chaney  has presented today for surgery, with the diagnosis of Severe Left Upper Extremity Weakness.  The various methods of treatment have been discussed with the patient and family. After consideration of risks, benefits and other options for treatment, the patient has consented to  Procedure(s): Ulnar Nerve to Musculocutaneous Nerve Transfer, Radial To Axillary Nerve Transfer, Possible End to Side (Left) as a surgical intervention.  The patient's history has been reviewed, patient examined, no change in status, stable for surgery.  I have reviewed the patient's chart and labs.  Questions were answered to the patient's satisfaction.    Heart and lungs clear   Penne LELON Sharps

## 2024-07-27 NOTE — Op Note (Signed)
 Indications: 48 year old woman with severe left upper extremity weakness in the setting of a recent posterior cervical decompression and fusion.  She developed a severe deficit bilaterally postop thankfully had an improvement in her right side but her left-sided deficits continue to be quite severe in her deltoid and biceps.  Because of this we planned for nerve transfer for reanimation  Findings: No evidence of stimulation in the anterior lateral branch of the axillary nerve, no evidence of stimulation in the biceps branch of the musculocutaneous nerve good size match for donor and recipient nerve  Preoperative Diagnosis: Severe Left Upper Extremity Weakness  Postoperative Diagnosis: Severe Left Upper Extremity Weakness   EBL: Minimal IVF: See anesthesia report Drains: none Disposition:Stable to PACU Complications: none  No foley catheter was placed.   Preoperative Note: Patient had a severe bilateral upper extremity deficit after a posterior cervical decompression and fusion.  Her left side continue to have severe deficits without any evidence of recovery in the deltoid and bicep.  Because of this and no evidence of active reinnervation we planned for a exploration and nerve transfer.  Risk of surgery is discussed and include: Infection, bleeding, wound healing issues, nerve injury, pain, failure to relieve the symptoms, need for further surgery.  Procedure:  1) radial (triceps branch) to axillary (anterior lateral branch) nerve transfer, end to end 2) ulnar fascicle to biceps branch nerve transfer    Procedure: After obtaining informed consent, the patient taken to the operating room, placed in supine position, monitored anesthesia care was induced.  They were given preoperative antibiotics.  Prepped and draped in the usual fashion.  Comprehensive timeout was performed verifying the patient's name, MRN, planned procedure.  We started with the radial to axillary portion of the  procedure.  A linear incision along imaginary line from the spine of the scapula to the olecranon was utilized to plantar incision.  We centered the incision over the posterior lateral edge of the deltoid above the quadrangular space.  We dissected down to the level of the fascia and opened this sharply.  We are able to identify the interval between the heads of the triceps.  This was split bluntly without any muscle division.  We are able to follow this down to the humerus in which we are able to find the radial neurovascular bundle.  We placed a self-retaining retractor at this point with no evidence of compression of any neurovascular tissue.  We then continued to move proximally until we explored and were able to identify the teres major.  As we continued to move more proximally we were able to find the quadrangular space.  We traced a sensory branch from the axillary sensory nerve down into the quadrangular space and were able to identify the posterior branch of the axillary nerve to the sensory branch and the posterior deltoid.  We then continued to dissect and were able to identify the anterior lateral branch of the axillary nerve to the deltoid.  We utilized direct stimulation and were not able to identify any active fascicular function or muscle contraction at this level.  Because of this we decided to do an end to end nerve transfer.  We dissected a branch of the radial nerve that had no evidence of innervation to the wrist extensors or hand.  This was pure triceps at this point and we dissected this as distal as possible so that we could reflect it back to connected to the proximal stump of the deltoid nerve.  Utilizing  an 8-0 suture and a microscopic magnification we did direct end-to-end coaptation that was under no tension.  We then used tissue glue and irrigated.  We obtained meticulous hemostasis.  We then closed in multiple layers.  We then turned our attention to ulnar to musculocutaneous portion  of this procedure.  The arm was placed onto an armboard and a incision was fashioned over the medial portion of the arm above the intermuscular septum.  This was infiltrated with local anesthetic.  It was dissected sharply.  We are able to follow this down to the fascia and identified the intermuscular septum.  We are able to open this fascia sharply and identified the biceps musculature as well as the neurovascular bundle.  We dissected out a clear branch of the musculocutaneous nerve to the biceps.  We were able to identify its insertion into the muscle without disrupting the neuromuscular junction.  We followed this back to show that it had connected to the musculocutaneous nerve proper.  Once we are able to identify this we then moved more towards the triceps side and the intermuscular septum and we will identify the ulnar nerve.  Utilizing stimulation to guide our internal neurolysis we dissected out a fascicle of the ulnar nerve which showed complete redundancy with the main branch.  Because of the redundancy we are able to utilize this as a donor.  We continued dissect this distally and as we divided there were able to see a clear fascicular structure.  We then moved this up towards the bicep in which we divided the proximal portion of the biceps branch of the musculocutaneous nerve.  We are able to reflect these down towards each other.  Again utilizing microscopic assistance we were able to use an 8-0 suture to do a direct end-to-end nerve transfer coaptation.  We then reinforced this with tissue glue.  We irrigated copiously and then we closed in multiple layers.  No immediate complications.  Sponge and pattie counts were correct at the end of the procedure.   Edsel Goods assisted in the procedure. An assistant was required for this procedure due to the complexity.  The assistant provided assistance in tissue manipulation and suction, and was required for the successful and safe performance of the  procedure. I performed the critical portions of the procedure.  Penne MICAEL Sharps, MD/MSCR

## 2024-07-27 NOTE — Anesthesia Postprocedure Evaluation (Signed)
 Anesthesia Post Note  Patient: Haly C Hopple  Procedure(s) Performed: Ulnar Nerve to Musculocutaneous Nerve Transfer, Radial To Axillary Nerve Transfer, Possible End to Side (Left: Arm Upper)  Patient location during evaluation: PACU Anesthesia Type: General Level of consciousness: awake and alert Pain management: pain level controlled Vital Signs Assessment: post-procedure vital signs reviewed and stable Respiratory status: spontaneous breathing, nonlabored ventilation and respiratory function stable Cardiovascular status: blood pressure returned to baseline and stable Postop Assessment: no apparent nausea or vomiting Anesthetic complications: no   No notable events documented.   Last Vitals:  Vitals:   07/27/24 1100 07/27/24 1147  BP: (!) 135/95 134/79  Pulse: 84 83  Resp: 18 16  Temp: (!) 36.2 C (!) 36.2 C  SpO2: 100% 100%    Last Pain:  Vitals:   07/27/24 1147  TempSrc: Temporal  PainSc:                  Camellia Merilee Louder

## 2024-07-27 NOTE — Anesthesia Preprocedure Evaluation (Addendum)
 Anesthesia Evaluation  Patient identified by MRN, date of birth, ID band Patient awake    Reviewed: Allergy & Precautions, H&P , NPO status , Patient's Chart, lab work & pertinent test results, reviewed documented beta blocker date and time   History of Anesthesia Complications (+) PONV, Family history of anesthesia reaction and history of anesthetic complications  Airway Mallampati: III  TM Distance: >3 FB Neck ROM: full  Mouth opening: Limited Mouth Opening  Dental  (+) Teeth Intact   Pulmonary asthma (reactive airway when uri) , sleep apnea , former smoker   Pulmonary exam normal        Cardiovascular Exercise Tolerance: Good hypertension, On Medications Normal cardiovascular exam Rhythm:regular Rate:Normal     Neuro/Psych  PSYCHIATRIC DISORDERS Anxiety Depression     Neuromuscular disease    GI/Hepatic negative GI ROS, Neg liver ROS,,,  Endo/Other  diabetes, Well Controlled, Type 2    Renal/GU negative Renal ROS  negative genitourinary   Musculoskeletal  (+) Arthritis ,  Fibromyalgia -  Abdominal   Peds  Hematology  (+) Blood dyscrasia, anemia   Anesthesia Other Findings Past Medical History: No date: Anxiety No date: Arthritis No date: Asthma No date: Cervical myelopathy with cervical radiculopathy (HCC) No date: Complication of anesthesia     Comment:  with c sections had a had time getting her sedated No date: DDD (degenerative disc disease), cervical No date: Depression No date: Diabetes mellitus without complication (HCC)     Comment:  type 2 No date: Family history of adverse reaction to anesthesia     Comment:  mother hard to wake up No date: Fibromyalgia No date: Hyperlipidemia No date: Hypertension No date: Idiopathic urticaria No date: Pneumonia No date: PONV (postoperative nausea and vomiting) 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 Past Surgical History: 2018: ABDOMINAL  HYSTERECTOMY     Comment:  uterine fibroids 2022: ANKLE SURGERY; Right     Comment:  ligament repair 11/12/2021: ANTERIOR CERVICAL DECOMP/DISCECTOMY FUSION; N/A     Comment:  Procedure: C3-5 ANTERIOR CERVICAL DISCECTOMY AND FUSION               (GLOBUS HEDRON);  Surgeon: Clois Fret, MD;                Location: [facility];  Service: Neurosurgery;  Laterality:               N/A; 01/20/2022: CERVICAL WOUND DEBRIDEMENT; N/A     Comment:  Procedure: posterior wound debridment;  Surgeon:               Clois Fret, MD;  Location: [facility];  Service:               Neurosurgery;  Laterality: N/A; No date: CESAREAN SECTION     Comment:  X3 2000, 2001, 2009 No date: DILATION AND CURETTAGE OF UTERUS 01/07/2022: POSTERIOR CERVICAL LAMINECTOMY; N/A     Comment:  Procedure: OPEN C5-7 POSTERIOR DECOMPRESSION;  Surgeon:               Clois Fret, MD;  Location: [facility];  Service:               Neurosurgery;  Laterality: N/A; 2000: TONSILLECTOMY No date: WISDOM TOOTH EXTRACTION BMI    Body Mass Index: 35.28 kg/m     Reproductive/Obstetrics negative OB ROS  Anesthesia Physical Anesthesia Plan  ASA: 3  Anesthesia Plan: General   Post-op Pain Management: Ofirmev  IV (intra-op)*, Toradol  IV (intra-op)* and Ketamine  IV*   Induction: Intravenous  PONV Risk Score and Plan: 4 or greater and Ondansetron , Dexamethasone  and TIVA  Airway Management Planned: Oral ETT  Additional Equipment:   Intra-op Plan:   Post-operative Plan: Extubation in OR  Informed Consent: I have reviewed the patients History and Physical, chart, labs and discussed the procedure including the risks, benefits and alternatives for the proposed anesthesia with the patient or authorized representative who has indicated his/her understanding and acceptance.     Dental Advisory Given  Plan Discussed with: CRNA  Anesthesia Plan Comments:          Anesthesia Quick Evaluation

## 2024-07-27 NOTE — Telephone Encounter (Signed)
 Dr. Claudene advised the patient to stop taking naltrexone (6 mg), which was originally prescribed by Dr. Tobie (Rheumatology) for fibromyalgia, pain, fatigue, and autoimmune arthritis. The patient is concerned about discontinuing this medication because it helps manage her symptoms and she fears a possible flare-up. She would like guidance as soon as possible on whether she should continue or stop taking it.

## 2024-07-27 NOTE — Discharge Instructions (Addendum)

## 2024-07-27 NOTE — H&P (Signed)
 Referring Physician:  No referring provider defined for this encounter.  Primary Physician:  Epifanio Alm SQUIBB, MD  History of Present Illness: 07/27/2024 Eileen Chaney is here today with a chief complaint of bilateral upper extremity weakness.  She has a history of cervical spondylosis and underwent a posterior cervical decompression and fusion.  She had severe bilateral upper extremity pain and weakness thankfully the right upper extremity has recovered significantly but still has some standing deficits.  On the left side she has severe deficits in the deltoid and external rotators as well as biceps.  She has been working with physical therapy to maintain her passive range of motion.  She has had an improvement in her motor function on the right.  On the left she has had some recovery of her left elbow flexion but continues to have significant weakness in her deltoid and external rotators.  She had a EMG done with Dr. Maree and is here to follow-up today.  Review of Systems:  A 10 point review of systems is negative, except for the pertinent positives and negatives detailed in the HPI.  Past Medical History: Past Medical History:  Diagnosis Date   Anxiety    Arthritis    Asthma    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, unspecified 03/14/2014   Brachial plexus lesion 06/14/2024   Cervical myelopathy with cervical radiculopathy (HCC)    Cervical radiculopathy 03/15/2024   Complication of anesthesia    with c sections had a had time getting her sedated   DDD (degenerative disc disease), cervical    Depression    Diabetes mellitus without complication (HCC)    type 2   Family history of adverse reaction to anesthesia    mother hard to wake up   Fibromyalgia    Hyperlipidemia    Hypertension    Idiopathic urticaria    Left arm weakness 06/14/2024   Nerve weakness 07/07/2024   Pneumonia    PONV (postoperative nausea and vomiting)    Sleep apnea     Spinal stenosis in cervical region 03/15/2024   Thalassemia 09/28/2017   Undifferentiated inflammatory arthritis    Upper extremity weakness 07/07/2024    Past Surgical History: Past Surgical History:  Procedure Laterality Date   ABDOMINAL HYSTERECTOMY  2018   uterine fibroids   ANKLE SURGERY Right 2022   ligament repair   ANTERIOR CERVICAL DECOMP/DISCECTOMY FUSION N/A 11/12/2021   Procedure: C3-5 ANTERIOR CERVICAL DISCECTOMY AND FUSION (GLOBUS HEDRON);  Surgeon: Clois Fret, MD;  Location: [facility];  Service: Neurosurgery;  Laterality: N/A;   CERVICAL WOUND DEBRIDEMENT N/A 01/20/2022   Procedure: posterior wound debridment;  Surgeon: Clois Fret, MD;  Location: [facility];  Service: Neurosurgery;  Laterality: N/A;   CESAREAN SECTION     X3 2000, 2001, 2009   DILATION AND CURETTAGE OF UTERUS     POSTERIOR CERVICAL FUSION/FORAMINOTOMY N/A 03/15/2024   Procedure: POSTERIOR CERVICAL FUSION/FORAMINOTOMY LEVEL 4;  Surgeon: Clois Fret, MD;  Location: [facility];  Service: Neurosurgery;  Laterality: N/A;  C4-7 Posterior Spinal Decompression C4-7 Posterior Spinal Instrumentation C5-7 Fusion   POSTERIOR CERVICAL LAMINECTOMY N/A 01/07/2022   Procedure: OPEN C5-7 POSTERIOR DECOMPRESSION;  Surgeon: Clois Fret, MD;  Location: [facility];  Service: Neurosurgery;  Laterality: N/A;   TONSILLECTOMY  2000   WISDOM TOOTH EXTRACTION      Allergies: Allergies as of 07/07/2024 - Review Complete 06/14/2024  Allergen Reaction Noted   Lactose Other (See Comments) 10/04/2022  Medications:  Current Facility-Administered Medications:    0.9 %  sodium chloride  infusion, , Intravenous, Continuous, Dario Barter, MD   ceFAZolin  (ANCEF ) IVPB 2g/100 mL premix, 2 g, Intravenous, Once, Claudene Penne ORN, MD  Social History: Social History   Tobacco Use   Smoking status: Former   Smokeless tobacco: Never  Vaping Use   Vaping status: Never Used  Substance Use Topics   Alcohol  use: Yes    Alcohol/week: 0.0 - 1.0 standard drinks of alcohol    Comment: rarely   Drug use: Not Currently    Family Medical History: History reviewed. No pertinent family history.  Physical Examination: Vitals:   07/27/24 0629  BP: 114/83  Pulse: 79  Resp: 16  Temp: (!) 96.9 F (36.1 C)  SpO2: 100%    General: Patient is in no apparent distress. Attention to examination is appropriate.  Neck:   Supple.  Full range of motion.  Respiratory: Patient is breathing without any difficulty.   NEUROLOGICAL:     Awake, alert, oriented to person, place, and time.  Speech is clear and fluent.   Cranial Nerves: Pupils equal round and reactive to light.  Facial tone is symmetric.  Facial sensation is symmetric. Shoulder shrug is symmetric. Tongue protrusion is midline.    Strength: Focused physical examination in the left upper extremity shows severe weakness in her left deltoid with no evidence of palpable activation, no evidence of palpable activation in the supra or infraspinatus.  She does have contraction of her trapezius muscles and levator.  She appears to be activating some of her rhomboid musculature.  Her biceps function is palpable, appears to be a 2 out of 5 as she can flex with gravity eliminated.  She does have some continued weakness with supination and does not have full range of motion.  Her brachial radialis does have some firing as well.  Her wrist extension continues to be quite strong at least 4+ out of 5.  Wrist flexion the same.  Hand grip the same.  Her elbow extension is at least 4+ out of 5.   Imaging: Cervical MRI demonstrates no areas of active ongoing compression.  I have personally reviewed the images and agree with the above interpretation.  I reviewed her EMG performed at the [facility].  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  The most severely impacted muscles were the deltoid with no evidence of motor unit recovery.  She did have  evidence of some motor unit recovery in her biceps.   Medical Decision Making/Assessment and Plan: Eileen Chaney is a pleasant 47 y.o. female with left upper extremity weakness in the setting of a previous cervical spine surgery.  Notably she has history of autoimmune disease and has had previous flares with steroid requirements.  She states that she woke up almost immediately with severe pain in her bilateral upper extremities and significant weakness.  They were evaluated for hardware positioning which appeared to be in good position with no evidence of foraminal breaches.  She has since had significant amount of physical therapy and has had some improvement in her biceps function nor elbow flexion function.  She is able to flex her elbow with gravity eliminated.  Unfortunately she continues to have severe deficits in her deltoids and external rotators.   Her EMG did demonstrate a plexus level lesion rather than a radicular level lesion, the etiology of C5/6 palsy postoperatively has not clearly been defined.  We plan for a radial to axillary  nerve transfer and a ulnar to musculocutaneous nerve transfer, possible end to side We did discuss that the outcomes for these are not perfect and that we would hope for some functional improvement but would not be expected to regain full strength or range of motion.  Thank you for involving me in the care of this patient.    Penne MICAEL Sharps MD/MSCR Neurosurgery

## 2024-07-27 NOTE — Anesthesia Procedure Notes (Signed)
 Procedure Name: Intubation Date/Time: 07/27/2024 7:29 AM  Performed by: Brien Sotero PARAS, CRNAPre-anesthesia Checklist: Patient identified, Patient being monitored, Timeout performed, Emergency Drugs available and Suction available Patient Re-evaluated:Patient Re-evaluated prior to induction Oxygen Delivery Method: Circle system utilized Preoxygenation: Pre-oxygenation with 100% oxygen Induction Type: IV induction Ventilation: Mask ventilation without difficulty Laryngoscope Size: 3 and McGrath Grade View: Grade I Tube type: Oral Tube size: 7.0 mm Number of attempts: 1 Airway Equipment and Method: Stylet Placement Confirmation: ETT inserted through vocal cords under direct vision, positive ETCO2 and breath sounds checked- equal and bilateral Secured at: 22 cm Tube secured with: Tape Dental Injury: Teeth and Oropharynx as per pre-operative assessment  Comments: Atraumatic

## 2024-07-27 NOTE — Telephone Encounter (Signed)
 I spoke with the patient and informed her not to take both medications at the same time because the naltrexone will prevent the oxycodone  from working and taking both together can have adverse effects. She verbalized understanding and is aware not to restart the naltrexone until she is done with oxycodone .   She inquired if she should restart the naltrexone at the original dose once she is done with the oxycodone . I advised her to reach out to Dr Tobie for guidance on this.

## 2024-07-27 NOTE — Transfer of Care (Addendum)
 Immediate Anesthesia Transfer of Care Note  Patient: Eileen Chaney  Procedure(s) Performed: Ulnar Nerve to Musculocutaneous Nerve Transfer, Radial To Axillary Nerve Transfer, Possible End to Side (Left: Arm Upper)  Patient Location: PACU  Anesthesia Type:General  Level of Consciousness: awake, alert , oriented, and patient cooperative  Airway & Oxygen Therapy: Patient Spontanous Breathing  Post-op Assessment: Report given to RN, Post -op Vital signs reviewed and stable, and Patient moving all extremities X 4  Post vital signs: Reviewed and stable  Last Vitals:  Vitals Value Taken Time  BP 122/81 07/27/24 10:15  Temp    Pulse 96 07/27/24 10:22  Resp 13 07/27/24 10:22  SpO2 100 % 07/27/24 10:22  Vitals shown include unfiled device data.  Last Pain:  Vitals:   07/27/24 0629  TempSrc: Tympanic  PainSc: 0-No pain         Complications: No notable events documented.

## 2024-07-28 ENCOUNTER — Encounter

## 2024-07-29 ENCOUNTER — Encounter

## 2024-07-29 ENCOUNTER — Encounter: Payer: Self-pay | Admitting: Neurosurgery

## 2024-07-29 ENCOUNTER — Other Ambulatory Visit: Payer: Self-pay | Admitting: Physician Assistant

## 2024-07-29 DIAGNOSIS — R29898 Other symptoms and signs involving the musculoskeletal system: Secondary | ICD-10-CM

## 2024-07-29 DIAGNOSIS — G540 Brachial plexus disorders: Secondary | ICD-10-CM

## 2024-08-02 ENCOUNTER — Other Ambulatory Visit: Payer: Self-pay | Admitting: Infectious Diseases

## 2024-08-02 DIAGNOSIS — Z1231 Encounter for screening mammogram for malignant neoplasm of breast: Secondary | ICD-10-CM

## 2024-08-03 ENCOUNTER — Ambulatory Visit

## 2024-08-05 ENCOUNTER — Ambulatory Visit

## 2024-08-06 ENCOUNTER — Encounter: Payer: Self-pay | Admitting: Neurosurgery

## 2024-08-10 ENCOUNTER — Ambulatory Visit

## 2024-08-10 ENCOUNTER — Ambulatory Visit: Admitting: Physician Assistant

## 2024-08-10 ENCOUNTER — Other Ambulatory Visit: Payer: Self-pay | Admitting: Physician Assistant

## 2024-08-10 ENCOUNTER — Other Ambulatory Visit: Payer: Self-pay | Admitting: Infectious Diseases

## 2024-08-10 ENCOUNTER — Other Ambulatory Visit: Payer: Self-pay | Admitting: Family Medicine

## 2024-08-10 ENCOUNTER — Encounter: Payer: Self-pay | Admitting: Neurosurgery

## 2024-08-10 VITALS — BP 114/72 | Temp 98.1°F | Ht 68.0 in | Wt 233.0 lb

## 2024-08-10 DIAGNOSIS — G959 Disease of spinal cord, unspecified: Secondary | ICD-10-CM

## 2024-08-10 DIAGNOSIS — G540 Brachial plexus disorders: Secondary | ICD-10-CM

## 2024-08-10 DIAGNOSIS — Z981 Arthrodesis status: Secondary | ICD-10-CM

## 2024-08-10 DIAGNOSIS — N6310 Unspecified lump in the right breast, unspecified quadrant: Secondary | ICD-10-CM

## 2024-08-10 MED ORDER — CELECOXIB 200 MG PO CAPS: 200.0000 mg | ORAL_CAPSULE | Freq: Every day | ORAL | 0 refills | Status: DC

## 2024-08-10 NOTE — Progress Notes (Signed)
   REFERRING PHYSICIAN:  Epifanio Alm SQUIBB, Md [address] [place],  [place] [address]  DOS: 07/27/24, left upper extremity ulnar nerve and musculocutaneous nerve transfer and left radial to axillary nerve transfer 03/15/24, C3-7 PSF, C4-6 decompression  HISTORY OF PRESENT ILLNESS: Ledora C Munnerlyn is 5 months status post C3-7 PSF and C4 -6 decompression that was complicated by new onset left upper extremity weakness.  She is now 2 weeks status post left ulnar to musculocutaneous nerve transfer and left radial to axillary nerve transfer.  She continues to have pain in her neck and left arm feels like a deep muscle ache and stiffness.  She does not take Lyrica , Tylenol , and ibuprofen  but does not feel as though her pain has been under control the past couple of days.    PHYSICAL EXAMINATION:  NEUROLOGICAL:  General: In no acute distress.   Awake, alert, oriented to person, place, and time.  Pupils equal round and reactive to light.  Facial tone is symmetric.    Strength: Patient's incisions are well-appearing.  No erythema or drainage.  She continues to have significant weakness in her left deltoid and biceps.  She continues to improve distally and is at least a 4/4+ out of 5   Imaging:  No new interval imaging  Assessment / Plan: FAHMIDA JURICH is 5 months status post C3-7 PSF and C4-6 decompression that was complicated by new onset left upper extremity weakness.  She is now 2 weeks status post left ulnar to musculocutaneous nerve transfer and left radial to axillary nerve transfer.  We discussed activity escalation and use of her sling as needed for pain control.  Plan to add in Celebrex  to help with her pain as she felt as though the tramadol was too strong.  Continue to advise patient of 10 pound limit for lifting.  Will see back in approximately 1 month for 6-week postop visit.  Advised to contact the office if any questions or concerns arise.   Lyle Decamp  PA-C Dept of Neurosurgery

## 2024-08-12 ENCOUNTER — Ambulatory Visit

## 2024-08-12 ENCOUNTER — Encounter: Admitting: Neurosurgery

## 2024-08-16 ENCOUNTER — Inpatient Hospital Stay: Admission: RE | Admit: 2024-08-16 | Source: Ambulatory Visit

## 2024-08-16 ENCOUNTER — Other Ambulatory Visit

## 2024-08-17 ENCOUNTER — Ambulatory Visit

## 2024-08-19 ENCOUNTER — Ambulatory Visit

## 2024-08-24 ENCOUNTER — Inpatient Hospital Stay: Admission: RE | Admit: 2024-08-24 | Source: Ambulatory Visit

## 2024-08-24 ENCOUNTER — Other Ambulatory Visit

## 2024-08-24 ENCOUNTER — Ambulatory Visit

## 2024-08-31 ENCOUNTER — Ambulatory Visit

## 2024-09-01 ENCOUNTER — Ambulatory Visit: Admitting: Occupational Therapy

## 2024-09-02 ENCOUNTER — Ambulatory Visit

## 2024-09-06 ENCOUNTER — Encounter: Admitting: Neurosurgery

## 2024-09-06 ENCOUNTER — Other Ambulatory Visit: Payer: Self-pay | Admitting: Physician Assistant

## 2024-09-06 MED ORDER — CELECOXIB 200 MG PO CAPS: 200.0000 mg | ORAL_CAPSULE | Freq: Every day | ORAL | 0 refills | Status: DC

## 2024-09-06 MED ORDER — TIZANIDINE HCL 4 MG PO TABS: 4.0000 mg | ORAL_TABLET | Freq: Three times a day (TID) | ORAL | 1 refills | Status: AC | PRN

## 2024-09-07 ENCOUNTER — Ambulatory Visit

## 2024-09-09 ENCOUNTER — Ambulatory Visit

## 2024-09-14 ENCOUNTER — Ambulatory Visit

## 2024-09-15 ENCOUNTER — Encounter: Admitting: Neurosurgery

## 2024-09-16 ENCOUNTER — Ambulatory Visit

## 2024-09-20 ENCOUNTER — Other Ambulatory Visit

## 2024-09-20 ENCOUNTER — Inpatient Hospital Stay: Admission: RE | Admit: 2024-09-20 | Source: Ambulatory Visit

## 2024-09-21 ENCOUNTER — Ambulatory Visit

## 2024-09-28 ENCOUNTER — Ambulatory Visit

## 2024-10-05 ENCOUNTER — Ambulatory Visit

## 2024-10-07 ENCOUNTER — Ambulatory Visit

## 2024-10-12 ENCOUNTER — Ambulatory Visit
Admission: RE | Admit: 2024-10-12 | Discharge: 2024-10-12 | Disposition: A | Discharge status: Home / Self Care | Source: Ambulatory Visit | Attending: Infectious Diseases | Admitting: Infectious Diseases

## 2024-10-12 ENCOUNTER — Encounter

## 2024-10-12 ENCOUNTER — Inpatient Hospital Stay
Admission: RE | Admit: 2024-10-12 | Discharge: 2024-10-12 | Disposition: A | Source: Ambulatory Visit | Attending: Infectious Diseases | Admitting: Infectious Diseases

## 2024-10-12 DIAGNOSIS — N6310 Unspecified lump in the right breast, unspecified quadrant: Secondary | ICD-10-CM | POA: Insufficient documentation

## 2024-10-14 ENCOUNTER — Encounter: Payer: Self-pay | Admitting: Neurosurgery

## 2024-10-14 ENCOUNTER — Ambulatory Visit: Admitting: Neurosurgery

## 2024-10-14 ENCOUNTER — Encounter

## 2024-10-14 VITALS — BP 108/76 | Temp 98.2°F | Ht 68.0 in | Wt 210.0 lb

## 2024-10-14 DIAGNOSIS — G540 Brachial plexus disorders: Secondary | ICD-10-CM

## 2024-10-14 DIAGNOSIS — G959 Disease of spinal cord, unspecified: Secondary | ICD-10-CM

## 2024-10-14 DIAGNOSIS — R29898 Other symptoms and signs involving the musculoskeletal system: Secondary | ICD-10-CM

## 2024-10-14 NOTE — Progress Notes (Signed)
" ° °  REFERRING PHYSICIAN:  Epifanio Alm SQUIBB, Md [address] [place],  [place] [address]  DOS: 07/27/24, left upper extremity ulnar nerve and musculocutaneous nerve transfer and left radial to axillary nerve transfer 03/15/24, C3-7 PSF, C4-6 decompression  HISTORY OF PRESENT ILLNESS: 10/15/2023 Eileen Chaney is a 49 y.o presenting today for 3 month follow up of her recent nerve transfer. She is doing well despite some ongoing left proximal arm pain. She states this is less shooting than prior to surgery and is rather more constant and dull. She feels her strength has improved since her last visit. She is to start back with PT next week.   08/10/2024  Eileen Chaney is 5 months status post C3-7 PSF and C4 -6 decompression that was complicated by new onset left upper extremity weakness.  She is now 2 weeks status post left ulnar to musculocutaneous nerve transfer and left radial to axillary nerve transfer.  She continues to have pain in her neck and left arm feels like a deep muscle ache and stiffness.  She does not take Lyrica , Tylenol , and ibuprofen  but does not feel as though her pain has been under control the past couple of days.    PHYSICAL EXAMINATION:  NEUROLOGICAL:  General: In no acute distress.   Awake, alert, oriented to person, place, and time.  Pupils equal round and reactive to light.  Facial tone is symmetric.    Strength: Patient's incisions are well-appearing.  No erythema or drainage.  3/5 left deltoid 4-/5 left bicep 4+/5 left triceps.  She continues to improve distally and is at least a 4/4+ out of 5  Imaging:  No new interval imaging  Assessment / Plan: Eileen Chaney is 5 months status post C3-7 PSF and C4-6 decompression that was complicated by new onset left upper extremity weakness.  She is now about 3 months status post left ulnar to musculocutaneous nerve transfer and left radial to axillary nerve transfer.   Her strength is improved significantly  but she does have some degreed range of motion in her left shoulder and some pain with passive range of motion. I think she would benefit from ongoing PT focused on ROM and strengthening her proximal arm. She is now outside of the window for needed restrictions from a surgical standpoint.I recommended she avoid anything that causes more than discomfort when pursuing ROM.  She will keep her follow up regarding her cervical spine with Dr. Clois next month and we will get her scheduled to return to see Dr. Claudene in about 6 months from now. She was encouraged to contact us  in the interim with any questions or concerns  Edsel Goods PA-C Dept of Neurosurgery   "

## 2024-10-19 ENCOUNTER — Encounter

## 2024-10-21 ENCOUNTER — Encounter

## 2024-10-26 ENCOUNTER — Encounter

## 2024-10-28 ENCOUNTER — Encounter

## 2024-11-02 ENCOUNTER — Encounter

## 2024-11-03 ENCOUNTER — Ambulatory Visit: Admitting: Adult Health

## 2024-11-03 ENCOUNTER — Other Ambulatory Visit: Payer: Self-pay

## 2024-11-03 ENCOUNTER — Encounter: Payer: Self-pay | Admitting: Adult Health

## 2024-11-03 VITALS — BP 110/80 | HR 99 | Temp 97.5°F | Ht 68.0 in | Wt 209.0 lb

## 2024-11-03 DIAGNOSIS — S0990XA Unspecified injury of head, initial encounter: Secondary | ICD-10-CM

## 2024-11-03 NOTE — Addendum Note (Signed)
 Addended by: NEWCOMER MCCLAIN, Gifford Ballon L on: 11/03/2024 11:17 AM   Modules accepted: Orders
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
## 2024-11-04 ENCOUNTER — Other Ambulatory Visit: Payer: Self-pay | Admitting: Physician Assistant

## 2024-11-04 ENCOUNTER — Encounter

## 2024-11-09 ENCOUNTER — Ambulatory Visit: Admitting: Neurosurgery

## 2024-11-09 ENCOUNTER — Other Ambulatory Visit

## 2024-11-09 ENCOUNTER — Encounter

## 2024-11-11 ENCOUNTER — Encounter

## 2024-11-16 ENCOUNTER — Encounter

## 2024-11-18 ENCOUNTER — Encounter

## 2024-11-23 ENCOUNTER — Encounter

## 2024-11-25 ENCOUNTER — Encounter

## 2024-11-30 ENCOUNTER — Encounter

## 2024-12-02 ENCOUNTER — Encounter

## 2024-12-07 ENCOUNTER — Encounter

## 2024-12-09 ENCOUNTER — Encounter
